# Patient Record
Sex: Male | Born: 1935 | Race: Black or African American | Hispanic: No | Marital: Married | Smoking: Former smoker
Health system: Southern US, Community
[De-identification: ages and names within clinical notes are randomized; demographics above are authoritative.]

## PROBLEM LIST (undated history)

## (undated) DIAGNOSIS — I10 Essential (primary) hypertension: Secondary | ICD-10-CM

## (undated) DIAGNOSIS — M1990 Unspecified osteoarthritis, unspecified site: Secondary | ICD-10-CM

## (undated) DIAGNOSIS — N179 Acute kidney failure, unspecified: Secondary | ICD-10-CM

## (undated) DIAGNOSIS — R4182 Altered mental status, unspecified: Secondary | ICD-10-CM

## (undated) DIAGNOSIS — K635 Polyp of colon: Secondary | ICD-10-CM

## (undated) DIAGNOSIS — N529 Male erectile dysfunction, unspecified: Secondary | ICD-10-CM

## (undated) DIAGNOSIS — Z9109 Other allergy status, other than to drugs and biological substances: Secondary | ICD-10-CM

## (undated) DIAGNOSIS — Z5189 Encounter for other specified aftercare: Secondary | ICD-10-CM

## (undated) DIAGNOSIS — H409 Unspecified glaucoma: Secondary | ICD-10-CM

## (undated) HISTORY — PX: GLAUCOMA VALVE INSERTION: SHX5297

## (undated) HISTORY — PX: EYE SURGERY: SHX253

## (undated) HISTORY — PX: TOENAIL EXCISION: SHX183

## (undated) HISTORY — PX: JOINT REPLACEMENT: SHX530

## (undated) HISTORY — PX: CATARACT EXTRACTION: SUR2

---

## 1999-02-26 ENCOUNTER — Encounter: Payer: Self-pay | Admitting: Family Medicine

## 1999-02-26 ENCOUNTER — Ambulatory Visit (HOSPITAL_COMMUNITY): Admission: RE | Admit: 1999-02-26 | Discharge: 1999-02-26 | Payer: Self-pay | Admitting: Family Medicine

## 1999-03-05 ENCOUNTER — Ambulatory Visit (HOSPITAL_COMMUNITY): Admission: RE | Admit: 1999-03-05 | Discharge: 1999-03-05 | Payer: Self-pay | Admitting: Family Medicine

## 1999-03-05 ENCOUNTER — Encounter: Payer: Self-pay | Admitting: Family Medicine

## 2001-08-11 ENCOUNTER — Ambulatory Visit (HOSPITAL_BASED_OUTPATIENT_CLINIC_OR_DEPARTMENT_OTHER): Admission: RE | Admit: 2001-08-11 | Discharge: 2001-08-11 | Payer: Self-pay | Admitting: Orthopedic Surgery

## 2003-11-04 DIAGNOSIS — IMO0001 Reserved for inherently not codable concepts without codable children: Secondary | ICD-10-CM

## 2003-11-04 DIAGNOSIS — Z5189 Encounter for other specified aftercare: Secondary | ICD-10-CM

## 2003-11-04 HISTORY — PX: HIP ARTHROPLASTY: SHX981

## 2003-11-04 HISTORY — DX: Reserved for inherently not codable concepts without codable children: IMO0001

## 2003-11-04 HISTORY — DX: Encounter for other specified aftercare: Z51.89

## 2004-01-04 ENCOUNTER — Ambulatory Visit (HOSPITAL_COMMUNITY): Admission: RE | Admit: 2004-01-04 | Discharge: 2004-01-04 | Payer: Self-pay | Admitting: Family Medicine

## 2004-05-30 ENCOUNTER — Ambulatory Visit (HOSPITAL_COMMUNITY): Admission: RE | Admit: 2004-05-30 | Discharge: 2004-05-30 | Payer: Self-pay | Admitting: Family Medicine

## 2004-07-22 ENCOUNTER — Inpatient Hospital Stay (HOSPITAL_COMMUNITY): Admission: RE | Admit: 2004-07-22 | Discharge: 2004-07-24 | Payer: Self-pay | Admitting: Orthopedic Surgery

## 2010-11-03 HISTORY — PX: MULTIPLE TOOTH EXTRACTIONS: SHX2053

## 2011-11-25 ENCOUNTER — Other Ambulatory Visit: Payer: Self-pay | Admitting: Orthopedic Surgery

## 2011-12-02 ENCOUNTER — Encounter (HOSPITAL_COMMUNITY): Payer: Self-pay | Admitting: Pharmacy Technician

## 2011-12-08 ENCOUNTER — Encounter (HOSPITAL_COMMUNITY)
Admission: RE | Admit: 2011-12-08 | Discharge: 2011-12-08 | Disposition: A | Discharge status: Home / Self Care | Payer: Medicare Other | Source: Ambulatory Visit | Attending: Orthopedic Surgery | Admitting: Orthopedic Surgery

## 2011-12-08 ENCOUNTER — Encounter (HOSPITAL_COMMUNITY): Payer: Self-pay

## 2011-12-08 HISTORY — DX: Male erectile dysfunction, unspecified: N52.9

## 2011-12-08 HISTORY — DX: Other allergy status, other than to drugs and biological substances: Z91.09

## 2011-12-08 HISTORY — DX: Unspecified osteoarthritis, unspecified site: M19.90

## 2011-12-08 HISTORY — DX: Polyp of colon: K63.5

## 2011-12-08 HISTORY — DX: Unspecified glaucoma: H40.9

## 2011-12-08 HISTORY — DX: Encounter for other specified aftercare: Z51.89

## 2011-12-08 LAB — URINALYSIS, ROUTINE W REFLEX MICROSCOPIC
Glucose, UA: NEGATIVE mg/dL
Leukocytes, UA: NEGATIVE
Protein, ur: NEGATIVE mg/dL
Specific Gravity, Urine: 1.025 (ref 1.005–1.030)
Urobilinogen, UA: 0.2 mg/dL (ref 0.0–1.0)

## 2011-12-08 LAB — SURGICAL PCR SCREEN
MRSA, PCR: NEGATIVE
Staphylococcus aureus: POSITIVE — AB

## 2011-12-08 LAB — CBC
HCT: 44.7 % (ref 39.0–52.0)
Hemoglobin: 14.6 g/dL (ref 13.0–17.0)
MCH: 29.5 pg (ref 26.0–34.0)
MCHC: 32.7 g/dL (ref 30.0–36.0)
MCV: 90.3 fL (ref 78.0–100.0)
RDW: 13.3 % (ref 11.5–15.5)

## 2011-12-08 LAB — TYPE AND SCREEN: ABO/RH(D): B POS

## 2011-12-08 LAB — BASIC METABOLIC PANEL
BUN: 9 mg/dL (ref 6–23)
CO2: 23 mEq/L (ref 19–32)
Chloride: 108 mEq/L (ref 96–112)
Creatinine, Ser: 0.82 mg/dL (ref 0.50–1.35)
GFR calc Af Amer: >90 mL/min (ref >90)
Glucose, Bld: 94 mg/dL (ref 70–99)
Potassium: 4.5 mEq/L (ref 3.5–5.1)

## 2011-12-08 LAB — DIFFERENTIAL
Basophils Absolute: 0 10*3/uL (ref 0.0–0.1)
Basophils Relative: 1 % (ref 0–1)
Eosinophils Relative: 8 % — ABNORMAL HIGH (ref 0–5)
Monocytes Absolute: 0.6 10*3/uL (ref 0.1–1.0)
Monocytes Relative: 9 % (ref 3–12)
Neutro Abs: 3.5 10*3/uL (ref 1.7–7.7)

## 2011-12-08 NOTE — Pre-Procedure Instructions (Signed)
20 RODRICKUS MIN  12/08/2011   Your procedure is scheduled on:  *Monday, Feb 11**  Report to [facility] at *0900** AM.  Call this number if you have problems the morning of surgery: [phone]   Remember:   Do not eat food:After Midnight.  May have clear liquids: up to 4 Hours before arrival.  Clear liquids include soda, tea, black coffee, apple or grape juice, broth.  Take these medicines the morning of surgery with A SIP OF WATER: *Eye drops,Baclofen,Parafon,Neurontin,Hydrocodone**   Do not wear jewelry, make-up or nail polish.  Do not wear lotions, powders, or perfumes. You may wear deodorant.  Do not shave 48 hours prior to surgery.  Do not bring valuables to the hospital.  Contacts, dentures or bridgework may not be worn into surgery.  Leave suitcase in the car. After surgery it may be brought to your room.  For patients admitted to the hospital, checkout time is 11:00 AM the day of discharge.   Patients discharged the day of surgery will not be allowed to drive home.  Name and phone number of your driver: *n/a**  Special Instructions: Incentive Spirometry - Practice and bring it with you on the day of surgery. and CHG Shower Use Special Wash: 1/2 bottle night before surgery and 1/2 bottle morning of surgery.   Please read over the following fact sheets that you were given: Pain Booklet, Coughing and Deep Breathing, Blood Transfusion Information, Total Joint Packet, MRSA Information and Surgical Site Infection Prevention

## 2011-12-12 NOTE — H&P (Signed)
  HISTORY OF PRESENT ILLNESS:  Mr. Nathaniel [name] comes in today reporting that the Supartz injections into his right knee have really not helped him.  He is pretty much at his wits end and he would like to go ahead and proceed with right total knee arthroplasty for his valgus deformity that is bone-on-bone and is documented by x-rays that were done over a year ago.  His revision right total hip continues to perform well and causes him no discomfort at all.  PAST MEDICAL HISTORY: HTN, BPH, arthritis PSH: THA Family Hx: Non-contributory Social Hx: Denies EtOH or tobacco  Review of systems reviewed thoroughly and all other systems are negative as related to the chief complaint.  OBJECTIVE:  Well-nourished, well-developed 77 year old gentleman seated on the exam table in no apparent distress.  No shortness of breath.  The right knee has an obvious valgus deformity of about ten degrees, 1+ effusion, tender along the lateral joint line.  Range of motion is 5 to about 115 degrees, and he remains neurovascularly intact.  I did not repeat his x-rays.  IMPRESSION:  End-stage arthritis of the right knee having failed conservative measures over the last few years including exercise, physical therapy, cortisone injections, anti-inflammatory medicines, and most recently visco supplementation.  PLAN:  Per his request, we will get him set up for right total knee arthroplasty.  Risks and benefits of surgery was discussed.  Questions were answered and I will see him back at the time of surgical intervention.

## 2011-12-14 MED ORDER — DEXTROSE-NACL 5-0.45 % IV SOLN: INTRAVENOUS | Status: DC

## 2011-12-14 MED ORDER — CHLORHEXIDINE GLUCONATE 4 % EX LIQD
60.0000 mL | Freq: Once | CUTANEOUS | Status: DC
  Filled 2011-12-14: qty 60

## 2011-12-14 MED ORDER — CEFAZOLIN SODIUM 1-5 GM-% IV SOLN
1.0000 g | INTRAVENOUS | Status: AC
  Administered 2011-12-15: 1 g via INTRAVENOUS
  Filled 2011-12-14: qty 50

## 2011-12-15 ENCOUNTER — Encounter (HOSPITAL_COMMUNITY): Payer: Self-pay | Admitting: Anesthesiology

## 2011-12-15 ENCOUNTER — Encounter (HOSPITAL_COMMUNITY): Payer: Self-pay | Admitting: *Deleted

## 2011-12-15 ENCOUNTER — Other Ambulatory Visit: Payer: Self-pay

## 2011-12-15 ENCOUNTER — Inpatient Hospital Stay (HOSPITAL_COMMUNITY)
Admission: RE | Admit: 2011-12-15 | Discharge: 2011-12-19 | DRG: 470 | Disposition: A | Discharge status: Home Health | Payer: Medicare Other | Source: Ambulatory Visit | Attending: Orthopedic Surgery | Admitting: Orthopedic Surgery

## 2011-12-15 ENCOUNTER — Encounter (HOSPITAL_COMMUNITY)
Admission: RE | Disposition: A | Discharge status: Home Health | Payer: Self-pay | Source: Ambulatory Visit | Attending: Orthopedic Surgery

## 2011-12-15 ENCOUNTER — Encounter (HOSPITAL_COMMUNITY): Payer: Self-pay

## 2011-12-15 ENCOUNTER — Ambulatory Visit (HOSPITAL_COMMUNITY): Payer: Medicare Other | Admitting: Anesthesiology

## 2011-12-15 DIAGNOSIS — Z01812 Encounter for preprocedural laboratory examination: Secondary | ICD-10-CM

## 2011-12-15 DIAGNOSIS — N400 Enlarged prostate without lower urinary tract symptoms: Secondary | ICD-10-CM | POA: Diagnosis present

## 2011-12-15 DIAGNOSIS — I10 Essential (primary) hypertension: Secondary | ICD-10-CM | POA: Diagnosis present

## 2011-12-15 DIAGNOSIS — Z96649 Presence of unspecified artificial hip joint: Secondary | ICD-10-CM

## 2011-12-15 DIAGNOSIS — Z79899 Other long term (current) drug therapy: Secondary | ICD-10-CM

## 2011-12-15 DIAGNOSIS — M1710 Unilateral primary osteoarthritis, unspecified knee: Principal | ICD-10-CM | POA: Diagnosis present

## 2011-12-15 DIAGNOSIS — Z860100 Personal history of colon polyps, unspecified: Secondary | ICD-10-CM

## 2011-12-15 DIAGNOSIS — M129 Arthropathy, unspecified: Secondary | ICD-10-CM | POA: Diagnosis present

## 2011-12-15 DIAGNOSIS — M1711 Unilateral primary osteoarthritis, right knee: Secondary | ICD-10-CM | POA: Diagnosis present

## 2011-12-15 DIAGNOSIS — H548 Legal blindness, as defined in USA: Secondary | ICD-10-CM | POA: Diagnosis present

## 2011-12-15 DIAGNOSIS — H409 Unspecified glaucoma: Secondary | ICD-10-CM | POA: Diagnosis present

## 2011-12-15 HISTORY — PX: TOTAL KNEE ARTHROPLASTY: SHX125

## 2011-12-15 SURGERY — ARTHROPLASTY, KNEE, TOTAL
Anesthesia: General | Site: Knee | Laterality: Right | Wound class: Clean

## 2011-12-15 MED ORDER — MENTHOL 3 MG MT LOZG: 1.0000 | LOZENGE | OROMUCOSAL | Status: DC | PRN

## 2011-12-15 MED ORDER — MAGNESIUM HYDROXIDE 400 MG/5ML PO SUSP
30.0000 mL | Freq: Every day | ORAL | Status: DC | PRN
  Administered 2011-12-18: 30 mL via ORAL
  Filled 2011-12-15: qty 30

## 2011-12-15 MED ORDER — METOCLOPRAMIDE HCL 5 MG/ML IJ SOLN
5.0000 mg | Freq: Three times a day (TID) | INTRAMUSCULAR | Status: DC | PRN
  Filled 2011-12-15: qty 2

## 2011-12-15 MED ORDER — FENTANYL CITRATE 0.05 MG/ML IJ SOLN
50.0000 ug | INTRAMUSCULAR | Status: DC | PRN
  Administered 2011-12-15: 100 ug via INTRAVENOUS

## 2011-12-15 MED ORDER — ZOLPIDEM TARTRATE 5 MG PO TABS: 5.0000 mg | ORAL_TABLET | Freq: Every evening | ORAL | Status: DC | PRN

## 2011-12-15 MED ORDER — ALUM & MAG HYDROXIDE-SIMETH 200-200-20 MG/5ML PO SUSP: 30.0000 mL | ORAL | Status: DC | PRN

## 2011-12-15 MED ORDER — FLEET ENEMA 7-19 GM/118ML RE ENEM: 1.0000 | ENEMA | Freq: Once | RECTAL | Status: AC | PRN

## 2011-12-15 MED ORDER — HYDROCODONE-ACETAMINOPHEN 5-325 MG PO TABS
1.0000 | ORAL_TABLET | ORAL | Status: DC | PRN
  Administered 2011-12-15 – 2011-12-16 (×3): 2 via ORAL
  Administered 2011-12-17: 1 via ORAL
  Administered 2011-12-18 (×2): 2 via ORAL
  Administered 2011-12-18: 1 via ORAL
  Administered 2011-12-18 – 2011-12-19 (×2): 2 via ORAL
  Filled 2011-12-15: qty 2
  Filled 2011-12-15: qty 1
  Filled 2011-12-15 (×3): qty 2
  Filled 2011-12-15: qty 1
  Filled 2011-12-15 (×4): qty 2

## 2011-12-15 MED ORDER — MIDAZOLAM HCL 2 MG/2ML IJ SOLN
INTRAMUSCULAR | Status: AC
  Filled 2011-12-15: qty 2

## 2011-12-15 MED ORDER — DORZOLAMIDE HCL-TIMOLOL MAL 2-0.5 % OP SOLN
1.0000 [drp] | Freq: Two times a day (BID) | OPHTHALMIC | Status: DC
  Administered 2011-12-15 – 2011-12-19 (×8): 1 [drp] via OPHTHALMIC
  Filled 2011-12-15: qty 10

## 2011-12-15 MED ORDER — LORATADINE 10 MG PO TABS
10.0000 mg | ORAL_TABLET | Freq: Every day | ORAL | Status: DC
  Administered 2011-12-16 – 2011-12-19 (×4): 10 mg via ORAL
  Filled 2011-12-15 (×4): qty 1

## 2011-12-15 MED ORDER — METOCLOPRAMIDE HCL 10 MG PO TABS: 5.0000 mg | ORAL_TABLET | Freq: Three times a day (TID) | ORAL | Status: DC | PRN

## 2011-12-15 MED ORDER — DIPHENHYDRAMINE HCL 12.5 MG/5ML PO ELIX
12.5000 mg | ORAL_SOLUTION | ORAL | Status: DC | PRN
  Filled 2011-12-15: qty 10

## 2011-12-15 MED ORDER — GABAPENTIN 400 MG PO CAPS
800.0000 mg | ORAL_CAPSULE | Freq: Four times a day (QID) | ORAL | Status: DC
  Administered 2011-12-15 – 2011-12-19 (×11): 800 mg via ORAL
  Filled 2011-12-15 (×18): qty 2

## 2011-12-15 MED ORDER — LACTATED RINGERS IV SOLN
INTRAVENOUS | Status: DC
  Administered 2011-12-15: 10:00:00 via INTRAVENOUS

## 2011-12-15 MED ORDER — GABAPENTIN 400 MG PO CAPS
800.0000 mg | ORAL_CAPSULE | Freq: Four times a day (QID) | ORAL | Status: DC
  Filled 2011-12-15 (×2): qty 2

## 2011-12-15 MED ORDER — PROPOFOL 10 MG/ML IV EMUL
INTRAVENOUS | Status: DC | PRN
  Administered 2011-12-15: 100 mg via INTRAVENOUS

## 2011-12-15 MED ORDER — NEOSTIGMINE METHYLSULFATE 1 MG/ML IJ SOLN
INTRAMUSCULAR | Status: DC | PRN
  Administered 2011-12-15: 4 mg via INTRAVENOUS

## 2011-12-15 MED ORDER — ACETAMINOPHEN 650 MG RE SUPP: 650.0000 mg | Freq: Four times a day (QID) | RECTAL | Status: DC | PRN

## 2011-12-15 MED ORDER — ONDANSETRON HCL 4 MG/2ML IJ SOLN
INTRAMUSCULAR | Status: DC | PRN
  Administered 2011-12-15: 4 mg via INTRAVENOUS

## 2011-12-15 MED ORDER — FENTANYL CITRATE 0.05 MG/ML IJ SOLN
INTRAMUSCULAR | Status: DC | PRN
  Administered 2011-12-15: 50 ug via INTRAVENOUS
  Administered 2011-12-15: 100 ug via INTRAVENOUS
  Administered 2011-12-15 (×2): 50 ug via INTRAVENOUS

## 2011-12-15 MED ORDER — ONDANSETRON HCL 4 MG/2ML IJ SOLN: 4.0000 mg | Freq: Once | INTRAMUSCULAR | Status: DC | PRN

## 2011-12-15 MED ORDER — HYDROMORPHONE HCL PF 1 MG/ML IJ SOLN
0.2500 mg | INTRAMUSCULAR | Status: DC | PRN
  Administered 2011-12-15 (×4): 0.5 mg via INTRAVENOUS

## 2011-12-15 MED ORDER — CHLORZOXAZONE 500 MG PO TABS
500.0000 mg | ORAL_TABLET | Freq: Three times a day (TID) | ORAL | Status: DC | PRN
  Filled 2011-12-15: qty 1

## 2011-12-15 MED ORDER — ENOXAPARIN SODIUM 30 MG/0.3ML ~~LOC~~ SOLN
30.0000 mg | Freq: Two times a day (BID) | SUBCUTANEOUS | Status: DC
  Administered 2011-12-15 – 2011-12-19 (×8): 30 mg via SUBCUTANEOUS
  Filled 2011-12-15 (×9): qty 0.3

## 2011-12-15 MED ORDER — MEPERIDINE HCL 25 MG/ML IJ SOLN: 6.2500 mg | INTRAMUSCULAR | Status: DC | PRN

## 2011-12-15 MED ORDER — BISACODYL 10 MG RE SUPP
10.0000 mg | Freq: Every day | RECTAL | Status: DC | PRN
  Filled 2011-12-15: qty 1

## 2011-12-15 MED ORDER — HYDROMORPHONE HCL PF 1 MG/ML IJ SOLN
INTRAMUSCULAR | Status: AC
  Administered 2011-12-15: 1 mg via INTRAVENOUS
  Filled 2011-12-15: qty 1

## 2011-12-15 MED ORDER — BRIMONIDINE TARTRATE 0.15 % OP SOLN
1.0000 [drp] | Freq: Every day | OPHTHALMIC | Status: DC
Start: 2011-12-16 — End: 2011-12-16
  Filled 2011-12-15: qty 5

## 2011-12-15 MED ORDER — ONDANSETRON HCL 4 MG/2ML IJ SOLN: 4.0000 mg | Freq: Four times a day (QID) | INTRAMUSCULAR | Status: DC | PRN

## 2011-12-15 MED ORDER — WARFARIN VIDEO: Freq: Once | Status: DC

## 2011-12-15 MED ORDER — MIDAZOLAM HCL 2 MG/2ML IJ SOLN
1.0000 mg | INTRAMUSCULAR | Status: DC | PRN
  Administered 2011-12-15: 1 mg via INTRAVENOUS

## 2011-12-15 MED ORDER — HYDROMORPHONE HCL PF 1 MG/ML IJ SOLN
0.5000 mg | INTRAMUSCULAR | Status: DC | PRN
  Administered 2011-12-15 – 2011-12-17 (×6): 1 mg via INTRAVENOUS
  Filled 2011-12-15 (×6): qty 1

## 2011-12-15 MED ORDER — KCL IN DEXTROSE-NACL 20-5-0.45 MEQ/L-%-% IV SOLN
INTRAVENOUS | Status: DC
  Administered 2011-12-15 – 2011-12-16 (×2): via INTRAVENOUS
  Administered 2011-12-16: 1000 mL via INTRAVENOUS
  Administered 2011-12-17: 02:00:00 via INTRAVENOUS
  Filled 2011-12-15 (×14): qty 1000

## 2011-12-15 MED ORDER — FENTANYL CITRATE 0.05 MG/ML IJ SOLN
INTRAMUSCULAR | Status: AC
  Filled 2011-12-15: qty 2

## 2011-12-15 MED ORDER — GLYCOPYRROLATE 0.2 MG/ML IJ SOLN
INTRAMUSCULAR | Status: DC | PRN
  Administered 2011-12-15: .6 mg via INTRAVENOUS

## 2011-12-15 MED ORDER — MORPHINE SULFATE 2 MG/ML IJ SOLN: 0.0500 mg/kg | INTRAMUSCULAR | Status: DC | PRN

## 2011-12-15 MED ORDER — LABETALOL HCL 5 MG/ML IV SOLN
INTRAVENOUS | Status: AC
  Filled 2011-12-15: qty 4

## 2011-12-15 MED ORDER — PHENOL 1.4 % MT LIQD
1.0000 | OROMUCOSAL | Status: DC | PRN
  Filled 2011-12-15: qty 177

## 2011-12-15 MED ORDER — ACETAMINOPHEN 325 MG PO TABS
650.0000 mg | ORAL_TABLET | Freq: Four times a day (QID) | ORAL | Status: DC | PRN
  Administered 2011-12-16: 650 mg via ORAL
  Filled 2011-12-15: qty 2

## 2011-12-15 MED ORDER — BACLOFEN 10 MG PO TABS
10.0000 mg | ORAL_TABLET | Freq: Four times a day (QID) | ORAL | Status: DC
  Administered 2011-12-15 – 2011-12-19 (×15): 10 mg via ORAL
  Filled 2011-12-15 (×18): qty 1

## 2011-12-15 MED ORDER — LACTATED RINGERS IV SOLN
INTRAVENOUS | Status: DC | PRN
  Administered 2011-12-15 (×2): via INTRAVENOUS

## 2011-12-15 MED ORDER — LABETALOL HCL 5 MG/ML IV SOLN
5.0000 mg | INTRAVENOUS | Status: DC | PRN
  Administered 2011-12-15 (×3): 5 mg via INTRAVENOUS

## 2011-12-15 MED ORDER — OXYCODONE-ACETAMINOPHEN 5-325 MG PO TABS
1.0000 | ORAL_TABLET | ORAL | Status: DC | PRN
  Administered 2011-12-16: 1 via ORAL
  Administered 2011-12-17 – 2011-12-19 (×5): 2 via ORAL
  Filled 2011-12-15 (×5): qty 2
  Filled 2011-12-15: qty 1

## 2011-12-15 MED ORDER — ROCURONIUM BROMIDE 100 MG/10ML IV SOLN
INTRAVENOUS | Status: DC | PRN
  Administered 2011-12-15: 50 mg via INTRAVENOUS

## 2011-12-15 MED ORDER — WARFARIN SODIUM 7.5 MG PO TABS
7.5000 mg | ORAL_TABLET | Freq: Once | ORAL | Status: AC
  Administered 2011-12-15: 7.5 mg via ORAL
  Filled 2011-12-15: qty 1

## 2011-12-15 MED ORDER — TRAVOPROST (BAK FREE) 0.004 % OP SOLN
1.0000 [drp] | Freq: Every day | OPHTHALMIC | Status: DC
  Administered 2011-12-15 – 2011-12-18 (×4): 1 [drp] via OPHTHALMIC
  Filled 2011-12-15: qty 2.5

## 2011-12-15 MED ORDER — CEFUROXIME SODIUM 1.5 G IJ SOLR
INTRAMUSCULAR | Status: DC | PRN
  Administered 2011-12-15: 1.5 g

## 2011-12-15 MED ORDER — PATIENT'S GUIDE TO USING COUMADIN BOOK
Freq: Once | Status: DC
  Filled 2011-12-15: qty 1

## 2011-12-15 MED ORDER — GABAPENTIN 800 MG PO TABS
800.0000 mg | ORAL_TABLET | Freq: Four times a day (QID) | ORAL | Status: DC
  Filled 2011-12-15 (×3): qty 1

## 2011-12-15 MED ORDER — ONDANSETRON HCL 4 MG PO TABS: 4.0000 mg | ORAL_TABLET | Freq: Four times a day (QID) | ORAL | Status: DC | PRN

## 2011-12-15 SURGICAL SUPPLY — 58 items
BANDAGE ELASTIC 6 VELCRO ST LF (GAUZE/BANDAGES/DRESSINGS) ×2 IMPLANT
BANDAGE ESMARK 6X9 LF (GAUZE/BANDAGES/DRESSINGS) ×1 IMPLANT
BLADE SAG 18X100X1.27 (BLADE) ×2 IMPLANT
BLADE SAW SGTL 13X75X1.27 (BLADE) ×2 IMPLANT
BLADE SURG ROTATE 9660 (MISCELLANEOUS) IMPLANT
BNDG CMPR MED 10X6 ELC LF (GAUZE/BANDAGES/DRESSINGS) ×1
BNDG ELASTIC 6X10 VLCR STRL LF (GAUZE/BANDAGES/DRESSINGS) ×2 IMPLANT
BNDG ESMARK 6X9 LF (GAUZE/BANDAGES/DRESSINGS) ×2
BOWL SMART MIX CTS (DISPOSABLE) ×2 IMPLANT
CEMENT HV SMART SET (Cement) ×4 IMPLANT
CLOTH BEACON ORANGE TIMEOUT ST (SAFETY) ×2 IMPLANT
COVER BACK TABLE 24X17X13 BIG (DRAPES) IMPLANT
COVER SURGICAL LIGHT HANDLE (MISCELLANEOUS) ×2 IMPLANT
CUFF TOURNIQUET SINGLE 34IN LL (TOURNIQUET CUFF) IMPLANT
CUFF TOURNIQUET SINGLE 44IN (TOURNIQUET CUFF) IMPLANT
DRAPE EXTREMITY T 121X128X90 (DRAPE) ×2 IMPLANT
DRAPE U-SHAPE 47X51 STRL (DRAPES) ×2 IMPLANT
DRSG PAD ABDOMINAL 8X10 ST (GAUZE/BANDAGES/DRESSINGS) ×2 IMPLANT
DURAPREP 26ML APPLICATOR (WOUND CARE) ×2 IMPLANT
ELECT REM PT RETURN 9FT ADLT (ELECTROSURGICAL) ×2
ELECTRODE REM PT RTRN 9FT ADLT (ELECTROSURGICAL) ×1 IMPLANT
EVACUATOR 1/8 PVC DRAIN (DRAIN) ×2 IMPLANT
GAUZE SPONGE 4X4 12PLY STRL LF (GAUZE/BANDAGES/DRESSINGS) ×2 IMPLANT
GAUZE XEROFORM 1X8 LF (GAUZE/BANDAGES/DRESSINGS) ×2 IMPLANT
GLOVE BIO SURGEON STRL SZ7 (GLOVE) ×2 IMPLANT
GLOVE BIO SURGEON STRL SZ7.5 (GLOVE) ×2 IMPLANT
GLOVE BIOGEL PI IND STRL 7.0 (GLOVE) ×1 IMPLANT
GLOVE BIOGEL PI IND STRL 8 (GLOVE) ×1 IMPLANT
GLOVE BIOGEL PI INDICATOR 7.0 (GLOVE) ×1
GLOVE BIOGEL PI INDICATOR 8 (GLOVE) ×1
GOWN PREVENTION PLUS XLARGE (GOWN DISPOSABLE) ×2 IMPLANT
GOWN STRL NON-REIN LRG LVL3 (GOWN DISPOSABLE) ×4 IMPLANT
HANDPIECE INTERPULSE COAX TIP (DISPOSABLE) ×1
HOOD PEEL AWAY FACE SHEILD DIS (HOOD) ×6 IMPLANT
KIT BASIN OR (CUSTOM PROCEDURE TRAY) ×2 IMPLANT
KIT ROOM TURNOVER OR (KITS) ×2 IMPLANT
MANIFOLD NEPTUNE II (INSTRUMENTS) ×2 IMPLANT
NS IRRIG 1000ML POUR BTL (IV SOLUTION) ×2 IMPLANT
PACK TOTAL JOINT (CUSTOM PROCEDURE TRAY) ×2 IMPLANT
PAD ARMBOARD 7.5X6 YLW CONV (MISCELLANEOUS) ×4 IMPLANT
PADDING CAST COTTON 6X4 STRL (CAST SUPPLIES) ×2 IMPLANT
PADDING WEBRIL 4 STERILE (GAUZE/BANDAGES/DRESSINGS) ×2 IMPLANT
PADDING WEBRIL 6 STERILE (GAUZE/BANDAGES/DRESSINGS) ×2 IMPLANT
SET HNDPC FAN SPRY TIP SCT (DISPOSABLE) ×1 IMPLANT
SPONGE GAUZE 4X4 12PLY (GAUZE/BANDAGES/DRESSINGS) ×4 IMPLANT
STAPLER VISISTAT 35W (STAPLE) ×2 IMPLANT
SUCTION FRAZIER TIP 10 FR DISP (SUCTIONS) ×2 IMPLANT
SURGIFLO TRUKIT (HEMOSTASIS) IMPLANT
SUT VIC AB 0 CTX 36 (SUTURE) ×1
SUT VIC AB 0 CTX36XBRD ANTBCTR (SUTURE) ×1 IMPLANT
SUT VIC AB 1 CTX 36 (SUTURE) ×1
SUT VIC AB 1 CTX36XBRD ANBCTR (SUTURE) ×1 IMPLANT
SUT VIC AB 2-0 CT1 27 (SUTURE) ×1
SUT VIC AB 2-0 CT1 TAPERPNT 27 (SUTURE) ×1 IMPLANT
TOWEL OR 17X24 6PK STRL BLUE (TOWEL DISPOSABLE) ×2 IMPLANT
TOWEL OR 17X26 10 PK STRL BLUE (TOWEL DISPOSABLE) ×2 IMPLANT
TRAY FOLEY CATH 14FR (SET/KITS/TRAYS/PACK) IMPLANT
WATER STERILE IRR 1000ML POUR (IV SOLUTION) ×6 IMPLANT

## 2011-12-15 NOTE — Op Note (Signed)
PATIENT ID:      Nathaniel [name]  MRN:     [id number] DOB/AGE:    75-07-1935 / 75 y.o.       OPERATIVE REPORT    DATE OF PROCEDURE:  12/15/2011       PREOPERATIVE DIAGNOSIS:   OSTEOARTHRITIS RIGHT KNEE      There is no height or weight on file to calculate BMI.                                                        POSTOPERATIVE DIAGNOSIS:   OSTEOARTHRITIS RIGHT KNEE                                                                      PROCEDURE:  Procedure(s): TOTAL KNEE ARTHROPLASTY Using Depuy Sigma RP implants #5 Femur, #5Tibia, 10mm sigma RP bearing, 41 Patella     SURGEON: Serigne Kubicek J    ASSISTANT:   Shirl Harris PA-C   (Present and scrubbed throughout the case, critical for assistance with exposure, retraction, instrumentation, and closure.)         ANESTHESIA: GA combined with regional for post-op pain   DRAINS: (2) Hemovact drain(s) in the Rknee with  Suction Open and Urinary Catheter (Foley)   TOURNIQUET TIME: 75  COMPLICATIONS:  None     SPECIMENS: None   INDICATIONS FOR PROCEDURE: The patient has  OSTEOARTHRITIS RIGHT KNEE, varus deformities, XR shows bone on bone arthritis. Patient has failed all conservative measures including anti-inflammatory medicines, narcotics, attempts at  exercise and weight loss, cortisone injections and viscosupplementation.  Risks and benefits of surgery have been discussed, questions answered.   DESCRIPTION OF PROCEDURE: The patient identified by armband, received  right femoral nerve block and IV antibiotics, in the holding area at [facility]. Patient taken to the operating room, appropriate anesthetic  monitors were attached General endotracheal anesthesia induced with  the patient in supine position, Foley catheter was inserted. Tourniquet  applied high to the operative thigh. Lateral post and foot positioner  applied to the table, the lower extremity was then prepped and draped  in usual sterile fashion from the ankle to the  tourniquet. Time-out procedure was performed. The limb was wrapped with an Esmarch bandage and the tourniquet inflated to 350 mmHg. We began the operation by making the anterior midline incision starting at  handbreadth above the patella going over the patella 1 cm medial to and  4 cm distal to the tibial tubercle. Small bleeders in the skin and the  subcutaneous tissue identified and cauterized. Transverse retinaculum was incised and reflected medially and a medial parapatellar arthrotomy was accomplished. the patella was everted and theprepatellar fat pad resected. The superficial medial collateral  ligament was then elevated from anterior to posterior along the proximal  flare of the tibia and anterior half of the menisci resected. The knee was hyperflexed exposing bone on bone arthritis. Peripheral and notch osteophytes as well as the cruciate ligaments were then resected. We continued to  work our way around posteriorly along the proximal tibia, and externally  rotated the tibia subluxing it out from underneath the femur. A McHale  retractor was placed through the notch and a lateral Hohmann retractor  placed, and we then drilled through the proximal tibia in line with the  axis of the tibia followed by an intramedullary guide rod and 2-degree  posterior slope cutting guide. The tibial cutting guide was pinned into place  allowing resection of 8 mm of bone medially and about 8 mm of bone  laterally because of her varus deformity. Satisfied with the tibial resection, we then  entered the distal femur 2 mm anterior to the PCL origin with the  intramedullary guide rod and applied the distal femoral cutting guide  set at 11mm, with 5 degrees of valgus. This was pinned along the  epicondylar axis. At this point, the distal femoral cut was accomplished without difficulty. We then sized for a #5 femoral component and pinned the guide in 3 degrees of external rotation.The chamfer cutting guide was  pinned into place. The anterior, posterior, and chamfer cuts were accomplished without difficulty followed by  the Sigma RP box cutting guide and the box cut. We also removed posterior osteophytes from the posterior femoral condyles. At this  time, the knee was brought into full extension. We checked our  extension and flexion gaps and found them symmetric at 10mm.  The patella thickness measured at 27 mm. We felt a 41 patella would  fit. We set the cutting guide at 15 and removed the posterior 11 mm  of the patella sized for 41 button and drilled the lollipop. The knee  was then once again hyperflexed exposing the proximal tibia. We sized for a #5 tibial base plate, applied the smokestack and the conical reamer followed by the the Delta fin keel punch. We then hammered into place the Sigma RP trial femoral component, inserted a 10-mm trial bearing, trial patellar button, and took the knee through range of motion from 0-130 degrees. No thumb pressure was required for patellar  tracking. At this point, all trial components were removed, a double batch of DePuy HV cement with 1500 mg of Zinacef was mixed and applied to all bony metallic mating surfaces except for the posterior condyles of the femur itself. In order, we  hammered into place the tibial tray and removed excess cement, the femoral component and removed excess cement, a 10-mm Sigma RP bearing  was inserted, and the knee brought to full extension with compression.  The patellar button was clamped into place, and excess cement  removed. While the cement cured the wound was irrigated out with normal saline solution pulse lavage, and medium Hemovac drains were placed from an anterolateral  approach. Ligament stability and patellar tracking were checked and found to be excellent. The parapatellar arthrotomy was closed with  running #1 Vicryl suture. The subcutaneous tissue with 0 and 2-0 undyed  Vicryl suture, and the skin with skin staples. A  dressing of Xeroform,  4 x 4, dressing sponges, Webril, and Ace wrap applied. The patient  awakened, extubated, and taken to recovery room without difficulty.   Nathaniel [name] 12/15/2011, 12:52 PM

## 2011-12-15 NOTE — Progress Notes (Signed)
ANTICOAGULATION CONSULT NOTE - Initial Consult  Pharmacy Consult for Coumadin Indication: VTE prophylaxis  Allergies  Allergen Reactions  . Advil Hives    Conflict with other meds    Patient Measurements: Height: 5\' 11"  (180.3 cm) Weight: 174 lb 9.7 oz (79.2 kg) IBW/kg (Calculated) : 75.3   Vital Signs: Temp: 98.6 F (37 C) (02/11 1520) Temp src: Oral (02/11 1520) BP: 162/98 mmHg (02/11 1520) Pulse Rate: 65  (02/11 1520)  Labs: No results found for this basename: HGB:2,HCT:3,PLT:3,APTT:3,LABPROT:3,INR:3,HEPARINUNFRC:3,CREATININE:3,CKTOTAL:3,CKMB:3,TROPONINI:3 in the last 72 hours Estimated Creatinine Clearance: 84.2 ml/min (by C-G formula based on Cr of 0.82).  Medical History: Past Medical History  Diagnosis Date  . Arthritis   . Environmental allergies     pollen , leaves, grass  . Blood transfusion 2005    s/p hip replacement  . Glaucoma of left eye     legally blind in left eye  . Colon polyps   . Erectile dysfunction     Medications:  Scheduled:    . baclofen  10 mg Oral QID  . brimonidine  1 drop Both Eyes QAC breakfast  .  ceFAZolin (ANCEF) IV  1 g Intravenous 60 min Pre-Op  . dorzolamide-timolol  1 drop Both Eyes BID  . enoxaparin  30 mg Subcutaneous Q12H  . gabapentin  800 mg Oral QID  . HYDROmorphone      . labetalol      . loratadine  10 mg Oral Daily  . Travoprost (BAK Free)  1 drop Both Eyes QHS  . DISCONTD: chlorhexidine  60 mL Topical Once    Assessment: 76 YOM s/p total knee replacement, to start coumadin for VTE prophylaxis. Patient was not taking anticoagulants prior to admission. Baseline INR 0.98 on 12/08/2011. He is also on lovenox 30 mg q 12hrs  Goal of Therapy:  INR 2-3   Plan:  1. Coumadin 7.5mg  po x 1 2. F/u INR daily.  3. Coumadin video and education booklet 4. Will d/c Lovenox when INR > 2  Riki Rusk 12/15/2011,4:23 PM

## 2011-12-15 NOTE — Interval H&P Note (Signed)
History and Physical Interval Note:  12/15/2011 10:24 AM  Nathaniel [name]  has presented today for surgery, with the diagnosis of OSTEOARTHRITIS RIGHT KNEE  The various methods of treatment have been discussed with the patient and family. After consideration of risks, benefits and other options for treatment, the patient has consented to  Procedure(s): TOTAL KNEE ARTHROPLASTY as a surgical intervention .  The patients' history has been reviewed, patient examined, no change in status, stable for surgery.  I have reviewed the patients' chart and labs.  Questions were answered to the patient's satisfaction.     [name]

## 2011-12-15 NOTE — Anesthesia Preprocedure Evaluation (Signed)
Anesthesia Evaluation  Patient identified by MRN, date of birth, ID band Patient awake    Reviewed: Allergy & Precautions, H&P , NPO status , Patient's Chart, lab work & pertinent test results  Airway       Dental   Pulmonary          Cardiovascular     Neuro/Psych    GI/Hepatic   Endo/Other    Renal/GU      Musculoskeletal   Abdominal   Peds  Hematology   Anesthesia Other Findings   Reproductive/Obstetrics                           Anesthesia Physical Anesthesia Plan  ASA: II  Anesthesia Plan: General   Post-op Pain Management: MAC Combined w/ Regional for Post-op pain   Induction: Intravenous  Airway Management Planned: Oral ETT  Additional Equipment:   Intra-op Plan:   Post-operative Plan: Extubation in OR  Informed Consent: I have reviewed the patients History and Physical, chart, labs and discussed the procedure including the risks, benefits and alternatives for the proposed anesthesia with the patient or authorized representative who has indicated his/her understanding and acceptance.     Plan Discussed with: CRNA and Surgeon  Anesthesia Plan Comments:         Anesthesia Quick Evaluation

## 2011-12-15 NOTE — Anesthesia Postprocedure Evaluation (Signed)
Anesthesia Post Note  Patient: Nathaniel [name]  Procedure(s) Performed:  TOTAL KNEE ARTHROPLASTY - DEPUY SIGMA   Anesthesia type: general  Patient location: PACU  Post pain: Pain level controlled  Post assessment: Patient's Cardiovascular Status Stable  Last Vitals:  Filed Vitals:   12/15/11 1446  BP: 163/97  Pulse: 60  Temp:   Resp: 12    Post vital signs: Reviewed and stable  Level of consciousness: sedated  Complications: No apparent anesthesia complications

## 2011-12-15 NOTE — Anesthesia Procedure Notes (Addendum)
Anesthesia Regional Block:  Femoral nerve block  Pre-Anesthetic Checklist: ,, timeout performed, Correct Patient, Correct Site, Correct Laterality, Correct Procedure, Correct Position, site marked, Risks and benefits discussed,  Surgical consent,  Pre-op evaluation,  At surgeon's request and post-op pain management  Laterality: Right  Prep: chloraprep       Needles:  Injection technique: Single-shot  Needle Type: Echogenic Stimulator Needle     Needle Length: 10cm 10 cm     Additional Needles:  Procedures: ultrasound guided and nerve stimulator Femoral nerve block  Nerve Stimulator or Paresthesia:  Response: 0.4 mA,   Additional Responses:   Narrative:  Start time: 12/15/2011 10:15 AM End time: 12/15/2011 10:30 AM Injection made incrementally with aspirations every 5 mL.  Performed by: Personally  Anesthesiologist: Arta Bruce MD  Femoral nerve block Procedure Name: Intubation Date/Time: 12/15/2011 11:28 AM Performed by: Elizbeth Squires Pre-anesthesia Checklist: Patient identified, Suction available, Emergency Drugs available and Patient being monitored Patient Re-evaluated:Patient Re-evaluated prior to inductionOxygen Delivery Method: Circle System Utilized Preoxygenation: Pre-oxygenation with 100% oxygen Intubation Type: IV induction Ventilation: Mask ventilation without difficulty Laryngoscope Size: Mac and 3 Grade View: Grade I Tube type: Oral Tube size: 8.0 mm Number of attempts: 1 Airway Equipment and Method: stylet Placement Confirmation: ETT inserted through vocal cords under direct vision,  positive ETCO2 and breath sounds checked- equal and bilateral Secured at: 24 cm Tube secured with: Tape Dental Injury: Teeth and Oropharynx as per pre-operative assessment

## 2011-12-15 NOTE — Transfer of Care (Signed)
Immediate Anesthesia Transfer of Care Note  Patient: Nathaniel [name]  Procedure(s) Performed:  TOTAL KNEE ARTHROPLASTY - DEPUY SIGMA   Patient Location: PACU  Anesthesia Type: General  Level of Consciousness: awake, alert  and oriented  Airway & Oxygen Therapy: Patient Spontanous Breathing and Patient connected to nasal cannula oxygen  Post-op Assessment: Report given to PACU RN, Post -op Vital signs reviewed and stable, Patient moving all extremities and Patient able to stick tongue midline  Post vital signs: Reviewed and stable  Complications: No apparent anesthesia complications

## 2011-12-16 ENCOUNTER — Encounter (HOSPITAL_COMMUNITY): Payer: Self-pay | Admitting: Orthopedic Surgery

## 2011-12-16 LAB — BASIC METABOLIC PANEL
BUN: 9 mg/dL (ref 6–23)
CO2: 23 mEq/L (ref 19–32)
GFR calc non Af Amer: 87 mL/min — ABNORMAL LOW (ref >90)
Glucose, Bld: 145 mg/dL — ABNORMAL HIGH (ref 70–99)
Potassium: 3.9 mEq/L (ref 3.5–5.1)
Sodium: 135 mEq/L (ref 135–145)

## 2011-12-16 LAB — CBC
HCT: 34.5 % — ABNORMAL LOW (ref 39.0–52.0)
Hemoglobin: 11.4 g/dL — ABNORMAL LOW (ref 13.0–17.0)
MCH: 29.3 pg (ref 26.0–34.0)
MCHC: 33 g/dL (ref 30.0–36.0)
MCV: 88.7 fL (ref 78.0–100.0)
Platelets: 192 10*3/uL (ref 150–400)
RBC: 3.89 MIL/uL — ABNORMAL LOW (ref 4.22–5.81)
RDW: 12.9 % (ref 11.5–15.5)
WBC: 8.6 10*3/uL (ref 4.0–10.5)

## 2011-12-16 LAB — PROTIME-INR: INR: 1.18 (ref 0.00–1.49)

## 2011-12-16 MED ORDER — WARFARIN SODIUM 6 MG PO TABS
6.0000 mg | ORAL_TABLET | Freq: Once | ORAL | Status: AC
  Administered 2011-12-16: 6 mg via ORAL
  Filled 2011-12-16: qty 1

## 2011-12-16 MED ORDER — BRIMONIDINE TARTRATE 0.2 % OP SOLN
1.0000 [drp] | Freq: Every day | OPHTHALMIC | Status: DC
  Administered 2011-12-16 – 2011-12-19 (×4): 1 [drp] via OPHTHALMIC
  Filled 2011-12-16: qty 5

## 2011-12-16 NOTE — Progress Notes (Signed)
Patient ID: Nathaniel [name], male   DOB: Sep 16, 1935, 77 y.o.   MRN: [id number] PATIENT ID: Nathaniel [name]  MRN: [id number]  DOB/AGE:  October 20, 1935 / 77 y.o.  1 Day Post-Op Procedure(s) (LRB): TOTAL KNEE ARTHROPLASTY (Right)    PROGRESS NOTE Subjective: Patient is alert, oriented, no Nausea, no Vomiting, yes passing gas, no Bowel Movement. Taking PO sips. Denies SOB, Chest or Calf Pain. Using Incentive Spirometer, PAS in place. Ambulate WBAT, CPM 0-30 Patient reports pain as 8 on 0-10 scale controlled .    Objective: Vital signs in last 24 hours: Filed Vitals:   12/15/11 1520 12/15/11 2115 12/16/11 0159 12/16/11 0608  BP: 162/98 153/82 151/72 117/71  Pulse: 65 95 88 95  Temp: 98.6 F (37 C) 99.7 F (37.6 C) 99.5 F (37.5 C) 99.3 F (37.4 C)  TempSrc: Oral     Resp: 12 18 20 18   Height: 5\' 11"  (1.803 m)     Weight: 79.2 kg (174 lb 9.7 oz)     SpO2: 98% 98% 96% 97%      Intake/Output from previous day: I/O last 3 completed shifts: In: 1430 [I.V.:1430] Out: 795 [Urine:300; Drains:395; Blood:100]   Intake/Output this shift: Total I/O In: 1308.3 [I.V.:1308.3] Out: 980 [Urine:800; Drains:180]   LABORATORY DATA: No results found for this basename: WBC:2,HGB:2,HCT:2,PLT:2,NA:2,K:2,CL:2,CO2:2,BUN:2,CREATININE:2,GLUCOSE:2,GLUCAP:3,PT:2,INR:2,CALCIUM,2 in the last 72 hours  Examination: Neurologically intact ABD soft Neurovascular intact Sensation intact distally Intact pulses distally Dorsiflexion/Plantar flexion intact Incision: no drainage No cellulitis present Compartment soft}  Assessment:   1 Day Post-Op Procedure(s) (LRB): TOTAL KNEE ARTHROPLASTY (Right) ADDITIONAL DIAGNOSIS:  none Plan: PT/OT WBAT, CPM 5/hrs day until ROM 0-90 degrees, then D/C CPM DVT Prophylaxis:  Lovenox\Coumadin bridge target INR 1.5-2.0 DISCHARGE PLAN: Home DISCHARGE NEEDS: HHPT, HHRN, CPM, Walker and 3-in-1 comode seat Check labs     Rianna Lukes J 12/16/2011, 6:41 AM

## 2011-12-16 NOTE — Progress Notes (Signed)
UR COMPLETED  

## 2011-12-16 NOTE — Evaluation (Signed)
Occupational Therapy Evaluation Patient Details Name: Nathaniel [name] MRN: [id number] DOB: 1934/12/06 Today's Date: 12/16/2011  Problem List: There is no problem list on file for this patient.   Past Medical History:  Past Medical History  Diagnosis Date  . Arthritis   . Environmental allergies     pollen , leaves, grass  . Blood transfusion 2005    s/p hip replacement  . Glaucoma of left eye     legally blind in left eye  . Colon polyps   . Erectile dysfunction    Past Surgical History:  Past Surgical History  Procedure Date  . Joint replacement   . Multiple tooth extractions 2012    had 23 teeth pulled  . Eye surgery   . Cataract extraction     bilateral  . Glaucoma valve insertion 5621,3086    Left eye  . Hip arthroplasty 2005    Right Hip replacement x 3  . Total knee arthroplasty 12/15/2011    Procedure: TOTAL KNEE ARTHROPLASTY;  Surgeon: [name], MD;  Location: [facility] OR;  Service: Orthopedics;  Laterality: Right;  DEPUY SIGMA     OT Assessment/Plan/Recommendation OT Assessment Clinical Impression Statement: Pt. presents s/p rt. TKA and with increased pain affecting PLOF of Independent with ADLs. Pt. will benefit from skilled OT to get pt. to mod I level at D/C home. OT Recommendation/Assessment: Patient will need skilled OT in the acute care venue OT Problem List: Decreased strength;Decreased activity tolerance;Impaired balance (sitting and/or standing);Decreased safety awareness;Decreased knowledge of use of DME or AE;Decreased knowledge of precautions Barriers to Discharge: None OT Therapy Diagnosis : Generalized weakness;Acute pain OT Plan OT Frequency: Min 2X/week OT Treatment/Interventions: Self-care/ADL training;Energy conservation;DME and/or AE instruction;Therapeutic activities;Patient/family education;Balance training OT Recommendation Follow Up Recommendations: Home health OT;Supervision - Intermittent Equipment Recommended: None recommended by  OT Individuals Consulted Consulted and Agree with Results and Recommendations: Patient OT Goals Acute Rehab OT Goals OT Goal Formulation: With patient Time For Goal Achievement: 7 days ADL Goals Pt Will Perform Grooming: with modified independence;Standing at sink ADL Goal: Grooming - Progress: Goal set today Pt Will Perform Lower Body Bathing: with modified independence;Sit to stand from bed ADL Goal: Lower Body Bathing - Progress: Goal set today Pt Will Perform Lower Body Dressing: with modified independence;with adaptive equipment;Sit to stand from bed ADL Goal: Lower Body Dressing - Progress: Goal set today Pt Will Transfer to Toilet: with modified independence;with DME;Ambulation;3-in-1 ADL Goal: Toilet Transfer - Progress: Goal set today Pt Will Perform Tub/Shower Transfer: Shower transfer;with modified independence;with DME;Ambulation;Maintaining weight bearing status ADL Goal: Tub/Shower Transfer - Progress: Goal set today  OT Evaluation Precautions/Restrictions  Precautions Precautions: Knee Required Braces or Orthoses: No Restrictions Weight Bearing Restrictions: Yes RLE Weight Bearing: Weight bearing as tolerated Prior Functioning Home Living Lives With: Spouse Type of Home: House Home Layout: One level Home Access: Stairs to enter Entrance Stairs-Rails: None Entrance Stairs-Number of Steps: 1 Bathroom Shower/Tub: Health visitor: Handicapped height Home Adaptive Equipment: Bedside commode/3-in-1;Walker - rolling;Reacher;Sock aid;Long-handled sponge Prior Function Level of Independence: Independent with basic ADLs;Independent with gait;Independent with transfers Able to Take Stairs?: Yes Driving: Yes Vocation: Retired Leisure: Hobbies-yes (Comment) Comments: Pt. enjoys puzzles ADL ADL Eating/Feeding: Performed;Independent Where Assessed - Eating/Feeding: Chair Grooming: Performed;Wash/dry hands;Minimal assistance;Set up Where Assessed -  Grooming: Standing at sink Upper Body Bathing: Simulated;Chest;Right arm;Left arm;Abdomen;Set up Where Assessed - Upper Body Bathing: Sitting, chair Lower Body Bathing: Simulated;Moderate assistance Where Assessed - Lower Body Bathing: Sit to stand from  bed Upper Body Dressing: Performed;Minimal assistance Upper Body Dressing Details (indicate cue type and reason): With donning gown Where Assessed - Upper Body Dressing: Sitting, bed Lower Body Dressing: Simulated;Maximal assistance Where Assessed - Lower Body Dressing: Sit to stand from bed Toilet Transfer: Simulated;Moderate assistance Toilet Transfer Method: Stand pivot Toilet Transfer Equipment: Other (comment) Nurse, children's) Toileting - Clothing Manipulation: Simulated;Minimal assistance Where Assessed - Glass blower/designer Manipulation: Standing Toileting - Hygiene: Simulated;Minimal assistance Where Assessed - Toileting Hygiene: Standing Tub/Shower Transfer: Not assessed ADL Comments: Pt. educated on techniques for completing LB ADLs and safety with transfers during ADLs due to Rt. LE increased pain and safety with RW Vision/Perception  Vision - History Baseline Vision: No visual deficits Patient Visual Report: No change from baseline Vision - Assessment Eye Alignment: Within Functional Limits Cognition Cognition Arousal/Alertness: Awake/alert Orientation Level: Oriented X4    Extremity Assessment RUE Assessment RUE Assessment: Within Functional Limits LUE Assessment LUE Assessment: Within Functional Limits Mobility  Bed Mobility Bed Mobility: Yes Supine to Sit: 3: Mod assist Supine to Sit Details (indicate cue type and reason): assist for trunk and R LE managment Transfers Transfers: Yes Sit to Stand: 3: Mod assist Sit to Stand Details (indicate cue type and reason): verbal cues for hand placement    End of Session OT - End of Session Equipment Utilized During Treatment: Gait belt Activity Tolerance: Patient  tolerated treatment well Patient left: in chair;with call bell in reach Nurse Communication: Mobility status for transfers General Behavior During Session: [facility] for tasks performed Cognition: [facility] for tasks performed   Nathaniel [name], OTR/L Pager [phone] 12/16/2011, 1:51 PM

## 2011-12-16 NOTE — Progress Notes (Signed)
Physical Therapy Evaluation Patient Details Name: Nathaniel [name] MRN: [id number] DOB: 10/18/1935 Today's Date: 12/16/2011  Problem List: There is no problem list on file for this patient.   Past Medical History:  Past Medical History  Diagnosis Date  . Arthritis   . Environmental allergies     pollen , leaves, grass  . Blood transfusion 2005    s/p hip replacement  . Glaucoma of left eye     legally blind in left eye  . Colon polyps   . Erectile dysfunction    Past Surgical History:  Past Surgical History  Procedure Date  . Joint replacement   . Multiple tooth extractions 2012    had 23 teeth pulled  . Eye surgery   . Cataract extraction     bilateral  . Glaucoma valve insertion 5621,3086    Left eye  . Hip arthroplasty 2005    Right Hip replacement x 3  . Total knee arthroplasty 12/15/2011    Procedure: TOTAL KNEE ARTHROPLASTY;  Surgeon: [name], MD;  Location: [facility] OR;  Service: Orthopedics;  Laterality: Right;  DEPUY SIGMA     PT Assessment/Plan/Recommendation PT Assessment Clinical Impression Statement: Pt s/p R tKA presenting with increased R LE pain and decreased R LE strength and active R knee ROM.Marland KitchenPatient to benefit from skilled PT while in hospital and upon d/c for maximal functional recovery. PT Recommendation/Assessment: Patient will need skilled PT in the acute care venue PT Problem List: Decreased strength;Decreased range of motion;Decreased balance;Decreased activity tolerance;Decreased mobility PT Therapy Diagnosis : Difficulty walking;Abnormality of gait;Generalized weakness;Acute pain PT Plan PT Frequency: 7X/week PT Treatment/Interventions: DME instruction;Gait training;Stair training;Functional mobility training;Therapeutic activities;Therapeutic exercise PT Recommendation Equipment Recommended: Rolling walker with 5" wheels PT Goals  Acute Rehab PT Goals PT Goal Formulation: With patient/family Time For Goal Achievement: 7 days Pt will go  Supine/Side to Sit: with modified independence;with HOB 0 degrees PT Goal: Supine/Side to Sit - Progress: Goal set today Pt will go Sit to Stand: with supervision PT Goal: Sit to Stand - Progress: Goal set today Pt will Ambulate: 51 - 150 feet;with supervision;with rolling walker PT Goal: Ambulate - Progress: Goal set today Pt will Go Up / Down Stairs: 1-2 stairs;with supervision;with rolling walker PT Goal: Up/Down Stairs - Progress: Goal set today Pt will Perform Home Exercise Program: Independently PT Goal: Perform Home Exercise Program - Progress: Goal set today  PT Evaluation Precautions/Restrictions  Precautions Precautions: Knee Required Braces or Orthoses: No Restrictions Weight Bearing Restrictions: Yes RLE Weight Bearing: Weight bearing as tolerated Prior Functioning  Home Living Lives With: Spouse Type of Home: House Home Layout: One level Home Access: Stairs to enter Entrance Stairs-Rails: None Entrance Stairs-Number of Steps: 1 Bathroom Shower/Tub: Health visitor: Handicapped height Home Adaptive Equipment: Bedside commode/3-in-1;Walker - rolling;Reacher;Sock aid;Long-handled sponge Prior Function Level of Independence: Independent with basic ADLs;Independent with gait;Independent with transfers Able to Take Stairs?: Yes Driving: Yes Vocation: Retired Leisure: Hobbies-yes (Comment) Comments: Pt. enjoys puzzles Cognition Cognition Arousal/Alertness: Awake/alert Orientation Level: Oriented X4 Pain: 7/10 R knee pain   Extremity Assessment RUE Assessment RUE Assessment: Within Functional Limits LUE Assessment LUE Assessment: Within Functional Limits RLE Assessment RLE Assessment: Within Functional Limits LLE Assessment LLE Assessment: Within Functional Limits Mobility (including Balance) Bed Mobility Bed Mobility: Yes Supine to Sit: 3: Mod assist Supine to Sit Details (indicate cue type and reason): assist for trunk and R LE  managment Transfers Sit to Stand: 3: Mod assist Sit to Stand Details (  indicate cue type and reason): verbal cues for hand placement Ambulation/Gait Ambulation/Gait: Yes Ambulation/Gait Assistance: 3: Mod assist Ambulation/Gait Assistance Details (indicate cue type and reason): max directional cues to complete R quad set during stance Ambulation Distance (Feet): 3 Feet (feet to chair) Assistive device: Rolling walker Gait Pattern: Step-to pattern;Decreased step length - right;Decreased stance time - right Gait velocity: decreased Stairs: No Wheelchair Mobility Wheelchair Mobility: No  Posture/Postural Control Posture/Postural Control: No significant limitations Balance Balance Assessed: No Exercise  Total Joint Exercises Ankle Circles/Pumps: PROM;Both;10 reps;Supine Quad Sets: AROM;Right;10 reps;Supine Heel Slides: AAROM;Right;10 reps;Supine End of Session PT - End of Session Equipment Utilized During Treatment: Gait belt Activity Tolerance: Patient tolerated treatment well Patient left: in chair;with call bell in reach;with family/visitor present Nurse Communication: Mobility status for transfers;Mobility status for ambulation General Behavior During Session: [facility] for tasks performed Cognition: [facility] for tasks performed  Marcene Brawn 12/16/2011, 1:54 PM  Lewis Shock, PT, DPT Pager #: [phone] Office #: [phone]

## 2011-12-16 NOTE — Progress Notes (Signed)
ANTICOAGULATION CONSULT NOTE -Follow Up  Pharmacy Consult for Coumadin Indication: VTE prophylaxis s/p Right TKA  Allergies  Allergen Reactions  . Advil Hives    Conflict with other meds    Patient Measurements: Height: 5\' 11"  (180.3 cm) Weight: 174 lb 9.7 oz (79.2 kg) IBW/kg (Calculated) : 75.3   Vital Signs: Temp: 99.3 F (37.4 C) (02/12 0608) BP: 117/71 mmHg (02/12 0608) Pulse Rate: 95  (02/12 0608)  Labs:  Basename 12/16/11 0542  HGB 11.4*  HCT 34.5*  PLT 192  APTT --  LABPROT 15.2  INR 1.18  HEPARINUNFRC --  CREATININE 0.78  CKTOTAL --  CKMB --  TROPONINI --   Estimated Creatinine Clearance: 86.3 ml/min (by C-G formula based on Cr of 0.78).  Medical History: Past Medical History  Diagnosis Date  . Arthritis   . Environmental allergies     pollen , leaves, grass  . Blood transfusion 2005    s/p hip replacement  . Glaucoma of left eye     legally blind in left eye  . Colon polyps   . Erectile dysfunction     Medications:  Scheduled:     . baclofen  10 mg Oral QID  . brimonidine  1 drop Both Eyes QAC breakfast  .  ceFAZolin (ANCEF) IV  1 g Intravenous 60 min Pre-Op  . dorzolamide-timolol  1 drop Both Eyes BID  . enoxaparin  30 mg Subcutaneous Q12H  . gabapentin  800 mg Oral QID  . labetalol      . loratadine  10 mg Oral Daily  . patient's guide to using coumadin book   Does not apply Once  . Travoprost (BAK Free)  1 drop Both Eyes QHS  . warfarin  7.5 mg Oral ONCE-1800  . warfarin   Does not apply Once  . DISCONTD: brimonidine  1 drop Both Eyes QAC breakfast  . DISCONTD: chlorhexidine  60 mL Topical Once  . DISCONTD: gabapentin  800 mg Oral QID  . DISCONTD: gabapentin  800 mg Oral QID    Assessment: 76 YOM s/p right total knee replacement POD#1, on  coumadin for VTE prophylaxis. Patient was not taking anticoagulants prior to admission. Today INR is 1.18 (baseline INR 0.98).  He is also on lovenox 30 mg q 12hrs until INR =/> 1.8.  Crcl ~  47ml/min.  No bleeding reported.   Goal of Therapy:  INR 1.5-2   Plan:  1. Coumadin 6 mg po x 1 2. F/u INR daily.  3.  Will d/c Lovenox when INR =/> 1.8 per MD's orders.  Arman Filter 12/16/2011,11:18 AM

## 2011-12-16 NOTE — Progress Notes (Signed)
CARE MANAGEMENT NOTE 12/16/2011  Patient:  Nathaniel [name], Nathaniel [name]   Account Number:  [id number]  Date Initiated:  12/16/2011  Documentation initiated by:  Vance Peper  Subjective/Objective Assessment:   77 yr old male s/p right total Knee arthroplasty.     Action/Plan:   Discharge planning. Spoke with patient. Choice offered. Preoperatively setup with [facility], no changes. Has rolling walker, 3in1 and CPM to be delivered to the home.   Anticipated DC Date:  12/18/2011   Anticipated DC Plan:  HOME W HOME HEALTH SERVICES      DC Planning Services  CM consult      PAC Choice  DURABLE MEDICAL EQUIPMENT  HOME HEALTH   Choice offered to / List presented to:  C-1 Patient   DME arranged  3-N-1      DME agency  [employer]     HH arranged  HH-2 PT      HH agency  [facility]   Status of service:  Completed, signed off Medicare Important Message given?   (If response is "NO", the following Medicare IM given date fields will be blank) Date Medicare IM given:   Date Additional Medicare IM given:    Discharge Disposition:  HOME W HOME HEALTH SERVICES

## 2011-12-16 NOTE — Progress Notes (Signed)
Physical Therapy Treatment Patient Details Name: Nathaniel [name] MRN: [id number] DOB: 1935/09/30 Today's Date: 12/16/2011  PT Assessment/Plan  PT - Assessment/Plan Comments on Treatment Session: Pt making good progress with knee ROM (approx 75 deg this tx with overpressure), but requires AAROM for most ther ex and continues to have difficulty with gait due to UE weakness. Pt may need to consider SNF pending progress.  PT Plan: Discharge plan needs to be updated PT Frequency: 7X/week Follow Up Recommendations: Skilled nursing facility;Home health PT;Supervision/Assistance - 24 hour PT Goals  Acute Rehab PT Goals PT Goal: Supine/Side to Sit - Progress: Progressing toward goal PT Goal: Sit to Stand - Progress: Progressing toward goal PT Goal: Ambulate - Progress: Progressing toward goal PT Goal: Perform Home Exercise Program - Progress: Progressing toward goal  PT Treatment Precautions/Restrictions  Precautions Precautions: Knee Required Braces or Orthoses: No Restrictions Weight Bearing Restrictions: Yes RLE Weight Bearing: Weight bearing as tolerated Mobility (including Balance) Bed Mobility Bed Mobility: Yes Supine to Sit: 4: Min assist;HOB elevated (Comment degrees) (HOB 30 deg) Supine to Sit Details (indicate cue type and reason): A for RLE only Sit to Supine: 4: Min assist;HOB flat Sit to Supine - Details (indicate cue type and reason): Assist for lifting RLE into bed, verbal cues for scooting Transfers Transfers: Yes Sit to Stand: 3: Mod assist;With upper extremity assist;From bed Sit to Stand Details (indicate cue type and reason): Cues for hand placement. Lifting assist as pt has difficulty transitioning to full stand once half way up.  Stand to Sit: 3: Mod assist;With upper extremity assist;To bed Stand to Sit Details: Pt needed lowering assist due to sitting before safely back to bed as arms gave out.  Ambulation/Gait Ambulation/Gait: Yes Ambulation/Gait Assistance: 2:  Max assist Ambulation/Gait Assistance Details (indicate cue type and reason): Pt only able to take 2 steps forward and backward from bed. Cues for gait pattern, posture. Assist for weight shifting, RW management, and safely returning to bed. Pt had difficulty due to UE weakness unable to support sufficiently to unweight RLE. Arms began trembling and giving out.  Ambulation Distance (Feet): 3 Feet Assistive device: Rolling walker Gait Pattern: Step-to pattern;Decreased step length - left;Decreased stance time - right;Decreased weight shift to right;Right flexed knee in stance;Trunk flexed    Exercise  Total Joint Exercises Ankle Circles/Pumps: Supine;20 reps;Both;Strengthening;AAROM Quad Sets: Supine;10 reps;Right;Strengthening;AROM Short Arc Quad: Supine;10 reps;Right;Strengthening;AAROM Heel Slides: Supine;10 reps;Right;Strengthening;AAROM Hip ABduction/ADduction: Supine;10 reps;Right;Strengthening;AAROM Straight Leg Raises: Supine;10 reps;AAROM;Right;Strengthening Knee Flexion: Seated;10 reps;Right;PROM End of Session PT - End of Session Equipment Utilized During Treatment: Gait belt Activity Tolerance: Patient limited by fatigue Patient left: in bed;with call bell in reach (Suggested CPM, but pt unwilling at this time. ) General Behavior During Session: [facility] for tasks performed Cognition: [facility] for tasks performed  [facility] [place], [place] [phone]  12/16/2011, 3:59 PM

## 2011-12-17 LAB — CBC
HCT: 34.3 % — ABNORMAL LOW (ref 39.0–52.0)
Hemoglobin: 11.1 g/dL — ABNORMAL LOW (ref 13.0–17.0)
MCV: 90 fL (ref 78.0–100.0)
RDW: 13.3 % (ref 11.5–15.5)
WBC: 11.6 10*3/uL — ABNORMAL HIGH (ref 4.0–10.5)

## 2011-12-17 LAB — PROTIME-INR: INR: 1.49 (ref 0.00–1.49)

## 2011-12-17 MED ORDER — WARFARIN SODIUM 5 MG PO TABS
5.0000 mg | ORAL_TABLET | Freq: Once | ORAL | Status: AC
  Administered 2011-12-17: 5 mg via ORAL
  Filled 2011-12-17: qty 1

## 2011-12-17 NOTE — Progress Notes (Addendum)
Physical Therapy Treatment Patient Details Name: Nathaniel [name] MRN: [id number] DOB: 1935/01/07 Today's Date: 12/17/2011  PT Assessment/Plan  PT - Assessment/Plan Comments on Treatment Session: Pt able to ambulate further this afternoon than this morning.  PT Plan: Discharge plan remains appropriate PT Frequency: Min 3X/week Follow Up Recommendations: Home health PT;Supervision/Assistance - 24 hour;Skilled nursing facility Equipment Recommended: Rolling walker with 5" wheels PT Goals  Acute Rehab PT Goals PT Goal: Supine/Side to Sit - Progress: Progressing toward goal PT Goal: Sit to Stand - Progress: Progressing toward goal PT Goal: Ambulate - Progress: Progressing toward goal PT Goal: Up/Down Stairs - Progress: Progressing toward goal  PT Treatment Precautions/Restrictions  Precautions Precautions: Knee Required Braces or Orthoses: No Restrictions Weight Bearing Restrictions: Yes RLE Weight Bearing: Weight bearing as tolerated Mobility (including Balance) Bed Mobility Bed Mobility: No Transfers Sit to Stand: 4: Min assist;From chair/3-in-1;With upper extremity assist;With armrests Sit to Stand Details (indicate cue type and reason): A to initiate stand and cues for safe hand placement Stand to Sit: 4: Min assist;With upper extremity assist;With armrests;To chair/3-in-1 Stand to Sit Details: A to control descent into chair and for safe hand placement Ambulation/Gait Ambulation/Gait Assistance: 4: Min assist Ambulation/Gait Assistance Details (indicate cue type and reason): A to inititate increase step length. Cues for posture and gait sequence Ambulation Distance (Feet): 80 Feet Assistive device: Rolling walker Gait Pattern: Step-to pattern;Decreased stride length;Trunk flexed Gait velocity: decreased    Exercise  Total Joint Exercises Ankle Circles/Pumps: AROM;Both;10 reps Quad Sets: AROM;Right;10 reps Short Arc QuadBarbaraann Boys;Right;10 reps Heel Slides: AAROM;Right;10  reps Hip ABduction/ADduction: AAROM;Right;10 reps Straight Leg Raises: AAROM;Right;10 reps End of Session PT - End of Session Equipment Utilized During Treatment: Gait belt Activity Tolerance: Patient tolerated treatment well Patient left: in chair;with call bell in reach Nurse Communication: Mobility status for transfers;Mobility status for ambulation General Behavior During Session: [facility] for tasks performed Cognition: [facility] for tasks performed  Cullen Vanallen, Adline Potter 12/17/2011, 12:40 PM  12/17/2011 Fredrich Birks PTA [phone] pager [phone] office

## 2011-12-17 NOTE — Progress Notes (Signed)
ANTICOAGULATION CONSULT NOTE -Follow Up  Pharmacy Consult for Coumadin Indication: VTE prophylaxis s/p Right TKA  Allergies  Allergen Reactions  . Advil Hives    Conflict with other meds    Patient Measurements: Height: 5\' 11"  (180.3 cm) Weight: 174 lb 9.7 oz (79.2 kg) IBW/kg (Calculated) : 75.3   Vital Signs: Temp: 99.5 F (37.5 C) (02/13 0553) BP: 159/83 mmHg (02/13 0553) Pulse Rate: 94  (02/13 0553)  Labs:  Basename 12/17/11 0550 12/16/11 0542  HGB 11.1* 11.4*  HCT 34.3* 34.5*  PLT 163 192  APTT -- --  LABPROT 18.3* 15.2  INR 1.49 1.18  HEPARINUNFRC -- --  CREATININE -- 0.78  CKTOTAL -- --  CKMB -- --  TROPONINI -- --   Estimated Creatinine Clearance: 86.3 ml/min (by C-G formula based on Cr of 0.78).  Medical History: Past Medical History  Diagnosis Date  . Arthritis   . Environmental allergies     pollen , leaves, grass  . Blood transfusion 2005    s/p hip replacement  . Glaucoma of left eye     legally blind in left eye  . Colon polyps   . Erectile dysfunction     Medications:  Scheduled:     . baclofen  10 mg Oral QID  . brimonidine  1 drop Both Eyes QAC breakfast  . dorzolamide-timolol  1 drop Both Eyes BID  . enoxaparin  30 mg Subcutaneous Q12H  . gabapentin  800 mg Oral QID  . loratadine  10 mg Oral Daily  . patient's guide to using coumadin book   Does not apply Once  . Travoprost (BAK Free)  1 drop Both Eyes QHS  . warfarin  6 mg Oral ONCE-1800  . warfarin   Does not apply Once    Assessment: 69 YOM s/p right total knee replacement POD#2, on  coumadin for VTE prophylaxis. Patient was not taking anticoagulants prior to admission. Today INR is 1.49 (1.18 <--0.98) after 7.5mg  and 6mg  doses.Rubbie Battiest with lovenox 30 mg q 12hrs until INR =/> 1.8.  Crcl ~ 63ml/min. CBC stable. No bleeding reported.   Goal of Therapy:  INR 1.5-2   Plan:  1. Coumadin 5 mg po x 1 2. F/u INR daily.  3.  Will d/c Lovenox when INR =/> 1.8 per MD's  orders.  Arman Filter, RPh 12/17/2011,9:42 AM

## 2011-12-17 NOTE — Progress Notes (Signed)
Occupational Therapy Treatment Patient Details Name: Nathaniel [name] MRN: [id number] DOB: 22-Feb-1935 Today's Date: 12/17/2011  OT Assessment/Plan OT Assessment/Plan Comments on Treatment Session: Pt. with improved mobility with ADL tasks today, however pt. still requiring assist to complete and discussed with pt. possibility of ST-SNF stay to decrease burden of care at D/C. OT Plan: Discharge plan needs to be updated OT Frequency: Min 2X/week Follow Up Recommendations: Home health OT;Supervision - Intermittent; Possible ST-Skilled nursing facility stay pending pt's progress.  Equipment Recommended: Rolling walker with 5" wheels OT Goals Acute Rehab OT Goals OT Goal Formulation: With patient Time For Goal Achievement: 7 days ADL Goals Pt Will Perform Grooming: with modified independence;Standing at sink ADL Goal: Grooming - Progress: Progressing toward goals Pt Will Perform Lower Body Bathing: with modified independence;Sit to stand from bed ADL Goal: Lower Body Bathing - Progress: Not met Pt Will Perform Lower Body Dressing: with modified independence;with adaptive equipment;Sit to stand from bed Pt Will Transfer to Toilet: with modified independence;with DME;Ambulation;3-in-1 ADL Goal: Toilet Transfer - Progress: Progressing toward goals  OT Treatment Precautions/Restrictions  Precautions Precautions: Knee Required Braces or Orthoses: No Restrictions RLE Weight Bearing: Weight bearing as tolerated   ADL ADL Grooming: Performed;Wash/dry hands;Set up;Supervision/safety Grooming Details (indicate cue type and reason): Min verbal cues for hand placement and safety with RW use Where Assessed - Grooming: Standing at sink Upper Body Dressing: Performed;Set up Upper Body Dressing Details (indicate cue type and reason): With donning gown Where Assessed - Upper Body Dressing: Sitting, chair Toilet Transfer: Simulated;Minimal assistance Toilet Transfer Details (indicate cue type and  reason): Mod verbal cues for hand placement Mobility  Bed Mobility Bed Mobility: No Transfers Transfers: Yes Sit to Stand: 4: Min assist;From chair/3-in-1;With upper extremity assist;With armrests Sit to Stand Details (indicate cue type and reason): A to initiate stand and cues for safe hand placement Stand to Sit: 4: Min assist;With upper extremity assist;With armrests;To chair/3-in-1 Stand to Sit Details: A to control descent into chair and for safe hand placement Exercises Educated pt. On bil UE shoulder raises and bicep curls 10 reps 3sets a day to increase UE strength for transfers with use of small weight 1/2lb-1lb.  End of Session OT - End of Session Equipment Utilized During Treatment: Gait belt Activity Tolerance: Patient tolerated treatment well Patient left: in chair;with call bell in reach Nurse Communication: Mobility status for transfers General Behavior During Session: [facility] for tasks performed Cognition: [facility] for tasks performed  Alee Katen, OTR/L Pager [phone]  12/17/2011, 2:32 PM

## 2011-12-17 NOTE — Progress Notes (Signed)
Physical Therapy Treatment Patient Details Name: Nathaniel [name] MRN: [id number] DOB: 09/21/1935 Today's Date: 12/17/2011  PT Assessment/Plan  PT - Assessment/Plan Comments on Treatment Session: Pt progressing well today with ambulation. At this time patient still requiring a good amount of assist with all mobility and would recommend SNF at DC pending progress this afternoon.  PT Plan: Discharge plan remains appropriate PT Frequency: 7X/week Follow Up Recommendations: Home health PT;Skilled nursing facility;Supervision/Assistance - 24 hour Equipment Recommended: Rolling walker with 5" wheels PT Goals  Acute Rehab PT Goals PT Goal: Supine/Side to Sit - Progress: Progressing toward goal PT Goal: Sit to Stand - Progress: Progressing toward goal PT Goal: Ambulate - Progress: Progressing toward goal  PT Treatment Precautions/Restrictions  Precautions Precautions: Knee Required Braces or Orthoses: No Restrictions Weight Bearing Restrictions: Yes RLE Weight Bearing: Weight bearing as tolerated Mobility (including Balance) Bed Mobility Supine to Sit: 4: Min assist;With rails;HOB elevated (Comment degrees) (20) Supine to Sit Details (indicate cue type and reason): A for right LE and to lift shoulders off of bed. Cues safe positioning of LEs Sitting - Scoot to Edge of Bed: 4: Min assist Sitting - Scoot to Edge of Bed Details (indicate cue type and reason): Cues for safe scooting and efficency. Transfers Sit to Stand: 4: Min assist;From bed;With upper extremity assist Sit to Stand Details (indicate cue type and reason): A to shift weight anteriorly over BOS. Cues for safe technique and hand placement Stand to Sit: 4: Min assist;To chair/3-in-1;With armrests;With upper extremity assist Stand to Sit Details: A to control descent into recliner. Cues for safe hand placement and to slide out R LE Ambulation/Gait Ambulation/Gait Assistance: 4: Min assist Ambulation/Gait Assistance Details  (indicate cue type and reason): A for safe management of RW and for balance. Cues for gait sequence and posture. Pt able to increase ambulation today but limited in distance due to weakness in UEs Ambulation Distance (Feet): 40 Feet Assistive device: Rolling walker Gait Pattern: Step-to pattern;Decreased stride length;Trunk flexed Gait velocity: decreased    Exercise  Total Joint Exercises Ankle Circles/Pumps: AROM;Both;10 reps Quad Sets: AROM;Right;10 reps Short Arc Quad: Right;AAROM;10 reps Heel Slides: AAROM;Right;10 reps Hip ABduction/ADduction: AAROM;Right;10 reps Straight Leg Raises: AAROM;Right;10 reps End of Session PT - End of Session Equipment Utilized During Treatment: Gait belt Activity Tolerance: Patient tolerated treatment well Patient left: in chair;with call bell in reach Nurse Communication: Mobility status for transfers;Mobility status for ambulation General Behavior During Session: [facility] for tasks performed Cognition: [facility] for tasks performed  Fredrich Birks 12/17/2011, 9:44 AM 12/17/2011 Fredrich Birks PTA [phone] pager [phone] office

## 2011-12-17 NOTE — Progress Notes (Signed)
Patient ID: Nathaniel [name], male   DOB: 1935-04-18, 77 y.o.   MRN: [id number] PATIENT ID: Nathaniel [name]  MRN: [id number]  DOB/AGE:  06/29/1935 / 77 y.o.  2 Days Post-Op Procedure(s) (LRB): TOTAL KNEE ARTHROPLASTY (Right)    PROGRESS NOTE Subjective: Patient is alert, oriented, no Nausea, no Vomiting, yes} passing gas, yes Bowel Movement. Taking PO well. Denies SOB, Chest or Calf Pain. Using Incentive Spirometer, PAS in place. Ambulate in room, CPM 0-40 Patient reports pain as 2 on 0-10 scale  .    Objective: Vital signs in last 24 hours: Filed Vitals:   12/16/11 0608 12/16/11 1300 12/16/11 2112 12/17/11 0553  BP: 117/71 136/79 98/61 159/83  Pulse: 95 94 91 94  Temp: 99.3 F (37.4 C) 99.1 F (37.3 C) 100.2 F (37.9 C) 99.5 F (37.5 C)  TempSrc:      Resp: 18 18 18 18   Height:      Weight:      SpO2: 97% 93% 94% 95%      Intake/Output from previous day: I/O last 3 completed shifts: In: 5108.3 [P.O.:600; I.V.:4508.3] Out: 3630 [Urine:3450; Drains:180]   Intake/Output this shift:     LABORATORY DATA:  Basename 12/17/11 0550 12/16/11 0542  WBC 11.6* 8.6  HGB 11.1* 11.4*  HCT 34.3* 34.5*  PLT 163 192  NA -- 135  K -- 3.9  CL -- 104  CO2 -- 23  BUN -- 9  CREATININE -- 0.78  GLUCOSE -- 145*  GLUCAP -- --  INR 1.49 1.18  CALCIUM -- 8.5    Examination: Neurologically intact ABD soft Neurovascular intact Sensation intact distally Intact pulses distally Dorsiflexion/Plantar flexion intact Incision: no drainage No cellulitis present Compartment soft}  Assessment:   2 Days Post-Op Procedure(s) (LRB): TOTAL KNEE ARTHROPLASTY (Right) ADDITIONAL DIAGNOSIS:    Plan: PT/OT WBAT, CPM 5/hrs day until ROM 0-90 degrees, then D/C CPM DVT Prophylaxis:  Lovenox\Coumadin bridge target INR 1.5-2.0 DISCHARGE PLAN: Home DISCHARGE NEEDS: HHPT, HHRN, CPM, Walker and 3-in-1 comode seat Change dressing today     Nathaniel [name] 12/17/2011, 7:17 AM

## 2011-12-17 NOTE — Progress Notes (Signed)
Clinical Social Work-CSW recieved referral for ST SNF-reviewed chart, discussed with RNCM and PN-pt will d/c home with home health. No CSW needs, please re-consult if further needs arise-Lorain Keast-MSW, [phone] 

## 2011-12-18 LAB — CBC
MCH: 29.1 pg (ref 26.0–34.0)
MCHC: 32.4 g/dL (ref 30.0–36.0)
Platelets: 170 10*3/uL (ref 150–400)
RDW: 13.3 % (ref 11.5–15.5)

## 2011-12-18 LAB — PROTIME-INR
INR: 1.45 (ref 0.00–1.49)
Prothrombin Time: 17.9 seconds — ABNORMAL HIGH (ref 11.6–15.2)

## 2011-12-18 MED ORDER — WARFARIN SODIUM 5 MG PO TABS
5.0000 mg | ORAL_TABLET | Freq: Once | ORAL | Status: DC
  Filled 2011-12-18: qty 1

## 2011-12-18 NOTE — Progress Notes (Signed)
PATIENT ID: TERRENCE WISHON  MRN: [id number]  DOB/AGE:  [birth year] / 77 y.o.  3 Days Post-Op Procedure(s) (LRB): TOTAL KNEE ARTHROPLASTY (Right)    PROGRESS NOTE Subjective: Patient is alert, oriented, no Nausea, no Vomiting, yes} passing gas, no Bowel Movement. Taking PO well. Denies SOB, Chest or Calf Pain. Using Incentive Spirometer, PAS in place. Ambulating slowly with PT, CPM no more than 5 hrs per day. Patient reports pain as moderate  .    Objective: Vital signs in last 24 hours: Filed Vitals:   12/17/11 0553 12/17/11 1300 12/17/11 2137 12/18/11 0434  BP: 159/83 105/66 160/74 136/69  Pulse: 94 102 114 88  Temp: 99.5 F (37.5 C) 98 F (36.7 C) 100.4 F (38 C) 98.9 F (37.2 C)  TempSrc:   Oral Oral  Resp: 18 18 20 20   Height:      Weight:      SpO2: 95% 93% 97% 100%      Intake/Output from previous day: I/O last 3 completed shifts: In: 3670 [P.O.:720; I.V.:2950] Out: 2950 [Urine:2950]   Intake/Output this shift:     LABORATORY DATA:  Basename 12/18/11 0510 12/17/11 0550 12/16/11 0542  WBC 12.0* 11.6* --  HGB 10.9* 11.1* --  HCT 33.6* 34.3* --  PLT 170 163 --  NA -- -- 135  K -- -- 3.9  CL -- -- 104  CO2 -- -- 23  BUN -- -- 9  CREATININE -- -- 0.78  GLUCOSE -- -- 145*  GLUCAP -- -- --  INR 1.45 1.49 --  CALCIUM -- -- 8.5    Examination: Neurologically intact ABD soft Neurovascular intact Sensation intact distally Intact pulses distally Incision: dressing C/D/I}  Assessment:   3 Days Post-Op Procedure(s) (LRB): TOTAL KNEE ARTHROPLASTY (Right) ADDITIONAL DIAGNOSIS:  none  Plan: PT/OT WBAT, CPM 5/hrs day until ROM 0-90 degrees, then D/C CPM DVT Prophylaxis:  Lovenox\Coumadin bridge target INR 1.5-2.0 DISCHARGE PLAN: Skilled Nursing Facility/Rehab if not ready to go home Friday. DISCHARGE NEEDS: HHPT, HHRN, CPM, Walker and 3-in-1 comode seat     Sailor Haughn M. 12/18/2011, 9:30 AM

## 2011-12-18 NOTE — Progress Notes (Signed)
ANTICOAGULATION CONSULT NOTE -Follow Up  Pharmacy Consult for Coumadin Indication: VTE prophylaxis s/p Right TKA  Allergies  Allergen Reactions  . Advil Hives    Conflict with other meds    Patient Measurements: Height: 5\' 11"  (180.3 cm) Weight: 174 lb 9.7 oz (79.2 kg) IBW/kg (Calculated) : 75.3   Vital Signs: Temp: 98.1 F (36.7 C) (02/14 1318) Temp src: Oral (02/14 0434) BP: 86/52 mmHg (02/14 1318) Pulse Rate: 99  (02/14 1318)  Labs:  Basename 12/18/11 0510 12/17/11 0550 12/16/11 0542  HGB 10.9* 11.1* --  HCT 33.6* 34.3* 34.5*  PLT 170 163 192  APTT -- -- --  LABPROT 17.9* 18.3* 15.2  INR 1.45 1.49 1.18  HEPARINUNFRC -- -- --  CREATININE -- -- 0.78  CKTOTAL -- -- --  CKMB -- -- --  TROPONINI -- -- --   Estimated Creatinine Clearance: 86.3 ml/min (by C-G formula based on Cr of 0.78).  Medical History: Past Medical History  Diagnosis Date  . Arthritis   . Environmental allergies     pollen , leaves, grass  . Blood transfusion 2005    s/p hip replacement  . Glaucoma of left eye     legally blind in left eye  . Colon polyps   . Erectile dysfunction     Medications:  Scheduled:     . baclofen  10 mg Oral QID  . brimonidine  1 drop Both Eyes QAC breakfast  . dorzolamide-timolol  1 drop Both Eyes BID  . enoxaparin  30 mg Subcutaneous Q12H  . gabapentin  800 mg Oral QID  . loratadine  10 mg Oral Daily  . patient's guide to using coumadin book   Does not apply Once  . Travoprost (BAK Free)  1 drop Both Eyes QHS  . warfarin  5 mg Oral ONCE-1800  . warfarin   Does not apply Once    Assessment: 31 YOM s/p right total knee replacement POD#2, on  coumadin for VTE prophylaxis. Patient was not taking anticoagulants prior to admission. Today INR is 1.45 (1.49 yesterday) Goal INR 1.5-2. Bridge with lovenox 30 mg q 12hrs until INR =/> 1.8.  Crcl ~ 33ml/min. CBC stable. No bleeding reported.   Goal of Therapy:  INR 1.5-2   Plan:  1. Coumadin 5 mg po x 1 2.  F/u INR daily.  3.  Will d/c Lovenox when INR =/> 1.8 per MD's orders.  Arman Filter, RPh 12/18/2011,3:56 PM

## 2011-12-18 NOTE — Progress Notes (Signed)
Physical Therapy Treatment Patient Details Name: Nathaniel [name] MRN: [id number] DOB: Sep 03, 1935 Today's Date: 12/18/2011  PT Assessment/Plan  PT - Assessment/Plan Comments on Treatment Session: Pt not feeling well this morning. States that he stayed in the CPM too long and was having a lot of soreness PT Plan: Discharge plan remains appropriate PT Frequency: Min 3X/week Follow Up Recommendations: Home health PT Equipment Recommended: Rolling walker with 5" wheels PT Goals  Acute Rehab PT Goals PT Goal: Supine/Side to Sit - Progress: Progressing toward goal PT Goal: Sit to Stand - Progress: Progressing toward goal PT Goal: Ambulate - Progress: Progressing toward goal  PT Treatment Precautions/Restrictions  Precautions Precautions: Knee Required Braces or Orthoses: No Restrictions Weight Bearing Restrictions: Yes RLE Weight Bearing: Weight bearing as tolerated Mobility (including Balance) Bed Mobility Supine to Sit: 4: Min assist;With rails Supine to Sit Details (indicate cue type and reason): A for R LE. Cues for positioning and safety.  Sitting - Scoot to Edge of Bed: 5: Supervision Transfers Sit to Stand: 4: Min assist;From chair/3-in-1;From bed;With upper extremity assist;With armrests Sit to Stand Details (indicate cue type and reason): A for balance. Cues for positioning before stand and for safe hand placement Stand to Sit: Other (comment) (MinGuard A) Stand to Sit Details: Cues for positioning and to sit slowly Ambulation/Gait Ambulation/Gait Assistance: Other (comment);4: Min assist (MinGuard A) Ambulation/Gait Assistance Details (indicate cue type and reason): A for safety. Cues for posture and to increase step length.  Ambulation Distance (Feet): 25 Feet Assistive device: Rolling walker Gait Pattern: Step-to pattern;Decreased stride length;Trunk flexed Gait velocity: decreased    Exercise  Total Joint Exercises Quad Sets: AROM;Right;10 reps Short Arc Quad:  Right;10 reps;AAROM Heel Slides: AAROM;Right;10 reps Hip ABduction/ADduction: AAROM;Right;10 reps Straight Leg Raises: AAROM;Right;10 reps End of Session PT - End of Session Equipment Utilized During Treatment: Gait belt Activity Tolerance: Patient tolerated treatment well Patient left: in chair;with call bell in reach Nurse Communication: Mobility status for transfers;Mobility status for ambulation General Behavior During Session: [facility] for tasks performed Cognition: [facility] for tasks performed  Fredrich Birks 12/18/2011, 9:48 AM 12/18/2011 Fredrich Birks PTA [phone] pager [phone] office

## 2011-12-18 NOTE — Progress Notes (Signed)
Physical Therapy Treatment Patient Details Name: Nathaniel [name] MRN: [id number] DOB: [birth year] Today's Date: 12/18/2011  PT Assessment/Plan  PT - Assessment/Plan Comments on Treatment Session: Pt able to increase ambulation and participate more with therapy this afternoon. Pt stated that he is agreeable to STSNF at [place].  PT Plan: Discharge plan remains appropriate PT Frequency: 7X/week Follow Up Recommendations: Skilled nursing facility Equipment Recommended: Defer to next venue PT Goals  Acute Rehab PT Goals PT Goal: Supine/Side to Sit - Progress: Progressing toward goal PT Goal: Sit to Stand - Progress: Progressing toward goal PT Goal: Ambulate - Progress: Progressing toward goal  PT Treatment Precautions/Restrictions  Precautions Precautions: Knee Required Braces or Orthoses: No Restrictions Weight Bearing Restrictions: Yes RLE Weight Bearing: Weight bearing as tolerated Mobility (including Balance) Bed Mobility Supine to Sit: 4: Min assist;With rails Supine to Sit Details (indicate cue type and reason): A for R LE Sitting - Scoot to Edge of Bed: 5: Supervision Sit to Supine: 4: Min assist;With rail Sit to Supine - Details (indicate cue type and reason): A to lift R LE back up into bed.  Transfers Sit to Stand: 4: Min assist;Other (comment);From bed;With upper extremity assist (MinGuard A) Sit to Stand Details (indicate cue type and reason): Cues for safe technique Stand to Sit: Other (comment);To bed;With upper extremity assist (MinGuard A) Ambulation/Gait Ambulation/Gait Assistance: 4: Min assist;Other (comment) (MinGuard A) Ambulation/Gait Assistance Details (indicate cue type and reason): Cues for gait sequence and posture.  Ambulation Distance (Feet): 120 Feet Assistive device: Rolling walker Gait Pattern: Decreased stride length;Step-to pattern;Trunk flexed    Exercise  Total Joint Exercises Quad Sets: AROM;Right;10 reps Short Arc Quad: 10 reps;Right;AAROM Heel  Slides: AAROM;Right;10 reps Hip ABduction/ADduction: AAROM;Right;10 reps Straight Leg Raises: AAROM;Right;10 reps End of Session PT - End of Session Equipment Utilized During Treatment: Gait belt Activity Tolerance: Patient tolerated treatment well Patient left: in chair Nurse Communication: Mobility status for transfers;Mobility status for ambulation General Behavior During Session: [facility] for tasks performed Cognition: [facility] for tasks performed  Artavis Cowie, Adline Potter 12/18/2011, 3:08 PM 12/18/2011 Fredrich Birks PTA [phone] pager [phone] office

## 2011-12-19 DIAGNOSIS — M1711 Unilateral primary osteoarthritis, right knee: Secondary | ICD-10-CM | POA: Diagnosis present

## 2011-12-19 MED ORDER — WARFARIN SODIUM 5 MG PO TABS: 5.0000 mg | ORAL_TABLET | Freq: Once | ORAL | Status: DC

## 2011-12-19 MED ORDER — WARFARIN SODIUM 7.5 MG PO TABS
7.5000 mg | ORAL_TABLET | Freq: Once | ORAL | Status: DC
  Filled 2011-12-19: qty 1

## 2011-12-19 MED ORDER — OXYCODONE-ACETAMINOPHEN 5-325 MG PO TABS: 1.0000 | ORAL_TABLET | ORAL | Status: AC | PRN

## 2011-12-19 NOTE — Progress Notes (Signed)
Clinical Social Worker received notification from RN that pt needed ambulance transportation to home at discharge. Clinical Social Worker arranged ambulance transportation and notified pt at bedside. No further social work needs at this time. Clinical Social Worker signing off.  Jacklynn Lewis, MSW, LCSWA (coverage)  Clinical Social Work [phone]

## 2011-12-19 NOTE — Discharge Instructions (Signed)
Home Health to be provided thru [facility] [phone]

## 2011-12-19 NOTE — Progress Notes (Signed)
PATIENT ID: Nathaniel [name]  MRN: [id number]  DOB/AGE:  [birth year] / 77 y.o.  4 Days Post-Op Procedure(s) (LRB): TOTAL KNEE ARTHROPLASTY (Right)    PROGRESS NOTE Subjective: Patient is alert, oriented, no Nausea, no Vomiting, yes} passing gas, no Bowel Movement. Taking PO well. Denies SOB, Chest or Calf Pain. Using Incentive Spirometer, PAS in place. Ambulating in the hallway with PT. Patient reports pain as mild  .    Objective: Vital signs in last 24 hours: Filed Vitals:   12/18/11 0434 12/18/11 1318 12/18/11 2211 12/19/11 0601  BP: 136/69 86/52 122/68 117/51  Pulse: 88 99 89 89  Temp: 98.9 F (37.2 C) 98.1 F (36.7 C) 99.4 F (37.4 C) 98.5 F (36.9 C)  TempSrc: Oral     Resp: 20 16 18 18   Height:      Weight:      SpO2: 100% 95% 99% 98%      Intake/Output from previous day: I/O last 3 completed shifts: In: 480 [P.O.:480] Out: 1600 [Urine:1600]   Intake/Output this shift:     LABORATORY DATA:  Basename 12/19/11 0630 12/18/11 0510 12/17/11 0550  WBC -- 12.0* 11.6*  HGB -- 10.9* 11.1*  HCT -- 33.6* 34.3*  PLT -- 170 163  NA -- -- --  K -- -- --  CL -- -- --  CO2 -- -- --  BUN -- -- --  CREATININE -- -- --  GLUCOSE -- -- --  GLUCAP -- -- --  INR 1.49 1.45 --  CALCIUM -- -- --    Examination: Neurologically intact ABD soft Neurovascular intact Sensation intact distally Intact pulses distally Incision: dressing C/D/I}  Assessment:   4 Days Post-Op Procedure(s) (LRB): TOTAL KNEE ARTHROPLASTY (Right) ADDITIONAL DIAGNOSIS:  none  Plan: PT/OT WBAT, CPM 5/hrs day until ROM 0-90 degrees, then D/C CPM DVT Prophylaxis:  Lovenox\Coumadin bridge target INR 1.5-2.0 DISCHARGE PLAN: Home today if PT goals met (SNF if unable to pass PT today) DISCHARGE NEEDS: HHPT, HHRN, CPM, Walker and 3-in-1 comode seat     [name] M. 12/19/2011, 7:40 AM

## 2011-12-19 NOTE — Plan of Care (Signed)
Problem: Consults Goal: Diagnosis- Total Joint Replacement R total knee replacement

## 2011-12-19 NOTE — Progress Notes (Signed)
PT Treatment Note   12/19/11 0859  PT Visit Information  Last PT Received On 12/19/11  Restrictions  RLE Weight Bearing WBAT  Bed Mobility  Supine to Sit 4: Min assist;HOB flat  Supine to Sit Details (indicate cue type and reason) assist for R LE management  Transfers  Sit to Stand 4: Min assist;From bed (contact guard)  Sit to Stand Details (indicate cue type and reason) increased time required  Ambulation/Gait  Ambulation/Gait Assistance 4: Min assist (contact guard)  Ambulation Distance (Feet) 150 Feet (x2)  Assistive device Rolling walker  Gait Pattern Step-to pattern;Decreased step length - right;Decreased stance time - right  Gait velocity decreased, however improved on way back from the gym  Stairs Yes  Stairs Assistance 4: Min assist (contact guard)  Stairs Assistance Details (indicate cue type and reason) verbal cues for sequencing with walker and going up platform step backwards with RW  Stair Management Technique No rails;Backwards;With walker  Number of Stairs 1   Height of Stairs 4   Wheelchair Mobility  Wheelchair Mobility No  Posture/Postural Control  Posture/Postural Control No significant limitations  Total Joint Exercises  Ankle Circles/Pumps AROM;Right;10 reps;Supine  Quad Sets AROM;Right;10 reps;Seated (with LEs elevated)  Heel Slides AROM;AAROM;Right;10 reps;Seated  Straight Leg Raises AAROM;Right;10 reps;Seated (with LEs elevated)  Long Arc Quad AROM;Right;10 reps;Seated  PT - End of Session  Equipment Utilized During Treatment Gait belt  Activity Tolerance Patient tolerated treatment well  Patient left in chair;with call bell in reach  Nurse Communication Mobility status for transfers;Mobility status for ambulation  General  Behavior During Session [facility] for tasks performed  Cognition [facility] for tasks performed  PT - Assessment/Plan  Comments on Treatment Session Patient with significant improvement in both transfer and ambulation mobilty/tolerance.  Patient with improved quad set. Patient demosntrated safe technique for stair negotiation to enter home with assist of wife. Patient safe to return home with spouse today.  PT Plan Discharge plan remains appropriate;Frequency remains appropriate  PT Frequency 7X/week  Follow Up Recommendations Home health PT;Supervision/Assistance - 24 hour  Equipment Recommended Rolling walker with 5" wheels  Acute Rehab PT Goals  PT Goal: Supine/Side to Sit - Progress Progressing toward goal  PT Goal: Sit to Stand - Progress Progressing toward goal  PT Goal: Ambulate - Progress Progressing toward goal  PT Goal: Up/Down Stairs - Progress Progressing toward goal  PT Goal: Perform Home Exercise Program - Progress Progressing toward goal    Pain: 6/10 R knee  Lewis Shock, PT, DPT Pager #: [phone] Office #: [phone]

## 2011-12-19 NOTE — Progress Notes (Addendum)
ANTICOAGULATION CONSULT NOTE -Follow Up  Pharmacy Consult for Coumadin Indication: VTE prophylaxis s/p Right TKA  Allergies  Allergen Reactions  . Advil Hives    Conflict with other meds    Patient Measurements: Height: 5\' 11"  (180.3 cm) Weight: 174 lb 9.7 oz (79.2 kg) IBW/kg (Calculated) : 75.3   Vital Signs: Temp: 98.5 F (36.9 C) (02/15 0601) BP: 117/51 mmHg (02/15 0601) Pulse Rate: 89  (02/15 0601)  Labs:  Basename 12/19/11 0630 12/18/11 0510 12/17/11 0550  HGB -- 10.9* 11.1*  HCT -- 33.6* 34.3*  PLT -- 170 163  APTT -- -- --  LABPROT 18.3* 17.9* 18.3*  INR 1.49 1.45 1.49  HEPARINUNFRC -- -- --  CREATININE -- -- --  CKTOTAL -- -- --  CKMB -- -- --  TROPONINI -- -- --   Estimated Creatinine Clearance: 86.3 ml/min (by C-G formula based on Cr of 0.78).  Medical History: Past Medical History  Diagnosis Date  . Arthritis   . Environmental allergies     pollen , leaves, grass  . Blood transfusion 2005    s/p hip replacement  . Glaucoma of left eye     legally blind in left eye  . Colon polyps   . Erectile dysfunction     Medications:  Scheduled:     . baclofen  10 mg Oral QID  . brimonidine  1 drop Both Eyes QAC breakfast  . dorzolamide-timolol  1 drop Both Eyes BID  . enoxaparin  30 mg Subcutaneous Q12H  . gabapentin  800 mg Oral QID  . loratadine  10 mg Oral Daily  . patient's guide to using coumadin book   Does not apply Once  . Travoprost (BAK Free)  1 drop Both Eyes QHS  . warfarin  5 mg Oral ONCE-1800  . warfarin   Does not apply Once    Assessment: 23 YOM s/p right total knee replacement POD#4, on  coumadin for VTE prophylaxis. Today INR is 1.49, little movement past 3 days. Goal INR 1.5-2. Bridge with lovenox 30 mg q 12hrs until INR =/> 1.8.  CBC stable. No bleeding reported.   **Of note, last night's dose was not charted.  Unsure if patient received it.**  Goal of Therapy:  INR 1.5-2   Plan:  1. Coumadin 7.5 mg po x 1 2. F/u INR  daily.  3. Will d/c Lovenox when INR =/> 1.8 per MD's orders.  Reece Leader, Pharm D 12/19/2011 9:55 AM

## 2011-12-19 NOTE — Progress Notes (Signed)
Clinical Social Work-CSW completed full psychosocial assessment to be placed in shadow chart. Pt has 24 hour care at home, CSW discussed ST rehab at length at bedside with pt and wife. Pt ans wife decline SNF and will d/c home with home health. No further needs-signing off.Jodean Lima, [phone]

## 2011-12-19 NOTE — Discharge Summary (Signed)
Patient ID: Nathaniel [name] MRN: [id number] DOB/AGE: 11-26-1934 77 y.o.  Admit date: 12/15/2011 Discharge date: 12/19/2011  Admission Diagnoses:  Active Problems:  Osteoarthritis of right knee   Discharge Diagnoses:  Same  Past Medical History  Diagnosis Date  . Arthritis   . Environmental allergies     pollen , leaves, grass  . Blood transfusion 2005    s/p hip replacement  . Glaucoma of left eye     legally blind in left eye  . Colon polyps   . Erectile dysfunction     Surgeries: Procedure(s): TOTAL KNEE ARTHROPLASTY on 12/15/2011   Consultants:    Discharged Condition: Improved  Hospital Course: Nathaniel [name] is an 77 y.o. male who was admitted 12/15/2011 for operative treatment of<principal problem not specified>. Patient has severe unremitting pain that affects sleep, daily activities, and work/hobbies. After pre-op clearance the patient was taken to the operating room on 12/15/2011 and underwent  Procedure(s): TOTAL KNEE ARTHROPLASTY.    Patient was given perioperative antibiotics: Anti-infectives     Start     Dose/Rate Route Frequency Ordered Stop   12/15/11 1213   cefUROXime (ZINACEF) injection  Status:  Discontinued          As needed 12/15/11 1214 12/15/11 1314   12/14/11 1500   ceFAZolin (ANCEF) IVPB 1 g/50 mL premix        1 g 100 mL/hr over 30 Minutes Intravenous 60 min pre-op 12/14/11 1454 12/15/11 1132           Patient was given sequential compression devices, early ambulation, and chemoprophylaxis to prevent DVT.  Patient benefited maximally from hospital stay and there were no complications.    Recent vital signs: Patient Vitals for the past 24 hrs:  BP Temp Pulse Resp SpO2  12/19/11 0601 117/51 mmHg 98.5 F (36.9 C) 89  18  98 %  12-31-2011 2211 122/68 mmHg 99.4 F (37.4 C) 89  18  99 %  Dec 31, 2011 1318 86/52 mmHg 98.1 F (36.7 C) 99  16  95 %     Recent laboratory studies:  Basename 12/19/11 0630 12-31-2011 0510 12/17/11 0550  WBC -- 12.0*  11.6*  HGB -- 10.9* 11.1*  HCT -- 33.6* 34.3*  PLT -- 170 163  NA -- -- --  K -- -- --  CL -- -- --  CO2 -- -- --  BUN -- -- --  CREATININE -- -- --  GLUCOSE -- -- --  INR 1.49 1.45 --  CALCIUM -- -- --     Discharge Medications:   Medication List  As of 12/19/2011 12:59 PM   STOP taking these medications         HYDROcodone-acetaminophen 10-325 MG per tablet         TAKE these medications         baclofen 10 MG tablet   Commonly known as: LIORESAL   Take 10 mg by mouth 4 (four) times daily.      brimonidine 0.15 % ophthalmic solution   Commonly known as: ALPHAGAN   Place 1 drop into both eyes daily before breakfast.      cetirizine 10 MG tablet   Commonly known as: ZYRTEC   Take 10 mg by mouth daily.      chlorzoxazone 500 MG tablet   Commonly known as: PARAFON   Take 500 mg by mouth 3 (three) times daily as needed.      dorzolamide-timolol 22.3-6.8 MG/ML ophthalmic solution   Commonly known  as: COSOPT   Place 1 drop into both eyes 2 (two) times daily.      gabapentin 800 MG tablet   Commonly known as: NEURONTIN   Take 800 mg by mouth 4 (four) times daily.      oxyCODONE-acetaminophen 5-325 MG per tablet   Commonly known as: PERCOCET   Take 1-2 tablets by mouth every 4 (four) hours as needed.      Travoprost (BAK Free) 0.004 % Soln ophthalmic solution   Commonly known as: TRAVATAN   Place 1 drop into both eyes at bedtime.      warfarin 5 MG tablet   Commonly known as: COUMADIN   Take 1 tablet (5 mg total) by mouth one time only at 6 PM.            Diagnostic Studies: No results found.  Disposition:   Discharge Orders    Future Orders Please Complete By Expires   Diet - low sodium heart healthy      Increase activity slowly      Walker       May shower / Bathe      Driving Restrictions      Comments:   No driving for 2 weeks.   Change dressing (specify)      Comments:   Dressing change as needed.   Call MD for:  temperature >100.4        Call MD for:  severe uncontrolled pain      Call MD for:  redness, tenderness, or signs of infection (pain, swelling, redness, odor or green/yellow discharge around incision site)      Discharge instructions      Comments:   Weightbearing as tolerated.  F/U with Dr. Turner Daniels in 10-12 days.         SignedHazle Nordmann. 12/19/2011, 12:59 PM

## 2011-12-29 NOTE — Progress Notes (Signed)
Physical Therapy Treatment Patient Details Name: Nathaniel [name] MRN: [id number] DOB: 13-Dec-1934 Today's Date: 12/18/2011  PT Assessment/Plan  PT - Assessment/Plan Comments on Treatment Session: Pt able to increase ambulation and participate more with therapy this afternoon. Pt stated that he is agreeable to [facility]SNF at [facility].  PT Plan: Discharge plan remains appropriate PT Frequency: 7X/week Follow Up Recommendations: Skilled nursing facility Equipment Recommended: Defer to next venue PT Goals  Acute Rehab PT Goals PT Goal: Supine/Side to Sit - Progress: Progressing toward goal PT Goal: Sit to Stand - Progress: Progressing toward goal PT Goal: Ambulate - Progress: Progressing toward goal  PT Treatment Precautions/Restrictions  Precautions Precautions: Knee Required Braces or Orthoses: No Restrictions Weight Bearing Restrictions: Yes RLE Weight Bearing: Weight bearing as tolerated Mobility (including Balance) Bed Mobility Supine to Sit: 4: Min assist;With rails Supine to Sit Details (indicate cue type and reason): A for R LE Sitting - Scoot to Edge of Bed: 5: Supervision Sit to Supine: 4: Min assist;With rail Sit to Supine - Details (indicate cue type and reason): A to lift R LE back up into bed.  Transfers Sit to Stand: 4: Min assist;Other (comment);From bed;With upper extremity assist (MinGuard A) Sit to Stand Details (indicate cue type and reason): Cues for safe technique Stand to Sit: Other (comment);To bed;With upper extremity assist (MinGuard A) Ambulation/Gait Ambulation/Gait Assistance: 4: Min assist;Other (comment) (MinGuard A) Ambulation/Gait Assistance Details (indicate cue type and reason): Cues for gait sequence and posture.  Ambulation Distance (Feet): 120 Feet Assistive device: Rolling walker Gait Pattern: Decreased stride length;Step-to pattern;Trunk flexed    Exercise  Total Joint Exercises Quad Sets: AROM;Right;10 reps Short Arc Quad: 10 reps;Right;AAROM Heel  Slides: AAROM;Right;10 reps Hip ABduction/ADduction: AAROM;Right;10 reps Straight Leg Raises: AAROM;Right;10 reps End of Session PT - End of Session Equipment Utilized During Treatment: Gait belt Activity Tolerance: Patient tolerated treatment well Patient left: in chair Nurse Communication: Mobility status for transfers;Mobility status for ambulation General Behavior During Session: [facility] for tasks performed Cognition: [facility] for tasks performed  Robinette, Adline Potter 12/18/2011, 3:08 PM 12/18/2011 Fredrich Birks PTA [phone] pager [phone] office  Lewis Shock, PT, DPT Pager #: [phone] Office #: [phone]

## 2012-01-19 ENCOUNTER — Ambulatory Visit: Payer: Medicare Other | Attending: Orthopedic Surgery | Admitting: Physical Therapy

## 2012-01-19 DIAGNOSIS — M25569 Pain in unspecified knee: Secondary | ICD-10-CM | POA: Insufficient documentation

## 2012-01-19 DIAGNOSIS — M25669 Stiffness of unspecified knee, not elsewhere classified: Secondary | ICD-10-CM | POA: Insufficient documentation

## 2012-01-19 DIAGNOSIS — IMO0001 Reserved for inherently not codable concepts without codable children: Secondary | ICD-10-CM | POA: Insufficient documentation

## 2012-01-19 DIAGNOSIS — R262 Difficulty in walking, not elsewhere classified: Secondary | ICD-10-CM | POA: Insufficient documentation

## 2012-01-20 ENCOUNTER — Ambulatory Visit: Payer: Medicare Other | Admitting: Physical Therapy

## 2012-01-21 ENCOUNTER — Ambulatory Visit: Payer: Medicare Other | Admitting: Physical Therapy

## 2012-01-23 ENCOUNTER — Ambulatory Visit: Payer: Medicare Other | Admitting: Physical Therapy

## 2012-01-26 ENCOUNTER — Ambulatory Visit: Payer: Medicare Other | Admitting: Physical Therapy

## 2012-02-02 ENCOUNTER — Ambulatory Visit: Payer: Medicare Other | Attending: Orthopedic Surgery | Admitting: Physical Therapy

## 2012-02-02 DIAGNOSIS — M25569 Pain in unspecified knee: Secondary | ICD-10-CM | POA: Insufficient documentation

## 2012-02-02 DIAGNOSIS — R262 Difficulty in walking, not elsewhere classified: Secondary | ICD-10-CM | POA: Insufficient documentation

## 2012-02-02 DIAGNOSIS — M25669 Stiffness of unspecified knee, not elsewhere classified: Secondary | ICD-10-CM | POA: Insufficient documentation

## 2012-02-02 DIAGNOSIS — IMO0001 Reserved for inherently not codable concepts without codable children: Secondary | ICD-10-CM | POA: Insufficient documentation

## 2012-02-05 ENCOUNTER — Ambulatory Visit: Payer: Medicare Other | Admitting: Physical Therapy

## 2012-02-09 ENCOUNTER — Ambulatory Visit: Payer: Medicare Other | Admitting: Physical Therapy

## 2012-02-12 ENCOUNTER — Ambulatory Visit: Payer: Medicare Other | Admitting: Physical Therapy

## 2012-02-16 ENCOUNTER — Ambulatory Visit: Payer: Medicare Other | Admitting: Physical Therapy

## 2012-02-19 ENCOUNTER — Ambulatory Visit: Payer: Medicare Other | Admitting: Physical Therapy

## 2012-02-26 ENCOUNTER — Ambulatory Visit: Payer: Medicare Other | Admitting: Physical Therapy

## 2012-03-01 ENCOUNTER — Ambulatory Visit: Payer: Medicare Other | Admitting: Physical Therapy

## 2013-02-03 ENCOUNTER — Emergency Department (HOSPITAL_BASED_OUTPATIENT_CLINIC_OR_DEPARTMENT_OTHER)
Admission: EM | Admit: 2013-02-03 | Discharge: 2013-02-03 | Disposition: A | Discharge status: Home / Self Care | Payer: Medicare Other | Attending: Emergency Medicine | Admitting: Emergency Medicine

## 2013-02-03 ENCOUNTER — Encounter (HOSPITAL_BASED_OUTPATIENT_CLINIC_OR_DEPARTMENT_OTHER): Payer: Self-pay | Admitting: Emergency Medicine

## 2013-02-03 DIAGNOSIS — Z87891 Personal history of nicotine dependence: Secondary | ICD-10-CM | POA: Insufficient documentation

## 2013-02-03 DIAGNOSIS — Z8669 Personal history of other diseases of the nervous system and sense organs: Secondary | ICD-10-CM | POA: Insufficient documentation

## 2013-02-03 DIAGNOSIS — Z87448 Personal history of other diseases of urinary system: Secondary | ICD-10-CM | POA: Insufficient documentation

## 2013-02-03 DIAGNOSIS — R338 Other retention of urine: Secondary | ICD-10-CM

## 2013-02-03 DIAGNOSIS — Z860100 Personal history of colon polyps, unspecified: Secondary | ICD-10-CM | POA: Insufficient documentation

## 2013-02-03 DIAGNOSIS — Z79899 Other long term (current) drug therapy: Secondary | ICD-10-CM | POA: Insufficient documentation

## 2013-02-03 DIAGNOSIS — R339 Retention of urine, unspecified: Secondary | ICD-10-CM | POA: Insufficient documentation

## 2013-02-03 DIAGNOSIS — R5381 Other malaise: Secondary | ICD-10-CM | POA: Insufficient documentation

## 2013-02-03 DIAGNOSIS — Z8739 Personal history of other diseases of the musculoskeletal system and connective tissue: Secondary | ICD-10-CM | POA: Insufficient documentation

## 2013-02-03 DIAGNOSIS — R209 Unspecified disturbances of skin sensation: Secondary | ICD-10-CM | POA: Insufficient documentation

## 2013-02-03 LAB — COMPREHENSIVE METABOLIC PANEL
AST: 18 U/L (ref 0–37)
CO2: 24 mEq/L (ref 19–32)
Calcium: 9.7 mg/dL (ref 8.4–10.5)
Creatinine, Ser: 1.3 mg/dL (ref 0.50–1.35)
GFR calc Af Amer: 60 mL/min — ABNORMAL LOW (ref >90)
GFR calc non Af Amer: 52 mL/min — ABNORMAL LOW (ref >90)
Total Protein: 8.3 g/dL (ref 6.0–8.3)

## 2013-02-03 LAB — CBC WITH DIFFERENTIAL/PLATELET
Basophils Absolute: 0 10*3/uL (ref 0.0–0.1)
Basophils Relative: 0 % (ref 0–1)
Eosinophils Absolute: 0.4 10*3/uL (ref 0.0–0.7)
Eosinophils Relative: 4 % (ref 0–5)
HCT: 44.3 % (ref 39.0–52.0)
Lymphocytes Relative: 22 % (ref 12–46)
MCH: 29.8 pg (ref 26.0–34.0)
MCHC: 32.7 g/dL (ref 30.0–36.0)
MCV: 91 fL (ref 78.0–100.0)
Monocytes Absolute: 1.4 10*3/uL — ABNORMAL HIGH (ref 0.1–1.0)
Platelets: 314 10*3/uL (ref 150–400)
RDW: 14.1 % (ref 11.5–15.5)

## 2013-02-03 LAB — URINALYSIS, ROUTINE W REFLEX MICROSCOPIC
Hgb urine dipstick: NEGATIVE
Nitrite: NEGATIVE
Specific Gravity, Urine: 1.026 (ref 1.005–1.030)
Urobilinogen, UA: 0.2 mg/dL (ref 0.0–1.0)
pH: 5 (ref 5.0–8.0)

## 2013-02-03 LAB — TROPONIN I: Troponin I: <0.3 ng/mL (ref <0.30)

## 2013-02-03 NOTE — ED Notes (Signed)
MD at bedside. 

## 2013-02-03 NOTE — ED Provider Notes (Signed)
History     CSN: [id number]  Arrival date & time 02/03/13  1017   First MD Initiated Contact with Patient 02/03/13 1047      Chief Complaint  Patient presents with  . Urinary Retention  . Weakness    (Consider location/radiation/quality/duration/timing/severity/associated sxs/prior treatment) Patient is a 78 y.o. male presenting with male genitourinary complaint. The history is provided by the patient, the spouse and medical records. No language interpreter was used.  Male GU Problem Primary symptoms comment: Pt has been unable to urinate since yesteray, only able to dribble a little urine.. This is a new problem. The current episode started yesterday. The problem has not changed since onset.Penile discharge characteristics: No discharge. Associated symptoms comments: He has tingling in his hands and leg cramps.  . There has been no fever. He has tried nothing for the symptoms.    Past Medical History  Diagnosis Date  . Arthritis   . Environmental allergies     pollen , leaves, grass  . Blood transfusion 2005    s/p hip replacement  . Glaucoma of left eye     legally blind in left eye  . Colon polyps   . Erectile dysfunction     Past Surgical History  Procedure Laterality Date  . Joint replacement    . Multiple tooth extractions  2012    had 23 teeth pulled  . Eye surgery    . Cataract extraction      bilateral  . Glaucoma valve insertion  5621,3086    Left eye  . Hip arthroplasty  2005    Right Hip replacement x 3  . Total knee arthroplasty  12/15/2011    Procedure: TOTAL KNEE ARTHROPLASTY;  Surgeon: [name], MD;  Location: [facility] OR;  Service: Orthopedics;  Laterality: Right;  DEPUY SIGMA   . Toenail excision      No family history on file.  History  Substance Use Topics  . Smoking status: Former Smoker -- 0.25 packs/day for 40 years    Types: Cigarettes    Quit date: 12/07/1998  . Smokeless tobacco: Former Neurosurgeon    Types: Chew  . Alcohol Use: Yes      Comment: occasional beer      Review of Systems  Constitutional: Negative for fever and chills.  HENT: Negative.   Eyes: Negative.   Respiratory: Negative.   Cardiovascular: Negative for chest pain, palpitations and leg swelling.  Gastrointestinal: Negative.   Genitourinary: Positive for decreased urine volume and difficulty urinating.  Musculoskeletal: Negative.   Skin: Negative.   Neurological:       Tingling in hands, leg cramping.  Psychiatric/Behavioral: Negative.     Allergies  Ibuprofen  Home Medications   Current Outpatient Rx  Name  Route  Sig  Dispense  Refill  . baclofen (LIORESAL) 10 MG tablet   Oral   Take 10 mg by mouth 4 (four) times daily.         . brimonidine (ALPHAGAN) 0.15 % ophthalmic solution   Both Eyes   Place 1 drop into both eyes daily before breakfast.         . cetirizine (ZYRTEC) 10 MG tablet   Oral   Take 10 mg by mouth daily.         . chlorzoxazone (PARAFON) 500 MG tablet   Oral   Take 500 mg by mouth 3 (three) times daily as needed.         . dorzolamide-timolol (COSOPT) 22.3-6.8 MG/ML  ophthalmic solution   Both Eyes   Place 1 drop into both eyes 2 (two) times daily.         Marland Kitchen gabapentin (NEURONTIN) 800 MG tablet   Oral   Take 800 mg by mouth 4 (four) times daily.         . Travoprost, BAK Free, (TRAVATAN) 0.004 % SOLN ophthalmic solution   Both Eyes   Place 1 drop into both eyes at bedtime.         Marland Kitchen EXPIRED: warfarin (COUMADIN) 5 MG tablet   Oral   Take 1 tablet (5 mg total) by mouth one time only at 6 PM.   30 tablet   0     BP 177/79  Temp(Src) 97.9 F (36.6 C) (Oral)  Resp 16  Ht 5\' 11"  (1.803 m)  Wt 171 lb (77.565 kg)  BMI 23.86 kg/m2  SpO2 100%  Physical Exam  Nursing note and vitals reviewed. Constitutional: He is oriented to person, place, and time. He appears well-developed and well-nourished. No distress.  Pleasant elderly man, complaining mainly of inability to urinate.  HENT:   Head: Normocephalic and atraumatic.  Right Ear: External ear normal.  Left Ear: External ear normal.  Mouth/Throat: Oropharynx is clear and moist.  Eyes: Conjunctivae and EOM are normal. Pupils are equal, round, and reactive to light.  Neck: Normal range of motion. Neck supple.  Cardiovascular: Normal rate, regular rhythm and normal heart sounds.   Pulmonary/Chest: Effort normal and breath sounds normal.  Abdominal: Soft. Bowel sounds are normal.  Genitourinary: Penis normal.  Musculoskeletal: Normal range of motion. He exhibits no edema and no tenderness.  Neurological: He is alert and oriented to person, place, and time.  No sensory or motor deficit.  Skin: Skin is warm and dry.  Psychiatric: He has a normal mood and affect. His behavior is normal.    ED Course  BLADDER CATHETERIZATION Date/Time: 02/03/2013 11:20 AM Performed by: Osvaldo Human Authorized by: Osvaldo Human Consent: Verbal consent obtained. Risks and benefits: risks, benefits and alternatives were discussed Consent given by: patient Patient understanding: patient states understanding of the procedure being performed Patient consent: the patient's understanding of the procedure matches consent given Site marked: the operative site was not marked Patient identity confirmed: verbally with patient Indications: urinary retention Local anesthesia used: no Patient sedated: no Preparation: Patient was prepped and draped in the usual sterile fashion. Catheter insertion: indwelling Catheter type: Foley Catheter size: 16 Fr Complicated insertion: no Altered anatomy: no Bladder irrigation: no Number of attempts: 1 Urine characteristics: clear Patient tolerance: Patient tolerated the procedure well with no immediate complications.   (including critical care time)    Date: 02/03/2013  Rate:55  Rhythm: sinus bradycardia  QRS Axis: normal  Intervals: normal  ST/T Wave abnormalities: normal  Conduction  Disutrbances:none  Narrative Interpretation: Normal EKg  Old EKG Reviewed: unchanged Results for orders placed during the hospital encounter of 02/03/13  CBC WITH DIFFERENTIAL      Result Value Range   WBC 9.7  4.0 - 10.5 K/uL   RBC 4.87  4.22 - 5.81 MIL/uL   Hemoglobin 14.5  13.0 - 17.0 g/dL   HCT 44.0  10.2 - 72.5 %   MCV 91.0  78.0 - 100.0 fL   MCH 29.8  26.0 - 34.0 pg   MCHC 32.7  30.0 - 36.0 g/dL   RDW 36.6  44.0 - 34.7 %   Platelets 314  150 - 400 K/uL  Neutrophils Relative 59  43 - 77 %   Neutro Abs 5.7  1.7 - 7.7 K/uL   Lymphocytes Relative 22  12 - 46 %   Lymphs Abs 2.2  0.7 - 4.0 K/uL   Monocytes Relative 14 (*) 3 - 12 %   Monocytes Absolute 1.4 (*) 0.1 - 1.0 K/uL   Eosinophils Relative 4  0 - 5 %   Eosinophils Absolute 0.4  0.0 - 0.7 K/uL   Basophils Relative 0  0 - 1 %   Basophils Absolute 0.0  0.0 - 0.1 K/uL  COMPREHENSIVE METABOLIC PANEL      Result Value Range   Sodium 140  135 - 145 mEq/L   Potassium 3.9  3.5 - 5.1 mEq/L   Chloride 104  96 - 112 mEq/L   CO2 24  19 - 32 mEq/L   Glucose, Bld 104 (*) 70 - 99 mg/dL   BUN 16  6 - 23 mg/dL   Creatinine, Ser 1.61  0.50 - 1.35 mg/dL   Calcium 9.7  8.4 - 09.6 mg/dL   Total Protein 8.3  6.0 - 8.3 g/dL   Albumin 3.7  3.5 - 5.2 g/dL   AST 18  0 - 37 U/L   ALT 7  0 - 53 U/L   Alkaline Phosphatase 107  39 - 117 U/L   Total Bilirubin 0.4  0.3 - 1.2 mg/dL   GFR calc non Af Amer 52 (*) >90 mL/min   GFR calc Af Amer 60 (*) >90 mL/min  URINALYSIS, ROUTINE W REFLEX MICROSCOPIC      Result Value Range   Color, Urine AMBER (*) YELLOW   APPearance CLEAR  CLEAR   Specific Gravity, Urine 1.026  1.005 - 1.030   pH 5.0  5.0 - 8.0   Glucose, UA NEGATIVE  NEGATIVE mg/dL   Hgb urine dipstick NEGATIVE  NEGATIVE   Bilirubin Urine SMALL (*) NEGATIVE   Ketones, ur 15 (*) NEGATIVE mg/dL   Protein, ur NEGATIVE  NEGATIVE mg/dL   Urobilinogen, UA 0.2  0.0 - 1.0 mg/dL   Nitrite NEGATIVE  NEGATIVE   Leukocytes, UA NEGATIVE  NEGATIVE   TROPONIN I      Result Value Range   Troponin I <0.30  <0.30 ng/mL   Lab workup for his vague symptoms was entirely negative.   Advised leg bag, urologic followup for removal of catheter and trial of voiding in 3-5 days. .    1. Acute urinary retention           Carleene Cooper III, MD 02/03/13 2306

## 2013-02-03 NOTE — ED Notes (Signed)
C/o tingling and weakness in both hands/arms since yesterday.  Had slight h/a yesterday and felt like he was going to pass out.  Felt "unbalanced" in his gait yesterday. Denies slurred speech.   Also c/o not urinating since yesterday morning.

## 2013-02-03 NOTE — ED Notes (Signed)
Pt reports having generalized fatigue and overall not feeling well.

## 2015-03-30 ENCOUNTER — Ambulatory Visit
Admission: RE | Admit: 2015-03-30 | Discharge: 2015-03-30 | Disposition: A | Discharge status: Home / Self Care | Payer: Medicare Other | Source: Ambulatory Visit | Attending: Family Medicine | Admitting: Family Medicine

## 2015-03-30 ENCOUNTER — Other Ambulatory Visit: Payer: Self-pay | Admitting: Family Medicine

## 2015-03-30 DIAGNOSIS — J209 Acute bronchitis, unspecified: Secondary | ICD-10-CM

## 2015-04-19 ENCOUNTER — Encounter (HOSPITAL_BASED_OUTPATIENT_CLINIC_OR_DEPARTMENT_OTHER): Payer: Self-pay

## 2015-04-19 ENCOUNTER — Emergency Department (HOSPITAL_BASED_OUTPATIENT_CLINIC_OR_DEPARTMENT_OTHER): Payer: Medicare Other

## 2015-04-19 ENCOUNTER — Observation Stay (HOSPITAL_BASED_OUTPATIENT_CLINIC_OR_DEPARTMENT_OTHER)
Admission: EM | Admit: 2015-04-19 | Discharge: 2015-04-21 | Disposition: A | Discharge status: Home / Self Care | Payer: Medicare Other | Attending: Internal Medicine | Admitting: Internal Medicine

## 2015-04-19 DIAGNOSIS — H409 Unspecified glaucoma: Secondary | ICD-10-CM | POA: Diagnosis not present

## 2015-04-19 DIAGNOSIS — I10 Essential (primary) hypertension: Secondary | ICD-10-CM | POA: Diagnosis not present

## 2015-04-19 DIAGNOSIS — Z886 Allergy status to analgesic agent status: Secondary | ICD-10-CM | POA: Insufficient documentation

## 2015-04-19 DIAGNOSIS — I639 Cerebral infarction, unspecified: Secondary | ICD-10-CM

## 2015-04-19 DIAGNOSIS — H5442 Blindness, left eye, normal vision right eye: Secondary | ICD-10-CM | POA: Insufficient documentation

## 2015-04-19 DIAGNOSIS — R29898 Other symptoms and signs involving the musculoskeletal system: Secondary | ICD-10-CM | POA: Diagnosis present

## 2015-04-19 DIAGNOSIS — Z79899 Other long term (current) drug therapy: Secondary | ICD-10-CM | POA: Diagnosis not present

## 2015-04-19 DIAGNOSIS — Z87891 Personal history of nicotine dependence: Secondary | ICD-10-CM | POA: Insufficient documentation

## 2015-04-19 DIAGNOSIS — G8929 Other chronic pain: Secondary | ICD-10-CM | POA: Insufficient documentation

## 2015-04-19 DIAGNOSIS — R531 Weakness: Secondary | ICD-10-CM | POA: Insufficient documentation

## 2015-04-19 DIAGNOSIS — Z96641 Presence of right artificial hip joint: Secondary | ICD-10-CM | POA: Diagnosis not present

## 2015-04-19 DIAGNOSIS — N529 Male erectile dysfunction, unspecified: Secondary | ICD-10-CM | POA: Diagnosis not present

## 2015-04-19 DIAGNOSIS — R42 Dizziness and giddiness: Secondary | ICD-10-CM | POA: Insufficient documentation

## 2015-04-19 DIAGNOSIS — R55 Syncope and collapse: Secondary | ICD-10-CM | POA: Diagnosis not present

## 2015-04-19 DIAGNOSIS — M1711 Unilateral primary osteoarthritis, right knee: Secondary | ICD-10-CM | POA: Diagnosis present

## 2015-04-19 DIAGNOSIS — M545 Low back pain: Secondary | ICD-10-CM | POA: Diagnosis not present

## 2015-04-19 DIAGNOSIS — M179 Osteoarthritis of knee, unspecified: Secondary | ICD-10-CM | POA: Diagnosis not present

## 2015-04-19 DIAGNOSIS — Z7901 Long term (current) use of anticoagulants: Secondary | ICD-10-CM | POA: Diagnosis not present

## 2015-04-19 DIAGNOSIS — R200 Anesthesia of skin: Secondary | ICD-10-CM

## 2015-04-19 LAB — CBC
HCT: 38.8 % — ABNORMAL LOW (ref 39.0–52.0)
HEMATOCRIT: 41.9 % (ref 39.0–52.0)
HEMOGLOBIN: 13.2 g/dL (ref 13.0–17.0)
Hemoglobin: 12.8 g/dL — ABNORMAL LOW (ref 13.0–17.0)
MCH: 31.9 pg (ref 26.0–34.0)
MCH: 31 pg (ref 26.0–34.0)
MCHC: 33 g/dL (ref 30.0–36.0)
MCHC: 31.5 g/dL (ref 30.0–36.0)
MCV: 96.8 fL (ref 78.0–100.0)
MCV: 98.4 fL (ref 78.0–100.0)
PLATELETS: 273 10*3/uL (ref 150–400)
PLATELETS: 266 10*3/uL (ref 150–400)
RBC: 4.01 MIL/uL — ABNORMAL LOW (ref 4.22–5.81)
RBC: 4.26 MIL/uL (ref 4.22–5.81)
RDW: 14.7 % (ref 11.5–15.5)
RDW: 15 % (ref 11.5–15.5)
WBC: 7.1 10*3/uL (ref 4.0–10.5)
WBC: 7.3 10*3/uL (ref 4.0–10.5)

## 2015-04-19 LAB — URINALYSIS, ROUTINE W REFLEX MICROSCOPIC
BILIRUBIN URINE: NEGATIVE
GLUCOSE, UA: NEGATIVE mg/dL
HGB URINE DIPSTICK: NEGATIVE
Ketones, ur: NEGATIVE mg/dL
Leukocytes, UA: NEGATIVE
NITRITE: NEGATIVE
PROTEIN: NEGATIVE mg/dL
SPECIFIC GRAVITY, URINE: 1.015 (ref 1.005–1.030)
Urobilinogen, UA: 0.2 mg/dL (ref 0.0–1.0)
pH: 7 (ref 5.0–8.0)

## 2015-04-19 LAB — COMPREHENSIVE METABOLIC PANEL
ALT: 10 U/L — AB (ref 17–63)
AST: 16 U/L (ref 15–41)
Albumin: 3.5 g/dL (ref 3.5–5.0)
Alkaline Phosphatase: 79 U/L (ref 38–126)
Anion gap: 7 (ref 5–15)
BILIRUBIN TOTAL: 0.5 mg/dL (ref 0.3–1.2)
BUN: 9 mg/dL (ref 6–20)
CHLORIDE: 110 mmol/L (ref 101–111)
CO2: 23 mmol/L (ref 22–32)
Calcium: 9 mg/dL (ref 8.9–10.3)
Creatinine, Ser: 0.72 mg/dL (ref 0.61–1.24)
Glucose, Bld: 111 mg/dL — ABNORMAL HIGH (ref 65–99)
Potassium: 4.1 mmol/L (ref 3.5–5.1)
SODIUM: 140 mmol/L (ref 135–145)
Total Protein: 6.8 g/dL (ref 6.5–8.1)

## 2015-04-19 LAB — APTT: aPTT: 36 seconds (ref 24–37)

## 2015-04-19 LAB — DIFFERENTIAL
Basophils Absolute: 0 10*3/uL (ref 0.0–0.1)
Basophils Relative: 0 % (ref 0–1)
EOS PCT: 11 % — AB (ref 0–5)
Eosinophils Absolute: 0.8 10*3/uL — ABNORMAL HIGH (ref 0.0–0.7)
Lymphocytes Relative: 20 % (ref 12–46)
Lymphs Abs: 1.4 10*3/uL (ref 0.7–4.0)
MONO ABS: 0.9 10*3/uL (ref 0.1–1.0)
Monocytes Relative: 13 % — ABNORMAL HIGH (ref 3–12)
NEUTROS ABS: 4 10*3/uL (ref 1.7–7.7)
Neutrophils Relative %: 56 % (ref 43–77)

## 2015-04-19 LAB — CREATININE, SERUM
Creatinine, Ser: 0.8 mg/dL (ref 0.61–1.24)
GFR calc Af Amer: >60 mL/min (ref >60)

## 2015-04-19 LAB — RAPID URINE DRUG SCREEN, HOSP PERFORMED
Amphetamines: NOT DETECTED
BARBITURATES: NOT DETECTED
BENZODIAZEPINES: NOT DETECTED
COCAINE: NOT DETECTED
Opiates: POSITIVE — AB
Tetrahydrocannabinol: NOT DETECTED

## 2015-04-19 LAB — PROTIME-INR
INR: 1.07 (ref 0.00–1.49)
PROTHROMBIN TIME: 14.1 s (ref 11.6–15.2)

## 2015-04-19 LAB — TROPONIN I: Troponin I: <0.03 ng/mL (ref <0.031)

## 2015-04-19 LAB — ETHANOL

## 2015-04-19 MED ORDER — GABAPENTIN 400 MG PO CAPS
800.0000 mg | ORAL_CAPSULE | Freq: Three times a day (TID) | ORAL | Status: DC
  Administered 2015-04-19 – 2015-04-21 (×7): 800 mg via ORAL
  Filled 2015-04-19 (×8): qty 2

## 2015-04-19 MED ORDER — CLOPIDOGREL BISULFATE 75 MG PO TABS
75.0000 mg | ORAL_TABLET | Freq: Every day | ORAL | Status: DC
Start: 2015-04-19 — End: 2015-04-21
  Administered 2015-04-19 – 2015-04-21 (×3): 75 mg via ORAL
  Filled 2015-04-19 (×3): qty 1

## 2015-04-19 MED ORDER — GABAPENTIN 800 MG PO TABS
800.0000 mg | ORAL_TABLET | Freq: Four times a day (QID) | ORAL | Status: DC
  Filled 2015-04-19 (×2): qty 1

## 2015-04-19 MED ORDER — LORATADINE 10 MG PO TABS
10.0000 mg | ORAL_TABLET | Freq: Every day | ORAL | Status: DC
  Administered 2015-04-19 – 2015-04-21 (×3): 10 mg via ORAL
  Filled 2015-04-19 (×3): qty 1

## 2015-04-19 MED ORDER — STROKE: EARLY STAGES OF RECOVERY BOOK
Freq: Once | Status: AC
Start: 2015-04-19 — End: 2015-04-19
  Administered 2015-04-19: 18:00:00

## 2015-04-19 MED ORDER — FOLIC ACID 1 MG PO TABS
1.0000 mg | ORAL_TABLET | Freq: Every day | ORAL | Status: DC
  Administered 2015-04-20: 1 mg via ORAL
  Filled 2015-04-19: qty 1

## 2015-04-19 MED ORDER — HYDROCODONE-ACETAMINOPHEN 10-325 MG PO TABS
1.0000 | ORAL_TABLET | ORAL | Status: DC | PRN
  Administered 2015-04-20 – 2015-04-21 (×2): 1 via ORAL
  Filled 2015-04-19 (×3): qty 1

## 2015-04-19 MED ORDER — LATANOPROST 0.005 % OP SOLN
1.0000 [drp] | Freq: Every day | OPHTHALMIC | Status: DC
  Administered 2015-04-19 – 2015-04-20 (×2): 1 [drp] via OPHTHALMIC
  Filled 2015-04-19: qty 2.5

## 2015-04-19 MED ORDER — DORZOLAMIDE HCL-TIMOLOL MAL 2-0.5 % OP SOLN
1.0000 [drp] | Freq: Two times a day (BID) | OPHTHALMIC | Status: DC
  Administered 2015-04-19 – 2015-04-21 (×4): 1 [drp] via OPHTHALMIC
  Filled 2015-04-19: qty 10

## 2015-04-19 MED ORDER — BRIMONIDINE TARTRATE 0.2 % OP SOLN
1.0000 [drp] | Freq: Two times a day (BID) | OPHTHALMIC | Status: DC
  Administered 2015-04-19 – 2015-04-21 (×4): 1 [drp] via OPHTHALMIC
  Filled 2015-04-19: qty 5

## 2015-04-19 MED ORDER — ENOXAPARIN SODIUM 40 MG/0.4ML ~~LOC~~ SOLN
40.0000 mg | SUBCUTANEOUS | Status: DC
  Administered 2015-04-19 – 2015-04-20 (×2): 40 mg via SUBCUTANEOUS
  Filled 2015-04-19 (×2): qty 0.4

## 2015-04-19 MED ORDER — BACLOFEN 10 MG PO TABS
10.0000 mg | ORAL_TABLET | Freq: Four times a day (QID) | ORAL | Status: DC
  Administered 2015-04-19 – 2015-04-21 (×8): 10 mg via ORAL
  Filled 2015-04-19 (×8): qty 1

## 2015-04-19 MED ORDER — BRIMONIDINE TARTRATE 0.15 % OP SOLN
1.0000 [drp] | Freq: Two times a day (BID) | OPHTHALMIC | Status: DC
  Filled 2015-04-19: qty 5

## 2015-04-19 NOTE — Consult Note (Signed)
Referring Physician: Dr Darrick Meigs    Chief Complaint: Left leg weakness  HPI: Nathaniel [name] is a 80 y.o. male with history of hypertension, chronic low back pain, and arthritis admitted to [facility] today through the emergency department after he awoke this morning with left leg weakness. A CT scan revealed a small probable old white matter infarct in the left frontal region. Neurology was asked to assist with his evaluation. Last night before going to bed the patient stood up from saying his prayers and suddenly felt dizzy and diaphoretic, with generalized weakness. He denied chest pain or shortness of breath associated with the episode. His wife helped him back to bed. He did not notice any lower extremity weakness until the following morning. The weakness continues along with some mild diffuse numbness and tingling.  Date last known well: Date: 04/18/2015 Time last known well: Unable to determine tPA Given: No: Late presentation and minimal deficits  Past Medical History  Diagnosis Date  . Arthritis   . Environmental allergies     pollen , leaves, grass  . Blood transfusion 2005    s/p hip replacement  . Glaucoma of left eye     legally blind in left eye  . Colon polyps   . Erectile dysfunction     Past Surgical History  Procedure Laterality Date  . Joint replacement    . Multiple tooth extractions  2012    had 23 teeth pulled  . Eye surgery    . Cataract extraction      bilateral  . Glaucoma valve insertion  5573,2202    Left eye  . Hip arthroplasty  2005    Right Hip replacement x 3  . Total knee arthroplasty  12/15/2011    Procedure: TOTAL KNEE ARTHROPLASTY;  Surgeon: Kerin Salen, MD;  Location: [facility];  Service: Orthopedics;  Laterality: Right;  DEPUY SIGMA   . Toenail excision      Family History: No family history of strokes.   Social History:  reports that he quit smoking about 16 years ago. His smoking use included Cigarettes. He has a 10 pack-year smoking  history. He has quit using smokeless tobacco. His smokeless tobacco use included Chew. He reports that he drinks alcohol. He reports that he does not use illicit drugs.  Allergies:  Allergies  Allergen Reactions  . Ibuprofen Hives    Conflict with other meds    Medications:  Scheduled: .  stroke: mapping our early stages of recovery book   Does not apply Once  . baclofen  10 mg Oral QID  . brimonidine  1 drop Both Eyes BID  . dorzolamide-timolol  1 drop Both Eyes BID  . enoxaparin (LOVENOX) injection  40 mg Subcutaneous Q24H  . [START ON 5/42/7062] folic acid  1 mg Oral QHS  . gabapentin  800 mg Oral TID PC & HS  . latanoprost  1 drop Both Eyes QHS  . loratadine  10 mg Oral Daily    ROS: History obtained from the patient  General ROS: negative for - chills, fatigue, fever, night sweats, weight gain or weight loss Psychological ROS: negative for - behavioral disorder, hallucinations, memory difficulties, mood swings or suicidal ideation Ophthalmic ROS: negative for - blurry vision, double vision, eye pain or loss of vision ENT ROS: negative for - epistaxis, nasal discharge, oral lesions, sore throat, tinnitus or vertigo Allergy and Immunology ROS: negative for - hives or itchy/watery eyes Hematological and Lymphatic ROS: negative for -  bleeding problems, bruising or swollen lymph nodes Endocrine ROS: negative for - galactorrhea, hair pattern changes, polydipsia/polyuria or temperature intolerance Respiratory ROS: negative for - cough, hemoptysis, shortness of breath or wheezing. Positive for pneumonia 2 weeks ago - recovered but lost 10 pounds. Cardiovascular ROS: negative for - chest pain, dyspnea on exertion, edema or irregular heartbeat Gastrointestinal ROS: negative for - abdominal pain, diarrhea, hematemesis, nausea/vomiting or stool incontinence. Positive for occasional constipation Genito-Urinary ROS: negative for - dysuria, hematuria, incontinence or urinary  frequency/urgency Musculoskeletal ROS: Positive for diffuse arthritis and chronic low back pain. Neurological ROS: as noted in HPI Dermatological ROS: negative for rash and skin lesion changes   Physical Examination: Blood pressure 157/67, pulse 66, temperature 97.7 F (36.5 C), temperature source Oral, resp. rate 18, height 5\' 6"  (1.676 m), weight 81.647 kg (180 lb), SpO2 97 %.  General - pleasant 80 year old male in no acute distress Eyes: non-icteric, prominent arcus senilus ENT: no op obstruction Heart - Regular rate and rhythm  Lungs - Clear to auscultation Abdomen - Soft - non tender Extremities - Distal pulses intact - no edema Skin - Warm and dry  Mental Status: Alert, oriented, thought content appropriate.  Speech fluent without evidence of aphasia.  Able to follow 3 step commands without difficulty. Cranial Nerves: II: Discs flat bilaterally; Visual fields grossly normal, pupils equal, round, reactive to light and accommodation III,IV, VI: Chronic ptosis left eye. Blind in the left eye, extra-ocular motions intact bilaterally V,VII: smile symmetric, facial light touch sensation normal bilaterally VIII: hearing normal bilaterally IX,X: gag reflex present XI: bilateral shoulder shrug XII: midline tongue extension Motor: Right : Upper extremity   5/5    Left:     Upper extremity   5/5  Lower extremity   5/5     Lower extremity   4/5 Tone and bulk:normal tone throughout; no atrophy noted Sensory:  Mildly decreased sensation to light touch in her aspect of superior right ankle. Deep Tendon Reflexes: 2+ and symmetric throughout Plantars: Right: downgoing   Left: downgoing Cerebellar: normal finger-to-nose, normal rapid alternating movements. Gait: Deferred CV: pulses palpable throughout    Laboratory Studies:  Basic Metabolic Panel:  Recent Labs Lab 04/19/15 1000  NA 140  K 4.1  CL 110  CO2 23  GLUCOSE 111*  BUN 9  CREATININE 0.72  CALCIUM 9.0    Liver  Function Tests:  Recent Labs Lab 04/19/15 1000  AST 16  ALT 10*  ALKPHOS 79  BILITOT 0.5  PROT 6.8  ALBUMIN 3.5   No results for input(s): LIPASE, AMYLASE in the last 168 hours. No results for input(s): AMMONIA in the last 168 hours.  CBC:  Recent Labs Lab 04/19/15 1000  WBC 7.1  NEUTROABS 4.0  HGB 12.8*  HCT 38.8*  MCV 96.8  PLT 273    Cardiac Enzymes:  Recent Labs Lab 04/19/15 1000  TROPONINI <0.03    BNP: Invalid input(s): POCBNP  CBG: No results for input(s): GLUCAP in the last 168 hours.  Microbiology: Results for orders placed or performed during the hospital encounter of 12/08/11  Surgical pcr screen     Status: Abnormal   Collection Time: 12/08/11 11:30 AM  Result Value Ref Range Status   MRSA, PCR NEGATIVE NEGATIVE Final   Staphylococcus aureus POSITIVE (A) NEGATIVE Final    Comment:        The Xpert SA Assay (FDA approved for NASAL specimens only), is one component of a comprehensive surveillance program.  It is  not intended to diagnose infection nor to guide or monitor treatment.    Coagulation Studies:  Recent Labs  04/19/15 1000  LABPROT 14.1  INR 1.07    Urinalysis:  Recent Labs Lab 04/19/15 1010  COLORURINE YELLOW  LABSPEC 1.015  PHURINE 7.0  GLUCOSEU NEGATIVE  HGBUR NEGATIVE  BILIRUBINUR NEGATIVE  KETONESUR NEGATIVE  PROTEINUR NEGATIVE  UROBILINOGEN 0.2  NITRITE NEGATIVE  LEUKOCYTESUR NEGATIVE    Lipid Panel: No results found for: CHOL, TRIG, HDL, CHOLHDL, VLDL, LDLCALC  HgbA1C: No results found for: HGBA1C  Urine Drug Screen:     Component Value Date/Time   LABOPIA POSITIVE* 04/19/2015 1010   COCAINSCRNUR NONE DETECTED 04/19/2015 1010   LABBENZ NONE DETECTED 04/19/2015 1010   AMPHETMU NONE DETECTED 04/19/2015 1010   THCU NONE DETECTED 04/19/2015 1010   LABBARB NONE DETECTED 04/19/2015 1010    Alcohol Level:  Recent Labs Lab 04/19/15 1000  ETH <5    Other results: EKG: Sinus rhythm rate 70 bpm  with occasional premature supraventricular complexes.  Imaging:   Dg Chest 2 View 04/19/2015    No acute cardiopulmonary disease.  Peribronchial thickening which may relate to chronic bronchitis or smoking.      Ct Head Wo Contrast 04/19/2015    Small probable old white matter infarct LEFT frontal region.  Otherwise negative exam.     I have seen and evaluated the patient. I have reviewed the above note and made appropriate changes.    Assessment: 80 y.o. male with left lower extremity weakness upon awakening this morning. The patient feels it affects him the most when he attempts to stand. I do suspect a small stroke, but if MRI brain is negative, may need to consider l-spine imaging.    Stroke Risk Factors - hypertension and old stroke  Plan: 1. HgbA1c, fasting lipid panel 2. MRI, MRA  of the brain without contrast  3. PT consult, OT consult, Speech consult 4. Echocardiogram 5. Carotid dopplers 6. Prophylactic therapy- ibuprofen and possible aspirin allergies. Would treat with Plavix 75 mg daily. 8. Telemetry monitoring 9. Every 4 hours neuro checks with vitals.   Nathaniel Rack, MD [facility] [phone]  If 7pm- 7am, please page neurology on call as listed in [place].

## 2015-04-19 NOTE — ED Notes (Signed)
Pt states he is unable to void at present time 

## 2015-04-19 NOTE — ED Notes (Signed)
[facility] is transporting patient to [facility] --room 4N17

## 2015-04-19 NOTE — ED Notes (Signed)
Report called to [facility].

## 2015-04-19 NOTE — Progress Notes (Signed)
Pt arrived to 4N17. Alert and oriented x4. Pt placed on tele. Bed alarm on and call bell within reach. Will continue to monitor.

## 2015-04-19 NOTE — ED Notes (Signed)
Urinal provided.

## 2015-04-19 NOTE — ED Notes (Signed)
[facility] contacted the Neurologist and the hospitalist for Dr. Johny Blamer

## 2015-04-19 NOTE — ED Notes (Signed)
MD at bedside. Pt and spouse informed of plan of care and admission.  Unable to void.  PO fluids provided.

## 2015-04-19 NOTE — H&P (Signed)
PCP:   Shirline Frees, MD   Chief Complaint:  Left leg weakness  HPI: 80 year old male who   has a past medical history of Arthritis; Environmental allergies; Blood transfusion (2005); Glaucoma of left eye; Colon polyps; and Erectile dysfunction. today presented to the ED after developing left leg weakness since this morning. Patient says that last night while he was kneeling down for his prayers he felt dizzy and was lightheaded after that became sweaty and had generalized weakness. Patient denies any chest pain or shortness of breath did not pass out. No history of seizures. Patient says that he went to bed last night and woke up this morning to find that his left leg was weak and was unable to be a weight on it. Denies any history of slurred speech, he is blind in left eye, denies any blurred vision from right eye. Denies any headache, no nausea, vomiting or diarrhea. No fever dysuria urgency or frequency of urination.  In the ED CT head was negative. Patient transferred to [facility] for further evaluation.  Allergies:   Allergies  Allergen Reactions  . Ibuprofen Hives    Conflict with other meds      Past Medical History  Diagnosis Date  . Arthritis   . Environmental allergies     pollen , leaves, grass  . Blood transfusion 2005    s/p hip replacement  . Glaucoma of left eye     legally blind in left eye  . Colon polyps   . Erectile dysfunction     Past Surgical History  Procedure Laterality Date  . Joint replacement    . Multiple tooth extractions  2012    had 23 teeth pulled  . Eye surgery    . Cataract extraction      bilateral  . Glaucoma valve insertion  8119,[id number]    Left eye  . Hip arthroplasty  2005    Right Hip replacement x 3  . Total knee arthroplasty  12/15/2011    Procedure: TOTAL KNEE ARTHROPLASTY;  Surgeon: Kerin Salen, MD;  Location: [facility];  Service: Orthopedics;  Laterality: Right;  DEPUY SIGMA   . Toenail excision      Prior to  Admission medications   Medication Sig Start Date End Date Taking? Authorizing Provider  baclofen (LIORESAL) 10 MG tablet Take 10 mg by mouth 4 (four) times daily.   Yes Historical Provider, MD  brimonidine (ALPHAGAN) 0.15 % ophthalmic solution Place 1 drop into both eyes 2 (two) times daily.    Yes Historical Provider, MD  cetirizine (ZYRTEC) 10 MG tablet Take 10 mg by mouth daily.   Yes Historical Provider, MD  dorzolamide-timolol (COSOPT) 22.3-6.8 MG/ML ophthalmic solution Place 1 drop into both eyes 2 (two) times daily.   Yes Historical Provider, MD  folic acid (FOLVITE) 1 MG tablet Take 1 mg by mouth at bedtime.    Yes Historical Provider, MD  gabapentin (NEURONTIN) 800 MG tablet Take 800 mg by mouth 4 (four) times daily.   Yes Historical Provider, MD  HYDROcodone-acetaminophen (NORCO) 10-325 MG per tablet Take 1 tablet by mouth every 4 (four) hours as needed. 03/09/15  Yes Historical Provider, MD  lisinopril (PRINIVIL,ZESTRIL) 20 MG tablet Take 20 mg by mouth at bedtime.    Yes Historical Provider, MD  methotrexate (RHEUMATREX) 2.5 MG tablet Take 15 mg by mouth once a week.    Yes Historical Provider, MD  Travoprost, BAK Free, (TRAVATAN) 0.004 % SOLN ophthalmic solution Place  1 drop into both eyes at bedtime.   Yes Historical Provider, MD  Vitamin Mixture (VITAMIN E COMPLETE) CAPS Take 1 capsule by mouth daily as needed (for supplementation).   Yes Historical Provider, MD  warfarin (COUMADIN) 5 MG tablet Take 1 tablet (5 mg total) by mouth one time only at 6 PM. 12/19/11 12/18/12  Leafy Kindle, PA-C    Social History:  reports that he quit smoking about 16 years ago. His smoking use included Cigarettes. He has a 10 pack-year smoking history. He has quit using smokeless tobacco. His smokeless tobacco use included Chew. He reports that he drinks alcohol. He reports that he does not use illicit drugs.  Family history- no family history of heart disease or stroke, patient's parents  died of old  age.    Filed Weights   04/19/15 0955  Weight: 81.647 kg (180 lb)    All the positives are listed in BOLD  Review of Systems:  HEENT: Headache, blurred vision, runny nose, sore throat, history of glaucoma, blind in left eye  Neck: Hypothyroidism, hyperthyroidism,,lymphadenopathy Chest : Shortness of breath, history of COPD, Asthma Heart : Chest pain, history of coronary arterey disease GI:  Nausea, vomiting, diarrhea, constipation, GERD GU: Dysuria, urgency, frequency of urination, hematuria Neuro: Stroke, seizures, syncope Psych: Depression, anxiety, hallucinations   Physical Exam: Blood pressure 157/67, pulse 66, temperature 97.7 F (36.5 C), temperature source Oral, resp. rate 18, height 5\' 6"  (1.676 m), weight 81.647 kg (180 lb), SpO2 97 %. Constitutional:   Patient is a well-developed and well-nourished male* in no acute distress and cooperative with exam. Head: Normocephalic and atraumatic Mouth: Mucus membranes moist Eyes: PERRL, EOMI, conjunctivae normal Neck: Supple, No Thyromegaly Cardiovascular: RRR, S1 normal, S2 normal Pulmonary/Chest: CTAB, no wheezes, rales, or rhonchi Abdominal: Soft. Non-tender, non-distended, bowel sounds are normal, no masses, organomegaly, or guarding present.  Mental Status:  Alert. Speech normal, no dysarthria. Able to follow 3 step commands. Oriented x 3 Cranial Nerves:  II:  Visual fields grossly intact in right eye  V/VII:  No  facial droop. facial light touch sensation normal bilaterally.  VIII:  Grossly intact.  IX/X:  Grossly normal gag.  XI:  Grossly normal  XII:  Midline tongue extension normal.  Motor:  RUE  5/5 hand grip  LUE  Hand grip 5/5. 5/5 RLE and 4-/5 LLE  Sensory:  Light touch intact throughout, bilaterally  DTRs:  3+ in the upper extremities, 3+ in lower extremities Plantars:  downgoing on the right and left  Extremities : No Cyanosis, Clubbing or Edema, positive varicose veins bilaterally   Labs on  Admission:  Basic Metabolic Panel:  Recent Labs Lab 04/19/15 1000  NA 140  K 4.1  CL 110  CO2 23  GLUCOSE 111*  BUN 9  CREATININE 0.72  CALCIUM 9.0   Liver Function Tests:  Recent Labs Lab 04/19/15 1000  AST 16  ALT 10*  ALKPHOS 79  BILITOT 0.5  PROT 6.8  ALBUMIN 3.5   No results for input(s): LIPASE, AMYLASE in the last 168 hours. No results for input(s): AMMONIA in the last 168 hours. CBC:  Recent Labs Lab 04/19/15 1000  WBC 7.1  NEUTROABS 4.0  HGB 12.8*  HCT 38.8*  MCV 96.8  PLT 273   Cardiac Enzymes:  Recent Labs Lab 04/19/15 1000  TROPONINI <0.03     Radiological Exams on Admission: Dg Chest 2 View  04/19/2015   CLINICAL DATA:  Generalized weakness. Diaphoresis. Left-sided numbness. Dizziness.  EXAM: CHEST  2 VIEW  COMPARISON:  03/30/2015  FINDINGS: Numerous leads and wires project over the chest. Right glenohumeral joint osteoarthritis with high-riding right humeral head, suggesting chronic rotator cuff insufficiency. Midline trachea. Normal heart size. Tortuous thoracic aorta. No pleural effusion or pneumothorax. mild upper lung volume loss with right and possible left hilar retraction superiorly. Unchanged. Clearing of right lung base opacity. Mild left base scarring. Mild lower lobe predominant interstitial thickening.  IMPRESSION: No acute cardiopulmonary disease.  Peribronchial thickening which may relate to chronic bronchitis or smoking.   Electronically Signed   By: Abigail Miyamoto M.D.   On: 04/19/2015 10:49   Ct Head Wo Contrast  04/19/2015   CLINICAL DATA:  LEFT side weakness, near syncope, unable to bear weight on LEFT side today, leading to LEFT, former smoker  EXAM: CT HEAD WITHOUT CONTRAST  TECHNIQUE: Contiguous axial images were obtained from the base of the skull through the vertex without intravenous contrast.  COMPARISON:  None  FINDINGS: Generalized atrophy.  Normal ventricular morphology.  No midline shift or mass effect.  Small old  appearing white matter infarct in LEFT frontal region.  No intracranial hemorrhage, mass lesion or evidence acute infarction.  No extra-axial fluid collections.  Bones and sinuses unremarkable.  LEFT superior eyelid weight.  IMPRESSION: Small probable old white matter infarct LEFT frontal region.  Otherwise negative exam.   Electronically Signed   By: Lavonia Dana M.D.   On: 04/19/2015 10:34    EKG: Independently reviewed. Sinus rhythm   Assessment/Plan Active Problems:   Osteoarthritis of right knee   Stroke   Leg weakness  Left leg weakness Patient has mild left leg weakness, CT head is negative. Will admit the patient for stroke workup and obtain carotid Dopplers, MRI brain, MRI  Brain, 2-D echocardiogram, lipid profile, hemoglobin A1c. Neurology has been consulted. Patient has allergy to aspirin, will let neurology decide the appropriate antiplatelet therapy  Hypertension Hold lisinopril at this time due to possibility of stroke.  DVT prophylaxis Lovenox   Code status: full code   Family discussion: Admission, patients condition and plan of care including tests being ordered have been discussed with the patient and his wife  at bedside* who indicate understanding and agree with the plan and Code Status.   Time Spent on Admission: 65 min  [facility] Pager: [phone] 04/19/2015, 3:29 PM  If 7PM-7AM, please contact night-coverage  [email or web address]  Password [email or web address]

## 2015-04-19 NOTE — ED Provider Notes (Addendum)
CSN: [id number]     Arrival date & time 04/19/15  6073 History   First MD Initiated Contact with Patient 04/19/15 0957     Chief Complaint  Patient presents with  . Weakness     (Consider location/radiation/quality/duration/timing/severity/associated sxs/prior Treatment) HPI Comments: Patient states last night as he was kneeling does say his prayers he started to feel very dizzy which was a lightheaded sensation, sweaty and generally weak. He denied any chest pain or shortness of breath at that time family helped him into his bed and provided cold franks until the sweating stopped. Patient went to bed however this morning when he woke up he noted weakness to his left lower leg as well as feeling numb. He was unable to ambulate because his leg keeps giving out. He also complains of feeling slightly lightheaded. He denies any vertiginous symptoms. Also upon arrival here family noted a slight right-sided facial droop. Patient is currently not endorsing headache or pain anywhere. No nausea or vomiting. No recent medication changes. However 3 weeks ago he was admitted and treated for pneumonia.  Patient is a 80 y.o. male presenting with weakness. The history is provided by the patient and a relative.  Weakness This is a new problem. The current episode started 12 to 24 hours ago. The problem occurs constantly. The problem has not changed since onset.Pertinent negatives include no chest pain, no abdominal pain, no headaches and no shortness of breath.    Past Medical History  Diagnosis Date  . Arthritis   . Environmental allergies     pollen , leaves, grass  . Blood transfusion 2005    s/p hip replacement  . Glaucoma of left eye     legally blind in left eye  . Colon polyps   . Erectile dysfunction    Past Surgical History  Procedure Laterality Date  . Joint replacement    . Multiple tooth extractions  2012    had 23 teeth pulled  . Eye surgery    . Cataract extraction      bilateral   . Glaucoma valve insertion  [id number]    Left eye  . Hip arthroplasty  2005    Right Hip replacement x 3  . Total knee arthroplasty  12/15/2011    Procedure: TOTAL KNEE ARTHROPLASTY;  Surgeon: Kerin Salen, MD;  Location: [facility];  Service: Orthopedics;  Laterality: Right;  DEPUY SIGMA   . Toenail excision     No family history on file. History  Substance Use Topics  . Smoking status: Former Smoker -- 0.25 packs/day for 40 years    Types: Cigarettes    Quit date: 12/07/1998  . Smokeless tobacco: Former Systems developer    Types: Chew  . Alcohol Use: Yes     Comment: rare    Review of Systems  Respiratory: Negative for shortness of breath.   Cardiovascular: Negative for chest pain.  Gastrointestinal: Negative for abdominal pain.  Neurological: Positive for weakness. Negative for headaches.  All other systems reviewed and are negative.     Allergies  Ibuprofen  Home Medications   Prior to Admission medications   Medication Sig Start Date End Date Taking? Authorizing Provider  baclofen (LIORESAL) 10 MG tablet Take 10 mg by mouth 4 (four) times daily.   Yes Historical Provider, MD  benzonatate (TESSALON) 100 MG capsule Take by mouth 3 (three) times daily as needed for cough.   Yes Historical Provider, MD  brimonidine (ALPHAGAN) 0.15 % ophthalmic solution Place 1  drop into both eyes daily before breakfast.   Yes Historical Provider, MD  cetirizine (ZYRTEC) 10 MG tablet Take 10 mg by mouth daily.   Yes Historical Provider, MD  dorzolamide-timolol (COSOPT) 22.3-6.8 MG/ML ophthalmic solution Place 1 drop into both eyes 2 (two) times daily.   Yes Historical Provider, MD  folic acid (FOLVITE) 1 MG tablet Take 1 mg by mouth daily.   Yes Historical Provider, MD  gabapentin (NEURONTIN) 800 MG tablet Take 800 mg by mouth 4 (four) times daily.   Yes Historical Provider, MD  lisinopril (PRINIVIL,ZESTRIL) 20 MG tablet Take 20 mg by mouth daily.   Yes Historical Provider, MD  methotrexate (RHEUMATREX)  2.5 MG tablet Take 7.5 mg by mouth 3 (three) times a week.   Yes Historical Provider, MD  Travoprost, BAK Free, (TRAVATAN) 0.004 % SOLN ophthalmic solution Place 1 drop into both eyes at bedtime.   Yes Historical Provider, MD  chlorzoxazone (PARAFON) 500 MG tablet Take 500 mg by mouth 3 (three) times daily as needed.    Historical Provider, MD  warfarin (COUMADIN) 5 MG tablet Take 1 tablet (5 mg total) by mouth one time only at 6 PM. 12/19/11 12/18/12  Leafy Kindle, PA-C   BP 169/85 mmHg  Pulse 66  Temp(Src) 98 F (36.7 C) (Oral)  Resp 16  Ht 5\' 6"  (1.676 m)  Wt 180 lb (81.647 kg)  BMI 29.07 kg/m2  SpO2 99% Physical Exam  Constitutional: He is oriented to person, place, and time. He appears well-developed and well-nourished. No distress.  HENT:  Head: Normocephalic and atraumatic.  Mouth/Throat: Oropharynx is clear and moist.  Eyes: Conjunctivae and EOM are normal. Pupils are equal, round, and reactive to light.  Neck: Normal range of motion. Neck supple.  Cardiovascular: Normal rate, regular rhythm and intact distal pulses.   Occasional extrasystoles are present.  No murmur heard. Pulmonary/Chest: Effort normal and breath sounds normal. No respiratory distress. He has no wheezes. He has no rales.  Abdominal: Soft. He exhibits no distension. There is no tenderness. There is no rebound and no guarding.  Musculoskeletal: Normal range of motion. He exhibits no edema or tenderness.  Varicose veins present in bilateral lower extremities. Nontender to the touch. Feet are warm bilaterally with 2+ DP pulse  Neurological: He is alert and oriented to person, place, and time.  4 out of 5 strength in the left lower extremity. Pronator drift the left lower extremity. Right lower extremity with 5 out of 5 strength and no pronator drift. Slight decreased sensation in the left lower extremity. Mild right-sided facial droop with intact sensation. 5 out of 5 strength in the bilateral upper extremities  with normal sensation.  Skin: Skin is warm and dry. No rash noted. No erythema.  Psychiatric: He has a normal mood and affect. His behavior is normal.  Nursing note and vitals reviewed.   ED Course  Procedures (including critical care time) Labs Review Labs Reviewed  CBC - Abnormal; Notable for the following:    RBC 4.01 (*)    Hemoglobin 12.8 (*)    HCT 38.8 (*)    All other components within normal limits  DIFFERENTIAL - Abnormal; Notable for the following:    Monocytes Relative 13 (*)    Eosinophils Relative 11 (*)    Eosinophils Absolute 0.8 (*)    All other components within normal limits  COMPREHENSIVE METABOLIC PANEL - Abnormal; Notable for the following:    Glucose, Bld 111 (*)    ALT 10 (*)  All other components within normal limits  ETHANOL  PROTIME-INR  APTT  TROPONIN I  URINE RAPID DRUG SCREEN, HOSP PERFORMED  URINALYSIS, ROUTINE W REFLEX MICROSCOPIC (NOT AT [facility])  I-STAT CHEM 8, ED    Imaging Review Dg Chest 2 View  04/19/2015   CLINICAL DATA:  Generalized weakness. Diaphoresis. Left-sided numbness. Dizziness.  EXAM: CHEST  2 VIEW  COMPARISON:  03/30/2015  FINDINGS: Numerous leads and wires project over the chest. Right glenohumeral joint osteoarthritis with high-riding right humeral head, suggesting chronic rotator cuff insufficiency. Midline trachea. Normal heart size. Tortuous thoracic aorta. No pleural effusion or pneumothorax. mild upper lung volume loss with right and possible left hilar retraction superiorly. Unchanged. Clearing of right lung base opacity. Mild left base scarring. Mild lower lobe predominant interstitial thickening.  IMPRESSION: No acute cardiopulmonary disease.  Peribronchial thickening which may relate to chronic bronchitis or smoking.   Electronically Signed   By: Abigail Miyamoto M.D.   On: 04/19/2015 10:49   Ct Head Wo Contrast  04/19/2015   CLINICAL DATA:  LEFT side weakness, near syncope, unable to bear weight on LEFT side today, leading  to LEFT, former smoker  EXAM: CT HEAD WITHOUT CONTRAST  TECHNIQUE: Contiguous axial images were obtained from the base of the skull through the vertex without intravenous contrast.  COMPARISON:  None  FINDINGS: Generalized atrophy.  Normal ventricular morphology.  No midline shift or mass effect.  Small old appearing white matter infarct in LEFT frontal region.  No intracranial hemorrhage, mass lesion or evidence acute infarction.  No extra-axial fluid collections.  Bones and sinuses unremarkable.  LEFT superior eyelid weight.  IMPRESSION: Small probable old white matter infarct LEFT frontal region.  Otherwise negative exam.   Electronically Signed   By: Lavonia Dana M.D.   On: 04/19/2015 10:34     EKG Interpretation   Date/Time:  Thursday April 19 2015 09:54:29 EDT Ventricular Rate:  70 PR Interval:  178 QRS Duration: 96 QT Interval:  428 QTC Calculation: 462 R Axis:   47 Text Interpretation:  Sinus rhythm with Premature supraventricular  complexes and with occasional Premature ventricular complexes Otherwise  normal ECG No significant change since last tracing Confirmed by Maryan Rued   MD, Loree Fee ([id number]) on 04/19/2015 10:15:04 AM      MDM   Final diagnoses:  Dizziness  Stroke    Patient presents with a history of near syncope last night that caused him to become sweaty and weak. Family helped him to bed and this morning at 8:30 when he got up he was unable to put weight on his left leg. He complains of his legs feeling numb and being weak. He denies any pain. Also upon arrival here family noted some drooping right side of his face. Denies any headache, chest pain or shortness of breath. He still complains of feeling slightly dizzy.  Patient has no prior history of stroke or symptoms similar to this. He denies any speech difficulty, swallowing difficulty, change in his vision.  On exam patient has significant weakness in the left leg compared to the right. With positive pronator drift and 4  out of 5 strength. No visual field cuts in the right eye. He has ongoing problems with his left eye after recurrent surgeries. Mild right-sided facial droop. Slight decreased sensation in the left lower extremity. Unaffected upper extremities.    Patient denies any recent medication changes. He does not take Coumadin now and denies any recent head injury.  EKG shows occasional PVCs  but otherwise normal. No prior heart history.  Concern for acute stroke versus intracranial hemorrhage however does not fit into the window for TPA as patient's last seen normal time was 8:30 last night.  Lower suspicion for cardiac disease as the cause as pt has no prior hx and no CP or SOB.  Also lower suspicion of vascular cause as pt's foot is warm and 2+ distal pulses.  Stroke order set initiated  11:33 AM All labs without acute findings. Head CT without signs of acute stroke. Given patient's persistent symptoms will admit for stroke workup. Spoke with neurology who will consult on patient  Blanchie Dessert, MD 04/19/15 [place], MD 04/19/15 [phone]

## 2015-04-19 NOTE — ED Notes (Signed)
[facility] at bedside preparing to transport.  Pt stable upon transport

## 2015-04-19 NOTE — Significant Event (Signed)
  Call from Dr. Maryan Rued  80 y/o Htn, BPH ~ 2030 on 6/15 HA, sweaty and was weak on awaklening this am 4/5 strg LLE, Pronator drift Slight facial droop no speech issues Head Ct PVC's on EKG but rest  BP 142/78 mmHg  Pulse 58  Temp(Src) 98 F (36.7 C) (Oral)  Resp 16  Ht 5\' 6"  (1.676 m)  Wt 81.647 kg (180 lb)  BMI 29.07 kg/m2  SpO2 99%  Dr. Leonel Ramsay will consult when called   Verneita Griffes, MD [facility] (P) [phone]

## 2015-04-19 NOTE — ED Notes (Signed)
Report called to Manus Gunning, RN Unit Nurse

## 2015-04-19 NOTE — ED Notes (Signed)
States he kneeled to say prayers, had a near syncopal episode, generalized weakness and diaphoretic.  States he went to bed and this am has had difficulty ambulating and left  leg feels numb.  Family reports he leans while attempting to ambulate. Also reports dizziness and like "I might pass out".

## 2015-04-19 NOTE — ED Notes (Signed)
Patient transported to CT 

## 2015-04-20 ENCOUNTER — Inpatient Hospital Stay (HOSPITAL_BASED_OUTPATIENT_CLINIC_OR_DEPARTMENT_OTHER): Payer: Medicare Other

## 2015-04-20 ENCOUNTER — Inpatient Hospital Stay (HOSPITAL_COMMUNITY): Payer: Medicare Other

## 2015-04-20 ENCOUNTER — Observation Stay (HOSPITAL_COMMUNITY): Payer: Medicare Other

## 2015-04-20 DIAGNOSIS — I639 Cerebral infarction, unspecified: Secondary | ICD-10-CM | POA: Diagnosis not present

## 2015-04-20 DIAGNOSIS — M179 Osteoarthritis of knee, unspecified: Secondary | ICD-10-CM

## 2015-04-20 DIAGNOSIS — R29898 Other symptoms and signs involving the musculoskeletal system: Secondary | ICD-10-CM

## 2015-04-20 DIAGNOSIS — R42 Dizziness and giddiness: Secondary | ICD-10-CM

## 2015-04-20 LAB — LIPID PANEL
Cholesterol: 160 mg/dL (ref 0–200)
HDL: 41 mg/dL (ref >40)
LDL Cholesterol: 103 mg/dL — ABNORMAL HIGH (ref 0–99)
Total CHOL/HDL Ratio: 3.9 RATIO
Triglycerides: 78 mg/dL (ref <150)
VLDL: 16 mg/dL (ref 0–40)

## 2015-04-20 MED ORDER — ENSURE ENLIVE PO LIQD: 237.0000 mL | Freq: Two times a day (BID) | ORAL | Status: DC | PRN

## 2015-04-20 MED ORDER — PERFLUTREN LIPID MICROSPHERE
1.0000 mL | INTRAVENOUS | Status: AC | PRN
  Administered 2015-04-20: 2 mL via INTRAVENOUS
  Filled 2015-04-20: qty 10

## 2015-04-20 NOTE — Care Management Note (Signed)
Case Management Note  Patient Details  Name: LAURANCE HEIDE MRN: [id number] Date of Birth: 05/06/1935  Subjective/Objective:                    Action/Plan:  Met with patient to discuss discharge needs. Patient is agreeable to outpatient PT/OT and has chosen [facility], where he has been in the past.  Orders and clinicals were faxed and a voicemail was left to notify that referral was being sent.   Expected Discharge Date:                  Expected Discharge Plan:  Home/Self Care  In-House Referral:     Discharge planning Services  CM Consult  Post Acute Care Choice:    Choice offered to:  Patient  DME Arranged:    DME Agency:     HH Arranged:    [place] Agency:     Status of Service:  Completed, signed off  Medicare Important Message Given:    Date Medicare IM Given:    Medicare IM give by:    Date Additional Medicare IM Given:    Additional Medicare Important Message give by:     If discussed at [place] of Stay Meetings, dates discussed:    Additional Comments:  Rolm Baptise, RN 04/20/2015, 3:00 PM

## 2015-04-20 NOTE — Progress Notes (Signed)
Echocardiogram 2D Echocardiogram has been performed.  Nathaniel [name], Nathaniel [name] 04/20/2015, 12:11 PM

## 2015-04-20 NOTE — Progress Notes (Signed)
Initial Nutrition Assessment  DOCUMENTATION CODES:  Not applicable  INTERVENTION: Monitor PO intake for adequacy Ensure Enlive (each supplement provides 350kcal and 20 grams of protein) PRN  NUTRITION DIAGNOSIS:  Predicted suboptimal nutrient intake related to acute illness as evidenced by per patient/family report.  GOAL:  Patient will meet greater than or equal to 90% of their needs  MONITOR:  PO intake, Supplement acceptance, Labs, Weight trends, Skin, I & O's  REASON FOR ASSESSMENT:  Malnutrition Screening Tool    ASSESSMENT: 80 year old male who  has a past medical history of Arthritis; Environmental allergies; Blood transfusion (2005); Glaucoma of left eye; Colon polyps; and Erectile dysfunction. today presented to the ED after developing left leg weakness since this morning.  Bedside echo being performed at time of visit. Per MST filed in pt chart, pt reported eating poorly due to a decreased appetite and recent unintentional weight loss.   Labs reviewed.   Height:  Ht Readings from Last 1 Encounters:  04/19/15 5\' 6"  (1.676 m)    Weight:  Wt Readings from Last 1 Encounters:  04/19/15 180 lb (81.647 kg)    Ideal Body Weight:  64.5 kg  Wt Readings from Last 10 Encounters:  04/19/15 180 lb (81.647 kg)  02/03/13 171 lb (77.565 kg)  12/15/11 174 lb 9.7 oz (79.2 kg)  12/08/11 174 lb 9.7 oz (79.2 kg)    BMI:  Body mass index is 29.07 kg/(m^2).  Estimated Nutritional Needs:  Kcal:  1700-1900  Protein:  80-90 grams  Fluid:  1.9 L/day  Skin:  Reviewed, no issues  Diet Order:  Diet Heart Room service appropriate?: Yes; Fluid consistency:: Thin  EDUCATION NEEDS:  No education needs identified at this time   Intake/Output Summary (Last 24 hours) at 04/20/15 1153 Last data filed at 04/20/15 0942  Gross per 24 hour  Intake      0 ml  Output   1250 ml  Net  -1250 ml    Last BM:  6/15  Pryor Ochoa RD, LDN Inpatient Clinical  Dietitian Pager: [phone] After Hours Pager: [phone]

## 2015-04-20 NOTE — Progress Notes (Signed)
VASCULAR LAB PRELIMINARY  PRELIMINARY  PRELIMINARY  PRELIMINARY  Carotid duplex completed.    Preliminary report:  Bilateral:  1-39% ICA stenosis.  Vertebral artery flow is antegrade.     Shatasha Lambing, RVS 04/20/2015, 1:50 PM

## 2015-04-20 NOTE — Progress Notes (Signed)
Subjective: No family members present. The patient voices no new complaints. He has not yet been out of bed. Discussed the results of his MRI and the fact that it does not show a new stroke. Consider MRI of L-spine. The patient does have a history of low back pain and sees a neurosurgeon in [facility]. Will see how the patient does with physical therapy today.  Objective: Current vital signs: BP 161/85 mmHg  Pulse 68  Temp(Src) 98.4 F (36.9 C) (Oral)  Resp 20  Ht 5\' 6"  (1.676 m)  Wt 81.647 kg (180 lb)  BMI 29.07 kg/m2  SpO2 97% Vital signs in last 24 hours: Temp:  [97.7 F (36.5 C)-98.4 F (36.9 C)] 98.4 F (36.9 C) (06/17 0533) Pulse Rate:  [57-74] 68 (06/17 0533) Resp:  [16-20] 20 (06/17 0533) BP: (121-170)/(64-91) 161/85 mmHg (06/17 0533) SpO2:  [96 %-100 %] 97 % (06/17 0533) Weight:  [81.647 kg (180 lb)] 81.647 kg (180 lb) (06/16 0955)  Intake/Output from previous day: 06/16 0701 - 06/17 0700 In: -  Out: 1000 [Urine:1000] Intake/Output this shift:   Nutritional status: Diet Heart Room service appropriate?: Yes; Fluid consistency:: Thin  Physical Exam  Neurologic Exam:  MENTAL STATUS: awake, alert, Language fluent Follows simple commands. Naming intact   CRANIAL NERVES: pupils equal and reactive to light, visual fields full to confrontation, extraocular muscles intact, no nystagmus, facial sensation and strength symmetric, uvula midline, shoulder shrug symmetric, tongue midline, Corneal reflex,  MOTOR: normal bulk and tone, full strength in the BUE, BLE  SENSORY: Decreased sensation to light touch inner aspect of left superior ankle area. COORDINATION: finger-nose-finger normal  GAIT/STATION: Deferred   Lab Results: Basic Metabolic Panel:  Recent Labs Lab 04/19/15 1000 04/19/15 1643  NA 140  --   K 4.1  --   CL 110  --   CO2 23  --   GLUCOSE 111*  --   BUN 9  --   CREATININE 0.72 0.80  CALCIUM 9.0  --     Liver Function Tests:  Recent Labs Lab  04/19/15 1000  AST 16  ALT 10*  ALKPHOS 79  BILITOT 0.5  PROT 6.8  ALBUMIN 3.5   No results for input(s): LIPASE, AMYLASE in the last 168 hours. No results for input(s): AMMONIA in the last 168 hours.  CBC:  Recent Labs Lab 04/19/15 1000 04/19/15 1643  WBC 7.1 7.3  NEUTROABS 4.0  --   HGB 12.8* 13.2  HCT 38.8* 41.9  MCV 96.8 98.4  PLT 273 266    Cardiac Enzymes:  Recent Labs Lab 04/19/15 1000  TROPONINI <0.03    Lipid Panel: No results for input(s): CHOL, TRIG, HDL, CHOLHDL, VLDL, LDLCALC in the last 168 hours.  CBG: No results for input(s): GLUCAP in the last 168 hours.  Microbiology: Results for orders placed or performed during the hospital encounter of 12/08/11  Surgical pcr screen     Status: Abnormal   Collection Time: 12/08/11 11:30 AM  Result Value Ref Range Status   MRSA, PCR NEGATIVE NEGATIVE Final   Staphylococcus aureus POSITIVE (A) NEGATIVE Final    Comment:        The Xpert SA Assay (FDA approved for NASAL specimens only), is one component of a comprehensive surveillance program.  It is not intended to diagnose infection nor to guide or monitor treatment.    Coagulation Studies:  Recent Labs  04/19/15 1000  LABPROT 14.1  INR 1.07    Imaging:  Dg Chest  2 View 04/19/2015    No acute cardiopulmonary disease.  Peribronchial thickening which may relate to chronic bronchitis or smoking.      Ct Head Wo Contrast 04/19/2015    Small probable old white matter infarct LEFT frontal region.  Otherwise negative exam.       Mr Brain Wo Contrast 04/20/2015    MRI HEAD IMPRESSION:   1. No acute intracranial infarct or other abnormality identified.  2. 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   MRA HEAD IMPRESSION:   Normal MRA of the intracranial circulation.        Medications:  Scheduled: . baclofen  10 mg Oral QID  . brimonidine  1 drop Both Eyes BID  . clopidogrel  75 mg Oral Daily  .  dorzolamide-timolol  1 drop Both Eyes BID  . enoxaparin (LOVENOX) injection  40 mg Subcutaneous Q24H  . folic acid  1 mg Oral QHS  . gabapentin  800 mg Oral TID PC & HS  . latanoprost  1 drop Both Eyes QHS  . loratadine  10 mg Oral Daily    Assessment/Plan: Consider lumbar spine MRI. See how patient does with physical therapy today. Dr. Leonel Ramsay to see patient.   Mikey Bussing PA-C [facility] Pager [phone] 04/20/2015, 8:02 AM  80 yo M with mild leg weakness. I would favor MRI l-spine, if negative then would continue working with PT, but no further evaluation.   Roland Rack, MD [facility] [phone]  If 7pm- 7am, please page neurology on call as listed in [place].

## 2015-04-20 NOTE — Evaluation (Signed)
Occupational Therapy Evaluation Patient Details Name: Nathaniel [name] MRN: [id number] DOB: 03/25/1935 Today's Date: 04/20/2015    History of Present Illness 80 y.o. male with onset of L leg weakness and mild diffuse numbness and weakness. PMH: HTN, chronic low back pain, arthritis, L eye glaucoma/ blindnes   Clinical Impression   Pt eager to participate in therapy and complete grooming/ bathing tasks. Pt requires A for balance when turning and during functional tasks. Pt reports blindness in L eye to be baseline; Pt is able to scan environment to find objects on L side, but requires cuing for RW placement and navigating around objects on L side. Pt requesting to sit at sink while bathing due to balance deficits; and braces on bil UE when standing at sink. Pt would benefit from skilled OT for safety and independence with ADL completion in preparation for d/c home.     Follow Up Recommendations  Outpatient OT    Equipment Recommendations       Recommendations for Other Services       Precautions / Restrictions Precautions Precautions: Fall Restrictions Weight Bearing Restrictions: No      Mobility Bed Mobility Overal bed mobility: Modified Independent                Transfers Overall transfer level: Needs assistance Equipment used: Rolling walker (2 wheeled) Transfers: Sit to/from Stand Sit to Stand: Min guard         General transfer comment: pt able to come to stand with MinG when he was able to transition his hands to RW.  Attempted coming to stand without RW and pt needed MinA to complete coming to standing.      Balance Overall balance assessment: Needs assistance Sitting-balance support: No upper extremity supported;Feet supported Sitting balance-Leahy Scale: Fair     Standing balance support: During functional activity;Bilateral upper extremity supported Standing balance-Leahy Scale: Poor                              ADL Overall ADL's :  Needs assistance/impaired Eating/Feeding: Independent;Sitting   Grooming: Wash/dry hands;Wash/dry face;Oral care;Applying deodorant;Supervision/safety;Sitting   Upper Body Bathing: Supervision/ safety;Sitting   Lower Body Bathing: Supervison/ safety;Sit to/from stand   Upper Body Dressing : Supervision/safety;Sitting   Lower Body Dressing: Supervision/safety;Sit to/from stand   Toilet Transfer: Supervision/safety;Ambulation;RW;BSC   Toileting- Water quality scientist and Hygiene: Independent;Sit to/from stand       Functional mobility during ADLs: Rolling walker;Min guard General ADL Comments: Pt able to scan environment to find objects on L side while grooming     Vision Vision Assessment?: No apparent visual deficits (L eye blindness at baseline)   Perception     Praxis      Pertinent Vitals/Pain Pain Assessment: No/denies pain     Hand Dominance Right   Extremity/Trunk Assessment Upper Extremity Assessment Upper Extremity Assessment: Overall WFL for tasks assessed   Lower Extremity Assessment Lower Extremity Assessment: Defer to PT evaluation   Cervical / Trunk Assessment Cervical / Trunk Assessment: Kyphotic   Communication Communication Communication: No difficulties   Cognition Arousal/Alertness: Awake/alert Behavior During Therapy: WFL for tasks assessed/performed Overall Cognitive Status: Within Functional Limits for tasks assessed                     General Comments       Exercises       Shoulder Instructions      Home Living Family/patient expects  to be discharged to:: Private residence Living Arrangements: Spouse/significant other Available Help at Discharge: Family;Available PRN/intermittently Type of Home: House Home Access: Stairs to enter [employer] of Steps: 1 Entrance Stairs-Rails: None Home Layout: One level     Bathroom Shower/Tub: Tub/shower unit Shower/tub characteristics: Curtain       Home  Equipment: Environmental consultant - 2 wheels;Bedside commode;Tub bench          Prior Functioning/Environment Level of Independence: Independent             OT Diagnosis: Generalized weakness;Blindness and low vision   OT Problem List: Decreased strength;Impaired balance (sitting and/or standing);Impaired vision/perception;Decreased knowledge of use of DME or AE   OT Treatment/Interventions: Self-care/ADL training;Therapeutic exercise;Therapeutic activities;Patient/family education;Balance training    OT Goals(Current goals can be found in the care plan section) Acute Rehab OT Goals Patient Stated Goal: get out of bed and get moving OT Goal Formulation: With patient Time For Goal Achievement: 05/04/15 Potential to Achieve Goals: Good ADL Goals Pt Will Perform Grooming: standing;with modified independence Pt Will Perform Lower Body Dressing: sit to/from stand;with modified independence Pt Will Transfer to Toilet: with supervision;ambulating;regular height toilet Pt Will Perform Tub/Shower Transfer: Tub transfer;with min guard assist;ambulating;rolling walker  OT Frequency: Min 2X/week   Barriers to D/C:            Co-evaluation              End of Session Equipment Utilized During Treatment: Gait belt;Rolling walker Nurse Communication: Mobility status  Activity Tolerance: Patient tolerated treatment well Patient left: in chair;with call bell/phone within reach   Time: 1245-1311 OT Time Calculation (min): 26 min Charges:  OT General Charges $OT Visit: 1 Procedure OT Evaluation $Initial OT Evaluation Tier I: 1 Procedure OT Treatments $Self Care/Home Management : 8-22 mins G-Codes:    Forest Gleason 04/20/2015, 1:51 PM

## 2015-04-20 NOTE — Evaluation (Signed)
SLP Cancellation Note  Patient Details Name: Nathaniel [name] MRN: [id number] DOB: [birth year]   Cancelled treatment:       Reason Eval/Treat Not Completed: Patient at procedure or test/unavailable pt having ECHO at this time.   Luanna Salk, [place] [facility] SLP [phone]

## 2015-04-20 NOTE — Evaluation (Signed)
Physical Therapy Evaluation Patient Details Name: Nathaniel [name] MRN: [id number] DOB: Mar 01, 1935 Today's Date: 04/20/2015   History of Present Illness  80 y.o. male with onset of L leg weakness and mild diffuse numbness and weakness. PMH: HTN, chronic low back pain, arthritis, L eye glaucoma/ blindnes  Clinical Impression  Pt very motivated, but generally unsteady and needs A for balance with turning.  Noted MRI negative for acute infarct, but pt does have mobility deficits.  Feel pt will need RW and a script for OPPT to work on balance and safe mobility at D/C.  Will continue to follow while on acute.      Follow Up Recommendations Outpatient PT;Supervision for mobility/OOB    Equipment Recommendations  Rolling walker with 5" wheels    Recommendations for Other Services       Precautions / Restrictions Precautions Precautions: Fall Restrictions Weight Bearing Restrictions: No      Mobility  Bed Mobility Overal bed mobility: Modified Independent                Transfers Overall transfer level: Needs assistance Equipment used: Rolling walker (2 wheeled) Transfers: Sit to/from Stand Sit to Stand: Min guard         General transfer comment: pt able to come to stand with MinG when he was able to transition his hands to RW.  Attempted coming to stand without RW and pt needed MinA to complete coming to standing.    Ambulation/Gait Ambulation/Gait assistance: Min assist Ambulation Distance (Feet): 80 Feet Assistive device: Rolling walker (2 wheeled) Gait Pattern/deviations: Step-through pattern;Decreased stride length;Ataxic;Drifts right/left (Leans to L side.)     General Gait Details: pt mildly unsteady and leans to L side.  pt is MinG to ambulate, but requires MinA for balance with turning.  pt mildly ataxic and fatigues easily.    Stairs            Wheelchair Mobility    Modified Rankin (Stroke Patients Only) Modified Rankin (Stroke Patients  Only) Pre-Morbid Rankin Score: No significant disability Modified Rankin: Moderately severe disability     Balance Overall balance assessment: Needs assistance Sitting-balance support: No upper extremity supported;Feet supported Sitting balance-Leahy Scale: Fair     Standing balance support: Bilateral upper extremity supported;During functional activity Standing balance-Leahy Scale: Poor                               Pertinent Vitals/Pain Pain Assessment: No/denies pain    Home Living Family/patient expects to be discharged to:: Private residence Living Arrangements: Spouse/significant other Available Help at Discharge: Family;Available PRN/intermittently Type of Home: House Home Access: Stairs to enter Entrance Stairs-Rails: None Entrance Stairs-Number of Steps: 1 Home Layout: One level Home Equipment: Walker - 2 wheels;Bedside commode;Tub bench (pt states RW is old.)      Prior Function Level of Independence: Independent               Hand Dominance        Extremity/Trunk Assessment   Upper Extremity Assessment: Defer to OT evaluation           Lower Extremity Assessment: LLE deficits/detail   LLE Deficits / Details: Strength and ROM WFL, but generally decreased coordination and fatigues quickly.  Hx neuropathies involving toes.  Cervical / Trunk Assessment: Kyphotic  Communication   Communication: No difficulties  Cognition Arousal/Alertness: Awake/alert Behavior During Therapy: WFL for tasks assessed/performed Overall Cognitive Status: Within Functional Limits for  tasks assessed                      General Comments      Exercises        Assessment/Plan    PT Assessment Patient needs continued PT services  PT Diagnosis Difficulty walking   PT Problem List Decreased activity tolerance;Decreased balance;Decreased mobility;Decreased coordination;Decreased knowledge of use of DME;Impaired sensation  PT Treatment  Interventions DME instruction;Gait training;Stair training;Functional mobility training;Therapeutic activities;Therapeutic exercise;Balance training;Neuromuscular re-education;Patient/family education   PT Goals (Current goals can be found in the Care Plan section) Acute Rehab PT Goals Patient Stated Goal: Keep moving. PT Goal Formulation: With patient Time For Goal Achievement: 04/27/15 Potential to Achieve Goals: Good    Frequency Min 3X/week   Barriers to discharge        Co-evaluation               End of Session Equipment Utilized During Treatment: Gait belt Activity Tolerance: Patient tolerated treatment well Patient left: in chair;with call bell/phone within reach Nurse Communication: Mobility status         Time: 1275-1700 PT Time Calculation (min) (ACUTE ONLY): 28 min   Charges:   PT Evaluation $Initial PT Evaluation Tier I: 1 Procedure PT Treatments $Gait Training: 8-22 mins   PT G CodesCatarina Hartshorn, [place] 04/20/2015, 12:42 PM

## 2015-04-20 NOTE — Progress Notes (Signed)
PROGRESS NOTE  Nathaniel [name]:[id number] DOB: 01/15/1935 DOA: 04/19/2015 PCP: Shirline Frees, MD  Assessment/Plan: Left leg weakness Patient has mild left leg weakness, CT head is negative.  MRI brain negative -will get MRI lumbar spine Neurology consult- plavix -PT eval  Hypertension Hold lisinopril at this time due to possibility of stroke.   Code Status: full Family Communication: patient Disposition Plan:    Consultants:    Procedures:      HPI/Subjective: Still with some numbness   Objective: Filed Vitals:   04/20/15 0943  BP: 144/82  Pulse: 71  Temp: 98.4 F (36.9 C)  Resp: 16    Intake/Output Summary (Last 24 hours) at 04/20/15 1142 Last data filed at 04/20/15 0942  Gross per 24 hour  Intake      0 ml  Output   1250 ml  Net  -1250 ml   Filed Weights   04/19/15 0955  Weight: 81.647 kg (180 lb)    Exam:   General:  A+Ox3, NAD  Cardiovascular: rrr  Respiratory: clear  Abdomen: +Bs, soft  Musculoskeletal: no edema   Data Reviewed: Basic Metabolic Panel:  Recent Labs Lab 04/19/15 1000 04/19/15 1643  NA 140  --   K 4.1  --   CL 110  --   CO2 23  --   GLUCOSE 111*  --   BUN 9  --   CREATININE 0.72 0.80  CALCIUM 9.0  --    Liver Function Tests:  Recent Labs Lab 04/19/15 1000  AST 16  ALT 10*  ALKPHOS 79  BILITOT 0.5  PROT 6.8  ALBUMIN 3.5   No results for input(s): LIPASE, AMYLASE in the last 168 hours. No results for input(s): AMMONIA in the last 168 hours. CBC:  Recent Labs Lab 04/19/15 1000 04/19/15 1643  WBC 7.1 7.3  NEUTROABS 4.0  --   HGB 12.8* 13.2  HCT 38.8* 41.9  MCV 96.8 98.4  PLT 273 266   Cardiac Enzymes:  Recent Labs Lab 04/19/15 1000  TROPONINI <0.03   BNP (last 3 results) No results for input(s): BNP in the last 8760 hours.  ProBNP (last 3 results) No results for input(s): PROBNP in the last 8760 hours.  CBG: No results for input(s): GLUCAP in the last 168 hours.  No  results found for this or any previous visit (from the past 240 hour(s)).   Studies: Dg Chest 2 View  04/19/2015   CLINICAL DATA:  Generalized weakness. Diaphoresis. Left-sided numbness. Dizziness.  EXAM: CHEST  2 VIEW  COMPARISON:  03/30/2015  FINDINGS: Numerous leads and wires project over the chest. Right glenohumeral joint osteoarthritis with high-riding right humeral head, suggesting chronic rotator cuff insufficiency. Midline trachea. Normal heart size. Tortuous thoracic aorta. No pleural effusion or pneumothorax. mild upper lung volume loss with right and possible left hilar retraction superiorly. Unchanged. Clearing of right lung base opacity. Mild left base scarring. Mild lower lobe predominant interstitial thickening.  IMPRESSION: No acute cardiopulmonary disease.  Peribronchial thickening which may relate to chronic bronchitis or smoking.   Electronically Signed   By: Abigail Miyamoto M.D.   On: 04/19/2015 10:49   Ct Head Wo Contrast  04/19/2015   CLINICAL DATA:  LEFT side weakness, near syncope, unable to bear weight on LEFT side today, leading to LEFT, former smoker  EXAM: CT HEAD WITHOUT CONTRAST  TECHNIQUE: Contiguous axial images were obtained from the base of the skull through the vertex without intravenous contrast.  COMPARISON:  None  FINDINGS:  Generalized atrophy.  Normal ventricular morphology.  No midline shift or mass effect.  Small old appearing white matter infarct in LEFT frontal region.  No intracranial hemorrhage, mass lesion or evidence acute infarction.  No extra-axial fluid collections.  Bones and sinuses unremarkable.  LEFT superior eyelid weight.  IMPRESSION: Small probable old white matter infarct LEFT frontal region.  Otherwise negative exam.   Electronically Signed   By: Lavonia Dana M.D.   On: 04/19/2015 10:34   Mr Brain Wo Contrast  04/20/2015   CLINICAL DATA:  Initial evaluation for acute left leg weakness.  EXAM: MRI HEAD WITHOUT CONTRAST  MRA HEAD WITHOUT CONTRAST   TECHNIQUE: Multiplanar, multiecho pulse sequences of the brain and surrounding structures were obtained without intravenous contrast. Angiographic images of the head were obtained using MRA technique without contrast.  COMPARISON:  Prior CT from 04/19/2015  FINDINGS: MRI HEAD FINDINGS  Diffuse prominence of the CSF containing spaces is compatible with generalized cerebral atrophy. Very mild chronic small vessel ischemic changes present within the periventricular white matter. Remote lacunar infarct present within the subcortical white matter of the anterior left frontal lobe.  No abnormal foci of restricted diffusion to suggest acute intracranial infarct identified. Gray-white matter differentiation maintained. Normal intravascular flow voids preserved. No acute or chronic intracranial hemorrhage.  No mass lesion or midline shift. No mass effect. No hydrocephalus. No extra-axial fluid collection.  Craniocervical junction within normal limits. Multilevel degenerative changes noted within the visualized upper cervical spine. Pituitary gland normal.  No acute abnormality about the orbits.  Mild mucosal thickening present within the left maxillary sinus and ethmoidal air cells. No air-fluid level to suggest active sinus infection. No mastoid effusion. Inner ear structures normal.  Bone marrow signal intensity within normal limits. Scalp soft tissues unremarkable.  MRA HEAD FINDINGS  ANTERIOR CIRCULATION:  Visualized distal cervical segments of the internal carotid arteries are widely patent with antegrade flow. The petrous, cavernous, supraclinoid segments are widely patent. The A1 segments, anterior communicating artery, and anterior cerebral arteries are well opacified. M1 segments widely patent without stenosis or occlusion. MCA bifurcations normal. MCA branches within normal limits and well opacified bilaterally.  POSTERIOR CIRCULATION:  Vertebral arteries are widely patent to the vertebrobasilar junction. Posterior  inferior cerebral arteries are patent. Basilar artery is widely patent. Superior cerebellar and posterior cerebral arteries widely patent bilaterally.  No aneurysm or vascular malformation.  IMPRESSION: MRI HEAD IMPRESSION:  1. No acute intracranial infarct or other abnormality identified. 2. 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 MRA HEAD IMPRESSION:  Normal MRA of the intracranial circulation.   Electronically Signed   By: Jeannine Boga M.D.   On: 04/20/2015 06:55   Mr Jodene Nam Head/brain Wo Cm  04/20/2015   CLINICAL DATA:  Initial evaluation for acute left leg weakness.  EXAM: MRI HEAD WITHOUT CONTRAST  MRA HEAD WITHOUT CONTRAST  TECHNIQUE: Multiplanar, multiecho pulse sequences of the brain and surrounding structures were obtained without intravenous contrast. Angiographic images of the head were obtained using MRA technique without contrast.  COMPARISON:  Prior CT from 04/19/2015  FINDINGS: MRI HEAD FINDINGS  Diffuse prominence of the CSF containing spaces is compatible with generalized cerebral atrophy. Very mild chronic small vessel ischemic changes present within the periventricular white matter. Remote lacunar infarct present within the subcortical white matter of the anterior left frontal lobe.  No abnormal foci of restricted diffusion to suggest acute intracranial infarct identified. Gray-white matter differentiation maintained. Normal intravascular flow voids preserved. No  acute or chronic intracranial hemorrhage.  No mass lesion or midline shift. No mass effect. No hydrocephalus. No extra-axial fluid collection.  Craniocervical junction within normal limits. Multilevel degenerative changes noted within the visualized upper cervical spine. Pituitary gland normal.  No acute abnormality about the orbits.  Mild mucosal thickening present within the left maxillary sinus and ethmoidal air cells. No air-fluid level to suggest active sinus infection. No mastoid effusion. Inner  ear structures normal.  Bone marrow signal intensity within normal limits. Scalp soft tissues unremarkable.  MRA HEAD FINDINGS  ANTERIOR CIRCULATION:  Visualized distal cervical segments of the internal carotid arteries are widely patent with antegrade flow. The petrous, cavernous, supraclinoid segments are widely patent. The A1 segments, anterior communicating artery, and anterior cerebral arteries are well opacified. M1 segments widely patent without stenosis or occlusion. MCA bifurcations normal. MCA branches within normal limits and well opacified bilaterally.  POSTERIOR CIRCULATION:  Vertebral arteries are widely patent to the vertebrobasilar junction. Posterior inferior cerebral arteries are patent. Basilar artery is widely patent. Superior cerebellar and posterior cerebral arteries widely patent bilaterally.  No aneurysm or vascular malformation.  IMPRESSION: MRI HEAD IMPRESSION:  1. No acute intracranial infarct or other abnormality identified. 2. 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 MRA HEAD IMPRESSION:  Normal MRA of the intracranial circulation.   Electronically Signed   By: Jeannine Boga M.D.   On: 04/20/2015 06:55    Scheduled Meds: . baclofen  10 mg Oral QID  . brimonidine  1 drop Both Eyes BID  . clopidogrel  75 mg Oral Daily  . dorzolamide-timolol  1 drop Both Eyes BID  . enoxaparin (LOVENOX) injection  40 mg Subcutaneous Q24H  . folic acid  1 mg Oral QHS  . gabapentin  800 mg Oral TID PC & HS  . latanoprost  1 drop Both Eyes QHS  . loratadine  10 mg Oral Daily   Continuous Infusions:  Antibiotics Given (last 72 hours)    None      Active Problems:   Osteoarthritis of right knee   Stroke   Leg weakness    Time spent: 25 min    Robt Okuda  [facility] Pager [phone]. If 7PM-7AM, please contact night-coverage at [email or web address], password [facility] 04/20/2015, 11:42 AM  LOS: 1 day

## 2015-04-21 DIAGNOSIS — R29898 Other symptoms and signs involving the musculoskeletal system: Secondary | ICD-10-CM | POA: Diagnosis not present

## 2015-04-21 LAB — HEMOGLOBIN A1C
Hgb A1c MFr Bld: 6.3 % — ABNORMAL HIGH (ref 4.8–5.6)
Mean Plasma Glucose: 134 mg/dL

## 2015-04-21 MED ORDER — CLOPIDOGREL BISULFATE 75 MG PO TABS: 75.0000 mg | ORAL_TABLET | Freq: Every day | ORAL | Status: DC

## 2015-04-21 NOTE — Progress Notes (Signed)
Occupational Therapy Treatment Patient Details Name: ALAND CHESTNUTT MRN: [id number] DOB: 1935-01-19 Today's Date: 04/21/2015    History of present illness 80 y.o. male with onset of L leg weakness and mild diffuse numbness and weakness. PMH: HTN, chronic low back pain, arthritis, L eye glaucoma/ blindness   OT comments  Pt progressing. Education provided in session. Pt moving well in session and planning to d/c today.  Follow Up Recommendations  Outpatient OT;Supervision - Intermittent    Equipment Recommendations  None recommended by OT    Recommendations for Other Services      Precautions / Restrictions Precautions Precautions: Fall Restrictions Weight Bearing Restrictions: No       Mobility Bed Mobility               General bed mobility comments: not assessed  Transfers Overall transfer level: Needs assistance   Transfers: Sit to/from Stand Sit to Stand: Supervision         General transfer comment: cues for technique    Balance      No LOB in session. Used walker for ambulation.                              ADL Overall ADL's : Needs assistance/impaired     Grooming: Wash/dry hands;Wash/dry face;Oral care;Supervision/safety;Standing (cue to locate hand soap on left)                 Lower Body Dressing Details (indicate cue type and reason): able to reach down to both feet Toilet Transfer: Supervision/safety;Ambulation;RW;Comfort height toilet;Grab bars;Regular Museum/gallery exhibitions officer and Hygiene: Supervision/safety;Sit to/from stand (cues to move gown out of way)   Tub/ Shower Transfer: Tub transfer;Rolling walker;Supervision/safety;Set up (used shower bench in bathroom to simulate tub bench)   Functional mobility during ADLs: Supervision/safety;Rolling walker General ADL Comments: Educated on safety such as safe footwear, use of bag on walker, rugs/items on floor, sitting for most of LB ADLs, and recommended  someone be with him for tub transfer.  Pt able to retrieve grooming items in room.       Vision  reports blurry vision when taking some meds                   Perception     Praxis      Cognition  Awake/Alert Behavior During Therapy: WFL for tasks assessed/performed Overall Cognitive Status: Within Functional Limits for tasks assessed                       Extremity/Trunk Assessment               Exercises     Shoulder Instructions       General Comments      Pertinent Vitals/ Pain       Pain Assessment: No/denies pain  Home Living                                          Prior Functioning/Environment              Frequency Min 2X/week     Progress Toward Goals  OT Goals(current goals can now be found in the care plan section)  Progress towards OT goals: Progressing toward goals  Acute Rehab OT Goals Patient Stated Goal: go home OT  Goal Formulation: With patient Time For Goal Achievement: 05/04/15 Potential to Achieve Goals: Good ADL Goals Pt Will Perform Grooming: standing;with modified independence Pt Will Perform Lower Body Dressing: sit to/from stand;with modified independence Pt Will Transfer to Toilet: with supervision;ambulating;regular height toilet Pt Will Perform Tub/Shower Transfer: Tub transfer;with min guard assist;ambulating;rolling walker  Plan Discharge plan needs to be updated    Co-evaluation                 End of Session Equipment Utilized During Treatment: Gait belt;Rolling walker   Activity Tolerance Patient tolerated treatment well   Patient Left in chair;with call bell/phone within reach;with chair alarm set   Nurse Communication          Time: [phone] (around 2 minutes using toilet) OT Time Calculation (min): 17 min  Charges: OT General Charges $OT Visit: 1 Procedure OT Treatments $Self Care/Home Management : 8-22 mins  Benito Mccreedy  OTR/L [phone] 04/21/2015, 11:45 AM

## 2015-04-21 NOTE — Discharge Summary (Signed)
Physician Discharge Summary  Nathaniel [name] YQM:[id number] DOB: [birth year] DOA: 04/19/2015  PCP: Shirline Frees, MD  Admit date: 04/19/2015 Discharge date: 04/21/2015  Time spent: 35 minutes  Recommendations for Outpatient Follow-up:  1. Outpatient PT- if not improved would follow up with [facility] lab  Discharge Diagnoses:  Active Problems:   Osteoarthritis of right knee   Leg weakness   Discharge Condition: improved  Diet recommendation: cardiac  Filed Weights   04/19/15 0955  Weight: 81.647 kg (180 lb)    History of present illness:  80 year old male who  has a past medical history of Arthritis; Environmental allergies; Blood transfusion (2005); Glaucoma of left eye; Colon polyps; and Erectile dysfunction. today presented to the ED after developing left leg weakness since this morning. Patient says that last night while he was kneeling down for his prayers he felt dizzy and was lightheaded after that became sweaty and had generalized weakness. Patient denies any chest pain or shortness of breath did not pass out. No history of seizures. Patient says that he went to bed last night and woke up this morning to find that his left leg was weak and was unable to be a weight on it. Denies any history of slurred speech, he is blind in left eye, denies any blurred vision from right eye. Denies any headache, no nausea, vomiting or diarrhea. No fever dysuria urgency or frequency of urination.  In the ED CT head was negative. Patient transferred to [facility] for further evaluation.  Hospital Course:  80 yo M with mild leg weakness. MRI of brain negative for CVA MRI L-spine not impressive: continue working with PT, but no further evaluation per neuro  Procedures:  echo  Consultations:  neuro  Discharge Exam: Filed Vitals:   04/21/15 0920  BP: 133/85  Pulse: 78  Temp: 98.6 F (37 C)  Resp: 18    General: A+Ox3, NAd Cardiovascular: rrr Respiratory:  clear  Discharge Instructions   Discharge Instructions    Diet - low sodium heart healthy    Complete by:  As directed      Discharge instructions    Complete by:  As directed   Outpatient PT If symptoms are not better with PT then schedule appointment with Neurosurgeon     Increase activity slowly    Complete by:  As directed           Current Discharge Medication List    START taking these medications   Details  clopidogrel (PLAVIX) 75 MG tablet Take 1 tablet (75 mg total) by mouth daily. Qty: 30 tablet, Refills: 0      CONTINUE these medications which have NOT CHANGED   Details  baclofen (LIORESAL) 10 MG tablet Take 10 mg by mouth 4 (four) times daily.    brimonidine (ALPHAGAN) 0.15 % ophthalmic solution Place 1 drop into both eyes 2 (two) times daily.     cetirizine (ZYRTEC) 10 MG tablet Take 10 mg by mouth daily.    dorzolamide-timolol (COSOPT) 22.3-6.8 MG/ML ophthalmic solution Place 1 drop into both eyes 2 (two) times daily.    folic acid (FOLVITE) 1 MG tablet Take 1 mg by mouth at bedtime.     gabapentin (NEURONTIN) 800 MG tablet Take 800 mg by mouth 4 (four) times daily.    HYDROcodone-acetaminophen (NORCO) 10-325 MG per tablet Take 1 tablet by mouth every 4 (four) hours as needed. Refills: 0    lisinopril (PRINIVIL,ZESTRIL) 20 MG tablet Take 20 mg by mouth  at bedtime.     methotrexate (RHEUMATREX) 2.5 MG tablet Take 15 mg by mouth once a week.     Travoprost, BAK Free, (TRAVATAN) 0.004 % SOLN ophthalmic solution Place 1 drop into both eyes at bedtime.    Vitamin Mixture (VITAMIN E COMPLETE) CAPS Take 1 capsule by mouth daily as needed (for supplementation).      STOP taking these medications     warfarin (COUMADIN) 5 MG tablet        Allergies  Allergen Reactions  . Ibuprofen Hives    Conflict with other meds      The results of significant diagnostics from this hospitalization (including imaging, microbiology, ancillary and laboratory) are  listed below for reference.    Significant Diagnostic Studies: Dg Chest 2 View  04/19/2015   CLINICAL DATA:  Generalized weakness. Diaphoresis. Left-sided numbness. Dizziness.  EXAM: CHEST  2 VIEW  COMPARISON:  03/30/2015  FINDINGS: Numerous leads and wires project over the chest. Right glenohumeral joint osteoarthritis with high-riding right humeral head, suggesting chronic rotator cuff insufficiency. Midline trachea. Normal heart size. Tortuous thoracic aorta. No pleural effusion or pneumothorax. mild upper lung volume loss with right and possible left hilar retraction superiorly. Unchanged. Clearing of right lung base opacity. Mild left base scarring. Mild lower lobe predominant interstitial thickening.  IMPRESSION: No acute cardiopulmonary disease.  Peribronchial thickening which may relate to chronic bronchitis or smoking.   Electronically Signed   By: Abigail Miyamoto M.D.   On: 04/19/2015 10:49   Dg Chest 2 View  03/30/2015   CLINICAL DATA:  Acute bronchitis. Cough for 2 weeks. Right-sided chest pain for 1 week. Fever.  EXAM: CHEST  2 VIEW  COMPARISON:  01/04/2004  FINDINGS: Mild bilateral chronic hilar prominence. Equivocal leftward upward hilar retraction with biapical pleural parenchymal scarring.  Atherosclerotic aortic arch. Thoracic spondylosis. Heart size normal.  Airway thickening is present, suggesting bronchitis or reactive airways disease. Indistinct peripheral airspace opacity at the right lung base. Given the right chest pain and fever, the possibility of early pneumonia is raised.  IMPRESSION: 1. Indistinct airspace opacity peripherally at the right lung base. Given the right chest pain and fever, the possibility of bronchopneumonia is raised. 2. Airway thickening is present, suggesting bronchitis or reactive airways disease. 3. Chronic bilateral hilar prominence with upward retraction of the left hilum likely due to the chronically prominent pleural parenchymal scarring. Although possibly  from old granulomatous disease, pneumoconiosis and silicosis can be associated with upward hilar retraction. The appearance is not striking enough to labile progressive massive fibrosis.   Electronically Signed   By: Van Clines M.D.   On: 03/30/2015 16:59   Ct Head Wo Contrast  04/19/2015   CLINICAL DATA:  LEFT side weakness, near syncope, unable to bear weight on LEFT side today, leading to LEFT, former smoker  EXAM: CT HEAD WITHOUT CONTRAST  TECHNIQUE: Contiguous axial images were obtained from the base of the skull through the vertex without intravenous contrast.  COMPARISON:  None  FINDINGS: Generalized atrophy.  Normal ventricular morphology.  No midline shift or mass effect.  Small old appearing white matter infarct in LEFT frontal region.  No intracranial hemorrhage, mass lesion or evidence acute infarction.  No extra-axial fluid collections.  Bones and sinuses unremarkable.  LEFT superior eyelid weight.  IMPRESSION: Small probable old white matter infarct LEFT frontal region.  Otherwise negative exam.   Electronically Signed   By: Lavonia Dana M.D.   On: 04/19/2015 10:34   Mr Brain  Wo Contrast  04/20/2015   CLINICAL DATA:  Initial evaluation for acute left leg weakness.  EXAM: MRI HEAD WITHOUT CONTRAST  MRA HEAD WITHOUT CONTRAST  TECHNIQUE: Multiplanar, multiecho pulse sequences of the brain and surrounding structures were obtained without intravenous contrast. Angiographic images of the head were obtained using MRA technique without contrast.  COMPARISON:  Prior CT from 04/19/2015  FINDINGS: MRI HEAD FINDINGS  Diffuse prominence of the CSF containing spaces is compatible with generalized cerebral atrophy. Very mild chronic small vessel ischemic changes present within the periventricular white matter. Remote lacunar infarct present within the subcortical white matter of the anterior left frontal lobe.  No abnormal foci of restricted diffusion to suggest acute intracranial infarct identified.  Gray-white matter differentiation maintained. Normal intravascular flow voids preserved. No acute or chronic intracranial hemorrhage.  No mass lesion or midline shift. No mass effect. No hydrocephalus. No extra-axial fluid collection.  Craniocervical junction within normal limits. Multilevel degenerative changes noted within the visualized upper cervical spine. Pituitary gland normal.  No acute abnormality about the orbits.  Mild mucosal thickening present within the left maxillary sinus and ethmoidal air cells. No air-fluid level to suggest active sinus infection. No mastoid effusion. Inner ear structures normal.  Bone marrow signal intensity within normal limits. Scalp soft tissues unremarkable.  MRA HEAD FINDINGS  ANTERIOR CIRCULATION:  Visualized distal cervical segments of the internal carotid arteries are widely patent with antegrade flow. The petrous, cavernous, supraclinoid segments are widely patent. The A1 segments, anterior communicating artery, and anterior cerebral arteries are well opacified. M1 segments widely patent without stenosis or occlusion. MCA bifurcations normal. MCA branches within normal limits and well opacified bilaterally.  POSTERIOR CIRCULATION:  Vertebral arteries are widely patent to the vertebrobasilar junction. Posterior inferior cerebral arteries are patent. Basilar artery is widely patent. Superior cerebellar and posterior cerebral arteries widely patent bilaterally.  No aneurysm or vascular malformation.  IMPRESSION: MRI HEAD IMPRESSION:  1. No acute intracranial infarct or other abnormality identified. 2. 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 MRA HEAD IMPRESSION:  Normal MRA of the intracranial circulation.   Electronically Signed   By: Jeannine Boga M.D.   On: 04/20/2015 06:55   Mr Lumbar Spine Wo Contrast  04/20/2015   CLINICAL DATA:  Numbness.  Left leg weakness.  EXAM: MRI LUMBAR SPINE WITHOUT CONTRAST  TECHNIQUE: Multiplanar,  multisequence MR imaging of the lumbar spine was performed. No intravenous contrast was administered.  COMPARISON:  None.  FINDINGS: Markedly distended urinary bladder.  Normal lumbar alignment. Chronic mild fractures of T12 and L1. No acute fracture or mass. Fatty infiltration of the filum terminale at L3-4. Conus medullaris is lower limit of normal, ending at L2-3. No conus lesion  T12-L1:  Negative for stenosis.  Mild degenerative change  L1-2:  Mild degenerative change without significant spinal stenosis  L2-3: Disc bulging and facet hypertrophy. Mild narrowing of the canal  L3-4: Moderate disc degeneration with diffuse disc bulging and spurring. Bilateral facet hypertrophy. Moderate spinal stenosis.  L4-5: Disc degeneration and spondylosis with diffuse endplate osteophyte formation. Moderate spinal stenosis. Moderate foraminal encroachment bilaterally. Bilateral facet hypertrophy.  L5-S1: Disc degeneration and spondylosis. Bilateral facet hypertrophy with moderate foraminal encroachment bilaterally  IMPRESSION: Urinary bladder is markedly distended  Low lying conus medullaris with fatty change in the filum terminale.  Lumbar degenerative change. Moderate spinal stenosis L3-4 and L4-5. Bilateral foraminal encroachment L4-5 and L5-S1.   Electronically Signed   By: Franchot Gallo M.D.  On: 04/20/2015 19:49   Mr Jodene Nam Head/brain Wo Cm  04/20/2015   CLINICAL DATA:  Initial evaluation for acute left leg weakness.  EXAM: MRI HEAD WITHOUT CONTRAST  MRA HEAD WITHOUT CONTRAST  TECHNIQUE: Multiplanar, multiecho pulse sequences of the brain and surrounding structures were obtained without intravenous contrast. Angiographic images of the head were obtained using MRA technique without contrast.  COMPARISON:  Prior CT from 04/19/2015  FINDINGS: MRI HEAD FINDINGS  Diffuse prominence of the CSF containing spaces is compatible with generalized cerebral atrophy. Very mild chronic small vessel ischemic changes present within the  periventricular white matter. Remote lacunar infarct present within the subcortical white matter of the anterior left frontal lobe.  No abnormal foci of restricted diffusion to suggest acute intracranial infarct identified. Gray-white matter differentiation maintained. Normal intravascular flow voids preserved. No acute or chronic intracranial hemorrhage.  No mass lesion or midline shift. No mass effect. No hydrocephalus. No extra-axial fluid collection.  Craniocervical junction within normal limits. Multilevel degenerative changes noted within the visualized upper cervical spine. Pituitary gland normal.  No acute abnormality about the orbits.  Mild mucosal thickening present within the left maxillary sinus and ethmoidal air cells. No air-fluid level to suggest active sinus infection. No mastoid effusion. Inner ear structures normal.  Bone marrow signal intensity within normal limits. Scalp soft tissues unremarkable.  MRA HEAD FINDINGS  ANTERIOR CIRCULATION:  Visualized distal cervical segments of the internal carotid arteries are widely patent with antegrade flow. The petrous, cavernous, supraclinoid segments are widely patent. The A1 segments, anterior communicating artery, and anterior cerebral arteries are well opacified. M1 segments widely patent without stenosis or occlusion. MCA bifurcations normal. MCA branches within normal limits and well opacified bilaterally.  POSTERIOR CIRCULATION:  Vertebral arteries are widely patent to the vertebrobasilar junction. Posterior inferior cerebral arteries are patent. Basilar artery is widely patent. Superior cerebellar and posterior cerebral arteries widely patent bilaterally.  No aneurysm or vascular malformation.  IMPRESSION: MRI HEAD IMPRESSION:  1. No acute intracranial infarct or other abnormality identified. 2. 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 MRA HEAD IMPRESSION:  Normal MRA of the intracranial circulation.   Electronically Signed    By: Jeannine Boga M.D.   On: 04/20/2015 06:55    Microbiology: No results found for this or any previous visit (from the past 240 hour(s)).   Labs: Basic Metabolic Panel:  Recent Labs Lab 04/19/15 1000 04/19/15 1643  NA 140  --   K 4.1  --   CL 110  --   CO2 23  --   GLUCOSE 111*  --   BUN 9  --   CREATININE 0.72 0.80  CALCIUM 9.0  --    Liver Function Tests:  Recent Labs Lab 04/19/15 1000  AST 16  ALT 10*  ALKPHOS 79  BILITOT 0.5  PROT 6.8  ALBUMIN 3.5   No results for input(s): LIPASE, AMYLASE in the last 168 hours. No results for input(s): AMMONIA in the last 168 hours. CBC:  Recent Labs Lab 04/19/15 1000 04/19/15 1643  WBC 7.1 7.3  NEUTROABS 4.0  --   HGB 12.8* 13.2  HCT 38.8* 41.9  MCV 96.8 98.4  PLT 273 266   Cardiac Enzymes:  Recent Labs Lab 04/19/15 1000  TROPONINI <0.03   BNP: BNP (last 3 results) No results for input(s): BNP in the last 8760 hours.  ProBNP (last 3 results) No results for input(s): PROBNP in the last 8760 hours.  CBG: No results  for input(s): GLUCAP in the last 168 hours.     SignedEulogio Bear  [facility] 04/21/2015, 11:40 AM

## 2015-04-21 NOTE — Progress Notes (Signed)
CM talked to patient and wife in room. Patient chose [facility] for DME Rolling walker and has previously elected [facility] for outpatient PT.  Current CM called Easter @ [phone] to arrange for rolling walker.  Wife reports she will be supporting and supervising patient.  Patient and wife reports arrangements already made for PT.

## 2015-04-21 NOTE — Progress Notes (Signed)
Patient being discharged home with wife no pain and able to ambulate with minimal assistance IV removed he is alert. Case manager is obtaining a walker for him to take home prescription for Plavix was give to wife will call [facility]ers to transport to [address] exit.

## 2015-04-21 NOTE — Progress Notes (Signed)
Physical Therapy Treatment Patient Details Name: DAQUAWN SEELMAN MRN: [id number] DOB: [birth year] Today's Date: 04/21/2015    History of Present Illness 80 y.o. male with onset of L leg weakness and mild diffuse numbness and weakness. PMH: HTN, chronic low back pain, arthritis, L eye glaucoma/ blindness    PT Comments    Pt progressing well with mobility.  He reports feels back at baseline with strength & balance.  Eager to d/c home.    Follow Up Recommendations  Outpatient PT;Supervision for mobility/OOB     Equipment Recommendations  Rolling walker with 5" wheels    Recommendations for Other Services       Precautions / Restrictions Precautions Precautions: Fall Restrictions Weight Bearing Restrictions: No    Mobility  Bed Mobility               General bed mobility comments: not assessed  Transfers Overall transfer level: Needs assistance Equipment used: Rolling walker (2 wheeled) Transfers: Sit to/from Stand Sit to Stand: Supervision         General transfer comment: cues for technique  Ambulation/Gait Ambulation/Gait assistance: Min guard Ambulation Distance (Feet): 150 Feet Assistive device: Rolling walker (2 wheeled) Gait Pattern/deviations: Step-through pattern;Decreased stride length Gait velocity: decreased   General Gait Details: Guarding for safety but no unsteadiness noted.     Stairs            Wheelchair Mobility    Modified Rankin (Stroke Patients Only)       Balance                                    Cognition Arousal/Alertness: Awake/alert Behavior During Therapy: WFL for tasks assessed/performed Overall Cognitive Status: Within Functional Limits for tasks assessed                      Exercises      General Comments        Pertinent Vitals/Pain Pain Assessment: No/denies pain    Home Living                      Prior Function            PT Goals (current goals can now be  found in the care plan section) Acute Rehab PT Goals Patient Stated Goal: go home PT Goal Formulation: With patient Time For Goal Achievement: 04/27/15 Potential to Achieve Goals: Good Progress towards PT goals: Progressing toward goals    Frequency  Min 3X/week    PT Plan Current plan remains appropriate    Co-evaluation             End of Session Equipment Utilized During Treatment: Gait belt Activity Tolerance: Patient tolerated treatment well Patient left: in chair;with call bell/phone within reach;with chair alarm set     Time: 1053-1105 PT Time Calculation (min) (ACUTE ONLY): 12 min  Charges:  $Gait Training: 8-22 mins                    G Codes:      Sena Hitch 04/21/2015, 11:46 AM   Sarajane Marek, PTA [phone] 04/21/2015

## 2015-04-21 NOTE — Care Management Note (Signed)
Case Management Note  Patient Details  Name: Nathaniel [name] MRN: [id number] Date of Birth: 1935/03/27  Subjective/Objective:         L leg weakness and mild diffuse numbness and weakness.           Action/Plan:  D/C with rolling walker and will f/u with outpatient PT at [facility] Expected Discharge Date:       04/21/15           Expected Discharge Plan:  [place]  In-House Referral:     Discharge planning Services  CM Consult  Post Acute Care Choice:    Choice offered to:  Patient  DME Arranged:   [facility]ie [facility] DME Agency:   [facility]  HH Arranged:    [place] Agency:  Other - See comment ([facility] home for outpatient PT)  Status of Service:  Completed, signed off  Medicare Important Message Given:    Date Medicare IM Given:    Medicare IM give by:    Date Additional Medicare IM Given:    Additional Medicare Important Message give by:     If discussed at [place] of Stay Meetings, dates discussed:    Additional Comments:  Dimas Aguas, RN 04/21/2015, 2:54 PM

## 2015-04-24 NOTE — Progress Notes (Signed)
OT NOTE- LATE GCODE ENTRY      04-28-2015 1300  OT G-codes **NOT FOR INPATIENT CLASS**  Functional Assessment Tool Used clinical judgement  Functional Limitation Self care  Self Care Current Status (X3235) CJ  Self Care Goal Status (T7322) CI  Self Care Discharge Status (G2542) Elsie Stain   OTR/L Pager: [phone] Office: [phone] .

## 2015-04-25 NOTE — Progress Notes (Signed)
Physical Therapy Note  Late entry for missed G-code.      04/20/15 1202  PT Visit Information  Last PT Received On 2015/05/14  PT G-Codes **NOT FOR INPATIENT CLASS**  Functional Assessment Tool Used Clinical Judgement  Functional Limitation Mobility: Walking and moving around  Mobility: Walking and Moving Around Current Status [phone]) CI  Mobility: Walking and Moving Around Goal Status [phone]) [place], [place]

## 2015-05-01 ENCOUNTER — Ambulatory Visit: Payer: Medicare Other | Attending: Internal Medicine | Admitting: Physical Therapy

## 2015-05-01 ENCOUNTER — Encounter: Payer: Self-pay | Admitting: Physical Therapy

## 2015-05-01 DIAGNOSIS — R296 Repeated falls: Secondary | ICD-10-CM | POA: Diagnosis not present

## 2015-05-01 DIAGNOSIS — R262 Difficulty in walking, not elsewhere classified: Secondary | ICD-10-CM | POA: Diagnosis present

## 2015-05-01 NOTE — Therapy (Signed)
[address] [place], [place], [address] Phone: [phone]   Fax:  [phone]  Physical Therapy Evaluation  Patient Details  Name: Nathaniel [name] MRN: [id number] Date of Birth: Jun 15, 1935 Referring Provider:  Geradine Girt, DO  Encounter Date: 05/01/2015      PT End of Session - 05/01/15 1641    Visit Number 1   Date for PT Re-Evaluation 07/01/15   PT Start Time 9233   PT Stop Time 1445   PT Time Calculation (min) 60 min   Activity Tolerance Patient tolerated treatment well   Behavior During Therapy [facility] for tasks assessed/performed      Past Medical History  Diagnosis Date  . Arthritis   . Environmental allergies     pollen , leaves, grass  . Blood transfusion 2005    s/p hip replacement  . Glaucoma of left eye     legally blind in left eye  . Colon polyps   . Erectile dysfunction     Past Surgical History  Procedure Laterality Date  . Joint replacement    . Multiple tooth extractions  2012    had 23 teeth pulled  . Eye surgery    . Cataract extraction      bilateral  . Glaucoma valve insertion  0076,2263    Left eye  . Hip arthroplasty  2005    Right Hip replacement x 3  . Total knee arthroplasty  12/15/2011    Procedure: TOTAL KNEE ARTHROPLASTY;  Surgeon: Kerin Salen, MD;  Location: [place];  Service: Orthopedics;  Laterality: Right;  DEPUY SIGMA   . Toenail excision      There were no vitals filed for this visit.  Visit Diagnosis:  Difficulty walking - Plan: PT plan of care cert/re-cert  Frequent falls - Plan: PT plan of care cert/re-cert      Subjective Assessment - 05/01/15 1356    Subjective Patient with diagnosis of left leg weakness.  Has a history of stroke, but he reports that a recent MRI shows that the issue is a pinched nerve in the back.  Reports that he was kneeling down to pray and felt  he could not get up and describes numbness on the left leg.  This occurred  04/18/15   Pertinent History right TKR 2013   Limitations Walking;House hold activities   How long can you stand comfortably? 30   Diagnostic tests MRI of the lumbar spine shows spinal stenosis L3-S1   Patient Stated Goals more strength in the left side, "get back in high gear"   Currently in Pain? No/denies  takes pain meds for arthritis            [facility] PT Assessment - 05/01/15 0001    Assessment   Medical Diagnosis left leg weakness, difficulty walking   Onset Date/Surgical Date 04/18/15   Precautions   Precautions Fall   Balance Screen   Has the patient fallen in the past 6 months Yes   How many times? 4   Has the patient had a decrease in activity level because of a fear of falling?  Yes   Is the patient reluctant to leave their home because of a fear of falling?  No   Home Environment   Living Environment Private residence   Living Arrangements Spouse/significant other   Additional Comments does house and yardwork, some gardening   Prior Function   Level of Muddy  Vocation Retired   Leisure garden, has a treadmill and stationary bike   Advice worker Comments Left LE strength = to the right at 4-/5   Ambulation/Gait   Gait Comments Came to clinic today without assistive device, tried him using a SPC, seemed to be slower and  a little more unsteady using due to sequencing issues.  Had a wide BOS, slight limp on the left, was unsteady with turns and tended to go onto heels and backward with LOB   Standardized Balance Assessment   Standardized Balance Assessment Timed Up and Go Test;Berg Balance Test   Berg Balance Test   Sit to Stand Able to stand  independently using hands   Standing Unsupported Able to stand safely 2 minutes   Sitting with Back Unsupported but Feet Supported on Floor or Stool Able to sit safely and securely 2 minutes   Stand to Sit Sits safely with minimal use of hands   Transfers Able to transfer safely, definite need  of hands   Standing Unsupported with Eyes Closed Able to stand 10 seconds safely   Standing Ubsupported with Feet Together Able to place feet together independently and stand 1 minute safely   From Standing, Reach Forward with Outstretched Arm Can reach forward >12 cm safely (5")   From Standing Position, Pick up Object from Floor Able to pick up shoe safely and easily   From Standing Position, Turn to Look Behind Over each Shoulder Looks behind from both sides and weight shifts well   Turn 360 Degrees Able to turn 360 degrees safely one side only in 4 seconds or less   Standing Unsupported, Alternately Place Feet on Step/Stool Able to complete 4 steps without aid or supervision   Standing Unsupported, One Foot in Front Able to take small step independently and hold 30 seconds   Standing on One Leg Tries to lift leg/unable to hold 3 seconds but remains standing independently   Total Score 45   Timed Up and Go Test   Normal TUG (seconds) 20                           PT Education - 05/01/15 1639    Education provided Yes   Education Details HEP to start balance activities, marches, kicks, side stepping   Person(s) Educated Patient   Methods Explanation;Demonstration;Handout;Verbal cues   Comprehension Verbalized understanding;Returned demonstration  had difficulty with toe walk and heel walk, also took away the brainding and tandem stance and walk.          PT Short Term Goals - 05/01/15 1653    PT SHORT TERM GOAL #1   Title independent with initial HEP   Time 2   Period Weeks   Status New           PT Long Term Goals - 05/01/15 1653    PT LONG TERM GOAL #1   Title no falls over a 6 week period   Time 8   Period Weeks   Status New   PT LONG TERM GOAL #2   Title increase Berg balance test score to 50/56 to decrease fall risk   Time 8   Period Weeks   Status New   PT LONG TERM GOAL #3   Title decrease TUG time to 14 seconds to decrease fall risk    Time 8   Period Weeks   Status New  Plan - May 27, 2015 1643    Clinical Impression Statement Patient with balance issues from "pinched nerve" per patient.  He hsa a history of possible stroke.  He reports he tends to fall to the back and has had 4 falls in the past 6 months.  He was unable to get up from floor recently.    Pt will benefit from skilled therapeutic intervention in order to improve on the following deficits Abnormal gait;Decreased activity tolerance;Decreased balance;Decreased endurance;Decreased mobility;Difficulty walking   Rehab Potential Good   PT Frequency 2x / week   PT Duration 8 weeks   PT Treatment/Interventions Gait training;Functional mobility training;Therapeutic activities;Therapeutic exercise;Balance training;Neuromuscular re-education;Patient/family education   PT Next Visit Plan Assess intial HEP compliance and safety, add gym exercises and high level balance activities   Consulted and Agree with Plan of Care Patient          G-Codes - 2015-05-27 1649    Functional Assessment Tool Used Merrilee Jansky   Functional Limitation Mobility: Walking and moving around   Mobility: Walking and Moving Around Current Status [phone]) At least 1 percent but less than 20 percent impaired, limited or restricted   Mobility: Walking and Moving Around Goal Status [phone]) At least 1 percent but less than 20 percent impaired, limited or restricted       Problem List Patient Active Problem List   Diagnosis Date Noted  . Leg weakness 04/19/2015  . Osteoarthritis of right knee 12/19/2011    Sumner Boast., PT 05-27-2015, 5:03 PM  [address] [place], [place], [address] Phone: [phone]   Fax:  [phone]

## 2015-05-09 ENCOUNTER — Ambulatory Visit: Payer: Medicare Other | Attending: Internal Medicine | Admitting: Rehabilitation

## 2015-05-09 DIAGNOSIS — R262 Difficulty in walking, not elsewhere classified: Secondary | ICD-10-CM | POA: Insufficient documentation

## 2015-05-09 NOTE — Therapy (Signed)
[place] [address] [place], [place], [address] Phone: [phone]   Fax:  [phone]  Physical Therapy Treatment  Patient Details  Name: Nathaniel [name] MRN: [id number] Date of Birth: [birth year] Referring Provider:  Geradine Girt, DO  Encounter Date: 05/09/2015      PT End of Session - 05/09/15 1529    Visit Number 2   PT Start Time 1400   PT Stop Time 1450   PT Time Calculation (min) 50 min      Past Medical History  Diagnosis Date  . Arthritis   . Environmental allergies     pollen , leaves, grass  . Blood transfusion 2005    s/p hip replacement  . Glaucoma of left eye     legally blind in left eye  . Colon polyps   . Erectile dysfunction     Past Surgical History  Procedure Laterality Date  . Joint replacement    . Multiple tooth extractions  2012    had 23 teeth pulled  . Eye surgery    . Cataract extraction      bilateral  . Glaucoma valve insertion  [id number]    Left eye  . Hip arthroplasty  2005    Right Hip replacement x 3  . Total knee arthroplasty  12/15/2011    Procedure: TOTAL KNEE ARTHROPLASTY;  Surgeon: Kerin Salen, MD;  Location: [place];  Service: Orthopedics;  Laterality: Right;  DEPUY SIGMA   . Toenail excision      There were no vitals filed for this visit.  Visit Diagnosis:  Difficulty walking      Subjective Assessment - 05/09/15 1524    Subjective Pt. reports doing HEP.   States his back is hurting today and he goes for LLE EMG/NCV 8/3.     He has a cane today and does fairly well with it.   Currently in Pain? Yes   Pain Score 6    Pain Location Back   Pain Orientation Lower   Pain Descriptors / Indicators Aching;Burning   Pain Type Chronic pain   Pain Onset More than a month ago   Aggravating Factors  standing and walker   Pain Relieving Factors heat, rest, pain meds   Effect of Pain on Daily Activities all limited                          [facility] Adult PT Treatment/Exercise - 05/09/15 0001    Exercises   Exercises Knee/Hip   Knee/Hip Exercises: Machines for Strengthening   Cybex Knee Extension 10# L x 15   Cybex Knee Flexion 25# x 1   Other Machine resisted gait 10# F/B/S/S   Knee/Hip Exercises: Standing   Other Standing Knee Exercises corner balance:  toe raise, heel raise, march x 15;  feet together/eyes closed, tandem stance x 1' BLE   Other Standing Knee Exercises red TB 3way hip x 15 BLE;  foam stand/ball toss x 3-4'                PT Education - 05/09/15 1528    Education provided Yes   Education Details corner balance   Person(s) Educated Patient   Methods Explanation;Demonstration   Comprehension Need further instruction;Verbal cues required;Tactile cues required          PT Short Term Goals - 05/01/15 1653    PT SHORT TERM GOAL #1  Title independent with initial HEP   Time 2   Period Weeks   Status New           PT Long Term Goals - 05/01/15 1653    PT LONG TERM GOAL #1   Title no falls over a 6 week period   Time 8   Period Weeks   Status New   PT LONG TERM GOAL #2   Title increase Berg balance test score to 50/56 to decrease fall risk   Time 8   Period Weeks   Status New   PT LONG TERM GOAL #3   Title decrease TUG time to 14 seconds to decrease fall risk   Time 8   Period Weeks   Status New               Plan - 05/09/15 1529    Clinical Impression Statement Pt. still feels LLE weakness is back related and is indeed c/o more pain today.   Gait remains widebased and unsteady, but some better with [facility]   PT Next Visit Plan Continue to advance balance ability with foam ex, braiding, tandem gait   Consulted and Agree with Plan of Care Patient        Problem List Patient Active Problem List   Diagnosis Date Noted  . Leg weakness 04/19/2015  . Osteoarthritis of right knee 12/19/2011    Ravon Mortellaro, PT 05/09/2015, 3:31 PM  [place] [address] [place], [place], [address] Phone: [phone]   Fax:  [phone]

## 2015-05-16 ENCOUNTER — Ambulatory Visit: Payer: Medicare Other | Admitting: Rehabilitation

## 2016-01-07 DIAGNOSIS — R42 Dizziness and giddiness: Secondary | ICD-10-CM | POA: Insufficient documentation

## 2016-03-07 ENCOUNTER — Other Ambulatory Visit: Payer: Self-pay | Admitting: Family Medicine

## 2016-03-07 ENCOUNTER — Ambulatory Visit
Admission: RE | Admit: 2016-03-07 | Discharge: 2016-03-07 | Disposition: A | Discharge status: Home / Self Care | Payer: Medicare Other | Source: Ambulatory Visit | Attending: Family Medicine | Admitting: Family Medicine

## 2016-03-07 DIAGNOSIS — R609 Edema, unspecified: Secondary | ICD-10-CM

## 2018-04-17 ENCOUNTER — Encounter (HOSPITAL_COMMUNITY): Payer: Self-pay | Admitting: Emergency Medicine

## 2018-04-17 ENCOUNTER — Emergency Department (HOSPITAL_COMMUNITY): Payer: Medicare Other

## 2018-04-17 ENCOUNTER — Other Ambulatory Visit: Payer: Self-pay

## 2018-04-17 ENCOUNTER — Observation Stay (HOSPITAL_COMMUNITY)
Admission: EM | Admit: 2018-04-17 | Discharge: 2018-04-18 | Disposition: A | Discharge status: Home / Self Care | Payer: Medicare Other | Attending: Internal Medicine | Admitting: Internal Medicine

## 2018-04-17 DIAGNOSIS — Z96651 Presence of right artificial knee joint: Secondary | ICD-10-CM | POA: Insufficient documentation

## 2018-04-17 DIAGNOSIS — M1990 Unspecified osteoarthritis, unspecified site: Secondary | ICD-10-CM | POA: Diagnosis not present

## 2018-04-17 DIAGNOSIS — Z87891 Personal history of nicotine dependence: Secondary | ICD-10-CM | POA: Insufficient documentation

## 2018-04-17 DIAGNOSIS — R410 Disorientation, unspecified: Secondary | ICD-10-CM

## 2018-04-17 DIAGNOSIS — R531 Weakness: Secondary | ICD-10-CM | POA: Diagnosis present

## 2018-04-17 DIAGNOSIS — Z7902 Long term (current) use of antithrombotics/antiplatelets: Secondary | ICD-10-CM | POA: Insufficient documentation

## 2018-04-17 DIAGNOSIS — H5462 Unqualified visual loss, left eye, normal vision right eye: Secondary | ICD-10-CM | POA: Diagnosis not present

## 2018-04-17 DIAGNOSIS — Z886 Allergy status to analgesic agent status: Secondary | ICD-10-CM | POA: Diagnosis not present

## 2018-04-17 DIAGNOSIS — Z96641 Presence of right artificial hip joint: Secondary | ICD-10-CM | POA: Insufficient documentation

## 2018-04-17 DIAGNOSIS — R296 Repeated falls: Secondary | ICD-10-CM | POA: Insufficient documentation

## 2018-04-17 DIAGNOSIS — N179 Acute kidney failure, unspecified: Secondary | ICD-10-CM | POA: Diagnosis not present

## 2018-04-17 DIAGNOSIS — H409 Unspecified glaucoma: Secondary | ICD-10-CM | POA: Diagnosis not present

## 2018-04-17 DIAGNOSIS — L03115 Cellulitis of right lower limb: Secondary | ICD-10-CM | POA: Diagnosis not present

## 2018-04-17 DIAGNOSIS — Z791 Long term (current) use of non-steroidal anti-inflammatories (NSAID): Secondary | ICD-10-CM | POA: Diagnosis not present

## 2018-04-17 DIAGNOSIS — G9341 Metabolic encephalopathy: Secondary | ICD-10-CM | POA: Diagnosis not present

## 2018-04-17 DIAGNOSIS — Z7952 Long term (current) use of systemic steroids: Secondary | ICD-10-CM | POA: Diagnosis not present

## 2018-04-17 DIAGNOSIS — R7303 Prediabetes: Secondary | ICD-10-CM | POA: Insufficient documentation

## 2018-04-17 DIAGNOSIS — Z86010 Personal history of colonic polyps: Secondary | ICD-10-CM | POA: Diagnosis not present

## 2018-04-17 DIAGNOSIS — I10 Essential (primary) hypertension: Secondary | ICD-10-CM | POA: Diagnosis not present

## 2018-04-17 DIAGNOSIS — R4182 Altered mental status, unspecified: Secondary | ICD-10-CM

## 2018-04-17 DIAGNOSIS — E860 Dehydration: Secondary | ICD-10-CM | POA: Diagnosis not present

## 2018-04-17 DIAGNOSIS — Z79899 Other long term (current) drug therapy: Secondary | ICD-10-CM | POA: Insufficient documentation

## 2018-04-17 DIAGNOSIS — Z7951 Long term (current) use of inhaled steroids: Secondary | ICD-10-CM | POA: Diagnosis not present

## 2018-04-17 DIAGNOSIS — E875 Hyperkalemia: Secondary | ICD-10-CM | POA: Diagnosis not present

## 2018-04-17 HISTORY — DX: Acute kidney failure, unspecified: N17.9

## 2018-04-17 HISTORY — DX: Altered mental status, unspecified: R41.82

## 2018-04-17 HISTORY — DX: Essential (primary) hypertension: I10

## 2018-04-17 LAB — CBC
HCT: 41.7 % (ref 39.0–52.0)
Hemoglobin: 13.2 g/dL (ref 13.0–17.0)
MCH: 32 pg (ref 26.0–34.0)
MCHC: 31.7 g/dL (ref 30.0–36.0)
MCV: 101.2 fL — ABNORMAL HIGH (ref 78.0–100.0)
Platelets: 222 10*3/uL (ref 150–400)
RBC: 4.12 MIL/uL — AB (ref 4.22–5.81)
RDW: 14.7 % (ref 11.5–15.5)
WBC: 9.7 10*3/uL (ref 4.0–10.5)

## 2018-04-17 LAB — RAPID URINE DRUG SCREEN, HOSP PERFORMED
Amphetamines: NOT DETECTED
BENZODIAZEPINES: NOT DETECTED
Cocaine: NOT DETECTED
Opiates: POSITIVE — AB
TETRAHYDROCANNABINOL: NOT DETECTED

## 2018-04-17 LAB — COMPREHENSIVE METABOLIC PANEL
ALBUMIN: 3.9 g/dL (ref 3.5–5.0)
ALT: 15 U/L — ABNORMAL LOW (ref 17–63)
ANION GAP: 6 (ref 5–15)
AST: 36 U/L (ref 15–41)
Alkaline Phosphatase: 86 U/L (ref 38–126)
BILIRUBIN TOTAL: 0.5 mg/dL (ref 0.3–1.2)
BUN: 23 mg/dL — ABNORMAL HIGH (ref 6–20)
CHLORIDE: 112 mmol/L — AB (ref 101–111)
CO2: 23 mmol/L (ref 22–32)
Calcium: 9.1 mg/dL (ref 8.9–10.3)
Creatinine, Ser: 1.74 mg/dL — ABNORMAL HIGH (ref 0.61–1.24)
GFR calc Af Amer: 41 mL/min — ABNORMAL LOW (ref >60)
GFR calc non Af Amer: 35 mL/min — ABNORMAL LOW (ref >60)
Glucose, Bld: 114 mg/dL — ABNORMAL HIGH (ref 65–99)
POTASSIUM: 5.1 mmol/L (ref 3.5–5.1)
Sodium: 141 mmol/L (ref 135–145)
Total Protein: 7.2 g/dL (ref 6.5–8.1)

## 2018-04-17 LAB — DIFFERENTIAL
Basophils Absolute: 0 10*3/uL (ref 0.0–0.1)
Basophils Relative: 0 %
EOS ABS: 0.4 10*3/uL (ref 0.0–0.7)
EOS PCT: 4 %
Lymphocytes Relative: 10 %
Lymphs Abs: 1 10*3/uL (ref 0.7–4.0)
Monocytes Absolute: 0.9 10*3/uL (ref 0.1–1.0)
Monocytes Relative: 9 %
Neutro Abs: 7.4 10*3/uL (ref 1.7–7.7)
Neutrophils Relative %: 77 %

## 2018-04-17 LAB — URINALYSIS, ROUTINE W REFLEX MICROSCOPIC
BILIRUBIN URINE: NEGATIVE
Bacteria, UA: NONE SEEN
Glucose, UA: NEGATIVE mg/dL
Hgb urine dipstick: NEGATIVE
KETONES UR: NEGATIVE mg/dL
NITRITE: NEGATIVE
Protein, ur: NEGATIVE mg/dL
SPECIFIC GRAVITY, URINE: 1.017 (ref 1.005–1.030)
pH: 6 (ref 5.0–8.0)

## 2018-04-17 LAB — PROTIME-INR
INR: 1.06
PROTHROMBIN TIME: 13.7 s (ref 11.4–15.2)

## 2018-04-17 LAB — ETHANOL: Alcohol, Ethyl (B): <10 mg/dL (ref <10)

## 2018-04-17 LAB — APTT: aPTT: 30 seconds (ref 24–36)

## 2018-04-17 MED ORDER — ACETAMINOPHEN 325 MG PO TABS
650.0000 mg | ORAL_TABLET | Freq: Four times a day (QID) | ORAL | Status: DC | PRN
  Administered 2018-04-17: 650 mg via ORAL
  Filled 2018-04-17: qty 2

## 2018-04-17 MED ORDER — ENOXAPARIN SODIUM 40 MG/0.4ML ~~LOC~~ SOLN
40.0000 mg | SUBCUTANEOUS | Status: DC
  Administered 2018-04-17: 40 mg via SUBCUTANEOUS
  Filled 2018-04-17: qty 0.4

## 2018-04-17 MED ORDER — SODIUM CHLORIDE 0.45 % IV SOLN
INTRAVENOUS | Status: AC
  Administered 2018-04-17 – 2018-04-18 (×2): via INTRAVENOUS

## 2018-04-17 NOTE — ED Notes (Signed)
Istat trop = 0.01.

## 2018-04-17 NOTE — ED Provider Notes (Signed)
Emergency Department Provider Note   I have reviewed the triage vital signs and the nursing notes.   HISTORY  Chief Complaint Altered Mental Status   HPI Nathaniel [name] is a 83 y.o. male with PMH of hip replacement, glaucoma of the left eye, HTN, and arthritis resents to the emergency department for evaluation of worsening confusion and generalized weakness with frequent falls.  Wife states that patient was treated for right leg swelling and redness after visiting urgent care last Saturday.  He was started on an antibiotic which he has now completed.  Right leg symptoms have improved but over the last week he has become more weak and fatigued.  No specific unilateral symptoms observed.  Over the past 48 hours the patient has become more confused.  He cannot tell his wife where he is or who the president is, which is very unusual.  She states she did have a fall yesterday that was unwitnessed but no obvious head trauma.  No vomiting or diarrhea.  No prior history of stroke.  Level 5 caveat: Altered Mental Status.   Past Medical History:  Diagnosis Date  . Arthritis   . Blood transfusion 2005   s/p hip replacement  . Colon polyps   . Environmental allergies    pollen , leaves, grass  . Erectile dysfunction   . Glaucoma of left eye    legally blind in left eye  . Hypertension     Patient Active Problem List   Diagnosis Date Noted  . Dizziness   . Leg weakness 04/19/2015  . Osteoarthritis of right knee 12/19/2011    Past Surgical History:  Procedure Laterality Date  . CATARACT EXTRACTION     bilateral  . EYE SURGERY    . GLAUCOMA VALVE INSERTION  2010,2011   Left eye  . HIP ARTHROPLASTY  2005   Right Hip replacement x 3  . JOINT REPLACEMENT    . MULTIPLE TOOTH EXTRACTIONS  2012   had 23 teeth pulled  . TOENAIL EXCISION    . TOTAL KNEE ARTHROPLASTY  12/15/2011   Procedure: TOTAL KNEE ARTHROPLASTY;  Surgeon: Kerin Salen, MD;  Location: [facility];  Service: Orthopedics;   Laterality: Right;  DEPUY SIGMA     Current Outpatient Rx  . Order #: [id number] Class: Historical Med  . Order #: [id number] Class: Historical Med  . Order #: [id number] Class: Historical Med  . Order #: [id number] Class: Historical Med  . Order #: [id number] Class: Print  . Order #: [id number] Class: Historical Med  . Order #: [id number] Class: Historical Med  . Order #: [id number] Class: Historical Med  . Order #: [id number] Class: Historical Med  . Order #: [id number] Class: Historical Med  . Order #: [id number] Class: Historical Med  . Order #: [id number] Class: Historical Med  . Order #: [id number] Class: Historical Med  . Order #: [id number] Class: Historical Med  . Order #: [id number] Class: Historical Med  . Order #: [id number] Class: Historical Med  . Order #: [id number] Class: Historical Med  . Order #: [id number] Class: Historical Med  . Order #: [id number] Class: Historical Med    Allergies Ibuprofen  History reviewed. No pertinent family history.  Social History Social History   Tobacco Use  . Smoking status: Former Smoker    Packs/day: 0.25    Years: 40.00    Pack years: 10.00    Types: Cigarettes    Last attempt to quit: 12/07/1998    Years since quitting: 19.3  . Smokeless tobacco: Former Systems developer    Types: Chew  Substance Use Topics  .  Alcohol use: Yes    Comment: rare  . Drug use: No    Review of Systems  Level 5 caveat: AMS  ____________________________________________   PHYSICAL EXAM:  VITAL SIGNS: ED Triage Vitals  Enc Vitals Group     BP 04/17/18 1151 (!) 148/81     Pulse Rate 04/17/18 1151 93     Resp 04/17/18 1151 18     Temp 04/17/18 1151 98.4 F (36.9 C)     Temp Source 04/17/18 1151 Oral     SpO2 04/17/18 1142 95 %     Pain Score 04/17/18 1157 4   Constitutional: Alert but oriented to self only. No acute distress.  Eyes: Conjunctivae are normal. Opacified left corea (baseline) with left gaze deviation (baseline per wife). Pupil not reactive on the left but normal on  the right.  Head: Atraumatic. Nose: No congestion/rhinnorhea. Mouth/Throat: Mucous membranes are moist.  Neck: No stridor.  Cardiovascular: Normal rate, regular rhythm. Good peripheral circulation. Grossly normal heart sounds.   Respiratory: Normal respiratory effort.  No retractions. Lungs CTAB. Gastrointestinal: Soft and nontender. No distention.  Musculoskeletal: No lower extremity tenderness nor edema. No gross deformities of extremities. Neurologic: Patient is confused on exam. Has difficulty participating the instructions of the exam. No clear unilateral weakness or numbness but exam more consistent with global encephalopathy.  Skin:  Skin is warm, dry and intact. No rash noted.  ____________________________________________   LABS (all labs ordered are listed, but only abnormal results are displayed)  Labs Reviewed  CBC - Abnormal; Notable for the following components:      Result Value   RBC 4.12 (*)    MCV 101.2 (*)    All other components within normal limits  COMPREHENSIVE METABOLIC PANEL - Abnormal; Notable for the following components:   Chloride 112 (*)    Glucose, Bld 114 (*)    BUN 23 (*)    Creatinine, Ser 1.74 (*)    ALT 15 (*)    GFR calc non Af Amer 35 (*)    GFR calc Af Amer 41 (*)    All other components within normal limits  RAPID URINE DRUG SCREEN, HOSP PERFORMED - Abnormal; Notable for the following components:   Opiates POSITIVE (*)    Barbiturates   (*)    Value: Result not available. Reagent lot number recalled by manufacturer.   All other components within normal limits  URINALYSIS, ROUTINE W REFLEX MICROSCOPIC - Abnormal; Notable for the following components:   Leukocytes, UA TRACE (*)    All other components within normal limits  ETHANOL  PROTIME-INR  APTT  DIFFERENTIAL  I-STAT TROPONIN, ED   ____________________________________________  EKG   EKG Interpretation  Date/Time:  Saturday April 17 2018 11:51:20 EDT Ventricular Rate:  94 PR  Interval:    QRS Duration: 101 QT Interval:  364 QTC Calculation: 456 R Axis:   63 Text Interpretation:  Sinus rhythm No STEMI  Confirmed by Nanda Quinton [phone]) on 04/17/2018 12:20:52 PM       ____________________________________________  RADIOLOGY  Dg Chest 2 View  Result Date: 04/17/2018 CLINICAL DATA:  Chest pain EXAM: CHEST - 2 VIEW COMPARISON:  04/19/2015 FINDINGS: Low lung volumes. Heart and mediastinal contours are within normal limits. No focal opacities or effusions. No acute bony abnormality. IMPRESSION: Low volumes.  No active cardiopulmonary disease. Electronically Signed   By: Rolm Baptise M.D.   On: 04/17/2018 12:37   Ct Head Wo Contrast  Result Date: 04/17/2018 CLINICAL DATA:  Altered level of consciousness EXAM: CT HEAD WITHOUT CONTRAST TECHNIQUE: Contiguous axial images were obtained from the base of the skull through the vertex without intravenous contrast. COMPARISON:  Is 04/19/2015 FINDINGS: Brain: No acute intracranial abnormality. Specifically, no hemorrhage, hydrocephalus, mass lesion, acute infarction, or significant intracranial injury. Age related volume loss. Vascular: No hyperdense vessel or unexpected calcification. Skull: No acute calvarial abnormality. Sinuses/Orbits: Visualized paranasal sinuses and mastoids clear. Orbital soft tissues unremarkable. Postoperative changes in the left orbit. Other: None IMPRESSION: No acute intracranial abnormality. Electronically Signed   By: Rolm Baptise M.D.   On: 04/17/2018 12:28   Mr Brain Wo Contrast  Result Date: 04/17/2018 CLINICAL DATA:  83 y/o M; seen at urgent care 1 week ago for right leg swelling. Patient lethargic. Altered level of consciousness (LOC), unexplained. EXAM: MRI HEAD WITHOUT CONTRAST TECHNIQUE: Multiplanar, multiecho pulse sequences of the brain and surrounding structures were obtained without intravenous contrast. COMPARISON:  04/17/2018 CT head.  04/20/2015 MRI head. FINDINGS: Brain: Small chronic  infarction within the left inferior cerebellar hemisphere, new from 2016. Interval subcentimeter chronic infarction in the left parietal cortex. Stable small chronic left frontal white matter infarction. Stable mild chronic microvascular ischemic changes of white matter and brain parenchymal volume loss. No reduced diffusion to suggest acute or early subacute infarction. No abnormal susceptibility hypointensity indicate intracranial hemorrhage. No hydrocephalus, extra-axial collection, focal mass effect, or herniation. Vascular: Normal flow voids. Skull and upper cervical spine: Normal marrow signal. Sinuses/Orbits: Negative. Other: Bilateral intra-ocular lens replacement. IMPRESSION: 1. No acute intracranial abnormality identified. 2. Interval small chronic infarctions within left inferior cerebellar hemisphere and left parietal cortex from 2016. 3. Stable mild chronic microvascular ischemic changes and parenchymal volume loss of the brain for age. Electronically Signed   By: Kristine Garbe M.D.   On: 04/17/2018 14:58    ____________________________________________   PROCEDURES  Procedure(s) performed:   Procedures  None ____________________________________________   INITIAL IMPRESSION / ASSESSMENT AND PLAN / ED COURSE  Pertinent labs & imaging results that were available during my care of the patient were reviewed by me and considered in my medical decision making (see chart for details).  Patient presents to the emergency department for evaluation of confusion and more global encephalopathy picture.  Wife reports generalized fatigue over the last week with confusion developing over the last 48 hours.  Patient does not have any focal neurological deficits on my exam.  His left eye is abnormal but appears to be at baseline according to the wife.  The patient is outside of any stroke intervention window.  Only new medication is an antibiotic which the patient has completed for presumed  right lower extremity cellulitis.  No cellulitis or edema on the right lower extremity.  And for CT imaging of the head with recent fall along with labs, UA, reassess after IV fluids.  CT imaging reviewed. Labs show a slight AKI. Normal UA. MRI ordered but no focal deficits.   Discussed patient's case with Hospitalist, Dr. Zigmund Daniel to request admission. Patient and family (if present) updated with plan. Care transferred to Hospitalist service.  I reviewed all nursing notes, vitals, pertinent old records, EKGs, labs, imaging (as available).  03:12 PM MRI negative for acute process.   ____________________________________________  FINAL CLINICAL IMPRESSION(S) / ED DIAGNOSES  Final diagnoses:  Disorientation  Generalized weakness    Note:  This document was prepared using Dragon voice recognition software and may include unintentional dictation errors.  Nanda Quinton, MD Emergency Medicine  Margette Fast, MD 04/17/18 [phone]

## 2018-04-17 NOTE — ED Notes (Signed)
Bed: WA07 Expected date:  Expected time:  Means of arrival:  Comments: 83 yo AMS

## 2018-04-17 NOTE — Progress Notes (Signed)
New Admission Note:    Arrival Method: STretcer from ER Mental Orientation: Sleepy Assessment:In progress Skin: In progress IV:  18G [place] Pain: N/A Safety Measures: Side rails up x 2 Admission: In progress 5E Orientation: Completed Family: At bedside   Orders have been reviewed and implemented. Pt placed on Tele. Will continue to monitor the patient.

## 2018-04-17 NOTE — ED Triage Notes (Signed)
Pt BIB [facility] from home for AMS. Pt was seen at urgent care x1 wk ago for rt leg swelling. Pt normally walks with a cane. Pt was given a sulfa medication. Since then pt has had a decline in health. Pain has moved from rt leg to left hip to back. No known urinary symptoms, limited movement and limited intake over last few days. Pt lethargic and unable to answer most questions. 12 lead unremarkable. Equal movement and strength in each side per EMS.

## 2018-04-17 NOTE — H&P (Signed)
History and Physical    Nathaniel [name]:[id number] DOB: 02-17-1935 DOA: 04/17/2018  PCP: Shirline Frees, MD Patient coming from: patient lives at home with his wife  Chief Complaint: Altered mental status  HPI: Nathaniel [name] is a 83 y.o. 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  Patient patient's wife reports that he had a fall yesterday in the garage while she was out cleaning the yard.  Patient tries to walk and is unstable on his feet.  She denied any fever or chills urinary urgency frequency or hematuria denied any complaints of headache from the patient.  Or changes with his vision.  He is moving all extremities.  No nausea vomiting diarrhea constipation reported.  Patient has been taking Bactrim for the last 6 days for right lower extremity cellulitis which was prescribed by urgent care.  Patient also takes indomethacin for arthritis.    ED Course: Signs were 149 101 pulse was 79 respiration 14 temperature 98.4 100% on room air, his labs included sodium 141 potassium 5.1 BUN 23 creatinine 1.74 liver function test where normal WBC count 9.7 platelet count 222 hemoglobin 13.2.  Trace amount of leukocytes urine drug screen was positive for opiates CT scan of the head showed no acute abnormality the brain showed no acute intracranial abnormality, interval chronic small infarctions within the left inferior cerebellar hemisphere and left parietal cortex, stable mild chronic microvascular ischemic changes and parenchymal volume loss of brain for age.  3 no active cardiopulmonary disease.  Review of Systems see history of presenting illness Ambulatory Status: At baseline he is ambulates inside the house he does not walk a lot outside due to arthritis and left eye  blindness.  Past Medical History:  Diagnosis Date  . Arthritis   . Blood transfusion 2005   s/p hip replacement  . Colon polyps   . Environmental allergies    pollen , leaves, grass  . Erectile dysfunction   . Glaucoma of left eye    legally blind in left eye  . Hypertension     Past Surgical History:  Procedure Laterality Date  . CATARACT EXTRACTION     bilateral  . EYE SURGERY    . GLAUCOMA VALVE INSERTION  2010,2011   Left eye  . HIP ARTHROPLASTY  2005   Right Hip replacement x 3  . JOINT REPLACEMENT    . MULTIPLE TOOTH EXTRACTIONS  2012   had 23 teeth pulled  . TOENAIL EXCISION    . TOTAL KNEE ARTHROPLASTY  12/15/2011   Procedure: TOTAL KNEE ARTHROPLASTY;  Surgeon: Kerin Salen, MD;  Location: [facility];  Service: Orthopedics;  Laterality: Right;  DEPUY SIGMA     Social History   Socioeconomic History  . Marital status: Married    Spouse name: Not on file  . Number of children: Not on file  . Years of education: Not on file  . Highest education level: Not on file  Occupational History  . Not on file  Social Needs  . Financial resource strain: Not on file  . Food insecurity:    Worry: Not on file    Inability: Not on file  . Transportation needs:    Medical: Not on file    Non-medical: Not on file  Tobacco Use  . Smoking status: Former Smoker  Packs/day: 0.25    Years: 40.00    Pack years: 10.00    Types: Cigarettes    Last attempt to quit: 12/07/1998    Years since quitting: 19.3  . Smokeless tobacco: Former Systems developer    Types: Chew  Substance and Sexual Activity  . Alcohol use: Yes    Comment: rare  . Drug use: No  . Sexual activity: Not on file  Lifestyle  . Physical activity:    Days per week: Not on file    Minutes per session: Not on file  . Stress: Not on file  Relationships  . Social connections:    Talks on phone: Not on file    Gets together: Not on file    Attends religious service: Not on file    Active member of club or organization:  Not on file    Attends meetings of clubs or organizations: Not on file    Relationship status: Not on file  . Intimate partner violence:    Fear of current or ex partner: Not on file    Emotionally abused: Not on file    Physically abused: Not on file    Forced sexual activity: Not on file  Other Topics Concern  . Not on file  Social History Narrative  . Not on file    Allergies  Allergen Reactions  . Ibuprofen Hives    Conflict with other meds    History reviewed. No pertinent family history.   Prior to Admission medications   Medication Sig Start Date End Date Taking? Authorizing Provider  amitriptyline (ELAVIL) 10 MG tablet Take 10 mg by mouth daily. 03/31/18  Yes [provider]  baclofen (LIORESAL) 10 MG tablet Take 10 mg by mouth 4 (four) times daily.   Yes [provider]  brimonidine (ALPHAGAN) 0.15 % ophthalmic solution Place 1 drop into both eyes 2 (two) times daily.    Yes [provider]  cetirizine (ZYRTEC) 10 MG tablet Take 10 mg by mouth daily.   Yes [provider]  clopidogrel (PLAVIX) 75 MG tablet Take 1 tablet (75 mg total) by mouth daily. 04/21/15  Yes Eulogio Bear U, DO  diclofenac sodium (VOLTAREN) 1 % GEL Apply 1 application topically 4 (four) times daily as needed (pain).  04/15/18  Yes [provider]  dorzolamide-timolol (COSOPT) 22.3-6.8 MG/ML ophthalmic solution Place 1 drop into both eyes 2 (two) times daily.   Yes [provider]  fluticasone (CUTIVATE) 0.05 % cream Apply 1 application topically 2 (two) times daily as needed (rash).  04/15/18  Yes [provider]  folic acid (FOLVITE) 1 MG tablet Take 1 mg by mouth at bedtime.    Yes [provider]  gabapentin (NEURONTIN) 800 MG tablet Take 800 mg by mouth 2 (two) times daily.    Yes [provider]  HYDROcodone-acetaminophen (NORCO) 10-325 MG per tablet Take 1 tablet by mouth every 4 (four) hours as needed. 03/09/15  Yes  [provider]  indomethacin (INDOCIN SR) 75 MG CR capsule Take 75 mg by mouth 2 (two) times daily with a meal.   Yes [provider]  ketoconazole (NIZORAL) 2 % cream Apply 1 application topically 2 (two) times daily as needed for rash. 03/04/18  Yes [provider]  lisinopril (PRINIVIL,ZESTRIL) 20 MG tablet Take 20 mg by mouth at bedtime.    Yes [provider]  methotrexate (RHEUMATREX) 2.5 MG tablet Take 15 mg by mouth once a week.  Yes [provider]  predniSONE (DELTASONE) 5 MG tablet Take 5 mg by mouth daily. 03/20/18  Yes [provider]  sulfamethoxazole-trimethoprim (BACTRIM DS,SEPTRA DS) 800-160 MG tablet Take 1 tablet by mouth 2 (two) times daily. 04/09/18  Yes [provider]  Travoprost, BAK Free, (TRAVATAN) 0.004 % SOLN ophthalmic solution Place 1 drop into both eyes at bedtime.   Yes [provider]  Vitamin Mixture (VITAMIN E COMPLETE) CAPS Take 1 capsule by mouth daily as needed (for supplementation).   Yes [provider]    Physical Exam: Patient resting in bed in no acute distress he is awake he is oriented to place, he he told me that Trump is the president of this country, was able to recognize his wife and his/her name.  His wife reported that he is better than this morning. Vitals:   04/17/18 1151 04/17/18 1156 04/17/18 1521 04/17/18 1556  BP: (!) 148/81  (!) 152/87 (!) 149/101  Pulse: 93  88 79  Resp: 18  20 14   Temp: 98.4 F (36.9 C)     TempSrc: Oral     SpO2: 93% 96% 91% 100%     General:  Appears calm and comfortable Eyes: PERRL, EOMI, normal lids, iris left eye glaucoma ENT:  grossly normal hearing, lips & tongue, mmm Neck: Supple  cular: RRR, no m/r/g. No LE edema.  Respiratory: CTA bilaterally, no w/r/r. Normal respiratory effort. Abdomen:  soft, ntnd, NABS Skin:  no rash or induration seen on limited exam Musculoskeletal: Warm to the right lower extremity compared to the  left lower extremity noted no tenderness. Psychiatric:  grossly normal mood and affect, speech fluent and appropriate, AOx3 Neurologic: CN 2-12 grossly intact, moves all extremities in coordinated fashion, sensation intact  Labs on Admission: I have personally reviewed following labs and imaging studies  CBC: Recent Labs  Lab 04/17/18 1253  WBC 9.7  NEUTROABS 7.4  HGB 13.2  HCT 41.7  MCV 101.2*  PLT 315   Basic Metabolic Panel: Recent Labs  Lab 04/17/18 1253  NA 141  K 5.1  CL 112*  CO2 23  GLUCOSE 114*  BUN 23*  CREATININE 1.74*  CALCIUM 9.1   GFR: CrCl cannot be calculated (Unknown ideal weight.). Liver Function Tests: Recent Labs  Lab 04/17/18 1253  AST 36  ALT 15*  ALKPHOS 86  BILITOT 0.5  PROT 7.2  ALBUMIN 3.9   No results for input(s): LIPASE, AMYLASE in the last 168 hours. No results for input(s): AMMONIA in the last 168 hours. Coagulation Profile: Recent Labs  Lab 04/17/18 1253  INR 1.06   Cardiac Enzymes: No results for input(s): CKTOTAL, CKMB, CKMBINDEX, TROPONINI in the last 168 hours. BNP (last 3 results) No results for input(s): PROBNP in the last 8760 hours. HbA1C: No results for input(s): HGBA1C in the last 72 hours. CBG: No results for input(s): GLUCAP in the last 168 hours. Lipid Profile: No results for input(s): CHOL, HDL, LDLCALC, TRIG, CHOLHDL, LDLDIRECT in the last 72 hours. Thyroid Function Tests: No results for input(s): TSH, T4TOTAL, FREET4, T3FREE, THYROIDAB in the last 72 hours. Anemia Panel: No results for input(s): VITAMINB12, FOLATE, FERRITIN, TIBC, IRON, RETICCTPCT in the last 72 hours. Urine analysis:    Component Value Date/Time   COLORURINE YELLOW 04/17/2018 1202   APPEARANCEUR CLEAR 04/17/2018 1202   LABSPEC 1.017 04/17/2018 1202   PHURINE 6.0 04/17/2018 1202   GLUCOSEU NEGATIVE 04/17/2018 1202   HGBUR NEGATIVE 04/17/2018 1202   BILIRUBINUR NEGATIVE 04/17/2018  [place] 04/17/2018 [place] 04/17/2018 1202   UROBILINOGEN 0.2 04/19/2015 1010   NITRITE NEGATIVE 04/17/2018 1202   LEUKOCYTESUR TRACE (A) 04/17/2018 1202    Creatinine Clearance: CrCl cannot be calculated (Unknown ideal weight.).  Sepsis Labs: @LABRCNTIP (procalcitonin:4,lacticidven:4) )No results found for this or any previous visit (from the past 240 hour(s)).   Radiological Exams on Admission: Dg Chest 2 View  Result Date: 04/17/2018 CLINICAL DATA:  Chest pain EXAM: CHEST - 2 VIEW COMPARISON:  04/19/2015 FINDINGS: Low lung volumes. Heart and mediastinal contours are within normal limits. No focal opacities or effusions. No acute bony abnormality. IMPRESSION: Low volumes.  No active cardiopulmonary disease. Electronically Signed   By: Rolm Baptise M.D.   On: 04/17/2018 12:37   Ct Head Wo Contrast  Result Date: 04/17/2018 CLINICAL DATA:  Altered level of consciousness EXAM: CT HEAD WITHOUT CONTRAST TECHNIQUE: Contiguous axial images were obtained from the base of the skull through the vertex without intravenous contrast. COMPARISON:  Is 04/19/2015 FINDINGS: Brain: No acute intracranial abnormality. Specifically, no hemorrhage, hydrocephalus, mass lesion, acute infarction, or significant intracranial injury. Age related volume loss. Vascular: No hyperdense vessel or unexpected calcification. Skull: No acute calvarial abnormality. Sinuses/Orbits: Visualized paranasal sinuses and mastoids clear. Orbital soft tissues unremarkable. Postoperative changes in the left orbit. Other: None IMPRESSION: No acute intracranial abnormality. Electronically Signed   By: Rolm Baptise M.D.   On: 04/17/2018 12:28   Mr Brain Wo Contrast  Result Date: 04/17/2018 CLINICAL DATA:  83 y/o M; seen at urgent care 1 week ago for right leg swelling. Patient lethargic. Altered level of consciousness (LOC), unexplained. EXAM: MRI HEAD WITHOUT CONTRAST TECHNIQUE: Multiplanar, multiecho pulse sequences of the brain and surrounding  structures were obtained without intravenous contrast. COMPARISON:  04/17/2018 CT head.  04/20/2015 MRI head. 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 Interval subcentimeter chronic infarction in the left parietal cortex. Stable small chronic left frontal white matter infarction. Stable mild chronic microvascular ischemic changes of white matter and brain parenchymal volume loss. No reduced diffusion to suggest acute or early subacute infarction. No abnormal susceptibility hypointensity indicate intracranial hemorrhage. No hydrocephalus, extra-axial collection, focal mass effect, or herniation. Vascular: Normal flow voids. Skull and upper cervical spine: Normal marrow signal. Sinuses/Orbits: Negative. Other: Bilateral intra-ocular lens replacement. IMPRESSION: 1. No acute intracranial abnormality identified. 2. Interval small chronic infarctions within left inferior cerebellar hemisphere and left parietal cortex from 2016. 3. Stable mild chronic microvascular ischemic changes and parenchymal volume loss of the brain for age. Electronically Signed   By: Kristine Garbe M.D.   On: 04/17/2018 14:58    EKG: Independently reviewed.   Assessment/Plan Active Problems:   * No active hospital problems. *   1] altered mental status/encephalopathy-wife brought the patient in due to increasing confusion increasing the number of falls.  I cannot pinpoint at this time as to what caused his mental status to alter.  There is no evidence of infection so far that have found.  The only new medicine that was started was Bactrim for his right lower extremity cellulitis.  Will monitor him overnight hopefully he will improve and get back to baseline.  Patient has already made improvement when I saw him compared to this morning.  He was able to tell me where he is and who the president of this country is and he did have a frown on his face when he said who  the president of this country is which was new since morning.  There is no evidence of acute stroke by MRI or CT scan.  I will add a B12 TSH folate and RPR for tomorrow.  I will also place PT OT evaluation .  2]  arthritis pain patient takes baclofen, amitriptyline 10 mg daily, gabapentin, Norco, Indocin, methotrexate, prednisone.  Wife is very sure that he did not take more than his usual prescribed dose.  However if he was confused he probably would have taken more than what he normally takes.  I will hold all of the above medications and monitor him.  3] recent light right lower extremity cellulitis treated with Bactrim for 6 days his right lower extremity is to mildly swollen and warm to touch compared to his left leg.  Monitor closely I am not starting him on another antibiotic or continuing his Bactrim at this time.  His WBC count is normal he is afebrile in the ER.  4] AKI slightly increased creatinine compared to his normal.  His creatinine was 0.8 04/19/2015 and today is 1.74.  We will give him slow hydration and follow-up labs tomorrow.       DVT prophylaxis: Lovenox Code Status: Full code Family Communication: Wife was in the room throughout the time I was talking to the patient. Disposition Plan: TBD consults called: None Admission status: Observation   Georgette Shell MD [facility]  If 7PM-7AM, please contact night-coverage [email or web address] Password [facility]  04/17/2018, 3:58 PM

## 2018-04-17 NOTE — ED Notes (Signed)
ED TO INPATIENT HANDOFF REPORT  Name/Age/Gender Nathaniel [name] 83 y.o. male  Code Status    Code Status Orders  (From admission, onward)        Start     Ordered   04/17/18 1657  Full code  Continuous     04/17/18 1656    Code Status History    Date Active Date Inactive Code Status Order ID Comments User Context   04/19/2015 1536 04/21/2015 1818 Full Code [id number]  Oswald Hillock, MD Inpatient   12/15/2011 1534 12/19/2011 1848 Full Code [id number]  Cyndra Numbers, RN Inpatient    Advance Directive Documentation     Most Recent Value  Type of Advance Directive  Healthcare Power of Attorney  Pre-existing out of facility DNR order (yellow form or pink MOST form)  -  "MOST" Form in Place?  -      Home/SNF/Other Home  Chief Complaint alt mental status  Level of Care/Admitting Diagnosis ED Disposition    ED Disposition Condition [place]: [place] [[id number]]  Level of Care: Med-Surg [16]  Diagnosis: Altered mental status [780.97.ICD-9-CM]  Admitting Physician: Georgette Shell [[id number]]  Attending Physician: Georgette Shell [[id number]]  PT Class (Do Not Modify): Observation [104]  PT Acc Code (Do Not Modify): Observation [10022]       Medical History Past Medical History:  Diagnosis Date  . AKI (acute kidney injury) ([place]) 04/17/2018  . Altered mental status 04/17/2018  . Arthritis   . Blood transfusion 2005   s/p hip replacement  . Colon polyps   . Environmental allergies    pollen , leaves, grass  . Erectile dysfunction   . Glaucoma of left eye    legally blind in left eye  . Hypertension     Allergies Allergies  Allergen Reactions  . Ibuprofen Hives    Conflict with other meds    IV Location/Drains/Wounds Patient Lines/Drains/Airways Status   Active Line/Drains/Airways    Name:   Placement date:   Placement time:   Site:   Days:   Peripheral IV 04/19/15 Right Antecubital   04/19/15    1001     Antecubital   1094   Peripheral IV 04/17/18 Left Wrist   04/17/18    1142    Wrist   less than 1          Labs/Imaging Results for orders placed or performed during the hospital encounter of 04/17/18 (from the past 48 hour(s))  Urine rapid drug screen (hosp performed)     Status: Abnormal   Collection Time: 04/17/18 12:02 PM  Result Value Ref Range   Opiates POSITIVE (A) NONE DETECTED   Cocaine NONE DETECTED NONE DETECTED   Benzodiazepines NONE DETECTED NONE DETECTED   Amphetamines NONE DETECTED NONE DETECTED   Tetrahydrocannabinol NONE DETECTED NONE DETECTED   Barbiturates (A) NONE DETECTED    Result not available. Reagent lot number recalled by manufacturer.    Comment: (NOTE) DRUG SCREEN FOR MEDICAL PURPOSES ONLY.  IF CONFIRMATION IS NEEDED FOR ANY PURPOSE, NOTIFY LAB WITHIN 5 DAYS. LOWEST DETECTABLE LIMITS FOR URINE DRUG SCREEN Drug Class                     Cutoff (ng/mL) Amphetamine and metabolites    1000 Barbiturate and metabolites    200 Benzodiazepine                 166 Tricyclics and metabolites  300 Opiates and metabolites        300 Cocaine and metabolites        300 THC                            50 Performed at [facility] [address], [place], [place] [address]   Urinalysis, Routine w reflex microscopic     Status: Abnormal   Collection Time: 04/17/18 12:02 PM  Result Value Ref Range   Color, Urine YELLOW YELLOW   APPearance CLEAR CLEAR   Specific Gravity, Urine 1.017 1.005 - 1.030   pH 6.0 5.0 - 8.0   Glucose, UA NEGATIVE NEGATIVE mg/dL   Hgb urine dipstick NEGATIVE NEGATIVE   Bilirubin Urine NEGATIVE NEGATIVE   Ketones, ur NEGATIVE NEGATIVE mg/dL   Protein, ur NEGATIVE NEGATIVE mg/dL   Nitrite NEGATIVE NEGATIVE   Leukocytes, UA TRACE (A) NEGATIVE   RBC / HPF 0-5 0 - 5 RBC/hpf   WBC, UA 0-5 0 - 5 WBC/hpf   Bacteria, UA NONE SEEN NONE SEEN   Mucus PRESENT    Hyaline Casts, UA PRESENT     Comment: Performed at [facility], [place] [address] [place], [place] [address]  Ethanol     Status: None   Collection Time: 04/17/18 12:53 PM  Result Value Ref Range   Alcohol, Ethyl (B) <10 <10 mg/dL    Comment: (NOTE) Lowest detectable limit for serum alcohol is 10 mg/dL. For medical purposes only. Performed at [facility], [place] [address], [place], [place] [address]   Protime-INR     Status: None   Collection Time: 04/17/18 12:53 PM  Result Value Ref Range   Prothrombin Time 13.7 11.4 - 15.2 seconds   INR 1.06     Comment: Performed at [facility], [place] [address] [place] [address], [place], [place] [address]  APTT     Status: None   Collection Time: 04/17/18 12:53 PM  Result Value Ref Range   aPTT 30 24 - 36 seconds    Comment: Performed at [facility], [place] [address] [place], [place] [address]  CBC     Status: Abnormal   Collection Time: 04/17/18 12:53 PM  Result Value Ref Range   WBC 9.7 4.0 - 10.5 K/uL   RBC 4.12 (L) 4.22 - 5.81 MIL/uL   Hemoglobin 13.2 13.0 - 17.0 g/dL   HCT 41.7 39.0 - 52.0 %   MCV 101.2 (H) 78.0 - 100.0 fL   MCH 32.0 26.0 - 34.0 pg   MCHC 31.7 30.0 - 36.0 g/dL   RDW 14.7 11.5 - 15.5 %   Platelets 222 150 - 400 K/uL    Comment: Performed at [facility], [place] [address] [place], [place] [address]  Differential     Status: None   Collection Time: 04/17/18 12:53 PM  Result Value Ref Range   Neutrophils Relative % 77 %   Neutro Abs 7.4 1.7 - 7.7 K/uL   Lymphocytes Relative 10 %   Lymphs Abs 1.0 0.7 - 4.0 K/uL   Monocytes Relative 9 %   Monocytes Absolute 0.9 0.1 - 1.0 K/uL   Eosinophils Relative 4 %   Eosinophils Absolute 0.4 0.0 - 0.7 K/uL   Basophils Relative 0 %   Basophils Absolute 0.0 0.0 - 0.1 K/uL    Comment: Performed at [facility], [place] [address], [place], [place] [address]  Comprehensive metabolic panel     Status: Abnormal   Collection Time: 04/17/18 12:53 PM   Result Value Ref Range   Sodium 141 135 - 145 mmol/L   Potassium 5.1 3.5 - 5.1 mmol/L   Chloride 112 (H) 101 - 111 mmol/L   CO2 23 22 - 32 mmol/L   Glucose, Bld 114 (H) 65 - 99 mg/dL   BUN 23 (H) 6 - 20 mg/dL   Creatinine, Ser 1.74 (H) 0.61 - 1.24 mg/dL   Calcium 9.1 8.9 - 10.3 mg/dL   Total Protein 7.2 6.5 - 8.1 g/dL   Albumin 3.9 3.5 - 5.0 g/dL   AST 36 15 - 41 U/L   ALT 15 (L) 17 - 63 U/L   Alkaline Phosphatase 86 38 - 126 U/L   Total Bilirubin 0.5 0.3 - 1.2 mg/dL   GFR calc non Af Amer 35 (L) >60 mL/min   GFR calc Af Amer 41 (L) >60 mL/min    Comment: (NOTE) The eGFR has been calculated using the CKD EPI equation. This calculation has not been validated in all clinical situations. eGFR's persistently <60 mL/min signify possible Chronic Kidney Disease.    Anion gap 6 5 - 15    Comment: Performed at [facility], [place] [address], [place], [place] [address]   Dg Chest 2 View  Result Date: 04/17/2018 CLINICAL DATA:  Chest pain EXAM: CHEST - 2 VIEW COMPARISON:  04/19/2015 FINDINGS: Low lung volumes. Heart and mediastinal contours are within normal limits. No focal opacities or effusions. No acute bony abnormality. IMPRESSION: Low volumes.  No active cardiopulmonary disease. Electronically Signed   By: Rolm Baptise M.D.   On: 04/17/2018 12:37   Ct Head Wo Contrast  Result Date: 04/17/2018 CLINICAL DATA:  Altered level of consciousness EXAM: CT HEAD WITHOUT CONTRAST TECHNIQUE: Contiguous axial images were obtained from the base of the skull through the vertex without intravenous contrast. COMPARISON:  Is 04/19/2015 FINDINGS: Brain: No acute intracranial abnormality. Specifically, no hemorrhage, hydrocephalus, mass lesion, acute infarction, or significant intracranial injury. Age related volume loss. Vascular: No hyperdense vessel or unexpected calcification. Skull: No acute calvarial abnormality. Sinuses/Orbits: Visualized paranasal sinuses and mastoids clear. Orbital  soft tissues unremarkable. Postoperative changes in the left orbit. Other: None IMPRESSION: No acute intracranial abnormality. Electronically Signed   By: Rolm Baptise M.D.   On: 04/17/2018 12:28   Mr Brain Wo Contrast  Result Date: 04/17/2018 CLINICAL DATA:  83 y/o M; seen at urgent care 1 week ago for right leg swelling. Patient lethargic. Altered level of consciousness (LOC), unexplained. EXAM: MRI HEAD WITHOUT CONTRAST TECHNIQUE: Multiplanar, multiecho pulse sequences of the brain and surrounding structures were obtained without intravenous contrast. COMPARISON:  04/17/2018 CT head.  04/20/2015 MRI head. 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 Interval subcentimeter chronic infarction in the left parietal cortex. Stable small chronic left frontal white matter infarction. Stable mild chronic microvascular ischemic changes of white matter and brain parenchymal volume loss. No reduced diffusion to suggest acute or early subacute infarction. No abnormal susceptibility hypointensity indicate intracranial hemorrhage. No hydrocephalus, extra-axial collection, focal mass effect, or herniation. Vascular: Normal flow voids. Skull and upper cervical spine: Normal marrow signal. Sinuses/Orbits: Negative. Other: Bilateral intra-ocular lens replacement. IMPRESSION: 1. No acute intracranial abnormality identified. 2. Interval small chronic infarctions within left inferior cerebellar hemisphere and left parietal cortex from 2016. 3. Stable mild chronic microvascular ischemic changes and parenchymal volume loss of the brain  for age. Electronically Signed   By: Kristine Garbe M.D.   On: 04/17/2018 14:58    Pending Labs Unresulted Labs (From admission, onward)   Start     Ordered   04/18/18 7035  Basic metabolic panel  Tomorrow morning,   R     04/17/18 1619   04/18/18 0500  TSH  Tomorrow morning,   R     04/17/18 1619   04/18/18 0500  RPR   Tomorrow morning,   R     04/17/18 1619   04/18/18 0500  Folate RBC  Tomorrow morning,   R     04/17/18 1619   04/18/18 0500  Vitamin B1  Tomorrow morning,   R     04/17/18 1619      Vitals/Pain Today's Vitals   04/17/18 1157 04/17/18 1521 04/17/18 1556 04/17/18 1637  BP:  (!) 152/87 (!) 149/101 126/83  Pulse:  88 79 64  Resp:  _0 Temp:      TempSrc:      SpO2:  91% 100% 99%  PainSc: 4        Isolation Precautions No active isolations  Medications Medications  0.45 % sodium chloride infusion ( Intravenous New Bag/Given 04/17/18 1634)  enoxaparin (LOVENOX) injection 40 mg (has no administration in time range)    Mobility walks with device

## 2018-04-17 NOTE — ED Notes (Signed)
Patient transported to MRI 

## 2018-04-18 DIAGNOSIS — W19XXXA Unspecified fall, initial encounter: Secondary | ICD-10-CM

## 2018-04-18 DIAGNOSIS — N179 Acute kidney failure, unspecified: Secondary | ICD-10-CM | POA: Diagnosis not present

## 2018-04-18 DIAGNOSIS — G9341 Metabolic encephalopathy: Secondary | ICD-10-CM | POA: Diagnosis not present

## 2018-04-18 DIAGNOSIS — Y92009 Unspecified place in unspecified non-institutional (private) residence as the place of occurrence of the external cause: Secondary | ICD-10-CM

## 2018-04-18 LAB — TSH: TSH: 2.761 u[IU]/mL (ref 0.350–4.500)

## 2018-04-18 LAB — BASIC METABOLIC PANEL
ANION GAP: 6 (ref 5–15)
BUN: 21 mg/dL — ABNORMAL HIGH (ref 6–20)
CALCIUM: 8.5 mg/dL — AB (ref 8.9–10.3)
CHLORIDE: 112 mmol/L — AB (ref 101–111)
CO2: 20 mmol/L — AB (ref 22–32)
Creatinine, Ser: 1.42 mg/dL — ABNORMAL HIGH (ref 0.61–1.24)
GFR calc Af Amer: 52 mL/min — ABNORMAL LOW (ref >60)
GFR calc non Af Amer: 45 mL/min — ABNORMAL LOW (ref >60)
GLUCOSE: 111 mg/dL — AB (ref 65–99)
POTASSIUM: 4.9 mmol/L (ref 3.5–5.1)
Sodium: 138 mmol/L (ref 135–145)

## 2018-04-18 MED ORDER — HYDROCODONE-ACETAMINOPHEN 10-325 MG PO TABS
1.0000 | ORAL_TABLET | ORAL | Status: DC | PRN
Start: 2018-04-18 — End: 2018-04-18

## 2018-04-18 MED ORDER — BACLOFEN 10 MG PO TABS
10.0000 mg | ORAL_TABLET | Freq: Four times a day (QID) | ORAL | Status: DC
  Administered 2018-04-18: 10 mg via ORAL
  Filled 2018-04-18: qty 1

## 2018-04-18 MED ORDER — DORZOLAMIDE HCL-TIMOLOL MAL 2-0.5 % OP SOLN
1.0000 [drp] | Freq: Two times a day (BID) | OPHTHALMIC | Status: DC
  Filled 2018-04-18: qty 10

## 2018-04-18 MED ORDER — CLOPIDOGREL BISULFATE 75 MG PO TABS
75.0000 mg | ORAL_TABLET | Freq: Every day | ORAL | Status: DC
  Administered 2018-04-18: 75 mg via ORAL
  Filled 2018-04-18: qty 1

## 2018-04-18 MED ORDER — AMITRIPTYLINE HCL 10 MG PO TABS: 10.0000 mg | ORAL_TABLET | Freq: Every day | ORAL | Status: DC

## 2018-04-18 MED ORDER — BACLOFEN 10 MG PO TABS: 10.0000 mg | ORAL_TABLET | Freq: Two times a day (BID) | ORAL | 0 refills | Status: DC

## 2018-04-18 MED ORDER — BRIMONIDINE TARTRATE 0.15 % OP SOLN
1.0000 [drp] | Freq: Two times a day (BID) | OPHTHALMIC | Status: DC
  Filled 2018-04-18: qty 5

## 2018-04-18 MED ORDER — FOLIC ACID 1 MG PO TABS: 1.0000 mg | ORAL_TABLET | Freq: Every day | ORAL | Status: DC

## 2018-04-18 MED ORDER — SODIUM CHLORIDE 0.9 % IV SOLN
INTRAVENOUS | Status: DC
  Administered 2018-04-18: 11:00:00 via INTRAVENOUS

## 2018-04-18 MED ORDER — PREDNISONE 5 MG PO TABS
5.0000 mg | ORAL_TABLET | Freq: Every day | ORAL | Status: DC
  Administered 2018-04-18: 5 mg via ORAL
  Filled 2018-04-18: qty 1

## 2018-04-18 NOTE — Progress Notes (Signed)
PROGRESS NOTE  Nathaniel [name] BTD:[id number] DOB: Apr 02, 1935 DOA: 04/17/2018 PCP: Shirline Frees, MD  HPI/Recap of past 24 hours: Nathaniel [name] is a 83 year old male with medical history significant for hypertension, blindness of left eye due to glaucoma, arthritis was brought by his wife to the ER complaining of generalized weakness, increasing confusion and frequent falls.  Patient's wife reported he fell in the garage a day prior to arrival to the ED, reporting his leg gave way.  Patient does have a right hip replacement.  Patient was recently treated with Bactrim for right lower extremity cellulitis.  Patient also takes multiple pain/sedated of medications for his arthritis.  In the ED, patient was noted to have AKI, with mild hyperkalemia.  CT head/MRI brain showed no acute abnormality.  UDS was positive for opiates.  Patient admitted for further management.   Today, patient reported feeling much better, awake, alert, oriented to person, place, time.  Denies any chest pain, shortness of breath, abdominal pain, no focal neurologic deficit noted.  Assessment/Plan: Active Problems:   Altered mental status   AKI (acute kidney injury) ([place])   Acute metabolic encephalopathy Improving Likely multifactorial, polypharmacy (takes multiple sedating/narcotics med), dehydration CT head/MRI brain negative for any acute changes Afebrile, no leukocytosis Restart medications gradually, to avoid withdrawal Vitamin B12, folate, RPR all pending Monitor closely  AKI Improving Likely due to recent Bactrim use, home lisinopril Baseline creatinine WNL, 1.74 on admission Continue IV fluids Held home indomethacin, lisinopril, gabapentin  Frequent falls Likely due to dehydration/polypharmacy in the setting of arthritis/left eye blindness No focal neurologic deficit noted CT head negative PT/OT  ??Rheumatoid arthritis/neuropathy Patient takes methotrexate, prednisone, baclofen, indomethacin, Norco 10,  amitriptyline, gabapentin ??polypharmacy Continue to hold indomethacin, gabapentin due to AKI Plan to adjust upon discharge  Prediabetes A1c noted to be 6.3, likely due to chronic steroid use PCP to follow-up Life style modification     Code Status: Full  Family Communication: None at bedside  Disposition Plan: Home once evaluation complete   Consultants:  None  Procedures:  None  Antimicrobials:  None  DVT prophylaxis: Lovenox   Objective: Vitals:   04/17/18 1754 04/17/18 1841 04/17/18 2012 04/18/18 0445  BP: 131/81 (!) 145/81 140/82 116/73  Pulse: 94 85 86 90  Resp: 13 18 19 19   Temp:  98.5 F (36.9 C) 98 F (36.7 C) 98.2 F (36.8 C)  TempSrc:  Oral Oral Oral  SpO2: 96% 100% 100% 97%    Intake/Output Summary (Last 24 hours) at 04/18/2018 1113 Last data filed at 04/18/2018 0800 Gross per 24 hour  Intake 1554.58 ml  Output 400 ml  Net 1154.58 ml   There were no vitals filed for this visit.  Exam:   General: NAD, alert, awake, oriented  Cardiovascular: S1, S2 present  Respiratory: CTA B  Abdomen: Soft, nontender, nondistended, bowel sounds present  Musculoskeletal: No pedal edema bilaterally  Skin: Normal  Psychiatry: Normal mood   Data Reviewed: CBC: Recent Labs  Lab 04/17/18 1253  WBC 9.7  NEUTROABS 7.4  HGB 13.2  HCT 41.7  MCV 101.2*  PLT 106   Basic Metabolic Panel: Recent Labs  Lab 04/17/18 1253 04/18/18 0553  NA 141 138  K 5.1 4.9  CL 112* 112*  CO2 23 20*  GLUCOSE 114* 111*  BUN 23* 21*  CREATININE 1.74* 1.42*  CALCIUM 9.1 8.5*   GFR: CrCl cannot be calculated (Unknown ideal weight.). Liver Function Tests: Recent Labs  Lab 04/17/18 1253  AST 36  ALT 15*  ALKPHOS 86  BILITOT 0.5  PROT 7.2  ALBUMIN 3.9   No results for input(s): LIPASE, AMYLASE in the last 168 hours. No results for input(s): AMMONIA in the last 168 hours. Coagulation Profile: Recent Labs  Lab 04/17/18 1253  INR 1.06   Cardiac  Enzymes: No results for input(s): CKTOTAL, CKMB, CKMBINDEX, TROPONINI in the last 168 hours. BNP (last 3 results) No results for input(s): PROBNP in the last 8760 hours. HbA1C: No results for input(s): HGBA1C in the last 72 hours. CBG: No results for input(s): GLUCAP in the last 168 hours. Lipid Profile: No results for input(s): CHOL, HDL, LDLCALC, TRIG, CHOLHDL, LDLDIRECT in the last 72 hours. Thyroid Function Tests: Recent Labs    04/18/18 0553  TSH 2.761   Anemia Panel: No results for input(s): VITAMINB12, FOLATE, FERRITIN, TIBC, IRON, RETICCTPCT in the last 72 hours. Urine analysis:    Component Value Date/Time   COLORURINE YELLOW 04/17/2018 1202   APPEARANCEUR CLEAR 04/17/2018 1202   LABSPEC 1.017 04/17/2018 1202   PHURINE 6.0 04/17/2018 1202   GLUCOSEU NEGATIVE 04/17/2018 1202   HGBUR NEGATIVE 04/17/2018 1202   BILIRUBINUR NEGATIVE 04/17/2018 1202   KETONESUR NEGATIVE 04/17/2018 1202   PROTEINUR NEGATIVE 04/17/2018 1202   UROBILINOGEN 0.2 04/19/2015 1010   NITRITE NEGATIVE 04/17/2018 1202   LEUKOCYTESUR TRACE (A) 04/17/2018 1202   Sepsis Labs: @LABRCNTIP (procalcitonin:4,lacticidven:4)  )No results found for this or any previous visit (from the past 240 hour(s)).    Studies: Dg Chest 2 View  Result Date: 04/17/2018 CLINICAL DATA:  Chest pain EXAM: CHEST - 2 VIEW COMPARISON:  04/19/2015 FINDINGS: Low lung volumes. Heart and mediastinal contours are within normal limits. No focal opacities or effusions. No acute bony abnormality. IMPRESSION: Low volumes.  No active cardiopulmonary disease. Electronically Signed   By: Rolm Baptise M.D.   On: 04/17/2018 12:37   Ct Head Wo Contrast  Result Date: 04/17/2018 CLINICAL DATA:  Altered level of consciousness EXAM: CT HEAD WITHOUT CONTRAST TECHNIQUE: Contiguous axial images were obtained from the base of the skull through the vertex without intravenous contrast. COMPARISON:  Is 04/19/2015 FINDINGS: Brain: No acute intracranial  abnormality. Specifically, no hemorrhage, hydrocephalus, mass lesion, acute infarction, or significant intracranial injury. Age related volume loss. Vascular: No hyperdense vessel or unexpected calcification. Skull: No acute calvarial abnormality. Sinuses/Orbits: Visualized paranasal sinuses and mastoids clear. Orbital soft tissues unremarkable. Postoperative changes in the left orbit. Other: None IMPRESSION: No acute intracranial abnormality. Electronically Signed   By: Rolm Baptise M.D.   On: 04/17/2018 12:28   Mr Brain Wo Contrast  Result Date: 04/17/2018 CLINICAL DATA:  83 y/o M; seen at urgent care 1 week ago for right leg swelling. Patient lethargic. Altered level of consciousness (LOC), unexplained. EXAM: MRI HEAD WITHOUT CONTRAST TECHNIQUE: Multiplanar, multiecho pulse sequences of the brain and surrounding structures were obtained without intravenous contrast. COMPARISON:  04/17/2018 CT head.  04/20/2015 MRI head. 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 Interval subcentimeter chronic infarction in the left parietal cortex. Stable small chronic left frontal white matter infarction. Stable mild chronic microvascular ischemic changes of white matter and brain parenchymal volume loss. No reduced diffusion to suggest acute or early subacute infarction. No abnormal susceptibility hypointensity indicate intracranial hemorrhage. No hydrocephalus, extra-axial collection, focal mass effect, or herniation. Vascular: Normal flow voids. Skull and upper cervical spine: Normal marrow signal. Sinuses/Orbits: Negative. Other: Bilateral intra-ocular lens replacement. IMPRESSION: 1. No acute intracranial abnormality identified. 2.  Interval small chronic infarctions within left inferior cerebellar hemisphere and left parietal cortex from 2016. 3. Stable mild chronic microvascular ischemic changes and parenchymal volume loss of the brain for age.  Electronically Signed   By: Kristine Garbe M.D.   On: 04/17/2018 14:58    Scheduled Meds: . amitriptyline  10 mg Oral Daily  . baclofen  10 mg Oral QID  . brimonidine  1 drop Both Eyes BID  . clopidogrel  75 mg Oral Daily  . dorzolamide-timolol  1 drop Both Eyes BID  . enoxaparin (LOVENOX) injection  40 mg Subcutaneous Q24H  . folic acid  1 mg Oral QHS  . predniSONE  5 mg Oral Daily    Continuous Infusions: . sodium chloride       LOS: 0 days     Alma Friendly, MD [facility]  If 7PM-7AM, please contact night-coverage [email or web address] Password [email or web address] 04/18/2018, 11:13 AM

## 2018-04-18 NOTE — Evaluation (Signed)
Physical Therapy Evaluation Patient Details Name: Nathaniel [name] MRN: [id number] DOB: [birth year] Today's Date: 04/18/2018   History of Present Illness   83 y.o. male with medical history significant of hypertension, blindness to the left eye due to glaucoma, right hip replacement, arthritis, recent cellulitis of the right leg treated with Bactrim admitted with generalized weakness increasing confusion and multiple falls. (with a fall the day before admission. Found to have Acute metabolic encephalopathy     Clinical Impression  On eval, pt was Min guard assist for mobility. He walked ~125 feet with a RW. No LOB with RW use. Pt denied dizziness/lightheadness. He reports chronic balance issues and a history of falls. Discussed f/u PT. He would likely benefit from continued PT for balance training. He politely declined any f/u PT during this session. Wife was present and she stated she felt comfortable managing his care at home. Pt and wife are eager to d/c home. Will follow during hospital stay.     Follow Up Recommendations Supervision for mobility/OOB    Equipment Recommendations  None recommended by PT    Recommendations for Other Services       Precautions / Restrictions Precautions Precautions: Fall Restrictions Weight Bearing Restrictions: No      Mobility  Bed Mobility Overal bed mobility: Independent                Transfers Overall transfer level: Needs assistance Equipment used: Rolling walker (2 wheeled) Transfers: Sit to/from Stand Sit to Stand: Supervision         General transfer comment: for safety. cues for hand placement.   Ambulation/Gait Ambulation/Gait assistance: Min guard Gait Distance (Feet): 125 Feet Assistive device: Rolling walker (2 wheeled) Gait Pattern/deviations: Step-through pattern     General Gait Details: close guard for safety. No LOB with RW use. Pt denied dizziness/lightheadedness  Stairs            Wheelchair  Mobility    Modified Rankin (Stroke Patients Only)       Balance Overall balance assessment: Needs assistance Sitting-balance support: No upper extremity supported;Feet supported Sitting balance-Leahy Scale: Normal     Standing balance support: Bilateral upper extremity supported Standing balance-Leahy Scale: Poor Standing balance comment: reliant on RW                             Pertinent Vitals/Pain Pain Assessment: No/denies pain    Home Living Family/patient expects to be discharged to:: Private residence Living Arrangements: Spouse/significant other Available Help at Discharge: Family;Available 24 hours/day Type of Home: House Home Access: Stairs to enter Entrance Stairs-Rails: None Entrance Stairs-Number of Steps: threshold step Home Layout: One level Home Equipment: Walker - 2 wheels;Bedside commode;Shower seat      Prior Function Level of Independence: Independent               Hand Dominance   Dominant Hand: Right    Extremity/Trunk Assessment   Upper Extremity Assessment Upper Extremity Assessment: Defer to OT evaluation    Lower Extremity Assessment Lower Extremity Assessment: Generalized weakness    Cervical / Trunk Assessment Cervical / Trunk Assessment: Normal  Communication   Communication: No difficulties  Cognition Arousal/Alertness: Awake/alert Behavior During Therapy: WFL for tasks assessed/performed Overall Cognitive Status: Within Functional Limits for tasks assessed  General Comments      Exercises     Assessment/Plan    PT Assessment Patient needs continued PT services  PT Problem List Decreased strength;Decreased balance;Decreased mobility;Decreased activity tolerance       PT Treatment Interventions DME instruction;Gait training;Functional mobility training;Therapeutic activities;Balance training;Patient/family education;Therapeutic exercise     PT Goals (Current goals can be found in the Care Plan section)  Acute Rehab PT Goals Patient Stated Goal: to go home PT Goal Formulation: With patient/family Time For Goal Achievement: 05/02/18 Potential to Achieve Goals: Good    Frequency Min 3X/week   Barriers to discharge        Co-evaluation       OT goals addressed during session: ADL's and self-care       AM-PAC PT "6 Clicks" Daily Activity  Outcome Measure Difficulty turning over in bed (including adjusting bedclothes, sheets and blankets)?: None Difficulty moving from lying on back to sitting on the side of the bed? : None Difficulty sitting down on and standing up from a chair with arms (e.g., wheelchair, bedside commode, etc,.)?: A Little Help needed moving to and from a bed to chair (including a wheelchair)?: A Little Help needed walking in hospital room?: A Little Help needed climbing 3-5 steps with a railing? : A Little 6 Click Score: 20    End of Session Equipment Utilized During Treatment: Gait belt Activity Tolerance: Patient tolerated treatment well Patient left: in chair;with call bell/phone within reach;with family/visitor present   PT Visit Diagnosis: Muscle weakness (generalized) (M62.81);Difficulty in walking, not elsewhere classified (R26.2)    Time: 0174-9449 PT Time Calculation (min) (ACUTE ONLY): 15 min   Charges:   PT Evaluation $PT Eval Moderate Complexity: 1 Mod     PT G Codes:          Weston Anna, MPT Pager: [phone]

## 2018-04-18 NOTE — Discharge Summary (Signed)
Discharge Summary  Nathaniel [name] YSA:[id number] DOB: [birth year]  PCP: Shirline Frees, MD  Admit date: 04/17/2018 Discharge date: 04/18/2018  Time spent: 30 mins  Recommendations for Outpatient Follow-up:  1. PCP in 1 week   Discharge Diagnoses:  Active Hospital Problems   Diagnosis Date Noted  . Altered mental status 04/17/2018  . AKI (acute kidney injury) ([place]) 04/17/2018    Resolved Hospital Problems  No resolved problems to display.    Discharge Condition: Stable  Diet recommendation: Heart healthy   Vitals:   04/18/18 0445 04/18/18 1441  BP: 116/73 (!) 157/82  Pulse: 90 93  Resp: 19 18  Temp: 98.2 F (36.8 C) 97.8 F (36.6 C)  SpO2: 97% 98%    History of present illness:  Nathaniel [name] is a 83 year old male with medical history significant for hypertension, blindness of left eye due to glaucoma, arthritis was brought by his wife to the ER complaining of generalized weakness, increasing confusion and frequent falls.  Patient's wife reported he fell in the garage a day prior to arrival to the ED, reporting his leg gave way.  Patient does have a right hip replacement.  Patient was recently treated with Bactrim for right lower extremity cellulitis.  Patient also takes multiple pain/sedated of medications for his arthritis.  In the ED, patient was noted to have AKI, with mild hyperkalemia.  CT head/MRI brain showed no acute abnormality.  UDS was positive for opiates.  Patient admitted for further management.   Today, patient reported feeling much better, awake, alert, oriented to person, place, time.  Denies any chest pain, shortness of breath, abdominal pain, no focal neurologic deficit noted. Wife and patient very eager to be discharged (Father's day). Discussed in detail with wife to assist in giving him his pills and to follow up with PCP for repeat labs. Advised to stay hydrated.   Hospital Course:  Active Problems:   Altered mental status   AKI (acute kidney injury)  ([place])  Acute metabolic encephalopathy Resolved Likely multifactorial, polypharmacy (takes multiple sedating/narcotics med), dehydration/AKI CT head/MRI brain negative for any acute changes Afebrile, no leukocytosis Restart medications gradually, to avoid withdrawal PCP to follow up with Vitamin B12, folate, RPR results  AKI Improving Likely due to recent Bactrim use, in addition to taking lisinopril Baseline creatinine WNL, 1.74-->1.42 Advised adequate hydration D/C indomethacin  Frequent falls Likely due to dehydration/polypharmacy in the setting of arthritis/left eye blindness No focal neurologic deficit noted CT head negative PT/OT  ??Rheumatoid arthritis/neuropathy Patient takes methotrexate, prednisone, baclofen, reports Norco 5, gabapentin ??polypharmacy Continue to hold indomethacin due to AKI PCP to further adjust meds  Prediabetes A1c noted to be 6.3, likely due to chronic steroid use PCP to follow-up Life style modification     Procedures:  None   Consultations:  None  Discharge Exam: BP (!) 157/82 (BP Location: Right Arm)   Pulse 93   Temp 97.8 F (36.6 C) (Oral)   Resp 18   SpO2 98%   General: NAD Cardiovascular: S1, S2 present Respiratory: CTAB  Discharge Instructions You were cared for by a hospitalist during your hospital stay. If you have any questions about your discharge medications or the care you received while you were in the hospital after you are discharged, you can call the unit and asked to speak with the hospitalist on call if the hospitalist that took care of you is not available. Once you are discharged, your primary care physician will handle any further medical issues. Please note  that NO REFILLS for any discharge medications will be authorized once you are discharged, as it is imperative that you return to your primary care physician (or establish a relationship with a primary care physician if you do not have one) for your  aftercare needs so that they can reassess your need for medications and monitor your lab values.   Allergies as of 04/18/2018      Reactions   Ibuprofen Hives   Conflict with other meds      Medication List    STOP taking these medications   amitriptyline 10 MG tablet Commonly known as:  ELAVIL   indomethacin 75 MG CR capsule Commonly known as:  INDOCIN SR   sulfamethoxazole-trimethoprim 800-160 MG tablet Commonly known as:  BACTRIM DS,SEPTRA DS     TAKE these medications   baclofen 10 MG tablet Commonly known as:  LIORESAL Take 1 tablet (10 mg total) by mouth 2 (two) times daily. What changed:  when to take this   brimonidine 0.15 % ophthalmic solution Commonly known as:  ALPHAGAN Place 1 drop into both eyes 2 (two) times daily.   cetirizine 10 MG tablet Commonly known as:  ZYRTEC Take 10 mg by mouth daily.   clopidogrel 75 MG tablet Commonly known as:  PLAVIX Take 1 tablet (75 mg total) by mouth daily.   diclofenac sodium 1 % Gel Commonly known as:  VOLTAREN Apply 1 application topically 4 (four) times daily as needed (pain).   dorzolamide-timolol 22.3-6.8 MG/ML ophthalmic solution Commonly known as:  COSOPT Place 1 drop into both eyes 2 (two) times daily.   fluticasone 0.05 % cream Commonly known as:  CUTIVATE Apply 1 application topically 2 (two) times daily as needed (rash).   folic acid 1 MG tablet Commonly known as:  FOLVITE Take 1 mg by mouth at bedtime.   gabapentin 800 MG tablet Commonly known as:  NEURONTIN Take 800 mg by mouth 2 (two) times daily.   HYDROcodone-acetaminophen 10-325 MG tablet Commonly known as:  NORCO Take 1 tablet by mouth every 4 (four) hours as needed.   ketoconazole 2 % cream Commonly known as:  NIZORAL Apply 1 application topically 2 (two) times daily as needed for rash.   lisinopril 20 MG tablet Commonly known as:  PRINIVIL,ZESTRIL Take 20 mg by mouth at bedtime.   methotrexate 2.5 MG tablet Commonly known as:   RHEUMATREX Take 15 mg by mouth once a week.   predniSONE 5 MG tablet Commonly known as:  DELTASONE Take 5 mg by mouth daily.   Travoprost (BAK Free) 0.004 % Soln ophthalmic solution Commonly known as:  TRAVATAN Place 1 drop into both eyes at bedtime.   VITAMIN E COMPLETE Caps Take 1 capsule by mouth daily as needed (for supplementation).      Allergies  Allergen Reactions  . Ibuprofen Hives    Conflict with other meds   Follow-up Information    Shirline Frees, MD. Schedule an appointment as soon as possible for a visit in 1 week(s).   Specialty:  Family Medicine Contact information: [place] [address] [phone]            The results of significant diagnostics from this hospitalization (including imaging, microbiology, ancillary and laboratory) are listed below for reference.    Significant Diagnostic Studies: Dg Chest 2 View  Result Date: 04/17/2018 CLINICAL DATA:  Chest pain EXAM: CHEST - 2 VIEW COMPARISON:  04/19/2015 FINDINGS: Low lung volumes. Heart and mediastinal contours  are within normal limits. No focal opacities or effusions. No acute bony abnormality. IMPRESSION: Low volumes.  No active cardiopulmonary disease. Electronically Signed   By: Rolm Baptise M.D.   On: 04/17/2018 12:37   Ct Head Wo Contrast  Result Date: 04/17/2018 CLINICAL DATA:  Altered level of consciousness EXAM: CT HEAD WITHOUT CONTRAST TECHNIQUE: Contiguous axial images were obtained from the base of the skull through the vertex without intravenous contrast. COMPARISON:  Is 04/19/2015 FINDINGS: Brain: No acute intracranial abnormality. Specifically, no hemorrhage, hydrocephalus, mass lesion, acute infarction, or significant intracranial injury. Age related volume loss. Vascular: No hyperdense vessel or unexpected calcification. Skull: No acute calvarial abnormality. Sinuses/Orbits: Visualized paranasal sinuses and mastoids clear. Orbital soft tissues unremarkable.  Postoperative changes in the left orbit. Other: None IMPRESSION: No acute intracranial abnormality. Electronically Signed   By: Rolm Baptise M.D.   On: 04/17/2018 12:28   Mr Brain Wo Contrast  Result Date: 04/17/2018 CLINICAL DATA:  83 y/o M; seen at urgent care 1 week ago for right leg swelling. Patient lethargic. Altered level of consciousness (LOC), unexplained. EXAM: MRI HEAD WITHOUT CONTRAST TECHNIQUE: Multiplanar, multiecho pulse sequences of the brain and surrounding structures were obtained without intravenous contrast. COMPARISON:  04/17/2018 CT head.  04/20/2015 MRI head. 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 Interval subcentimeter chronic infarction in the left parietal cortex. Stable small chronic left frontal white matter infarction. Stable mild chronic microvascular ischemic changes of white matter and brain parenchymal volume loss. No reduced diffusion to suggest acute or early subacute infarction. No abnormal susceptibility hypointensity indicate intracranial hemorrhage. No hydrocephalus, extra-axial collection, focal mass effect, or herniation. Vascular: Normal flow voids. Skull and upper cervical spine: Normal marrow signal. Sinuses/Orbits: Negative. Other: Bilateral intra-ocular lens replacement. IMPRESSION: 1. No acute intracranial abnormality identified. 2. Interval small chronic infarctions within left inferior cerebellar hemisphere and left parietal cortex from 2016. 3. Stable mild chronic microvascular ischemic changes and parenchymal volume loss of the brain for age. Electronically Signed   By: Kristine Garbe M.D.   On: 04/17/2018 14:58    Microbiology: No results found for this or any previous visit (from the past 240 hour(s)).   Labs: Basic Metabolic Panel: Recent Labs  Lab 04/17/18 1253 04/18/18 0553  NA 141 138  K 5.1 4.9  CL 112* 112*  CO2 23 20*  GLUCOSE 114* 111*  BUN 23* 21*  CREATININE  1.74* 1.42*  CALCIUM 9.1 8.5*   Liver Function Tests: Recent Labs  Lab 04/17/18 1253  AST 36  ALT 15*  ALKPHOS 86  BILITOT 0.5  PROT 7.2  ALBUMIN 3.9   No results for input(s): LIPASE, AMYLASE in the last 168 hours. No results for input(s): AMMONIA in the last 168 hours. CBC: Recent Labs  Lab 04/17/18 1253  WBC 9.7  NEUTROABS 7.4  HGB 13.2  HCT 41.7  MCV 101.2*  PLT 222   Cardiac Enzymes: No results for input(s): CKTOTAL, CKMB, CKMBINDEX, TROPONINI in the last 168 hours. BNP: BNP (last 3 results) No results for input(s): BNP in the last 8760 hours.  ProBNP (last 3 results) No results for input(s): PROBNP in the last 8760 hours.  CBG: No results for input(s): GLUCAP in the last 168 hours.     Signed:  Alma Friendly, MD [facility] 04/18/2018, 3:00 PM

## 2018-04-18 NOTE — Evaluation (Signed)
Occupational Therapy Evaluation and Discharge Patient Details Name: Nathaniel [name] MRN: [id number] DOB: 1934-12-29 Today's Date: 04/18/2018    History of Present Illness Nathaniel [name] is a 84 y.o. male with medical history significant of hypertension, blindness to the left eye due to glaucoma, right hip replacement, arthritis, recent cellulitis of the right leg treated with Bactrim admitted with generalized weakness increasing confusion and multiple falls. (with a fall the day before admission. Found to have Acute metabolic encephalopathy   Clinical Impression   This 83 yo male admitted with above presents to acute OT at a S level due to falls and wife reports she is with him most of the time. No further OT needs, we will D/C from OT.    Follow Up Recommendations  No OT follow up;Supervision/Assistance - 24 hour    Equipment Recommendations  None recommended by OT       Precautions / Restrictions Precautions Precautions: Fall Restrictions Weight Bearing Restrictions: No      Mobility Bed Mobility Overal bed mobility: Independent                Transfers Overall transfer level: Needs assistance Equipment used: Rolling walker (2 wheeled) Transfers: Sit to/from Stand Sit to Stand: Supervision              Balance Overall balance assessment: Needs assistance Sitting-balance support: No upper extremity supported;Feet supported       Standing balance support: Bilateral upper extremity supported Standing balance-Leahy Scale: Poor Standing balance comment: reliant on RW                           ADL either performed or assessed with clinical judgement   ADL                                         General ADL Comments: Overall at a S level due to history of falls     Vision Baseline Vision/History: (blind left eye) Patient Visual Report: No change from baseline              Pertinent Vitals/Pain Pain Assessment: No/denies pain      Hand Dominance Right   Extremity/Trunk Assessment Upper Extremity Assessment Upper Extremity Assessment: Overall WFL for tasks assessed           Communication Communication Communication: No difficulties   Cognition Arousal/Alertness: Awake/alert Behavior During Therapy: WFL for tasks assessed/performed Overall Cognitive Status: Within Functional Limits for tasks assessed                                                Home Living Family/patient expects to be discharged to:: Private residence Living Arrangements: Spouse/significant other Available Help at Discharge: Family;Available 24 hours/day Type of Home: House Home Access: Stairs to enter [employer] of Steps: threshold step Entrance Stairs-Rails: None Home Layout: One level     Bathroom Shower/Tub: Teacher, early years/pre: Handicapped height     Home Equipment: Environmental consultant - 2 wheels;Bedside commode;Shower seat          Prior Functioning/Environment Level of Independence: Independent                 OT Problem List: Impaired balance (  sitting and/or standing)         OT Goals(Current goals can be found in the care plan section) Acute Rehab OT Goals Patient Stated Goal: to go home  OT Frequency:                AM-PAC PT "6 Clicks" Daily Activity     Outcome Measure Help from another person eating meals?: None Help from another person taking care of personal grooming?: A Little Help from another person toileting, which includes using toliet, bedpan, or urinal?: A Little Help from another person bathing (including washing, rinsing, drying)?: A Little Help from another person to put on and taking off regular upper body clothing?: A Little Help from another person to put on and taking off regular lower body clothing?: A Little 6 Click Score: 19   End of Session Equipment Utilized During Treatment: Gait belt;Rolling walker Nurse Communication: (pt good  to go from therapy standpoint)  Activity Tolerance: Patient tolerated treatment well Patient left: in chair;with call bell/phone within reach;with chair alarm set  OT Visit Diagnosis: History of falling (Z91.81)                Time: 1610-9604 OT Time Calculation (min): 15 min Charges:  OT General Charges $OT Visit: 1 Visit OT Evaluation $OT Eval Moderate Complexity: [address]  [place], [place] [phone] 04/18/2018

## 2018-04-19 LAB — POCT I-STAT TROPONIN I: Troponin i, poc: 0.01 ng/mL (ref 0.00–0.08)

## 2018-04-19 LAB — FOLATE RBC
FOLATE, HEMOLYSATE: 439.9 ng/mL
FOLATE, RBC: 1167 ng/mL (ref >498)
HEMATOCRIT: 37.7 % (ref 37.5–51.0)

## 2018-04-19 LAB — RPR: RPR: NONREACTIVE

## 2018-04-20 DIAGNOSIS — R531 Weakness: Secondary | ICD-10-CM

## 2018-04-20 LAB — VITAMIN B1: VITAMIN B1 (THIAMINE): 102.8 nmol/L (ref 66.5–200.0)

## 2019-04-21 ENCOUNTER — Other Ambulatory Visit: Payer: Self-pay

## 2019-04-21 ENCOUNTER — Encounter (HOSPITAL_COMMUNITY): Payer: Self-pay | Admitting: Emergency Medicine

## 2019-04-21 ENCOUNTER — Inpatient Hospital Stay (HOSPITAL_COMMUNITY)
Admission: EM | Admit: 2019-04-21 | Discharge: 2019-04-24 | DRG: 378 | Disposition: A | Discharge status: Home / Self Care | Payer: Medicare Other | Attending: Internal Medicine | Admitting: Internal Medicine

## 2019-04-21 DIAGNOSIS — K449 Diaphragmatic hernia without obstruction or gangrene: Secondary | ICD-10-CM | POA: Diagnosis present

## 2019-04-21 DIAGNOSIS — Z86010 Personal history of colonic polyps: Secondary | ICD-10-CM

## 2019-04-21 DIAGNOSIS — Z7902 Long term (current) use of antithrombotics/antiplatelets: Secondary | ICD-10-CM

## 2019-04-21 DIAGNOSIS — K222 Esophageal obstruction: Secondary | ICD-10-CM | POA: Diagnosis present

## 2019-04-21 DIAGNOSIS — H5462 Unqualified visual loss, left eye, normal vision right eye: Secondary | ICD-10-CM | POA: Diagnosis present

## 2019-04-21 DIAGNOSIS — K219 Gastro-esophageal reflux disease without esophagitis: Secondary | ICD-10-CM | POA: Diagnosis present

## 2019-04-21 DIAGNOSIS — D696 Thrombocytopenia, unspecified: Secondary | ICD-10-CM | POA: Diagnosis present

## 2019-04-21 DIAGNOSIS — K921 Melena: Secondary | ICD-10-CM

## 2019-04-21 DIAGNOSIS — I5043 Acute on chronic combined systolic (congestive) and diastolic (congestive) heart failure: Secondary | ICD-10-CM | POA: Diagnosis present

## 2019-04-21 DIAGNOSIS — Z8673 Personal history of transient ischemic attack (TIA), and cerebral infarction without residual deficits: Secondary | ICD-10-CM

## 2019-04-21 DIAGNOSIS — Z1159 Encounter for screening for other viral diseases: Secondary | ICD-10-CM

## 2019-04-21 DIAGNOSIS — G8929 Other chronic pain: Secondary | ICD-10-CM | POA: Diagnosis present

## 2019-04-21 DIAGNOSIS — D539 Nutritional anemia, unspecified: Secondary | ICD-10-CM | POA: Diagnosis present

## 2019-04-21 DIAGNOSIS — K31811 Angiodysplasia of stomach and duodenum with bleeding: Principal | ICD-10-CM | POA: Diagnosis present

## 2019-04-21 DIAGNOSIS — I5042 Chronic combined systolic (congestive) and diastolic (congestive) heart failure: Secondary | ICD-10-CM | POA: Diagnosis not present

## 2019-04-21 DIAGNOSIS — K922 Gastrointestinal hemorrhage, unspecified: Secondary | ICD-10-CM | POA: Diagnosis not present

## 2019-04-21 DIAGNOSIS — J302 Other seasonal allergic rhinitis: Secondary | ICD-10-CM | POA: Diagnosis present

## 2019-04-21 DIAGNOSIS — H409 Unspecified glaucoma: Secondary | ICD-10-CM | POA: Diagnosis present

## 2019-04-21 DIAGNOSIS — I110 Hypertensive heart disease with heart failure: Secondary | ICD-10-CM | POA: Diagnosis present

## 2019-04-21 DIAGNOSIS — M069 Rheumatoid arthritis, unspecified: Secondary | ICD-10-CM | POA: Diagnosis present

## 2019-04-21 DIAGNOSIS — K920 Hematemesis: Secondary | ICD-10-CM

## 2019-04-21 DIAGNOSIS — Z96659 Presence of unspecified artificial knee joint: Secondary | ICD-10-CM | POA: Diagnosis present

## 2019-04-21 DIAGNOSIS — E162 Hypoglycemia, unspecified: Secondary | ICD-10-CM | POA: Diagnosis present

## 2019-04-21 DIAGNOSIS — R944 Abnormal results of kidney function studies: Secondary | ICD-10-CM | POA: Diagnosis present

## 2019-04-21 DIAGNOSIS — D62 Acute posthemorrhagic anemia: Secondary | ICD-10-CM | POA: Diagnosis present

## 2019-04-21 DIAGNOSIS — Z96641 Presence of right artificial hip joint: Secondary | ICD-10-CM | POA: Diagnosis present

## 2019-04-21 LAB — COMPREHENSIVE METABOLIC PANEL
ALT: 15 U/L (ref 0–44)
AST: 18 U/L (ref 15–41)
Albumin: 4 g/dL (ref 3.5–5.0)
Alkaline Phosphatase: 83 U/L (ref 38–126)
Anion gap: 7 (ref 5–15)
BUN: 32 mg/dL — ABNORMAL HIGH (ref 8–23)
CO2: 23 mmol/L (ref 22–32)
Calcium: 9.3 mg/dL (ref 8.9–10.3)
Chloride: 114 mmol/L — ABNORMAL HIGH (ref 98–111)
Creatinine, Ser: 0.86 mg/dL (ref 0.61–1.24)
GFR calc Af Amer: >60 mL/min (ref >60)
GFR calc non Af Amer: >60 mL/min (ref >60)
Glucose, Bld: 147 mg/dL — ABNORMAL HIGH (ref 70–99)
Potassium: 3.8 mmol/L (ref 3.5–5.1)
Sodium: 144 mmol/L (ref 135–145)
Total Bilirubin: 0.6 mg/dL (ref 0.3–1.2)
Total Protein: 7.2 g/dL (ref 6.5–8.1)

## 2019-04-21 LAB — CBC
HCT: 36.6 % — ABNORMAL LOW (ref 39.0–52.0)
Hemoglobin: 11 g/dL — ABNORMAL LOW (ref 13.0–17.0)
MCH: 31.1 pg (ref 26.0–34.0)
MCHC: 30.1 g/dL (ref 30.0–36.0)
MCV: 103.4 fL — ABNORMAL HIGH (ref 80.0–100.0)
Platelets: 188 10*3/uL (ref 150–400)
RBC: 3.54 MIL/uL — ABNORMAL LOW (ref 4.22–5.81)
RDW: 16.8 % — ABNORMAL HIGH (ref 11.5–15.5)
WBC: 7.4 10*3/uL (ref 4.0–10.5)
nRBC: 0 % (ref 0.0–0.2)

## 2019-04-21 LAB — TYPE AND SCREEN
ABO/RH(D): B POS
Antibody Screen: NEGATIVE

## 2019-04-21 LAB — POC OCCULT BLOOD, ED: Fecal Occult Bld: POSITIVE — AB

## 2019-04-21 MED ORDER — LATANOPROST 0.005 % OP SOLN
1.0000 [drp] | Freq: Every day | OPHTHALMIC | Status: DC
  Administered 2019-04-21 – 2019-04-23 (×3): 1 [drp] via OPHTHALMIC
  Filled 2019-04-21: qty 2.5

## 2019-04-21 MED ORDER — SODIUM CHLORIDE 0.9 % IV SOLN
8.0000 mg/h | INTRAVENOUS | Status: DC
  Administered 2019-04-21 – 2019-04-23 (×3): 8 mg/h via INTRAVENOUS
  Filled 2019-04-21 (×6): qty 80

## 2019-04-21 MED ORDER — PANTOPRAZOLE SODIUM 40 MG IV SOLR: 40.0000 mg | Freq: Two times a day (BID) | INTRAVENOUS | Status: DC

## 2019-04-21 MED ORDER — SODIUM CHLORIDE 0.9% FLUSH
3.0000 mL | Freq: Two times a day (BID) | INTRAVENOUS | Status: DC
  Administered 2019-04-21 – 2019-04-24 (×4): 3 mL via INTRAVENOUS

## 2019-04-21 MED ORDER — SODIUM CHLORIDE 0.45 % IV SOLN
INTRAVENOUS | Status: DC
  Administered 2019-04-21: 23:00:00 via INTRAVENOUS

## 2019-04-21 MED ORDER — SODIUM CHLORIDE 0.9 % IV BOLUS
750.0000 mL | Freq: Once | INTRAVENOUS | Status: AC
  Administered 2019-04-21: 750 mL via INTRAVENOUS

## 2019-04-21 MED ORDER — DORZOLAMIDE HCL-TIMOLOL MAL 2-0.5 % OP SOLN
1.0000 [drp] | Freq: Two times a day (BID) | OPHTHALMIC | Status: DC
  Administered 2019-04-21 – 2019-04-24 (×5): 1 [drp] via OPHTHALMIC
  Filled 2019-04-21: qty 10

## 2019-04-21 MED ORDER — ONDANSETRON HCL 4 MG PO TABS: 4.0000 mg | ORAL_TABLET | Freq: Four times a day (QID) | ORAL | Status: DC | PRN

## 2019-04-21 MED ORDER — ONDANSETRON HCL 4 MG/2ML IJ SOLN
4.0000 mg | Freq: Four times a day (QID) | INTRAMUSCULAR | Status: DC | PRN
  Administered 2019-04-21 – 2019-04-22 (×2): 4 mg via INTRAVENOUS
  Filled 2019-04-21 (×2): qty 2

## 2019-04-21 MED ORDER — SODIUM CHLORIDE 0.9 % IV SOLN
80.0000 mg | Freq: Once | INTRAVENOUS | Status: AC
  Administered 2019-04-21: 80 mg via INTRAVENOUS
  Filled 2019-04-21: qty 80

## 2019-04-21 NOTE — ED Notes (Signed)
ED TO INPATIENT HANDOFF REPORT  Name/Age/Gender Nathaniel [name] 84 y.o. male  Code Status Code Status History    Date Active Date Inactive Code Status Order ID Comments User Context   04/17/2018 1656 04/18/2018 2003 Full Code [id number]  Georgette Shell, MD ED   04/19/2015 1536 04/21/2015 1818 Full Code [id number]  Oswald Hillock, MD Inpatient   12/15/2011 1534 12/19/2011 1848 Full Code [id number]  Cyndra Numbers, RN Inpatient   Advance Care Planning Activity      Home/SNF/Other Home  Chief Complaint GI bleed  Level of Care/Admitting Diagnosis ED Disposition    ED Disposition Condition [facility] Area: [facility] [[id number]]  Level of Care: Telemetry [5]  Admit to tele based on following criteria: Monitor for Ischemic changes  Covid Evaluation: Screening Protocol (No Symptoms)  Diagnosis: Acute upper GI bleeding [627035]  Admitting Physician: Vianne Bulls [[id number]]  Attending Physician: Vianne Bulls [[id number]]  PT Class (Do Not Modify): Observation [104]  PT Acc Code (Do Not Modify): Observation [10022]       Medical History Past Medical History:  Diagnosis Date  . AKI (acute kidney injury) ([place]) 04/17/2018  . Altered mental status 04/17/2018  . Arthritis   . Blood transfusion 2005   s/p hip replacement  . Colon polyps   . Environmental allergies    pollen , leaves, grass  . Erectile dysfunction   . Glaucoma of left eye    legally blind in left eye  . Hypertension     Allergies Allergies  Allergen Reactions  . Ibuprofen Hives    Conflict with other meds  . Tylenol [Acetaminophen] Rash    IV Location/Drains/Wounds Patient Lines/Drains/Airways Status   Active Line/Drains/Airways    Name:   Placement date:   Placement time:   Site:   Days:   Peripheral IV 04/21/19 Right Antecubital   04/21/19    1945    Antecubital   less than 1          Labs/Imaging Results for orders placed or performed during the hospital  encounter of 04/21/19 (from the past 48 hour(s))  Comprehensive metabolic panel     Status: Abnormal   Collection Time: 04/21/19  7:38 PM  Result Value Ref Range   Sodium 144 135 - 145 mmol/L   Potassium 3.8 3.5 - 5.1 mmol/L   Chloride 114 (H) 98 - 111 mmol/L   CO2 23 22 - 32 mmol/L   Glucose, Bld 147 (H) 70 - 99 mg/dL   BUN 32 (H) 8 - 23 mg/dL   Creatinine, Ser 0.86 0.61 - 1.24 mg/dL   Calcium 9.3 8.9 - 10.3 mg/dL   Total Protein 7.2 6.5 - 8.1 g/dL   Albumin 4.0 3.5 - 5.0 g/dL   AST 18 15 - 41 U/L   ALT 15 0 - 44 U/L   Alkaline Phosphatase 83 38 - 126 U/L   Total Bilirubin 0.6 0.3 - 1.2 mg/dL   GFR calc non Af Amer >60 >60 mL/min   GFR calc Af Amer >60 >60 mL/min   Anion gap 7 5 - 15    Comment: Performed at [facility], [place] [address], [place], [place] [address]  CBC     Status: Abnormal   Collection Time: 04/21/19  7:38 PM  Result Value Ref Range   WBC 7.4 4.0 - 10.5 K/uL   RBC 3.54 (L) 4.22 - 5.81 MIL/uL   Hemoglobin 11.0 (L)  13.0 - 17.0 g/dL   HCT 36.6 (L) 39.0 - 52.0 %   MCV 103.4 (H) 80.0 - 100.0 fL   MCH 31.1 26.0 - 34.0 pg   MCHC 30.1 30.0 - 36.0 g/dL   RDW 16.8 (H) 11.5 - 15.5 %   Platelets 188 150 - 400 K/uL   nRBC 0.0 0.0 - 0.2 %    Comment: Performed at [facility], [place] [address] [place], [place] [address]  Type and screen [place]     Status: None   Collection Time: 04/21/19  7:38 PM  Result Value Ref Range   ABO/RH(D) B POS    Antibody Screen NEG    Sample Expiration      04/24/2019,2359 Performed at [facility], [place] [address] [place], [place] [address]   POC occult blood, ED     Status: Abnormal   Collection Time: 04/21/19  9:00 PM  Result Value Ref Range   Fecal Occult Bld POSITIVE (A) NEGATIVE   No results found.  Pending Labs Unresulted Labs (From admission, onward)    Start     Ordered   04/21/19 2119  Novel Coronavirus,NAA,(SEND-OUT TO REF LAB - TAT 24-48  hrs); Hosp Order  (Asymptomatic Patients Labs)  Once,   STAT    Question:  Rule Out  Answer:  Yes   04/21/19 2118   04/21/19 1938  ABO/Rh  Once,   R     04/21/19 1938   Signed and Held  Basic metabolic panel  Tomorrow morning,   R     Signed and Held   Signed and Held  Hemoglobin  Now then every 8 hours,   R     Signed and Held   Signed and Held  Hematocrit  Now then every 8 hours,   R     Signed and Held          Vitals/Pain Today's Vitals   04/21/19 1922 04/21/19 2000 04/21/19 2128 04/21/19 2136  BP: 127/79 138/76 132/84 132/84  Pulse: (!) 108 (!) 107 (!) 104 (!) 104  Resp: 15 16 18 20   Temp:      TempSrc:      SpO2: 98% 98% 98% 98%  Weight:      Height:      PainSc:        Isolation Precautions No active isolations  Medications Medications  pantoprazole (PROTONIX) 80 mg in sodium chloride 0.9 % 100 mL IVPB (80 mg Intravenous New Bag/Given (Non-Interop) 04/21/19 2214)  pantoprazole (PROTONIX) 80 mg in sodium chloride 0.9 % 250 mL (0.32 mg/mL) infusion (has no administration in time range)  pantoprazole (PROTONIX) injection 40 mg (has no administration in time range)  sodium chloride 0.9 % bolus 750 mL (750 mLs Intravenous New Bag/Given (Non-Interop) 04/21/19 2214)    Mobility walks

## 2019-04-21 NOTE — ED Triage Notes (Signed)
Per pt/family member-states 1 episode of dark red blood in stool and vomit today-states no abdominal pain, no history of symptom-states he is just nauseated-family member will wait in car-please call with updates when able

## 2019-04-21 NOTE — ED Provider Notes (Signed)
[facility] DEPT Provider Note   CSN: [id number] Arrival date & time: 04/21/19  1832    History   Chief Complaint Chief Complaint  Patient presents with  . Rectal Bleeding    HPI Nathaniel [name] is a 84 y.o. male.     HPI Patient presents with episode of vomiting red blood and reported bloody stool.  No abdominal pain.  Has had some nausea.  He is on Plavix.  States he just feels a little bad all over.  No abdominal pain.  No trauma.  States he has seen gastroenterology before but does not remember the name. Past Medical History:  Diagnosis Date  . AKI (acute kidney injury) ([place]) 04/17/2018  . Altered mental status 04/17/2018  . Arthritis   . Blood transfusion 2005   s/p hip replacement  . Colon polyps   . Environmental allergies    pollen , leaves, grass  . Erectile dysfunction   . Glaucoma of left eye    legally blind in left eye  . Hypertension     Patient Active Problem List   Diagnosis Date Noted  . Acute upper GI bleed 04/21/2019  . Acute upper GI bleeding 04/21/2019  . Rheumatoid arthritis ([place]) 04/21/2019  . Chronic pain 04/21/2019  . Chronic combined systolic and diastolic CHF (congestive heart failure) ([place]) 04/21/2019  . Macrocytic anemia 04/21/2019  . Generalized weakness   . Altered mental status 04/17/2018  . AKI (acute kidney injury) ([place]) 04/17/2018  . Dizziness   . Leg weakness 04/19/2015  . Osteoarthritis of right knee 12/19/2011    Past Surgical History:  Procedure Laterality Date  . CATARACT EXTRACTION     bilateral  . EYE SURGERY    . GLAUCOMA VALVE INSERTION  2010,2011   Left eye  . HIP ARTHROPLASTY  2005   Right Hip replacement x 3  . JOINT REPLACEMENT    . MULTIPLE TOOTH EXTRACTIONS  2012   had 23 teeth pulled  . TOENAIL EXCISION    . TOTAL KNEE ARTHROPLASTY  12/15/2011   Procedure: TOTAL KNEE ARTHROPLASTY;  Surgeon: Kerin Salen, MD;  Location: [facility];  Service: Orthopedics;  Laterality: Right;  [place] Medications    Prior to Admission medications   Medication Sig Start Date End Date Taking? Authorizing Provider  baclofen (LIORESAL) 10 MG tablet Take 1 tablet (10 mg total) by mouth 2 (two) times daily. 04/18/18  Yes Alma Friendly, MD  brimonidine (ALPHAGAN) 0.15 % ophthalmic solution Place 1 drop into both eyes 2 (two) times daily.    Yes [provider]  cetirizine (ZYRTEC) 10 MG tablet Take 10 mg by mouth daily.   Yes [provider]  clopidogrel (PLAVIX) 75 MG tablet Take 1 tablet (75 mg total) by mouth daily. 04/21/15  Yes Eulogio Bear U, DO  diclofenac sodium (VOLTAREN) 1 % GEL Apply 1 application topically 4 (four) times daily as needed (pain).  04/15/18  Yes [provider]  dorzolamide-timolol (COSOPT) 22.3-6.8 MG/ML ophthalmic solution Place 1 drop into both eyes 2 (two) times daily.   Yes [provider]  FIBER ADULT GUMMIES PO Take 1 tablet by mouth daily.   Yes [provider]  gabapentin (NEURONTIN) 800 MG tablet Take 800 mg by mouth 2 (two) times daily.    Yes [provider]  HYDROcodone-acetaminophen (NORCO) 10-325 MG per tablet Take 1 tablet by mouth at bedtime as needed for  moderate pain.  03/09/15  Yes [provider]  lisinopril (PRINIVIL,ZESTRIL) 20 MG tablet Take 20 mg by mouth at bedtime.    Yes [provider]  methotrexate 50 MG/2ML injection Inject 50 mg/m2 into the vein once a week.   Yes [provider]  Travoprost, BAK Free, (TRAVATAN) 0.004 % SOLN ophthalmic solution Place 1 drop into both eyes at bedtime.   Yes [provider]  TURMERIC PO Take 1 capsule by mouth daily.   Yes [provider]  Vitamin Mixture (VITAMIN E COMPLETE) CAPS Take 1 capsule by mouth daily as needed (for supplementation).   Yes [provider]  folic acid (FOLVITE) 1 MG tablet Take 1 mg by mouth at bedtime.     [provider]    Family  History History reviewed. No pertinent family history.  Social History Social History   Tobacco Use  . Smoking status: Former Smoker    Packs/day: 0.25    Years: 40.00    Pack years: 10.00    Types: Cigarettes    Quit date: 12/07/1998    Years since quitting: 20.3  . Smokeless tobacco: Former Systems developer    Types: Chew  Substance Use Topics  . Alcohol use: Yes    Comment: rare  . Drug use: No     Allergies   Ibuprofen and Tylenol [acetaminophen]   Review of Systems Review of Systems  Constitutional: Negative for appetite change.  HENT: Negative for congestion.   Gastrointestinal: Positive for blood in stool, nausea and vomiting. Negative for abdominal pain.  Genitourinary: Negative for flank pain.  Musculoskeletal: Negative for back pain.  Skin: Negative for rash and wound.  Neurological: Negative for weakness.     Physical Exam Updated Vital Signs BP 132/84 (BP Location: Left Arm)   Pulse (!) 104   Temp 97.8 F (36.6 C) (Oral)   Resp 20   Ht 5\' 11"  (1.803 m)   Wt 82.6 kg   SpO2 98%   BMI 25.38 kg/m   Physical Exam Vitals signs and nursing note reviewed.  HENT:     Head: Normocephalic.  Cardiovascular:     Rate and Rhythm: Tachycardia present.  Pulmonary:     Effort: Pulmonary effort is normal.  Abdominal:     Tenderness: There is no abdominal tenderness.  Genitourinary:    Rectum: Guaiac result positive.     Comments: [place] to black stool.  No frank blood. Skin:    Capillary Refill: Capillary refill takes less than 2 seconds.  Neurological:     Mental Status: He is alert and oriented to person, place, and time.      ED Treatments / Results  Labs (all labs ordered are listed, but only abnormal results are displayed) Labs Reviewed  COMPREHENSIVE METABOLIC PANEL - Abnormal; Notable for the following components:      Result Value   Chloride 114 (*)    Glucose, Bld 147 (*)    BUN 32 (*)    All other components within normal limits  CBC - Abnormal;  Notable for the following components:   RBC 3.54 (*)    Hemoglobin 11.0 (*)    HCT 36.6 (*)    MCV 103.4 (*)    RDW 16.8 (*)    All other components within normal limits  POC OCCULT BLOOD, ED - Abnormal; Notable for the following components:   Fecal Occult Bld POSITIVE (*)    All other components within normal limits  NOVEL CORONAVIRUS, [facility]  ORDER, SEND-OUT TO REF LAB)  TYPE AND SCREEN    EKG None  Radiology No results found.  Procedures Procedures (including critical care time)  Medications Ordered in ED Medications  pantoprazole (PROTONIX) 80 mg in sodium chloride 0.9 % 100 mL IVPB (has no administration in time range)  pantoprazole (PROTONIX) 80 mg in sodium chloride 0.9 % 250 mL (0.32 mg/mL) infusion (has no administration in time range)  pantoprazole (PROTONIX) injection 40 mg (has no administration in time range)     Initial Impression / Assessment and Plan / ED Course  I have reviewed the triage vital signs and the nursing notes.  Pertinent labs & imaging results that were available during my care of the patient were reviewed by me and considered in my medical decision making (see chart for details).        Patient with GI bleed.  Mild anemia.  Tachycardia but not hypotensive.  On Plavix.  Will start Protonix.  Admit to internal medicine.  CRITICAL CARE Performed by: Davonna Belling Total critical care time: 30 minutes Critical care time was exclusive of separately billable procedures and treating other patients. Critical care was necessary to treat or prevent imminent or life-threatening deterioration. Critical care was time spent personally by me on the following activities: development of treatment plan with patient and/or surrogate as well as nursing, discussions with consultants, evaluation of patient's response to treatment, examination of patient, obtaining history from patient or surrogate, ordering and performing treatments and interventions,  ordering and review of laboratory studies, ordering and review of radiographic studies, pulse oximetry and re-evaluation of patient's condition.  Final Clinical Impressions(s) / ED Diagnoses   Final diagnoses:  Acute upper GI bleed    ED Discharge Orders    None       Davonna Belling, MD 04/21/19 2143

## 2019-04-21 NOTE — ED Notes (Signed)
Pt unable to stand to complete orthostatic vital signs. Opyd, MD at bedside with pt.

## 2019-04-21 NOTE — H&P (Addendum)
History and Physical    Nathaniel [name] DDU:[id number] DOB: Aug 12, 1935 DOA: 04/21/2019  PCP: Shirline Frees, MD   Patient coming from: Home   Chief Complaint: Hematemesis, melena   HPI: Nathaniel [name] is a 84 y.o. male with medical history significant for rheumatoid arthritis, chronic pain, hypertension, and glaucoma with left eye blindness, now presenting to emergency department for evaluation of hematemesis and melena.  Patient reports that he went to bed last night in his usual state of health, noted some mild nausea early today, went on to have a bowel movement that he describes as very dark, and then later had one episode of vomiting dark red blood.  He feels weak and a little lightheaded when sitting up or standing, but has not passed out and denies any chest pain or palpitations.  He has never had these symptoms previously.  He denies any abdominal pain or fevers.  Reports using Aleve most days.  Denies alcohol use or history of liver disease.  Reports that he has had colonoscopy performed previously by Dr. Shana Chute but has never had EGD. Takes Plavix daily. Does not use a PPI or H-2 blocker.   ED Course: Upon arrival to the ED, patient is found to be afebrile, saturating well on room air, tachycardic to 115, and with stable blood pressure.  Chemistry panel is notable for an elevated BUN to creatinine ratio and CBC features a macrocytic anemia with hemoglobin 11.0.  There was no gross blood on DRE per the ED physician, but FOBT is positive.  Hospitalists are asked to admit.  Review of Systems:  All other systems reviewed and apart from HPI, are negative.  Past Medical History:  Diagnosis Date  . AKI (acute kidney injury) ([place]) 04/17/2018  . Altered mental status 04/17/2018  . Arthritis   . Blood transfusion 2005   s/p hip replacement  . Colon polyps   . Environmental allergies    pollen , leaves, grass  . Erectile dysfunction   . Glaucoma of left eye    legally blind in left eye  .  Hypertension     Past Surgical History:  Procedure Laterality Date  . CATARACT EXTRACTION     bilateral  . EYE SURGERY    . GLAUCOMA VALVE INSERTION  2010,2011   Left eye  . HIP ARTHROPLASTY  2005   Right Hip replacement x 3  . JOINT REPLACEMENT    . MULTIPLE TOOTH EXTRACTIONS  2012   had 23 teeth pulled  . TOENAIL EXCISION    . TOTAL KNEE ARTHROPLASTY  12/15/2011   Procedure: TOTAL KNEE ARTHROPLASTY;  Surgeon: Kerin Salen, MD;  Location: [facility];  Service: Orthopedics;  Laterality: Right;  DEPUY SIGMA      reports that he quit smoking about 20 years ago. His smoking use included cigarettes. He has a 10.00 pack-year smoking history. He has quit using smokeless tobacco.  His smokeless tobacco use included chew. He reports current alcohol use. He reports that he does not use drugs.  Allergies  Allergen Reactions  . Ibuprofen Hives    Conflict with other meds  . Tylenol [Acetaminophen] Rash    History reviewed. No pertinent family history.   Prior to Admission medications   Medication Sig Start Date End Date Taking? Authorizing Provider  baclofen (LIORESAL) 10 MG tablet Take 1 tablet (10 mg total) by mouth 2 (two) times daily. 04/18/18   Alma Friendly, MD  brimonidine (ALPHAGAN) 0.15 % ophthalmic solution Place 1 drop  into both eyes 2 (two) times daily.     [provider]  cetirizine (ZYRTEC) 10 MG tablet Take 10 mg by mouth daily.    [provider]  clopidogrel (PLAVIX) 75 MG tablet Take 1 tablet (75 mg total) by mouth daily. 04/21/15   Geradine Girt, DO  diclofenac sodium (VOLTAREN) 1 % GEL Apply 1 application topically 4 (four) times daily as needed (pain).  04/15/18   [provider]  dorzolamide-timolol (COSOPT) 22.3-6.8 MG/ML ophthalmic solution Place 1 drop into both eyes 2 (two) times daily.    [provider]  folic acid (FOLVITE) 1 MG tablet Take 1 mg by mouth at bedtime.     [provider]  gabapentin (NEURONTIN)  800 MG tablet Take 800 mg by mouth 2 (two) times daily.     [provider]  HYDROcodone-acetaminophen (NORCO) 10-325 MG per tablet Take 1 tablet by mouth every 4 (four) hours as needed. 03/09/15   [provider]  lisinopril (PRINIVIL,ZESTRIL) 20 MG tablet Take 20 mg by mouth at bedtime.     [provider]  methotrexate (RHEUMATREX) 2.5 MG tablet Take 15 mg by mouth once a week.     [provider]  predniSONE (DELTASONE) 5 MG tablet Take 5 mg by mouth daily. 03/20/18   [provider]  Travoprost, BAK Free, (TRAVATAN) 0.004 % SOLN ophthalmic solution Place 1 drop into both eyes at bedtime.    [provider]  Vitamin Mixture (VITAMIN E COMPLETE) CAPS Take 1 capsule by mouth daily as needed (for supplementation).    [provider]    Physical Exam: Vitals:   04/21/19 1838 04/21/19 1922 04/21/19 2000  BP: (!) 128/94 127/79 138/76  Pulse: (!) 115 (!) 108 (!) 107  Resp: 18 15 16   Temp: 97.8 F (36.6 C)    TempSrc: Oral    SpO2: 100% 98% 98%  Weight: 82.6 kg    Height: 5\' 11"  (1.803 m)      Constitutional: NAD, calm  Eyes: PERTLA, lids and conjunctivae normal ENMT: Mucous membranes are moist. Posterior pharynx clear of any exudate or lesions.   Neck: normal, supple, no masses, no thyromegaly Respiratory: no wheezing, no crackles. Normal respiratory effort. No accessory muscle use.  Cardiovascular: Rate ~110 and regular. No extremity edema.  Abdomen: No distension, no tenderness, soft. Bowel sounds active.  Musculoskeletal: no clubbing / cyanosis. No joint deformity upper and lower extremities.  Skin: no significant rashes, lesions, ulcers. Warm, dry, well-perfused. Neurologic: No facial asymmetry. Sensation intact. Moving all extrtemities.  Psychiatric: Alert and oriented x 3. Pleasant, cooperate.    Labs on Admission: I have personally reviewed following labs and imaging studies  CBC: Recent Labs  Lab 04/21/19 1938   WBC 7.4  HGB 11.0*  HCT 36.6*  MCV 103.4*  PLT 983   Basic Metabolic Panel: Recent Labs  Lab 04/21/19 1938  NA 144  K 3.8  CL 114*  CO2 23  GLUCOSE 147*  BUN 32*  CREATININE 0.86  CALCIUM 9.3   GFR: Estimated Creatinine Clearance: 70.5 mL/min (by C-G formula based on SCr of 0.86 mg/dL). Liver Function Tests: Recent Labs  Lab 04/21/19 1938  AST 18  ALT 15  ALKPHOS 83  BILITOT 0.6  PROT 7.2  ALBUMIN 4.0   No results for input(s): LIPASE, AMYLASE in the last 168 hours. No results for input(s): AMMONIA in the last 168 hours. Coagulation Profile: No results for input(s): INR, PROTIME in the last  168 hours. Cardiac Enzymes: No results for input(s): CKTOTAL, CKMB, CKMBINDEX, TROPONINI in the last 168 hours. BNP (last 3 results) No results for input(s): PROBNP in the last 8760 hours. HbA1C: No results for input(s): HGBA1C in the last 72 hours. CBG: No results for input(s): GLUCAP in the last 168 hours. Lipid Profile: No results for input(s): CHOL, HDL, LDLCALC, TRIG, CHOLHDL, LDLDIRECT in the last 72 hours. Thyroid Function Tests: No results for input(s): TSH, T4TOTAL, FREET4, T3FREE, THYROIDAB in the last 72 hours. Anemia Panel: No results for input(s): VITAMINB12, FOLATE, FERRITIN, TIBC, IRON, RETICCTPCT in the last 72 hours. Urine analysis:    Component Value Date/Time   COLORURINE YELLOW 04/17/2018 1202   APPEARANCEUR CLEAR 04/17/2018 1202   LABSPEC 1.017 04/17/2018 1202   PHURINE 6.0 04/17/2018 1202   GLUCOSEU NEGATIVE 04/17/2018 1202   HGBUR NEGATIVE 04/17/2018 1202   BILIRUBINUR NEGATIVE 04/17/2018 1202   KETONESUR NEGATIVE 04/17/2018 1202   PROTEINUR NEGATIVE 04/17/2018 1202   UROBILINOGEN 0.2 04/19/2015 1010   NITRITE NEGATIVE 04/17/2018 1202   LEUKOCYTESUR TRACE (A) 04/17/2018 1202   Sepsis Labs: @LABRCNTIP (procalcitonin:4,lacticidven:4) )No results found for this or any previous visit (from the past 240 hour(s)).   Radiological Exams on  Admission: No results found.  EKG: Not performed.   Assessment/Plan   1. Acute upper GI bleeding; macrocytic anemia  - Presents with one day of melena and hematemesis x1 - He is mildly tachycardic with stable BP and Hgb of 11.0  - Abdomen is soft and non-tender  - He was observed to have small amount of clear vomitus in ED, no bleeding since arrival  - Start IV PPI, keep NPO, hold Plavix, continue IVF hydration, trend H&H, consult with GI    2. Chronic combined systolic & diastolic CHF  - Appears compensated on admission and is being hydrated in setting of acute UGIB with tachycardia  - Follow daily wt and I/O's   3. Rheumatoid arthritis; chronic pain  - Managed with methotrexate, baclofen, gabapentin, Norco, Alleve, and topical Voltaren  - No pain complaints on admission   - Stop NSAID's, hold gabapentin and baclofen while lightheaded/presyncopal   4. Hypertension  - BP at goal - Hold scheduled BP meds in setting of acute GIB, treat as-needed    PPE: Mask, face shield. Patient wearing mask.  DVT prophylaxis: SCD's  Code Status: Full  Family Communication: Discussed with patient   Consults called: [facility]   Admission status: Observation     Vianne Bulls, MD [facility] Pager [phone]  If 7PM-7AM, please contact night-coverage [email or web address] Password [facility]  04/21/2019, 9:25 PM

## 2019-04-22 ENCOUNTER — Encounter (HOSPITAL_COMMUNITY)
Admission: EM | Disposition: A | Discharge status: Home / Self Care | Payer: Self-pay | Source: Home / Self Care | Attending: Internal Medicine

## 2019-04-22 ENCOUNTER — Observation Stay (HOSPITAL_COMMUNITY): Payer: Medicare Other | Admitting: Certified Registered"

## 2019-04-22 ENCOUNTER — Encounter (HOSPITAL_COMMUNITY): Payer: Self-pay

## 2019-04-22 DIAGNOSIS — G8929 Other chronic pain: Secondary | ICD-10-CM | POA: Diagnosis present

## 2019-04-22 DIAGNOSIS — E162 Hypoglycemia, unspecified: Secondary | ICD-10-CM | POA: Diagnosis present

## 2019-04-22 DIAGNOSIS — D539 Nutritional anemia, unspecified: Secondary | ICD-10-CM | POA: Diagnosis present

## 2019-04-22 DIAGNOSIS — Z86010 Personal history of colonic polyps: Secondary | ICD-10-CM | POA: Diagnosis not present

## 2019-04-22 DIAGNOSIS — H5462 Unqualified visual loss, left eye, normal vision right eye: Secondary | ICD-10-CM | POA: Diagnosis present

## 2019-04-22 DIAGNOSIS — K920 Hematemesis: Secondary | ICD-10-CM | POA: Diagnosis not present

## 2019-04-22 DIAGNOSIS — I110 Hypertensive heart disease with heart failure: Secondary | ICD-10-CM | POA: Diagnosis present

## 2019-04-22 DIAGNOSIS — K449 Diaphragmatic hernia without obstruction or gangrene: Secondary | ICD-10-CM | POA: Diagnosis present

## 2019-04-22 DIAGNOSIS — K219 Gastro-esophageal reflux disease without esophagitis: Secondary | ICD-10-CM | POA: Diagnosis present

## 2019-04-22 DIAGNOSIS — K921 Melena: Secondary | ICD-10-CM | POA: Diagnosis not present

## 2019-04-22 DIAGNOSIS — Z1159 Encounter for screening for other viral diseases: Secondary | ICD-10-CM | POA: Diagnosis not present

## 2019-04-22 DIAGNOSIS — D696 Thrombocytopenia, unspecified: Secondary | ICD-10-CM | POA: Diagnosis present

## 2019-04-22 DIAGNOSIS — J302 Other seasonal allergic rhinitis: Secondary | ICD-10-CM | POA: Diagnosis present

## 2019-04-22 DIAGNOSIS — K922 Gastrointestinal hemorrhage, unspecified: Secondary | ICD-10-CM | POA: Diagnosis present

## 2019-04-22 DIAGNOSIS — Z96641 Presence of right artificial hip joint: Secondary | ICD-10-CM | POA: Diagnosis present

## 2019-04-22 DIAGNOSIS — D62 Acute posthemorrhagic anemia: Secondary | ICD-10-CM | POA: Diagnosis present

## 2019-04-22 DIAGNOSIS — R944 Abnormal results of kidney function studies: Secondary | ICD-10-CM | POA: Diagnosis present

## 2019-04-22 DIAGNOSIS — I5042 Chronic combined systolic (congestive) and diastolic (congestive) heart failure: Secondary | ICD-10-CM | POA: Diagnosis present

## 2019-04-22 DIAGNOSIS — H409 Unspecified glaucoma: Secondary | ICD-10-CM | POA: Diagnosis present

## 2019-04-22 DIAGNOSIS — Z7902 Long term (current) use of antithrombotics/antiplatelets: Secondary | ICD-10-CM | POA: Diagnosis not present

## 2019-04-22 DIAGNOSIS — Z96659 Presence of unspecified artificial knee joint: Secondary | ICD-10-CM | POA: Diagnosis present

## 2019-04-22 DIAGNOSIS — M069 Rheumatoid arthritis, unspecified: Secondary | ICD-10-CM | POA: Diagnosis present

## 2019-04-22 DIAGNOSIS — K222 Esophageal obstruction: Secondary | ICD-10-CM | POA: Diagnosis present

## 2019-04-22 DIAGNOSIS — Z8673 Personal history of transient ischemic attack (TIA), and cerebral infarction without residual deficits: Secondary | ICD-10-CM | POA: Diagnosis not present

## 2019-04-22 DIAGNOSIS — K31811 Angiodysplasia of stomach and duodenum with bleeding: Secondary | ICD-10-CM | POA: Diagnosis present

## 2019-04-22 HISTORY — PX: ESOPHAGOGASTRODUODENOSCOPY (EGD) WITH PROPOFOL: SHX5813

## 2019-04-22 HISTORY — PX: HOT HEMOSTASIS: SHX5433

## 2019-04-22 LAB — BASIC METABOLIC PANEL
Anion gap: 7 (ref 5–15)
BUN: 31 mg/dL — ABNORMAL HIGH (ref 8–23)
CO2: 20 mmol/L — ABNORMAL LOW (ref 22–32)
Calcium: 8.8 mg/dL — ABNORMAL LOW (ref 8.9–10.3)
Chloride: 118 mmol/L — ABNORMAL HIGH (ref 98–111)
Creatinine, Ser: 0.86 mg/dL (ref 0.61–1.24)
GFR calc Af Amer: >60 mL/min (ref >60)
GFR calc non Af Amer: >60 mL/min (ref >60)
Glucose, Bld: 114 mg/dL — ABNORMAL HIGH (ref 70–99)
Potassium: 3.9 mmol/L (ref 3.5–5.1)
Sodium: 145 mmol/L (ref 135–145)

## 2019-04-22 LAB — HEMATOCRIT
HCT: 30.4 % — ABNORMAL LOW (ref 39.0–52.0)
HCT: 33 % — ABNORMAL LOW (ref 39.0–52.0)
HCT: 33.6 % — ABNORMAL LOW (ref 39.0–52.0)

## 2019-04-22 LAB — HEMOGLOBIN
Hemoglobin: 10.6 g/dL — ABNORMAL LOW (ref 13.0–17.0)
Hemoglobin: 9.3 g/dL — ABNORMAL LOW (ref 13.0–17.0)
Hemoglobin: 9.5 g/dL — ABNORMAL LOW (ref 13.0–17.0)

## 2019-04-22 LAB — SARS CORONAVIRUS 2 BY RT PCR (HOSPITAL ORDER, PERFORMED IN ~~LOC~~ HOSPITAL LAB): SARS Coronavirus 2: NEGATIVE

## 2019-04-22 LAB — ABO/RH: ABO/RH(D): B POS

## 2019-04-22 LAB — GLUCOSE, CAPILLARY: Glucose-Capillary: 90 mg/dL (ref 70–99)

## 2019-04-22 SURGERY — ESOPHAGOGASTRODUODENOSCOPY (EGD) WITH PROPOFOL
Anesthesia: Monitor Anesthesia Care

## 2019-04-22 MED ORDER — PROPOFOL 10 MG/ML IV BOLUS
INTRAVENOUS | Status: DC | PRN
  Administered 2019-04-22: 10 mg via INTRAVENOUS

## 2019-04-22 MED ORDER — GABAPENTIN 800 MG PO TABS
800.0000 mg | ORAL_TABLET | Freq: Two times a day (BID) | ORAL | Status: DC
  Filled 2019-04-22: qty 1

## 2019-04-22 MED ORDER — FOLIC ACID 1 MG PO TABS
1.0000 mg | ORAL_TABLET | Freq: Every day | ORAL | Status: DC
  Administered 2019-04-22 – 2019-04-23 (×2): 1 mg via ORAL
  Filled 2019-04-22 (×2): qty 1

## 2019-04-22 MED ORDER — BRIMONIDINE TARTRATE 0.15 % OP SOLN
1.0000 [drp] | Freq: Two times a day (BID) | OPHTHALMIC | Status: DC
  Administered 2019-04-23 – 2019-04-24 (×3): 1 [drp] via OPHTHALMIC
  Filled 2019-04-22 (×2): qty 5

## 2019-04-22 MED ORDER — HYDROCODONE-ACETAMINOPHEN 10-325 MG PO TABS: 1.0000 | ORAL_TABLET | Freq: Every evening | ORAL | Status: DC | PRN

## 2019-04-22 MED ORDER — LORATADINE 10 MG PO TABS
10.0000 mg | ORAL_TABLET | Freq: Every day | ORAL | Status: DC
  Administered 2019-04-23 – 2019-04-24 (×2): 10 mg via ORAL
  Filled 2019-04-22 (×2): qty 1

## 2019-04-22 MED ORDER — PROPOFOL 10 MG/ML IV BOLUS
INTRAVENOUS | Status: AC
  Filled 2019-04-22: qty 40

## 2019-04-22 MED ORDER — PROMETHAZINE HCL 25 MG/ML IJ SOLN
12.5000 mg | Freq: Four times a day (QID) | INTRAMUSCULAR | Status: DC | PRN
  Administered 2019-04-22: 12.5 mg via INTRAVENOUS
  Filled 2019-04-22: qty 1

## 2019-04-22 MED ORDER — PROPOFOL 500 MG/50ML IV EMUL
INTRAVENOUS | Status: DC | PRN
  Administered 2019-04-22: 13:00:00 via INTRAVENOUS
  Administered 2019-04-22: 125 ug/kg/min via INTRAVENOUS

## 2019-04-22 MED ORDER — LACTATED RINGERS IV SOLN
INTRAVENOUS | Status: DC
  Administered 2019-04-22 – 2019-04-23 (×3): via INTRAVENOUS

## 2019-04-22 MED ORDER — VITAMIN E 45 MG (100 UNIT) PO CAPS
100.0000 [IU] | ORAL_CAPSULE | Freq: Every day | ORAL | Status: DC | PRN
  Filled 2019-04-22: qty 1

## 2019-04-22 MED ORDER — GABAPENTIN 400 MG PO CAPS
800.0000 mg | ORAL_CAPSULE | Freq: Two times a day (BID) | ORAL | Status: DC
  Administered 2019-04-22 – 2019-04-24 (×4): 800 mg via ORAL
  Filled 2019-04-22 (×4): qty 2

## 2019-04-22 MED ORDER — BACLOFEN 10 MG PO TABS
10.0000 mg | ORAL_TABLET | Freq: Two times a day (BID) | ORAL | Status: DC
  Administered 2019-04-22 – 2019-04-24 (×4): 10 mg via ORAL
  Filled 2019-04-22 (×4): qty 1

## 2019-04-22 SURGICAL SUPPLY — 14 items

## 2019-04-22 NOTE — Transfer of Care (Signed)
Immediate Anesthesia Transfer of Care Note  Patient: Nathaniel [name]  Procedure(s) Performed: ESOPHAGOGASTRODUODENOSCOPY (EGD) WITH PROPOFOL (N/A )  Patient Location: PACU  Anesthesia Type:MAC  Level of Consciousness: awake, alert  and oriented  Airway & Oxygen Therapy: Patient Spontanous Breathing and Patient connected to nasal cannula oxygen  Post-op Assessment: Report given to RN and Post -op Vital signs reviewed and stable  Post vital signs: Reviewed and stable  Last Vitals:  Vitals Value Taken Time  BP 127/74 04/22/19 1254  Temp    Pulse 88 04/22/19 1254  Resp 22 04/22/19 1255  SpO2    Vitals shown include unvalidated device data.  Last Pain:  Vitals:   04/22/19 1254  TempSrc:   PainSc: 0-No pain         Complications: No apparent anesthesia complications

## 2019-04-22 NOTE — Progress Notes (Signed)
   04/22/19 1000  Clinical Encounter Type  Visited With Patient  Visit Type Initial;Spiritual support  Referral From Nurse  Consult/Referral To [place]  This chaplain responded to consult for Pt. prayer. The Pt. accepted the chaplain's pastoral presence with a few minutes of listening  before the Pt. expressed his desire for rest. The chaplain ended the visit with prayer. This chaplain is available for F/U spiritual care as needed.

## 2019-04-22 NOTE — Op Note (Addendum)
[facility] Patient Name: Nathaniel [name] Procedure Date: 04/22/2019 MRN: [id number] Attending MD: Ladene Artist , MD Date of Birth: 10/25/1935 CSN: [id number] Age: 84 Admit Type: Inpatient Procedure:                Upper GI endoscopy Indications:              Hematemesis, Melena, Suspected upper                            gastrointestinal bleeding Providers:                Pricilla Riffle. Fuller Plan, MD, Elmer Ramp. Tilden Dome, RN,                            William Dalton, Technician Referring MD:             [facility] Medicines:                Monitored Anesthesia Care Complications:            No immediate complications. Estimated Blood Loss:     Estimated blood loss was minimal. Procedure:                Pre-Anesthesia Assessment:                           - Prior to the procedure, a History and Physical                            was performed, and patient medications and                            allergies were reviewed. The patient's tolerance of                            previous anesthesia was also reviewed. The risks                            and benefits of the procedure and the sedation                            options and risks were discussed with the patient.                            All questions were answered, and informed consent                            was obtained. Prior Anticoagulants: The patient has                            taken Plavix (clopidogrel), last dose was 2 days                            prior to procedure. ASA Grade Assessment: III - A  patient with severe systemic disease. After                            reviewing the risks and benefits, the patient was                            deemed in satisfactory condition to undergo the                            procedure.                           After obtaining informed consent, the endoscope was                            passed under direct vision.  Throughout the                            procedure, the patient's blood pressure, pulse, and                            oxygen saturations were monitored continuously. The                            GIF-H190 ([id number] Olympus gastroscope was                            introduced through the mouth, and advanced to the                            second part of duodenum. The upper GI endoscopy was                            accomplished without difficulty. The patient                            tolerated the procedure well. Scope In: Scope Out: Findings:      One benign-appearing, intrinsic moderate stenosis was found at the       gastroesophageal junction. This stenosis measured 1.4 cm (inner       diameter). The stenosis was traversed.      The exam of the esophagus was otherwise normal.      A single 3 mm angiodysplastic lesion with no bleeding was found in the       gastric fundus. Coagulation to prevent bleeding by argon plasma was       successful.      Hematin and solid (altered blood/coffee-ground-like material) and solid       food was found in the gastric fundus, in the gastric body and in the       gastric antrum which significantly limited the exam with several areas       not seen.      A small hiatal hernia was present.      The exam of the stomach was otherwise normal with limited views as       outlined above.      Three  2 to 5 mm angiodysplastic lesions with bleeding on contact from       the 5 mm angiodysplasia were found in the duodenal bulb. Coagulation for       bleeding prevention using argon plasma was successful.      The second portion of the duodenum was normal. Impression:               - Benign-appearing esophageal stenosis.                           - A single non-bleeding angiodysplastic lesion in                            the stomach. Treated with argon plasma coagulation                            (APC).                           - Hematin (altered  blood/coffee-ground-like                            material) and solid food in the gastric fundus, in                            the gastric body and in the gastric antrum.                           - Small hiatal hernia.                           - Three angiodysplastic lesions in the duodenum.                            Treated with argon plasma coagulation (APC).                           - Normal second portion of the duodenum.                           - No specimens collected. Moderate Sedation:      Not Applicable - Patient had care per Anesthesia. Recommendation:           - Return patient to hospital ward for ongoing care.                           - Clear liquid diet today.                           - Continue present medications.                           - Continue pantoprazole infusion for at least                            another 24 hours and then change to pantoprazole 40  mg po qd for 1 month post APC and then long term                            for GERD, esophageal stricture.                           - No aspirin, ibuprofen, naproxen, or other                            non-steroidal anti-inflammatory drugs.                           - Resume Plavix (clopidogrel) at prior dose no                            sooner than 5 days from now. Refer to managing                            physician for further adjustment of therapy.                           - GI follow up with Acquanetta Sit, MD in 1 month. Procedure Code(s):        --- Professional ---                           [phone], Esophagogastroduodenoscopy, flexible,                            transoral; with control of bleeding, any method Diagnosis Code(s):        --- Professional ---                           K22.2, Esophageal obstruction                           K31.819, Angiodysplasia of stomach and duodenum                            without bleeding                           K92.2,  Gastrointestinal hemorrhage, unspecified                           K92.0, Hematemesis                           K92.1, Melena (includes Hematochezia) CPT copyright 2019 American [employer] Association. All rights reserved. The codes documented in this report are preliminary and upon coder review may  be revised to meet current compliance requirements. Ladene Artist, MD 04/22/2019 1:14:06 PM This report has been signed electronically. Number of Addenda: 0

## 2019-04-22 NOTE — Plan of Care (Signed)
Pt unable to stand in ED for ortho VS. We will try again in the am after rest and fluid resuscitation.

## 2019-04-22 NOTE — Progress Notes (Signed)
PROGRESS NOTE    Nathaniel [name]  CZY:[id number] DOB: 01-21-1935 DOA: 04/21/2019 PCP: Shirline Frees, MD   Brief Narrative:  HPI per Dr. Mitzi Hansen on 04/21/2019 Nathaniel [name] is a 84 y.o. male with medical history significant for rheumatoid arthritis, chronic pain, hypertension, and glaucoma with left eye blindness, now presenting to emergency department for evaluation of hematemesis and melena.  Patient reports that he went to bed last night in his usual state of health, noted some mild nausea early today, went on to have a bowel movement that he describes as very dark, and then later had one episode of vomiting dark red blood.  He feels weak and a little lightheaded when sitting up or standing, but has not passed out and denies any chest pain or palpitations.  He has never had these symptoms previously.  He denies any abdominal pain or fevers.  Reports using Aleve most days.  Denies alcohol use or history of liver disease.  Reports that he has had colonoscopy performed previously by Dr. Shana Chute but has never had EGD. Takes Plavix daily. Does not use a PPI or H-2 blocker.   ED Course: Upon arrival to the ED, patient is found to be afebrile, saturating well on room air, tachycardic to 115, and with stable blood pressure.  Chemistry panel is notable for an elevated BUN to creatinine ratio and CBC features a macrocytic anemia with hemoglobin 11.0.  There was no gross blood on DRE per the ED physician, but FOBT is positive.  Hospitalists are asked to admit.  **Interim History Gastroenterology was consulted and patient went down for an EGD done and showed a benign-appearing esophageal stenosis as well as a single nonbleeding angiodysplastic lesion in the stomach which was treated with argon plasma as well as 3 angiodysplastic lesions in the duodenum which was treated with argon plasma coagulation.  GI recommended continuing Protonix drip for now and then transition to p.o. Protonix daily afterwards and  holding Plavix for at least 5 days  Assessment & Plan:   Principal Problem:   Acute upper GI bleed Active Problems:   Acute upper GI bleeding   Rheumatoid arthritis (HCC)   Chronic pain   Chronic combined systolic and diastolic CHF (congestive heart failure) (HCC)   Macrocytic anemia  Acute upper GI bleeding in the setting of Clopidogrel and NSAID Usage  Macrocytic anemia  -Presented with one day of melena and hematemesis x1 -He is mildly tachycardic with stable BP and Hgb of 11.0 -Hgb/Hct went from 11.0/36.6 -> 10.6/33.6 -> 9.3/20.4 - Abdomen is soft and non-tender  -He was observed to have small amount of clear vomitus in ED, no bleeding since arrival  -Started on Protonix drip and will continue for now until GI transitions to p.o. Protonix 40 mg p.o. daily -Gastroenterology took the patient up for an EGD which showed a single angiodysplastic nonbleeding lesion in the stomach which was treated with argon plasma as well as 3 angiodysplastic lesion in the duodenum which was also treated with argon plasma coagulation -GI recommending clear liquid diet for now, holding Plavix for at least 5 days as well as follow-up with gastroenterology within 1 month -We also recommending avoiding NSAIDs, aspirin, ibuprofen naproxen, as well as others nonsteroidal drugs  Chronic combined systolic & diastolic CHF  -Appears compensated on admission and is being hydrated in setting of acute UGIB with tachycardia  -Follow daily wt and strict I/O's  -Patient is +1.056 L since admission -Fluids were changed to lactated Ringer's  at 63 mL's per hour and will stop later on  Rheumatoid Arthritis; chronic pain  -Managed at home with methotrexate, baclofen, gabapentin, Norco, Alleve, and topical Voltaren at home  -Currently holding all his medications bruised resume gabapentin and baclofen as well as Norco for now but will hold if becomes pre-syncopal -No pain complaints on admission   -Stop NSAID's,    Hypertension  -BP at goal -Currently holding Lisinopril 20 mg p.o. qHS -Hold scheduled BP meds in setting of acute GIB, treat as-needed   Glaucoma  -Continue with dorzolamide-timolol 22.3-6.8 mg/mL 1 drop both eyes twice daily per ophthalmic solution along with latanoprost 3.005% ophthalmic solution 1 drop in both eyes nightly -Resumed home brimonidine 0.15% ophthalmic solution 1 drop in both eyes twice daily  Seasonal Allergies  -Continue with sertraline substitution with loratadine 10 mg p.o. daily  DVT prophylaxis: SCDs Code Status: FULL CODE  Family Communication:  Disposition Plan:   Consultants:   [facility]    Procedures:  EGD Findings:      One benign-appearing, intrinsic moderate stenosis was found at the       gastroesophageal junction. This stenosis measured 1.4 cm (inner       diameter). The stenosis was traversed.      The exam of the esophagus was otherwise normal.      A single 3 mm angiodysplastic lesion with no bleeding was found in the       gastric fundus. Coagulation to prevent bleeding by argon plasma was       successful.      Hematin and solid (altered blood/coffee-ground-like material) and solid       food was found in the gastric fundus, in the gastric body and in the       gastric antrum which significantly limited the exam with several areas       not seen.      A small hiatal hernia was present.      The exam of the stomach was otherwise normal with limited views as       outlined above.      Three 2 to 5 mm angiodysplastic lesions with bleeding on contact from       the 5 mm angiodysplasia were found in the duodenal bulb. Coagulation for       bleeding prevention using argon plasma was successful.      The second portion of the duodenum was normal. Impression:               - Benign-appearing esophageal stenosis.                           - A single non-bleeding angiodysplastic lesion in                            the  stomach. Treated with argon plasma coagulation                            (APC).                           - Hematin (altered blood/coffee-ground-like                            material) and solid food in the gastric fundus,  in                            the gastric body and in the gastric antrum.                           - Small hiatal hernia.                           - Three angiodysplastic lesions in the duodenum.                            Treated with argon plasma coagulation (APC).                           - Normal second portion of the duodenum.                           - No specimens collected.  Antimicrobials:  Anti-infectives (From admission, onward)   None     Subjective: Seen and examined at bedside prior to ED and complaining of some nausea.  No vomiting.  No chest pain, lightheadedness or dizziness and states that he had a bloody bowel movement yesterday.  No other concerns or complaints at this time.  Objective: Vitals:   04/22/19 0006 04/22/19 0500 04/22/19 0654 04/22/19 1119  BP: (!) 134/95  (!) 142/78 (!) 163/76  Pulse: (!) 102  90 91  Resp: 18  16 (!) 21  Temp: 98.4 F (36.9 C)  98.6 F (37 C)   TempSrc: Oral  Oral Oral  SpO2: 95%  99% 99%  Weight:  83.5 kg    Height:        Intake/Output Summary (Last 24 hours) at 04/22/2019 1135 Last data filed at 04/22/2019 0600 Gross per 24 hour  Intake 656.58 ml  Output -  Net 656.58 ml   Filed Weights   04/21/19 1838 04/21/19 2243 04/22/19 0500  Weight: 82.6 kg 83.7 kg 83.5 kg   Examination: Physical Exam:  Constitutional: Thin elderly African-American male currently in NAD and appears calm and comfortable Eyes: Lids and conjunctivae normal, sclerae anicteric  ENMT: External Ears, Nose appear normal. Grossly normal hearing. Mucous membranes are moist.  Neck: Appears normal, supple, no cervical masses, normal ROM, no appreciable thyromegaly; no JVD Respiratory: Diminished to auscultation bilaterally, no  wheezing, rales, rhonchi or crackles. Normal respiratory effort and patient is not tachypenic. No accessory muscle use.  Cardiovascular: RRR, no murmurs / rubs / gallops. S1 and S2 auscultated. No appreciable edema.  Abdomen: Soft, mildly tender, non-distended. No masses palpated. No appreciable hepatosplenomegaly. Bowel sounds positive x4.  GU: Deferred. Musculoskeletal: No clubbing / cyanosis of digits/nails. No joint deformity upper and lower extremities.  Skin: No rashes, lesions, ulcers on a limited skin evaluation. No induration; Warm and dry.  Neurologic: CN 2-12 grossly intact with no focal deficits. Romberg sign and cerebellar reflexes not assessed.  Psychiatric: Normal judgment and insight. Alert and oriented x 3. Anxious mood and appropriate affect.   Data Reviewed: I have personally reviewed following labs and imaging studies  CBC: Recent Labs  Lab 04/21/19 1938 04/22/19 0011 04/22/19 0834  WBC 7.4  --   --   HGB 11.0* 10.6* 9.3*  HCT 36.6* 33.6* 30.4*  MCV  103.4*  --   --   PLT 188  --   --    Basic Metabolic Panel: Recent Labs  Lab 04/21/19 1938 04/22/19 0011  NA 144 145  K 3.8 3.9  CL 114* 118*  CO2 23 20*  GLUCOSE 147* 114*  BUN 32* 31*  CREATININE 0.86 0.86  CALCIUM 9.3 8.8*   GFR: Estimated Creatinine Clearance: 74.8 mL/min (by C-G formula based on SCr of 0.86 mg/dL). Liver Function Tests: Recent Labs  Lab 04/21/19 1938  AST 18  ALT 15  ALKPHOS 83  BILITOT 0.6  PROT 7.2  ALBUMIN 4.0   No results for input(s): LIPASE, AMYLASE in the last 168 hours. No results for input(s): AMMONIA in the last 168 hours. Coagulation Profile: No results for input(s): INR, PROTIME in the last 168 hours. Cardiac Enzymes: No results for input(s): CKTOTAL, CKMB, CKMBINDEX, TROPONINI in the last 168 hours. BNP (last 3 results) No results for input(s): PROBNP in the last 8760 hours. HbA1C: No results for input(s): HGBA1C in the last 72 hours. CBG: Recent Labs   Lab 04/22/19 0728  GLUCAP 90   Lipid Profile: No results for input(s): CHOL, HDL, LDLCALC, TRIG, CHOLHDL, LDLDIRECT in the last 72 hours. Thyroid Function Tests: No results for input(s): TSH, T4TOTAL, FREET4, T3FREE, THYROIDAB in the last 72 hours. Anemia Panel: No results for input(s): VITAMINB12, FOLATE, FERRITIN, TIBC, IRON, RETICCTPCT in the last 72 hours. Sepsis Labs: No results for input(s): PROCALCITON, LATICACIDVEN in the last 168 hours.  Recent Results (from the past 240 hour(s))  SARS Coronavirus 2 (CEPHEID - Performed in [place] hospital lab), Hosp Order     Status: None   Collection Time: 04/22/19  8:47 AM   Specimen: Nasopharyngeal Swab  Result Value Ref Range Status   SARS Coronavirus 2 NEGATIVE NEGATIVE Final    Comment: (NOTE) If result is NEGATIVE SARS-CoV-2 target nucleic acids are NOT DETECTED. The SARS-CoV-2 RNA is generally detectable in upper and lower  respiratory specimens during the acute phase of infection. The lowest  concentration of SARS-CoV-2 viral copies this assay can detect is 250  copies / mL. A negative result does not preclude SARS-CoV-2 infection  and should not be used as the sole basis for treatment or other  patient management decisions.  A negative result may occur with  improper specimen collection / handling, submission of specimen other  than nasopharyngeal swab, presence of viral mutation(s) within the  areas targeted by this assay, and inadequate number of viral copies  (<250 copies / mL). A negative result must be combined with clinical  observations, patient history, and epidemiological information. If result is POSITIVE SARS-CoV-2 target nucleic acids are DETECTED. The SARS-CoV-2 RNA is generally detectable in upper and lower  respiratory specimens dur ing the acute phase of infection.  Positive  results are indicative of active infection with SARS-CoV-2.  Clinical  correlation with patient history and other diagnostic  information is  necessary to determine patient infection status.  Positive results do  not rule out bacterial infection or co-infection with other viruses. If result is PRESUMPTIVE POSTIVE SARS-CoV-2 nucleic acids MAY BE PRESENT.   A presumptive positive result was obtained on the submitted specimen  and confirmed on repeat testing.  While 2019 novel coronavirus  (SARS-CoV-2) nucleic acids may be present in the submitted sample  additional confirmatory testing may be necessary for epidemiological  and / or clinical management purposes  to differentiate between  SARS-CoV-2 and other Sarbecovirus currently known to  infect humans.  If clinically indicated additional testing with an alternate test  methodology [phone]) is advised. The SARS-CoV-2 RNA is generally  detectable in upper and lower respiratory sp ecimens during the acute  phase of infection. The expected result is Negative. Fact Sheet for Patients:  StrictlyIdeas.no Fact Sheet for Healthcare Providers: BankingDealers.co.za This test is not yet approved or cleared by the [place] FDA and has been authorized for detection and/or diagnosis of SARS-CoV-2 by FDA under an Emergency Use Authorization (EUA).  This EUA will remain in effect (meaning this test can be used) for the duration of the COVID-19 declaration under Section 564(b)(1) of the Act, 21 U.S.C. section 360bbb-3(b)(1), unless the authorization is terminated or revoked sooner. Performed at [facility], [place] [address], [place], [place] [address]     Radiology Studies: No results found.  Scheduled Meds: . [MAR Hold] dorzolamide-timolol  1 drop Both Eyes BID  . [MAR Hold] latanoprost  1 drop Both Eyes QHS  . [MAR Hold] pantoprazole  40 mg Intravenous Q12H  . [MAR Hold] sodium chloride flush  3 mL Intravenous Q12H   Continuous Infusions: . lactated ringers 75 mL/hr at 04/22/19 0942  .  pantoprozole (PROTONIX) infusion 8 mg/hr (04/21/19 2331)    LOS: 0 days   Kerney Elbe, DO [facility] PAGER is on [place]  If 7PM-7AM, please contact night-coverage [email or web address] Password [facility] 04/22/2019, 11:35 AM

## 2019-04-22 NOTE — Anesthesia Procedure Notes (Signed)
Procedure Name: MAC Date/Time: 04/22/2019 12:28 PM Performed by: Niel Hummer, CRNA Pre-anesthesia Checklist: Patient being monitored, Suction available, Emergency Drugs available and Patient identified Patient Re-evaluated:Patient Re-evaluated prior to induction Oxygen Delivery Method: Nasal cannula

## 2019-04-22 NOTE — Anesthesia Postprocedure Evaluation (Signed)
Anesthesia Post Note  Patient: [name]  Procedure(s) Performed: ESOPHAGOGASTRODUODENOSCOPY (EGD) WITH PROPOFOL (N/A ) HOT HEMOSTASIS (ARGON PLASMA COAGULATION/BICAP) (N/A )     Patient location during evaluation: Endoscopy Anesthesia Type: MAC Level of consciousness: awake and alert Pain management: pain level controlled Vital Signs Assessment: post-procedure vital signs reviewed and stable Respiratory status: spontaneous breathing, nonlabored ventilation and respiratory function stable Cardiovascular status: stable and blood pressure returned to baseline Postop Assessment: no apparent nausea or vomiting Anesthetic complications: no    Last Vitals:  Vitals:   04/22/19 1305 04/22/19 1340  BP: (!) 152/70 (!) 145/76  Pulse:  79  Resp: (!) 22 20  Temp:  36.6 C  SpO2: 100% 100%    Last Pain:  Vitals:   04/22/19 1340  TempSrc: Oral  PainSc:                  Lynda Rainwater

## 2019-04-22 NOTE — Consult Note (Addendum)
Referring Provider:  [facility]         Primary Care Physician:  Shirline Frees, MD Primary Gastroenterologist: Acquanetta Sit, MD [facility])           Reason for Consultation:    GI bleed              ASSESSMENT /  PLAN    1. 84 yo male admitted with UGI bleeding in setting of Plavix and NSAIDS. BUN elevated at 32.  -agree with PPI gtt -trend hgb -normal coags -Patient scheduled for EGD. Patient oriented, answers questions appropriately but I still wanted to discuss procedure with his wife. Risks and  benefits of EGD were discussed with the patient and his wife. Patient and his wife agree to proceed with the procedure.   2. ABL anemia with macrocytosis.  Hgb 10.3 yesterday, 9.3 today. Baseline hgb 13.  -check folate ( it was normal in 400 range a year ago) -check B12 level -Occasional shot of liquor per wife (1-2 x / week).  3. Hx of CVA, on Plavix.      Attending Physician Note   I have taken a history, examined the patient and reviewed the chart. I agree with the Advanced Practitioner's note, impression and recommendations.   Hematemesis and melena with NSAID use and Plavix. R/O ulcer, gastritis, MW tear ABL anemia  PPI infusion Trend CBC Hold Plavix EGD today  Lucio Edward, MD [facility] [phone]     BRIEF HISTORY   Nathaniel [name] is a 84 y.o. male with a history of HTN, combined systolic and diastolic heart failure, CVA on plavix, glaucoma, left blindness, hypertension, arthritis ( ? RA) on methotrexate and NSAIDs, right hip replacement, and history of adenomatous colon polyps followed by [facility].  Patient admitted through the ED yesterday for upper gastrointestinal bleeding.   ED EVALUATION   Tachycardic, normotensive, afebrile.  BUN 32, Cr 0.86 Normal liver tests hgb 10.3 (baseline 13) MCV 103.4 INR 1.06  HPI:   Nathaniel [name] was brought to ED yesterday after vomiting dark red blood and passing blood in stool.  He was hemodynamically stable.  Patient believes he had a  couple of episodes of hematemesis and melena yesterday.  No associated abdominal pain.  No gross blood on EDP's exam but he was FOBT positive.  Hemoglobin has steadily declined since admission yesterday.  He presented with a hemoglobin of 11, it declined to 9.6 and now at 9.3 today.  His baseline hemoglobin is around 13. Patient felt fine prior to the onset of these symptoms.  Patient believes he had a gastric ulcer many, many years ago.  He takes Aleve QOD for arthritis.  PREVIOUS GASTROINTESTINAL STUDIES   Colonoscopy 2013 at [facility] >>> TA Colonoscopy Jan 2017 [facility] >>> TA  Past Medical History:  Diagnosis Date  . AKI (acute kidney injury) ([place]) 04/17/2018  . Altered mental status 04/17/2018  . Arthritis   . Blood transfusion 2005   s/p hip replacement  . Colon polyps   . Environmental allergies    pollen , leaves, grass  . Erectile dysfunction   . Glaucoma of left eye    legally blind in left eye  . Hypertension     Past Surgical History:  Procedure Laterality Date  . CATARACT EXTRACTION     bilateral  . EYE SURGERY    . GLAUCOMA VALVE INSERTION  2010,2011   Left eye  . HIP ARTHROPLASTY  2005   Right Hip replacement x  3  . JOINT REPLACEMENT    . MULTIPLE TOOTH EXTRACTIONS  2012   had 23 teeth pulled  . TOENAIL EXCISION    . TOTAL KNEE ARTHROPLASTY  12/15/2011   Procedure: TOTAL KNEE ARTHROPLASTY;  Surgeon: Kerin Salen, MD;  Location: [place];  Service: Orthopedics;  Laterality: Right;  DEPUY SIGMA     Prior to Admission medications   Medication Sig Start Date End Date Taking? Authorizing Provider  baclofen (LIORESAL) 10 MG tablet Take 1 tablet (10 mg total) by mouth 2 (two) times daily. 04/18/18  Yes Alma Friendly, MD  brimonidine (ALPHAGAN) 0.15 % ophthalmic solution Place 1 drop into both eyes 2 (two) times daily.    Yes [provider]  cetirizine (ZYRTEC) 10 MG tablet Take 10 mg by mouth daily.   Yes [provider]  clopidogrel  (PLAVIX) 75 MG tablet Take 1 tablet (75 mg total) by mouth daily. 04/21/15  Yes Eulogio Bear U, DO  diclofenac sodium (VOLTAREN) 1 % GEL Apply 1 application topically 4 (four) times daily as needed (pain).  04/15/18  Yes [provider]  dorzolamide-timolol (COSOPT) 22.3-6.8 MG/ML ophthalmic solution Place 1 drop into both eyes 2 (two) times daily.   Yes [provider]  FIBER ADULT GUMMIES PO Take 1 tablet by mouth daily.   Yes [provider]  gabapentin (NEURONTIN) 800 MG tablet Take 800 mg by mouth 2 (two) times daily.    Yes [provider]  HYDROcodone-acetaminophen (NORCO) 10-325 MG per tablet Take 1 tablet by mouth at bedtime as needed for moderate pain.  03/09/15  Yes [provider]  lisinopril (PRINIVIL,ZESTRIL) 20 MG tablet Take 20 mg by mouth at bedtime.    Yes [provider]  methotrexate 50 MG/2ML injection Inject 50 mg/m2 into the vein once a week.   Yes [provider]  Travoprost, BAK Free, (TRAVATAN) 0.004 % SOLN ophthalmic solution Place 1 drop into both eyes at bedtime.   Yes [provider]  TURMERIC PO Take 1 capsule by mouth daily.   Yes [provider]  Vitamin Mixture (VITAMIN E COMPLETE) CAPS Take 1 capsule by mouth daily as needed (for supplementation).   Yes [provider]  folic acid (FOLVITE) 1 MG tablet Take 1 mg by mouth at bedtime.     [provider]    Current Facility-Administered Medications  Medication Dose Route Frequency Provider Last Rate Last Dose  . dorzolamide-timolol (COSOPT) 22.3-6.8 MG/ML ophthalmic solution 1 drop  1 drop Both Eyes BID Opyd, Ilene Qua, MD   1 drop at 04/21/19 2339  . lactated ringers infusion   Intravenous Continuous Sheikh, Omair Latif, DO      . latanoprost (XALATAN) 0.005 % ophthalmic solution 1 drop  1 drop Both Eyes QHS Opyd, Ilene Qua, MD   1 drop at 04/21/19 2340  . ondansetron (ZOFRAN) tablet 4 mg  4 mg Oral Q6H PRN Opyd,  Ilene Qua, MD       Or  . ondansetron (ZOFRAN) injection 4 mg  4 mg Intravenous Q6H PRN Opyd, Ilene Qua, MD   4 mg at 04/22/19 0542  . pantoprazole (PROTONIX) 80 mg in sodium chloride 0.9 % 250 mL (0.32 mg/mL) infusion  8 mg/hr Intravenous Continuous Opyd, Ilene Qua, MD 25 mL/hr at 04/21/19 2331 8 mg/hr at 04/21/19 2331  . [START ON 04/25/2019] pantoprazole (PROTONIX) injection 40 mg  40 mg Intravenous Q12H Opyd, Ilene Qua, MD      . promethazine (  PHENERGAN) injection 12.5 mg  12.5 mg Intravenous Q6H PRN Raiford Noble Stonewood, DO   12.5 mg at 04/22/19 9798  . sodium chloride flush (NS) 0.9 % injection 3 mL  3 mL Intravenous Q12H Opyd, Ilene Qua, MD   3 mL at 04/22/19 0808    Allergies as of 04/21/2019 - Review Complete 04/21/2019  Allergen Reaction Noted  . Ibuprofen Hives 12/02/2011  . Tylenol [acetaminophen] Rash 04/21/2019    History reviewed. No pertinent family history.  Social History   Socioeconomic History  . Marital status: Married    Spouse name: Not on file  . Number of children: Not on file  . Years of education: Not on file  . Highest education level: Not on file  Occupational History  . Not on file  Social Needs  . Financial resource strain: Not on file  . Food insecurity    Worry: Not on file    Inability: Not on file  . Transportation needs    Medical: Not on file    Non-medical: Not on file  Tobacco Use  . Smoking status: Former Smoker    Packs/day: 0.25    Years: 40.00    Pack years: 10.00    Types: Cigarettes    Quit date: 12/07/1998    Years since quitting: 20.3  . Smokeless tobacco: Former Systems developer    Types: Chew  Substance and Sexual Activity  . Alcohol use: Yes    Comment: rare  . Drug use: No  . Sexual activity: Not on file  Lifestyle  . Physical activity    Days per week: Not on file    Minutes per session: Not on file  . Stress: Not on file  Relationships  . Social Herbalist on phone: Not on file    Gets together: Not on file     Attends religious service: Not on file    Active member of club or organization: Not on file    Attends meetings of clubs or organizations: Not on file    Relationship status: Not on file  . Intimate partner violence    Fear of current or ex partner: Not on file    Emotionally abused: Not on file    Physically abused: Not on file    Forced sexual activity: Not on file  Other Topics Concern  . Not on file  Social History Narrative  . Not on file    Review of Systems: All systems reviewed and negative except where noted in HPI.  Physical Exam: Vital signs in last 24 hours: Temp:  [97.8 F (36.6 C)-98.6 F (37 C)] 98.6 F (37 C) (06/19 0654) Pulse Rate:  [90-115] 90 (06/19 0654) Resp:  [15-20] 16 (06/19 0654) BP: (127-142)/(76-95) 142/78 (06/19 0654) SpO2:  [95 %-100 %] 99 % (06/19 0654) Weight:  [82.6 kg-83.7 kg] 83.5 kg (06/19 0500) Last BM Date: 04/22/19 General:   Alert, well-developed,  Black male in NAD Psych:  Pleasant, cooperative. Normal mood and affect. Eyes:  Pupils equal, sclera clear, no icterus.   Conjunctiva pink. Ears:  Normal auditory acuity. Nose:  No deformity, discharge,  or lesions. Neck:  Supple; no masses Lungs:  Clear throughout to auscultation.   No wheezes, crackles, or rhonchi.  Heart:  Regular rate and rhythm; no murmurs, no lower extremity edema Abdomen:  Soft, non-distended, nontender, BS active, no palp mass    Rectal:  Deferred  Msk:  Symmetrical without gross deformities. . Neurologic:  Alert and  oriented x4;  grossly normal neurologically.    Intake/Output from previous day: 06/18 0701 - 06/19 0700 In: 656.6 [I.V.:656.6] Out: -  Intake/Output this shift: No intake/output data recorded.  Lab Results: Recent Labs    04/21/19 1938 04/22/19 0011 04/22/19 0834  WBC 7.4  --   --   HGB 11.0* 10.6* 9.3*  HCT 36.6* 33.6* 30.4*  PLT 188  --   --    BMET Recent Labs    04/21/19 1938 04/22/19 0011  NA 144 145  K 3.8 3.9  CL  114* 118*  CO2 23 20*  GLUCOSE 147* 114*  BUN 32* 31*  CREATININE 0.86 0.86  CALCIUM 9.3 8.8*   LFT Recent Labs    04/21/19 1938  PROT 7.2  ALBUMIN 4.0  AST 18  ALT 15  ALKPHOS 83  BILITOT 0.6   PT/INR No results for input(s): LABPROT, INR in the last 72 hours. Hepatitis Panel No results for input(s): HEPBSAG, HCVAB, HEPAIGM, HEPBIGM in the last 72 hours.   . CBC Latest Ref Rng & Units 04/22/2019 04/22/2019 04/21/2019  WBC 4.0 - 10.5 K/uL - - 7.4  Hemoglobin 13.0 - 17.0 g/dL 9.3(L) 10.6(L) 11.0(L)  Hematocrit 39.0 - 52.0 % 30.4(L) 33.6(L) 36.6(L)  Platelets 150 - 400 K/uL - - 188    . CMP Latest Ref Rng & Units 04/22/2019 04/21/2019 04/18/2018  Glucose 70 - 99 mg/dL 114(H) 147(H) 111(H)  BUN 8 - 23 mg/dL 31(H) 32(H) 21(H)  Creatinine 0.61 - 1.24 mg/dL 0.86 0.86 1.42(H)  Sodium 135 - 145 mmol/L 145 144 138  Potassium 3.5 - 5.1 mmol/L 3.9 3.8 4.9  Chloride 98 - 111 mmol/L 118(H) 114(H) 112(H)  CO2 22 - 32 mmol/L 20(L) 23 20(L)  Calcium 8.9 - 10.3 mg/dL 8.8(L) 9.3 8.5(L)  Total Protein 6.5 - 8.1 g/dL - 7.2 -  Total Bilirubin 0.3 - 1.2 mg/dL - 0.6 -  Alkaline Phos 38 - 126 U/L - 83 -  AST 15 - 41 U/L - 18 -  ALT 0 - 44 U/L - 15 -   Studies/Results: No results found.   Tye Savoy, NP-C @  04/22/2019, 9:32 AM

## 2019-04-22 NOTE — Anesthesia Preprocedure Evaluation (Signed)
Anesthesia Evaluation  Patient identified by MRN, date of birth, ID band Patient awake    Reviewed: Allergy & Precautions, H&P , NPO status , Patient's Chart, lab work & pertinent test results  Airway Mallampati: II  TM Distance: >3 FB Neck ROM: Full    Dental no notable dental hx.    Pulmonary former smoker,    Pulmonary exam normal breath sounds clear to auscultation       Cardiovascular hypertension, Normal cardiovascular exam Rhythm:Regular Rate:Normal     Neuro/Psych    GI/Hepatic   Endo/Other    Renal/GU      Musculoskeletal   Abdominal   Peds  Hematology  (+) anemia ,   Anesthesia Other Findings   Reproductive/Obstetrics                             Anesthesia Physical  Anesthesia Plan  ASA: II  Anesthesia Plan: MAC   Post-op Pain Management:    Induction: Intravenous  PONV Risk Score and Plan: 1 and Ondansetron and Treatment may vary due to age or medical condition  Airway Management Planned: Nasal Cannula  Additional Equipment:   Intra-op Plan:   Post-operative Plan:   Informed Consent: I have reviewed the patients History and Physical, chart, labs and discussed the procedure including the risks, benefits and alternatives for the proposed anesthesia with the patient or authorized representative who has indicated his/her understanding and acceptance.       Plan Discussed with: CRNA and Surgeon  Anesthesia Plan Comments:         Anesthesia Quick Evaluation

## 2019-04-23 DIAGNOSIS — K31811 Angiodysplasia of stomach and duodenum with bleeding: Principal | ICD-10-CM

## 2019-04-23 LAB — CBC WITH DIFFERENTIAL/PLATELET
Abs Immature Granulocytes: 0.05 10*3/uL (ref 0.00–0.07)
Basophils Absolute: 0 10*3/uL (ref 0.0–0.1)
Basophils Relative: 0 %
Eosinophils Absolute: 0.5 10*3/uL (ref 0.0–0.5)
Eosinophils Relative: 6 %
HCT: 32 % — ABNORMAL LOW (ref 39.0–52.0)
Hemoglobin: 9.1 g/dL — ABNORMAL LOW (ref 13.0–17.0)
Immature Granulocytes: 1 %
Lymphocytes Relative: 15 %
Lymphs Abs: 1.5 10*3/uL (ref 0.7–4.0)
MCH: 31.2 pg (ref 26.0–34.0)
MCHC: 28.4 g/dL — ABNORMAL LOW (ref 30.0–36.0)
MCV: 109.6 fL — ABNORMAL HIGH (ref 80.0–100.0)
Monocytes Absolute: 1.1 10*3/uL — ABNORMAL HIGH (ref 0.1–1.0)
Monocytes Relative: 12 %
Neutro Abs: 6.5 10*3/uL (ref 1.7–7.7)
Neutrophils Relative %: 66 %
Platelets: 115 10*3/uL — ABNORMAL LOW (ref 150–400)
RBC: 2.92 MIL/uL — ABNORMAL LOW (ref 4.22–5.81)
RDW: 16.7 % — ABNORMAL HIGH (ref 11.5–15.5)
WBC: 9.7 10*3/uL (ref 4.0–10.5)
nRBC: 0 % (ref 0.0–0.2)

## 2019-04-23 LAB — COMPREHENSIVE METABOLIC PANEL
ALT: 16 U/L (ref 0–44)
AST: 25 U/L (ref 15–41)
Albumin: 3.1 g/dL — ABNORMAL LOW (ref 3.5–5.0)
Alkaline Phosphatase: 63 U/L (ref 38–126)
Anion gap: 9 (ref 5–15)
BUN: 18 mg/dL (ref 8–23)
CO2: 20 mmol/L — ABNORMAL LOW (ref 22–32)
Calcium: 7.7 mg/dL — ABNORMAL LOW (ref 8.9–10.3)
Chloride: 111 mmol/L (ref 98–111)
Creatinine, Ser: 0.83 mg/dL (ref 0.61–1.24)
GFR calc Af Amer: >60 mL/min (ref >60)
GFR calc non Af Amer: >60 mL/min (ref >60)
Glucose, Bld: 79 mg/dL (ref 70–99)
Potassium: 3.6 mmol/L (ref 3.5–5.1)
Sodium: 140 mmol/L (ref 135–145)
Total Bilirubin: 0.7 mg/dL (ref 0.3–1.2)
Total Protein: 5.5 g/dL — ABNORMAL LOW (ref 6.5–8.1)

## 2019-04-23 LAB — NOVEL CORONAVIRUS, NAA (HOSP ORDER, SEND-OUT TO REF LAB; TAT 18-24 HRS): SARS-CoV-2, NAA: NOT DETECTED

## 2019-04-23 LAB — GLUCOSE, CAPILLARY
Glucose-Capillary: 65 mg/dL — ABNORMAL LOW (ref 70–99)
Glucose-Capillary: 100 mg/dL — ABNORMAL HIGH (ref 70–99)

## 2019-04-23 LAB — MAGNESIUM: Magnesium: 2 mg/dL (ref 1.7–2.4)

## 2019-04-23 LAB — PHOSPHORUS: Phosphorus: 2.1 mg/dL — ABNORMAL LOW (ref 2.5–4.6)

## 2019-04-23 MED ORDER — PANTOPRAZOLE SODIUM 40 MG PO TBEC
40.0000 mg | DELAYED_RELEASE_TABLET | Freq: Every day | ORAL | Status: DC
  Administered 2019-04-23 – 2019-04-24 (×2): 40 mg via ORAL
  Filled 2019-04-23 (×2): qty 1

## 2019-04-23 MED ORDER — K PHOS MONO-SOD PHOS DI & MONO 155-852-130 MG PO TABS
500.0000 mg | ORAL_TABLET | Freq: Two times a day (BID) | ORAL | Status: AC
  Administered 2019-04-23 (×2): 500 mg via ORAL
  Filled 2019-04-23 (×2): qty 2

## 2019-04-23 NOTE — Plan of Care (Signed)
  Problem: Health Behavior/Discharge Planning: Goal: Ability to manage health-related needs will improve Outcome: Progressing   Problem: Clinical Measurements: Goal: Ability to maintain clinical measurements within normal limits will improve Outcome: Progressing Goal: Will remain free from infection Outcome: Progressing Goal: Diagnostic test results will improve Outcome: Progressing Goal: Respiratory complications will improve Outcome: Progressing Goal: Cardiovascular complication will be avoided Outcome: Progressing   Problem: Activity: Goal: Risk for activity intolerance will decrease Outcome: Progressing   Problem: Nutrition: Goal: Adequate nutrition will be maintained Outcome: Progressing   Problem: Coping: Goal: Level of anxiety will decrease Outcome: Progressing   Problem: Pain Managment: Goal: General experience of comfort will improve Outcome: Progressing   Problem: Safety: Goal: Ability to remain free from injury will improve Outcome: Progressing   Problem: Skin Integrity: Goal: Risk for impaired skin integrity will decrease Outcome: Progressing   

## 2019-04-23 NOTE — Progress Notes (Signed)
PROGRESS NOTE    Nathaniel [name]  FWY:[id number] DOB: [birth year] DOA: 04/21/2019 PCP: Shirline Frees, MD  Brief Narrative:  HPI per Dr. Mitzi Hansen on 04/21/2019 Nathaniel [name] is a 84 y.o. male with medical history significant for rheumatoid arthritis, chronic pain, hypertension, and glaucoma with left eye blindness, now presenting to emergency department for evaluation of hematemesis and melena.  Patient reports that he went to bed last night in his usual state of health, noted some mild nausea early today, went on to have a bowel movement that he describes as very dark, and then later had one episode of vomiting dark red blood.  He feels weak and a little lightheaded when sitting up or standing, but has not passed out and denies any chest pain or palpitations.  He has never had these symptoms previously.  He denies any abdominal pain or fevers.  Reports using Aleve most days.  Denies alcohol use or history of liver disease.  Reports that he has had colonoscopy performed previously by Dr. Shana Chute but has never had EGD. Takes Plavix daily. Does not use a PPI or H-2 blocker.   ED Course: Upon arrival to the ED, patient is found to be afebrile, saturating well on room air, tachycardic to 115, and with stable blood pressure.  Chemistry panel is notable for an elevated BUN to creatinine ratio and CBC features a macrocytic anemia with hemoglobin 11.0.  There was no gross blood on DRE per the ED physician, but FOBT is positive.  Hospitalists are asked to admit.  **Interim History Gastroenterology was consulted and patient went down for an EGD done and showed a benign-appearing esophageal stenosis as well as a single nonbleeding angiodysplastic lesion in the stomach which was treated with argon plasma as well as 3 angiodysplastic lesions in the duodenum which was treated with argon plasma coagulation.  GI recommended continuing Protonix drip for now and then transition to p.o. Protonix daily afterwards and  holding Plavix for at least 5 days  Assessment & Plan:   Principal Problem:   Acute upper GI bleed Active Problems:   Acute upper GI bleeding   Rheumatoid arthritis (HCC)   Chronic pain   Chronic combined systolic and diastolic CHF (congestive heart failure) (HCC)   Macrocytic anemia   Hematemesis with nausea   Melena  Acute upper GI bleeding in the setting of Clopidogrel and NSAID Usage  Macrocytic Anemia  -Presented with one day of melena and hematemesis x1 -He is mildly tachycardic with stable BP and Hgb of 11.0 -Hgb/Hct went from 11.0/36.6 -> 10.6/33.6 -> 9.3/20.4 -> 9.5/33.0 -> 9.1/32.0 - Abdomen is soft and non-tender  -He was observed to have small amount of clear vomitus in ED, no bleeding since arrival  -Started on Protonix drip and this was changed to Protonix 40 mg po Daily by GI -Gastroenterology took the patient up for an EGD which showed a single angiodysplastic nonbleeding lesion in the stomach which was treated with argon plasma as well as 3 angiodysplastic lesion in the duodenum which was also treated with argon plasma coagulation -GI recommending clear liquid diet for now and advance as tolerated and is now on a FULL Liquid diet and will got to Soft in AM -Holding Plavix for at least 5 days (resuming 04/27/2019) as well as follow-up with Gastroenterology within 1 month -GI also recommending avoiding NSAIDs, aspirin, ibuprofen naproxen, as well as others nonsteroidal drugs -Patient appears to be significantly weak so will obtain PT/OT Consultation  Chronic Combined  Systolic & Diastolic CHF  -Appears compensated on admission and is being hydrated in setting of acute UGIB with tachycardia  -Follow daily wt and strict I/O's  -Patient is +1.930 L since admission -Fluids were changed to lactated Ringer's at 75 mL's per hour and will stop today  Rheumatoid Arthritis; chronic pain  -Managed at home with methotrexate, baclofen, gabapentin, Norco, Alleve, and topical  Voltaren at home  -Currently holding all his medications bruised resume gabapentin and baclofen as well as Norco for now but will hold if becomes pre-syncopal -No pain complaints on admission   -Stop NSAID's,  Hypertension  -BP starting to become more elevated so will resume Lisinopril in AM  -BP is now 150/80 -Hold scheduled BP meds in setting of acute GIB, treat as-needed   Glaucoma  -Continue with dorzolamide-timolol 22.3-6.8 mg/mL 1 drop both eyes twice daily per ophthalmic solution along with latanoprost 3.005% ophthalmic solution 1 drop in both eyes nightly -Resumed home brimonidine 0.15% ophthalmic solution 1 drop in both eyes twice daily  Seasonal Allergies  -Continue with sertraline substitution with loratadine 10 mg p.o. daily  Thrombocytopenia -Patient's platelet count went from 188 -> 115 -Continue to Monitor for S/Sx of bleeding; Currently no overt bleeding noted -Repeat CBC in AM   Hypophosphatemia -Patient's phosphorus levels morning was 2.1 -Replete with p.o. K-Phos Neutral 500 mg p.o. twice daily x2 doses -Continue to monitor and replete as necessary -Repeat phosphorus level in a.m.  Hypoglycemia -With mild at 65 this morning his blood sugars have been ranging from 90-100 -Diet advancement -Continue to Monitor and Trend -Repeat blood sugars in a.m.  DVT prophylaxis: SCDs Code Status: FULL CODE  Family Communication: No family present at bedside Disposition Plan: Remain inpatient for diet advancement and obtain PT/OT evaluation  Consultants:   [facility]    Procedures:  EGD Findings:      One benign-appearing, intrinsic moderate stenosis was found at the       gastroesophageal junction. This stenosis measured 1.4 cm (inner       diameter). The stenosis was traversed.      The exam of the esophagus was otherwise normal.      A single 3 mm angiodysplastic lesion with no bleeding was found in the       gastric fundus. Coagulation to  prevent bleeding by argon plasma was       successful.      Hematin and solid (altered blood/coffee-ground-like material) and solid       food was found in the gastric fundus, in the gastric body and in the       gastric antrum which significantly limited the exam with several areas       not seen.      A small hiatal hernia was present.      The exam of the stomach was otherwise normal with limited views as       outlined above.      Three 2 to 5 mm angiodysplastic lesions with bleeding on contact from       the 5 mm angiodysplasia were found in the duodenal bulb. Coagulation for       bleeding prevention using argon plasma was successful.      The second portion of the duodenum was normal. Impression:               - Benign-appearing esophageal stenosis.                           -  A single non-bleeding angiodysplastic lesion in                            the stomach. Treated with argon plasma coagulation                            (APC).                           - Hematin (altered blood/coffee-ground-like                            material) and solid food in the gastric fundus, in                            the gastric body and in the gastric antrum.                           - Small hiatal hernia.                           - Three angiodysplastic lesions in the duodenum.                            Treated with argon plasma coagulation (APC).                           - Normal second portion of the duodenum.                           - No specimens collected.  Antimicrobials:  Anti-infectives (From admission, onward)   None     Subjective: Seen and examined at bedside does not complain of anything.  Denies chest pain, lightheadedness or dizziness.  No nausea or vomiting.  Has not had a bowel movement but is passing gas.  No other concerns complaints at this time  Objective: Vitals:   04/23/19 0055 04/23/19 0441 04/23/19 0600 04/23/19 1343  BP: (!) 142/70 (!) 145/73  (!)  150/80  Pulse: 75 78  73  Resp: 18 12  16   Temp: 98.3 F (36.8 C) 98.5 F (36.9 C)  98.3 F (36.8 C)  TempSrc: Oral Oral  Oral  SpO2: 100% 98%  99%  Weight:  88.2 kg 88.2 kg   Height:        Intake/Output Summary (Last 24 hours) at 04/23/2019 1825 Last data filed at 04/23/2019 1414 Gross per 24 hour  Intake 813.37 ml  Output -  Net 813.37 ml   Filed Weights   04/22/19 0500 04/23/19 0441 04/23/19 0600  Weight: 83.5 kg 88.2 kg 88.2 kg   Examination: Physical Exam:  Constitutional: Thin elderly African-American male currently no acute distress appears calm Eyes: Lids and conjunctive are normal.  Sclera anicteric ENMT: External ears nose appear normal.  Grossly normal hearing.  Mucous murmurs are moist Neck: Appears supple no JVD Respiratory: Slight diminished auscultation bilaterally no appreciable wheeze, rales or rhonchi.  Patient was not tachypneic or using accessory muscle use Cardiovascular: Regular rate and rhythm.  No appreciable murmurs, rubs, gallops Abdomen: Soft, not tender to palpate slightly distended.  Bowel  sounds present GU: Deferred Musculoskeletal: No contractures or cyanosis.  No joint deformities in upper lower extremities Skin: No appreciable rashes or lesions on skin evaluation Neurologic: Cranial nerves II through XII gross intact no appreciable focal deficits.  Romberg sign cerebellar reflexes were not assessed Psychiatric: Normal judgment insight.  Patient is awake, alert, oriented.  Not as anxious today  Data Reviewed: I have personally reviewed following labs and imaging studies  CBC: Recent Labs  Lab 04/21/19 1938 04/22/19 0011 04/22/19 0834 04/22/19 1548 04/23/19 0328  WBC 7.4  --   --   --  9.7  NEUTROABS  --   --   --   --  6.5  HGB 11.0* 10.6* 9.3* 9.5* 9.1*  HCT 36.6* 33.6* 30.4* 33.0* 32.0*  MCV 103.4*  --   --   --  109.6*  PLT 188  --   --   --  256*   Basic Metabolic Panel: Recent Labs  Lab 04/21/19 1938 04/22/19 0011  04/23/19 0328  NA 144 145 140  K 3.8 3.9 3.6  CL 114* 118* 111  CO2 23 20* 20*  GLUCOSE 147* 114* 79  BUN 32* 31* 18  CREATININE 0.86 0.86 0.83  CALCIUM 9.3 8.8* 7.7*  MG  --   --  2.0  PHOS  --   --  2.1*   GFR: Estimated Creatinine Clearance: 77.5 mL/min (by C-G formula based on SCr of 0.83 mg/dL). Liver Function Tests: Recent Labs  Lab 04/21/19 1938 04/23/19 0328  AST 18 25  ALT 15 16  ALKPHOS 83 63  BILITOT 0.6 0.7  PROT 7.2 5.5*  ALBUMIN 4.0 3.1*   No results for input(s): LIPASE, AMYLASE in the last 168 hours. No results for input(s): AMMONIA in the last 168 hours. Coagulation Profile: No results for input(s): INR, PROTIME in the last 168 hours. Cardiac Enzymes: No results for input(s): CKTOTAL, CKMB, CKMBINDEX, TROPONINI in the last 168 hours. BNP (last 3 results) No results for input(s): PROBNP in the last 8760 hours. HbA1C: No results for input(s): HGBA1C in the last 72 hours. CBG: Recent Labs  Lab 04/22/19 0728 04/23/19 0758 04/23/19 0907  GLUCAP 90 65* 100*   Lipid Profile: No results for input(s): CHOL, HDL, LDLCALC, TRIG, CHOLHDL, LDLDIRECT in the last 72 hours. Thyroid Function Tests: No results for input(s): TSH, T4TOTAL, FREET4, T3FREE, THYROIDAB in the last 72 hours. Anemia Panel: No results for input(s): VITAMINB12, FOLATE, FERRITIN, TIBC, IRON, RETICCTPCT in the last 72 hours. Sepsis Labs: No results for input(s): PROCALCITON, LATICACIDVEN in the last 168 hours.  Recent Results (from the past 240 hour(s))  Novel Coronavirus,NAA,(SEND-OUT TO REF LAB - TAT 24-48 hrs); Hosp Order     Status: None   Collection Time: 04/21/19  9:19 PM   Specimen: Nasopharyngeal Swab; Respiratory  Result Value Ref Range Status   SARS-CoV-2, NAA NOT DETECTED NOT DETECTED Final    Comment: (NOTE) This test was developed and its performance characteristics determined by [employer]. This test has not been FDA cleared or approved. This test has been  authorized by FDA under an Emergency Use Authorization (EUA). This test is only authorized for the duration of time the declaration that circumstances exist justifying the authorization of the emergency use of in vitro diagnostic tests for detection of SARS-CoV-2 virus and/or diagnosis of COVID-19 infection under section 564(b)(1) of the Act, 21 U.S.C. 389HTD-4(K)(8), unless the authorization is terminated or revoked sooner. When diagnostic testing is negative, the possibility of a false  negative result should be considered in the context of a patient's recent exposures and the presence of clinical signs and symptoms consistent with COVID-19. An individual without symptoms of COVID-19 and who is not shedding SARS-CoV-2 virus would expect to have a negative (not detected) result in this assay. Performed  At: [facility] [place], [place] [id number] Rush Farmer MD XT:[id number]    [place]  Final    Comment: Performed at [facility] [address] [place], [place] [address]  SARS Coronavirus 2 (CEPHEID - Performed in [place] hospital lab), Hosp Order     Status: None   Collection Time: 04/22/19  8:47 AM   Specimen: Nasopharyngeal Swab  Result Value Ref Range Status   SARS Coronavirus 2 NEGATIVE NEGATIVE Final    Comment: (NOTE) If result is NEGATIVE SARS-CoV-2 target nucleic acids are NOT DETECTED. The SARS-CoV-2 RNA is generally detectable in upper and lower  respiratory specimens during the acute phase of infection. The lowest  concentration of SARS-CoV-2 viral copies this assay can detect is 250  copies / mL. A negative result does not preclude SARS-CoV-2 infection  and should not be used as the sole basis for treatment or other  patient management decisions.  A negative result may occur with  improper specimen collection / handling, submission of specimen other  than nasopharyngeal swab, presence of  viral mutation(s) within the  areas targeted by this assay, and inadequate number of viral copies  (<250 copies / mL). A negative result must be combined with clinical  observations, patient history, and epidemiological information. If result is POSITIVE SARS-CoV-2 target nucleic acids are DETECTED. The SARS-CoV-2 RNA is generally detectable in upper and lower  respiratory specimens dur ing the acute phase of infection.  Positive  results are indicative of active infection with SARS-CoV-2.  Clinical  correlation with patient history and other diagnostic information is  necessary to determine patient infection status.  Positive results do  not rule out bacterial infection or co-infection with other viruses. If result is PRESUMPTIVE POSTIVE SARS-CoV-2 nucleic acids MAY BE PRESENT.   A presumptive positive result was obtained on the submitted specimen  and confirmed on repeat testing.  While 2019 novel coronavirus  (SARS-CoV-2) nucleic acids may be present in the submitted sample  additional confirmatory testing may be necessary for epidemiological  and / or clinical management purposes  to differentiate between  SARS-CoV-2 and other Sarbecovirus currently known to infect humans.  If clinically indicated additional testing with an alternate test  methodology [phone]) is advised. The SARS-CoV-2 RNA is generally  detectable in upper and lower respiratory sp ecimens during the acute  phase of infection. The expected result is Negative. Fact Sheet for Patients:  StrictlyIdeas.no Fact Sheet for Healthcare Providers: BankingDealers.co.za This test is not yet approved or cleared by the [place] FDA and has been authorized for detection and/or diagnosis of SARS-CoV-2 by FDA under an Emergency Use Authorization (EUA).  This EUA will remain in effect (meaning this test can be used) for the duration of the COVID-19 declaration under Section  564(b)(1) of the Act, 21 U.S.C. section 360bbb-3(b)(1), unless the authorization is terminated or revoked sooner. Performed at [facility], [place] [address], [place], [place] [address]     Radiology Studies: No results found.  Scheduled Meds: . baclofen  10 mg Oral BID  . brimonidine  1 drop Both Eyes BID  . dorzolamide-timolol  1 drop Both Eyes BID  .  folic acid  1 mg Oral QHS  . gabapentin  800 mg Oral BID  . latanoprost  1 drop Both Eyes QHS  . loratadine  10 mg Oral Daily  . pantoprazole  40 mg Oral Daily  . phosphorus  500 mg Oral BID  . sodium chloride flush  3 mL Intravenous Q12H   Continuous Infusions: . lactated ringers 75 mL/hr at 04/23/19 0940    LOS: 1 day   Kerney Elbe, DO [facility] PAGER is on [place]  If 7PM-7AM, please contact night-coverage [email or web address] Password [facility] 04/23/2019, 6:25 PM

## 2019-04-23 NOTE — Progress Notes (Addendum)
     [facility] Progress Note  CC:  UGI bleed, hematemesis, melena  Subjective: He denies having any abdominal pain. Tolerating a clear liquid diet. No BM today.  Objective:  Vital signs in last 24 hours: Temp:  [97.9 F (36.6 C)-98.5 F (36.9 C)] 98.5 F (36.9 C) (06/20 0441) Pulse Rate:  [75-79] 78 (06/20 0441) Resp:  [12-22] 12 (06/20 0441) BP: (142-152)/(70-76) 145/73 (06/20 0441) SpO2:  [98 %-100 %] 98 % (06/20 0441) Weight:  [88.2 kg] 88.2 kg (06/20 0600) Last BM Date: 04/22/19 General:   Alert,  Well-developed, in NAD Eyes: left eye defect secondary to glaucoma Heart: RRR, no murmur Pulm: lungs clear throughout.  Abdomen: soft, nondistended, nontender, + BS x 4 quads. Extremities:  Without edema. Neurologic:  Alert and  oriented x4;  grossly normal neurologically. Psych:  Alert and cooperative. Normal mood and affect.  Intake/Output from previous day: 06/19 0701 - 06/20 0700 In: 793.4 [P.O.:60; I.V.:733.4] Out: -  Intake/Output this shift: Total I/O In: 240 [P.O.:240] Out: -   Lab Results: Recent Labs    04/21/19 1938  04/22/19 0834 04/22/19 1548 04/23/19 0328  WBC 7.4  --   --   --  9.7  HGB 11.0*   < > 9.3* 9.5* 9.1*  HCT 36.6*   < > 30.4* 33.0* 32.0*  PLT 188  --   --   --  115*   < > = values in this interval not displayed.   BMET Recent Labs    04/21/19 1938 04/22/19 0011 04/23/19 0328  NA 144 145 140  K 3.8 3.9 3.6  CL 114* 118* 111  CO2 23 20* 20*  GLUCOSE 147* 114* 79  BUN 32* 31* 18  CREATININE 0.86 0.86 0.83  CALCIUM 9.3 8.8* 7.7*   LFT Recent Labs    04/23/19 0328  PROT 5.5*  ALBUMIN 3.1*  AST 25  ALT 16  ALKPHOS 63  BILITOT 0.7    Assessment / Plan:  1. 84 y.o. male with UGI bleed on Plavix. S/P EGD 04/22/2019 which identified a nonbleeding angiodysplastic lesion in the stomach and 3 angiodysplastic lesions in the duodenum treated with APC. Hematin and solid food in the gastric fundus and a benign  appearing esophageal stenosis was noted. Hg 9.1 down from 9.5. BUN 18. - stop PPI infusion, to start Protonix 40mg  po QD x 4 weeks -will need to follow up with GI Dr. Acquanetta Sit in 4 weeks -if no active bleeding ok to restart Plavix on 04/27/2019 -advance to full liquid diet.  2. Anemia secondary to # 1. Hg 9.1 down from 9.5.  continue to monitor H/H  3. History of CVA on Plavix  -ok to restart Plavix on 04/27/2019 if no signs of active GI bleeding  GI service to sign off today     LOS: 1 day   Noralyn Pick NP 04/23/2019, 1:03 PM      Attending Physician Note   I have taken an interval history, reviewed the chart and examined the patient. I agree with the Advanced Practitioner's note, impression and recommendations.   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 No recurrent bleeding.  Esophageal stricture, likely GERD related.   Change to PPI po qd long term Advance diet ad tolerated OK for discharge from GI standpoint  Hold Plavix to at least 6/24  GI signing off  GI follow up with Acquanetta Sit, MD  Lucio Edward, MD [facility] [phone]

## 2019-04-23 NOTE — Progress Notes (Signed)
Hypoglycemic Event  CBG: 65  Treatment: 4 oz juice/soda  Symptoms: None  Follow-up CBG: Time:0908 CBG Result:100  Possible Reasons for Event: Inadequate meal intake  Comments/MD notified: Dr. Alfredia Ferguson aware.    Rosana Fret

## 2019-04-24 LAB — CBC WITH DIFFERENTIAL/PLATELET
Abs Immature Granulocytes: 0.02 10*3/uL (ref 0.00–0.07)
Basophils Absolute: 0 10*3/uL (ref 0.0–0.1)
Basophils Relative: 0 %
Eosinophils Absolute: 0.5 10*3/uL (ref 0.0–0.5)
Eosinophils Relative: 6 %
HCT: 27.7 % — ABNORMAL LOW (ref 39.0–52.0)
Hemoglobin: 8.5 g/dL — ABNORMAL LOW (ref 13.0–17.0)
Immature Granulocytes: 0 %
Lymphocytes Relative: 17 %
Lymphs Abs: 1.3 10*3/uL (ref 0.7–4.0)
MCH: 31.7 pg (ref 26.0–34.0)
MCHC: 30.7 g/dL (ref 30.0–36.0)
MCV: 103.4 fL — ABNORMAL HIGH (ref 80.0–100.0)
Monocytes Absolute: 0.8 10*3/uL (ref 0.1–1.0)
Monocytes Relative: 11 %
Neutro Abs: 5.3 10*3/uL (ref 1.7–7.7)
Neutrophils Relative %: 66 %
Platelets: 110 10*3/uL — ABNORMAL LOW (ref 150–400)
RBC: 2.68 MIL/uL — ABNORMAL LOW (ref 4.22–5.81)
RDW: 16.5 % — ABNORMAL HIGH (ref 11.5–15.5)
WBC: 7.9 10*3/uL (ref 4.0–10.5)
nRBC: 0 % (ref 0.0–0.2)

## 2019-04-24 LAB — COMPREHENSIVE METABOLIC PANEL
ALT: 14 U/L (ref 0–44)
AST: 19 U/L (ref 15–41)
Albumin: 3.1 g/dL — ABNORMAL LOW (ref 3.5–5.0)
Alkaline Phosphatase: 63 U/L (ref 38–126)
Anion gap: 7 (ref 5–15)
BUN: 12 mg/dL (ref 8–23)
CO2: 21 mmol/L — ABNORMAL LOW (ref 22–32)
Calcium: 8.5 mg/dL — ABNORMAL LOW (ref 8.9–10.3)
Chloride: 114 mmol/L — ABNORMAL HIGH (ref 98–111)
Creatinine, Ser: 0.89 mg/dL (ref 0.61–1.24)
GFR calc Af Amer: >60 mL/min (ref >60)
GFR calc non Af Amer: >60 mL/min (ref >60)
Glucose, Bld: 86 mg/dL (ref 70–99)
Potassium: 3.3 mmol/L — ABNORMAL LOW (ref 3.5–5.1)
Sodium: 142 mmol/L (ref 135–145)
Total Bilirubin: 0.3 mg/dL (ref 0.3–1.2)
Total Protein: 5.3 g/dL — ABNORMAL LOW (ref 6.5–8.1)

## 2019-04-24 LAB — CBC
HCT: 28.6 % — ABNORMAL LOW (ref 39.0–52.0)
Hemoglobin: 8.8 g/dL — ABNORMAL LOW (ref 13.0–17.0)
MCH: 31.5 pg (ref 26.0–34.0)
MCHC: 30.8 g/dL (ref 30.0–36.0)
MCV: 102.5 fL — ABNORMAL HIGH (ref 80.0–100.0)
Platelets: 116 10*3/uL — ABNORMAL LOW (ref 150–400)
RBC: 2.79 MIL/uL — ABNORMAL LOW (ref 4.22–5.81)
RDW: 16.2 % — ABNORMAL HIGH (ref 11.5–15.5)
WBC: 8.8 10*3/uL (ref 4.0–10.5)
nRBC: 0 % (ref 0.0–0.2)

## 2019-04-24 LAB — PHOSPHORUS: Phosphorus: 3.9 mg/dL (ref 2.5–4.6)

## 2019-04-24 LAB — MAGNESIUM: Magnesium: 1.9 mg/dL (ref 1.7–2.4)

## 2019-04-24 LAB — GLUCOSE, CAPILLARY: Glucose-Capillary: 79 mg/dL (ref 70–99)

## 2019-04-24 MED ORDER — CLOPIDOGREL BISULFATE 75 MG PO TABS: 75.0000 mg | ORAL_TABLET | Freq: Every day | ORAL | 0 refills | Status: DC

## 2019-04-24 MED ORDER — ONDANSETRON HCL 4 MG PO TABS: 4.0000 mg | ORAL_TABLET | Freq: Four times a day (QID) | ORAL | 0 refills | Status: DC | PRN

## 2019-04-24 MED ORDER — POTASSIUM CHLORIDE CRYS ER 20 MEQ PO TBCR
40.0000 meq | EXTENDED_RELEASE_TABLET | Freq: Two times a day (BID) | ORAL | Status: DC
  Administered 2019-04-24: 40 meq via ORAL
  Filled 2019-04-24: qty 2

## 2019-04-24 MED ORDER — PANTOPRAZOLE SODIUM 40 MG PO TBEC: 40.0000 mg | DELAYED_RELEASE_TABLET | Freq: Every day | ORAL | 0 refills | Status: DC

## 2019-04-24 NOTE — Evaluation (Signed)
Occupational Therapy Evaluation Patient Details Name: Nathaniel [name] MRN: [id number] DOB: Aug 12, 1935 Today's Date: 04/24/2019    History of Present Illness 84 year old man admitted for upper GI bleed; h/o RA, chronic pain and glaucoma/blindness L eye   Clinical Impression   Pt was admitted for the above. He is moving at a supervision level and needed occasional min A for reaching feet during adls 2* stiffness.  He does not need further OT    Follow Up Recommendations  No OT follow up    Equipment Recommendations  None recommended by OT    Recommendations for Other Services       Precautions / Restrictions Precautions Precautions: Fall Restrictions Weight Bearing Restrictions: No      Mobility Bed Mobility               General bed mobility comments: oob  Transfers Overall transfer level: Needs assistance Equipment used: Rolling walker (2 wheeled) Transfers: Sit to/from Stand Sit to Stand: Supervision              Balance Overall balance assessment: No apparent balance deficits (not formally assessed)                                         ADL either performed or assessed with clinical judgement   ADL Overall ADL's : Needs assistance/impaired             Lower Body Bathing: Minimal assistance       Lower Body Dressing: Minimal assistance   Toilet Transfer: Supervision/safety;RW(chair)             General ADL Comments: ambulated to bathroom and performed bathing standing at the sink.  Changed socks when sitting in chair:  had a little difficulty, stating he was stiff.      Vision         Perception     Praxis      Pertinent Vitals/Pain Pain Assessment: Faces Faces Pain Scale: Hurts a little bit Pain Location: legs Pain Descriptors / Indicators: Tightness Pain Intervention(s): Limited activity within patient's tolerance;Monitored during session;Repositioned     Hand Dominance Right   Extremity/Trunk  Assessment Upper Extremity Assessment Upper Extremity Assessment: Generalized weakness           Communication Communication Communication: No difficulties   Cognition Arousal/Alertness: Awake/alert Behavior During Therapy: WFL for tasks assessed/performed Overall Cognitive Status: Within Functional Limits for tasks assessed                                     General Comments       Exercises     Shoulder Instructions      Home Living Family/patient expects to be discharged to:: Private residence Living Arrangements: Spouse/significant other                 Bathroom Shower/Tub: Occupational psychologist: Handicapped height     Home Equipment: Shower seat;Cane - single point;Walker - 2 wheels          Prior Functioning/Environment Level of Independence: Independent with assistive device(s)        Comments: pt has a garden and mows.  Uses cane most of the time        OT Problem List:  OT Treatment/Interventions:      OT Goals(Current goals can be found in the care plan section) Acute Rehab OT Goals Patient Stated Goal: return to independence OT Goal Formulation: All assessment and education complete, DC therapy  OT Frequency:     Barriers to D/C:            Co-evaluation              AM-PAC OT "6 Clicks" Daily Activity     Outcome Measure Help from another person eating meals?: None Help from another person taking care of personal grooming?: A Little Help from another person toileting, which includes using toliet, bedpan, or urinal?: A Little Help from another person bathing (including washing, rinsing, drying)?: A Little Help from another person to put on and taking off regular upper body clothing?: A Little Help from another person to put on and taking off regular lower body clothing?: A Little 6 Click Score: 19   End of Session    Activity Tolerance: Patient tolerated treatment well Patient left: in  chair;with call bell/phone within reach  OT Visit Diagnosis: Muscle weakness (generalized) (M62.81)                Time: 3536-1443 OT Time Calculation (min): 19 min Charges:  OT General Charges $OT Visit: 1 Visit OT Evaluation $OT Eval Low Complexity: 1 Low  Lesle Chris, OTR/L Acute Rehabilitation Services [phone] [facility] pager [phone] office 04/24/2019  [place] 04/24/2019, 9:58 AM

## 2019-04-24 NOTE — Evaluation (Signed)
Physical Therapy Evaluation Patient Details Name: Nathaniel [name] MRN: [id number] DOB: 1934/12/09 Today's Date: 04/24/2019   History of Present Illness  84 year old man admitted for upper GI bleed; h/o RA, chronic pain and glaucoma/blindness L eye  Clinical Impression  On eval, pt was Supv-Min guard for mobility. He walked ~150 feet with a RW. Pt tolerated activity well. He denied lightheadedness/dizziness. Will continue to follow during hospital stay. Do not anticipate any f/u PT needs at d/c.    Follow Up Recommendations No PT follow up;Supervision for mobility/OOB    Equipment Recommendations  None recommended by PT    Recommendations for Other Services       Precautions / Restrictions Precautions Precautions: Fall Restrictions Weight Bearing Restrictions: No      Mobility  Bed Mobility Overal bed mobility: Needs Assistance Bed Mobility: Sit to Supine       Sit to supine: Supervision   General bed mobility comments: oob  Transfers Overall transfer level: Needs assistance Equipment used: Rolling walker (2 wheeled) Transfers: Sit to/from Stand Sit to Stand: Supervision            Ambulation/Gait Ambulation/Gait assistance: Min guard Gait Distance (Feet): 150 Feet Assistive device: Rolling walker (2 wheeled) Gait Pattern/deviations: Decreased stride length     General Gait Details: slow gait speed. close guard for safety. Pt denied lightheadedness.  Stairs            Wheelchair Mobility    Modified Rankin (Stroke Patients Only)       Balance Overall balance assessment: Needs assistance           Standing balance-Leahy Scale: Fair                               Pertinent Vitals/Pain Pain Assessment: Faces Faces Pain Scale: Hurts a little bit Pain Location: legs Pain Descriptors / Indicators: Tightness Pain Intervention(s): Monitored during session;Repositioned    Home Living Family/patient expects to be discharged to::  Private residence Living Arrangements: Spouse/significant other             Home Equipment: Shower seat;Cane - single point;Walker - 2 wheels      Prior Function Level of Independence: Independent with assistive device(s)         Comments: pt has a garden and mows.  Uses cane most of the time     Hand Dominance   Dominant Hand: Right    Extremity/Trunk Assessment   Upper Extremity Assessment Upper Extremity Assessment: Defer to OT evaluation    Lower Extremity Assessment Lower Extremity Assessment: Generalized weakness    Cervical / Trunk Assessment Cervical / Trunk Assessment: Normal  Communication   Communication: No difficulties  Cognition Arousal/Alertness: Awake/alert Behavior During Therapy: WFL for tasks assessed/performed Overall Cognitive Status: Within Functional Limits for tasks assessed                                        General Comments      Exercises     Assessment/Plan    PT Assessment Patient needs continued PT services  PT Problem List Decreased mobility       PT Treatment Interventions DME instruction;Gait training;Therapeutic exercise;Therapeutic activities;Patient/family education;Functional mobility training    PT Goals (Current goals can be found in the Care Plan section)  Acute Rehab PT Goals Patient Stated Goal:  return to independence.hopefully home soon PT Goal Formulation: With patient Time For Goal Achievement: 05/08/19 Potential to Achieve Goals: Good    Frequency Min 3X/week   Barriers to discharge        Co-evaluation               AM-PAC PT "6 Clicks" Mobility  Outcome Measure Help needed turning from your back to your side while in a flat bed without using bedrails?: A Little Help needed moving from lying on your back to sitting on the side of a flat bed without using bedrails?: A Little Help needed moving to and from a bed to a chair (including a wheelchair)?: A Little Help needed  standing up from a chair using your arms (e.g., wheelchair or bedside chair)?: A Little Help needed to walk in hospital room?: A Little Help needed climbing 3-5 steps with a railing? : A Little 6 Click Score: 18    End of Session Equipment Utilized During Treatment: Gait belt Activity Tolerance: Patient tolerated treatment well Patient left: in chair;with call bell/phone within reach   PT Visit Diagnosis: Muscle weakness (generalized) (M62.81)    Time: 0932-3557 PT Time Calculation (min) (ACUTE ONLY): 12 min   Charges:   PT Evaluation $PT Eval Moderate Complexity: [place], PT Acute Rehabilitation Services Pager: [phone] Office: [phone]

## 2019-04-25 ENCOUNTER — Encounter (HOSPITAL_COMMUNITY): Payer: Self-pay | Admitting: Gastroenterology

## 2019-04-26 NOTE — Discharge Summary (Signed)
Physician Discharge Summary  Nathaniel [name] BZJ:[id number] DOB: [birth year] DOA: 04/21/2019  PCP: Nathaniel Frees, MD  Admit date: 04/21/2019 Discharge date: 04/26/2019  Admitted From: Home Disposition: Home  Recommendations for Outpatient Follow-up:  1. Follow up with PCP in 1-2 weeks 2. Follow up with Gastroenterology with 4 weeks 3. Please obtain CMP/CBC, Mag, Phos in one week 4. Please follow up on the following pending results:  Home Health: Non Equipment/Devices: None    Discharge Condition: Stable CODE STATUS: FULL CODE  Diet recommendation: Soft Diet  Brief/Interim Summary: HPI per Dr. Mitzi [name] on 04/21/2019 Nathaniel [name] a 84 y.o.malewith medical history significant forrheumatoid arthritis, chronic pain, hypertension, and glaucoma with left eye blindness, now presenting to emergency department for evaluation of hematemesis and melena.Patient reports that he went to bed last night in his usual state of health, noted some mild nausea early today, went on to have a bowel movement that he describes as very dark, and then later had one episode of vomiting dark red blood. He feels weak and a little lightheaded when sitting up or standing, but has not passed out and denies any chest pain or palpitations. He has never had these symptoms previously. He denies any abdominal pain or fevers. Reports using Aleve most days. Denies alcohol use or history of liver disease. Reports that he has had colonoscopy performed previously by Dr. Shana Chute but has never had EGD.Takes Plavix daily. Does not use a PPI or H-2 blocker.  ED Course:Upon arrival to the ED, patient is found to be afebrile, saturating well on room air, tachycardic to 115, and with stable blood pressure. Chemistry panel is notable for an elevated BUN to creatinine ratio and CBC features a macrocytic anemia with hemoglobin 11.0. There was no gross blood on DRE per the ED physician, but FOBT is positive. Hospitalists are  asked to admit.  **Interim History Gastroenterology was consulted and patient went down for an EGD done and showed a benign-appearing esophageal stenosis as well as a single nonbleeding angiodysplastic lesion in the stomach which was treated with argon plasma as well as 3 angiodysplastic lesions in the duodenum which was treated with argon plasma coagulation.  GI recommended continuing Protonix drip for now and then transition to p.o. Protonix daily afterwards and holding Plavix for at least 5 days.  It was advanced and he tolerated it well.  PT OT was consulted and they recommended to follow.  Patient no further bleeding episodes and hemoglobin/hematocrit was repeated and is higher than this morning.  He is deemed stable for discharge need to follow-up with PCP and gastroenterology in outpatient setting  Discharge Diagnoses:  Principal Problem:   Acute upper GI bleed Active Problems:   Acute upper GI bleeding   Rheumatoid arthritis (HCC)   Chronic pain   Chronic combined systolic and diastolic CHF (congestive heart failure) (HCC)   Macrocytic anemia   Hematemesis with nausea   Melena  Acute upper GI bleeding in the setting of Clopidogrel and NSAID Usage  Macrocytic Anemia -Presented with one day of melena and hematemesis x1 -He is mildly tachycardic with stable BP and Hgb of 11.0 -Hgb/Hct went from 11.0/36.6 -> 10.6/33.6-> 9.3/20.4 -> 9.5/33.0 -> 9.1/32.0 -> 8.5/27.7 -> 8.8/28.6 -Abdomen is soft and non-tender -He was observed to have small amount of clear vomitus in ED, no bleeding since arrival -Started on Protonix drip and this was changed to Protonix 40 mg po Daily by GI and will continue at D/C  -Gastroenterology took the patient  up for an EGD which showed a single angiodysplastic nonbleeding lesion in the stomach which was treated with argon plasma as well as 3 angiodysplastic lesion in the duodenum which was also treated with argon plasma coagulation -Diet advanced to Soft  without issue  -Holding Plavix for at least 5 days (resuming 04/27/2019) as well as follow-up with Gastroenterology within 1 month -GI also recommending avoiding NSAIDs, aspirin, ibuprofen naproxen, as well as others nonsteroidal drugs -Patient appears to be significantly weak so will obtain PT/OT Consultation -PT/OT Recommending no Follow up   Chronic Combined Systolic & Diastolic CHF -Appears compensated on admission and is being hydrated in setting of acute UGIB with tachycardia -Follow daily wt and strict I/O's -Patient is +2.684 L since admission -Fluids were changed to lactated Ringer's at 75 mL's per hour and stopped -Continue to Monitor Volume Status as an outpatient   Rheumatoid Arthritis; chronic pain -Managed at home with methotrexate, baclofen, gabapentin, Norco, Alleve, and topical Voltaren at home -Currently holding all his medications bruised resume gabapentin and baclofen as well as Norco for now but will hold if becomes pre-syncopal -No pain complaints on admission  -Stop NSAID's, -Follow up with PCP   Hypertension -BP starting to become more elevated so will resume Lisinopril in AM  -BP is now 138/69 -Hold scheduled BP meds in setting of acute GIB, treat as-needed  Glaucoma  -Continue with dorzolamide-timolol 22.3-6.8 mg/mL 1 drop both eyes twice daily per ophthalmic solution along with latanoprost 3.005% ophthalmic solution 1 drop in both eyes nightly -Resumed home brimonidine 0.15% ophthalmic solution 1 drop in both eyes twice daily  Seasonal Allergies  -Continue with sertraline substitution with loratadine 10 mg p.o. daily  Thrombocytopenia -Patient's platelet count went from 188 -> 115 -> 116 -Continue to Monitor for S/Sx of bleeding; Currently no overt bleeding noted -Repeat CBC as an outpatient   Hypophosphatemia -Patient's phosphorus levels morning was 3.9 -Continue to monitor and replete as necessary -Repeat phosphorus level as an  outpatient   Hypoglycemia -With mild at 65 this morning his blood sugars have been ranging from 90-100 -Diet advancement to Soft Diet  -Continue to Monitor and Trend -Repeat blood sugars in a.m.; Ranged from 79-100  Discharge Instructions  Discharge Instructions    Call MD for:  difficulty breathing, headache or visual disturbances   Complete by: As directed    Call MD for:  extreme fatigue   Complete by: As directed    Call MD for:  hives   Complete by: As directed    Call MD for:  persistant dizziness or light-headedness   Complete by: As directed    Call MD for:  persistant nausea and vomiting   Complete by: As directed    Call MD for:  redness, tenderness, or signs of infection (pain, swelling, redness, odor or green/yellow discharge around incision site)   Complete by: As directed    Call MD for:  severe uncontrolled pain   Complete by: As directed    Call MD for:  temperature >100.4   Complete by: As directed    Diet - low sodium heart healthy   Complete by: As directed    Discharge instructions   Complete by: As directed    You were cared for by a hospitalist during your hospital stay. If you have any questions about your discharge medications or the care you received while you were in the hospital after you are discharged, you can call the unit and ask to speak with  the hospitalist on call if the hospitalist that took care of you is not available. Once you are discharged, your primary care physician will handle any further medical issues. Please note that NO REFILLS for any discharge medications will be authorized once you are discharged, as it is imperative that you return to your primary care physician (or establish a relationship with a primary care physician if you do not have one) for your aftercare needs so that they can reassess your need for medications and monitor your lab values.  Follow up with PCP and Gastroenterology. Take all medications as prescribed. If  symptoms change or worsen please return to the ED for evaluation   Increase activity slowly   Complete by: As directed      Allergies as of 04/24/2019      Reactions   Ibuprofen Hives   Conflict with other meds   Tylenol [acetaminophen] Rash      Medication List    TAKE these medications   baclofen 10 MG tablet Commonly known as: LIORESAL Take 1 tablet (10 mg total) by mouth 2 (two) times daily. Notes to patient: 04/24/19, evening   brimonidine 0.15 % ophthalmic solution Commonly known as: ALPHAGAN Place 1 drop into both eyes 2 (two) times daily. Notes to patient: 04/24/19, evening   cetirizine 10 MG tablet Commonly known as: ZYRTEC Take 10 mg by mouth daily. Notes to patient: 04/25/19   clopidogrel 75 MG tablet Commonly known as: PLAVIX Take 1 tablet (75 mg total) by mouth daily. Start taking on: April 27, 2019   diclofenac sodium 1 % Gel Commonly known as: VOLTAREN Apply 1 application topically 4 (four) times daily as needed (pain). Notes to patient: 04/24/19   dorzolamide-timolol 22.3-6.8 MG/ML ophthalmic solution Commonly known as: COSOPT Place 1 drop into both eyes 2 (two) times daily. Notes to patient: 04/24/19, evening   FIBER ADULT GUMMIES PO Take 1 tablet by mouth daily. Notes to patient: 6/59/93   folic acid 1 MG tablet Commonly known as: FOLVITE Take 1 mg by mouth at bedtime. Notes to patient: 04/24/19   gabapentin 800 MG tablet Commonly known as: NEURONTIN Take 800 mg by mouth 2 (two) times daily. Notes to patient: 04/24/19, evening   HYDROcodone-acetaminophen 10-325 MG tablet Commonly known as: NORCO Take 1 tablet by mouth at bedtime as needed for moderate pain. Notes to patient: 04/24/19   lisinopril 20 MG tablet Commonly known as: ZESTRIL Take 20 mg by mouth at bedtime. Notes to patient: 04/24/19   methotrexate 50 MG/2ML injection Inject 25 mg into the skin once a week. Notes to patient: Resume at home schedule   ondansetron 4 MG  tablet Commonly known as: ZOFRAN Take 1 tablet (4 mg total) by mouth every 6 (six) hours as needed for nausea.   pantoprazole 40 MG tablet Commonly known as: PROTONIX Take 1 tablet (40 mg total) by mouth daily. Notes to patient: 04/25/19   Travoprost (BAK Free) 0.004 % Soln ophthalmic solution Commonly known as: TRAVATAN Place 1 drop into both eyes at bedtime. Notes to patient: 04/24/19   TURMERIC PO Take 1 capsule by mouth daily. Notes to patient: 04/24/19   Vitamin E Complete Caps Take 1 capsule by mouth daily as needed (for supplementation). Notes to patient: 04/24/19      Follow-up Information    Nathaniel Frees, MD. Call.   Specialty: Family Medicine Why: Follow up within 1 week  Contact information: [place] [address] [phone]  Hale Bogus., MD. Call.   Specialty: Gastroenterology Why: Follow up within 4 weeks  Contact information: [address] [place] [address]        Hale Bogus., MD. Call.   Specialty: Gastroenterology Why: Follow up within 4 weeks Contact information: [address] [place] [address] [phone]          Allergies  Allergen Reactions  . Ibuprofen Hives    Conflict with other meds  . Tylenol [Acetaminophen] Rash   Consultations:  Gastroenterology  Procedures/Studies:  No results found. EGD Findings: One benign-appearing, intrinsic moderate stenosis was found at the  gastroesophageal junction. This stenosis measured 1.4 cm (inner  diameter). The stenosis was traversed. The exam of the esophagus was otherwise normal. A single 3 mm angiodysplastic lesion with no bleeding was found in the  gastric fundus. Coagulation to prevent bleeding by argon plasma was  successful. Hematin and solid (altered blood/coffee-ground-like material) and solid  food was found in the gastric fundus, in the  gastric body and in the  gastric antrum which significantly limited the exam with several areas  not seen. A small hiatal hernia was present. The exam of the stomach was otherwise normal with limited views as  outlined above. Three 2 to 5 mm angiodysplastic lesions with bleeding on contact from  the 5 mm angiodysplasia were found in the duodenal bulb. Coagulation for  bleeding prevention using argon plasma was successful. The second portion of the duodenum was normal. Impression: - Benign-appearing esophageal stenosis. - A single non-bleeding angiodysplastic lesion in  the stomach. Treated with argon plasma coagulation  (APC). - Hematin (altered blood/coffee-ground-like  material) and solid food in the gastric fundus, in  the gastric body and in the gastric antrum. - Small hiatal hernia. - Three angiodysplastic lesions in the duodenum.  Treated with argon plasma coagulation (APC). - Normal second portion of the duodenum. - No specimens collected.   Subjective: Seen and examined at bedside around 8 chest pain, shortness breath, lightheadedness or dizziness.  Tolerated breakfast well and states it was "good".  No nausea or vomiting no other concerns complaints at this time and ready to go home.  Discharge Exam: Vitals:   04/24/19 0452 04/24/19 1408  BP: (!) 151/78 138/69  Pulse: 80 74  Resp: 20 18  Temp: 98.4 F (36.9 C) 98.9 F (37.2 C)  SpO2: 96% 100%   Vitals:   04/23/19 1343 04/23/19 1942 04/24/19 0452 04/24/19 1408  BP: (!) 150/80 (!) 152/75 (!) 151/78 138/69  Pulse: 73 73 80 74  Resp: 16 (!) 21 20 18   Temp: 98.3 F (36.8 C) 98.2  F (36.8 C) 98.4 F (36.9 C) 98.9 F (37.2 C)  TempSrc: Oral Oral Oral Oral  SpO2: 99% 98% 96% 100%  Weight:   85.1 kg   Height:       General: Pt is alert, awake, not in acute distress Cardiovascular: RRR, S1/S2 +, no rubs, no gallops Respiratory: CTA bilaterally, no wheezing, no rhonchi Abdominal: Soft, NT, ND, bowel sounds + Extremities: Trace edema, no cyanosis  The results of significant diagnostics from this hospitalization (including imaging, microbiology, ancillary and laboratory) are listed below for reference.    Microbiology: Recent Results (from the past 240 hour(s))  Novel Coronavirus,NAA,(SEND-OUT TO REF LAB - TAT 24-48 hrs); Hosp Order     Status: None   Collection Time: 04/21/19  9:19 PM   Specimen: Nasopharyngeal Swab; Respiratory  Result Value Ref  Range Status   SARS-CoV-2, NAA NOT DETECTED NOT DETECTED Final    Comment: (NOTE) This test was developed and its performance characteristics determined by [employer]. This test has not been FDA cleared or approved. This test has been authorized by FDA under an Emergency Use Authorization (EUA). This test is only authorized for the duration of time the declaration that circumstances exist justifying the authorization of the emergency use of in vitro diagnostic tests for detection of SARS-CoV-2 virus and/or diagnosis of COVID-19 infection under section 564(b)(1) of the Act, 21 U.S.C. 818HUD-1(S)(9), unless the authorization is terminated or revoked sooner. When diagnostic testing is negative, the possibility of a false negative result should be considered in the context of a patient's recent exposures and the presence of clinical signs and symptoms consistent with COVID-19. An individual without symptoms of COVID-19 and who is not shedding SARS-CoV-2 virus would expect to have a negative (not detected) result in this assay. Performed  At: [facility] [place], [place]  [id number] Rush Farmer MD IF:[id number]    [place]  Final    Comment: Performed at [facility] [address] [place], [place] [address]  SARS Coronavirus 2 (CEPHEID - Performed in [place] hospital lab), Hosp Order     Status: None   Collection Time: 04/22/19  8:47 AM   Specimen: Nasopharyngeal Swab  Result Value Ref Range Status   SARS Coronavirus 2 NEGATIVE NEGATIVE Final    Comment: (NOTE) If result is NEGATIVE SARS-CoV-2 target nucleic acids are NOT DETECTED. The SARS-CoV-2 RNA is generally detectable in upper and lower  respiratory specimens during the acute phase of infection. The lowest  concentration of SARS-CoV-2 viral copies this assay can detect is 250  copies / mL. A negative result does not preclude SARS-CoV-2 infection  and should not be used as the sole basis for treatment or other  patient management decisions.  A negative result may occur with  improper specimen collection / handling, submission of specimen other  than nasopharyngeal swab, presence of viral mutation(s) within the  areas targeted by this assay, and inadequate number of viral copies  (<250 copies / mL). A negative result must be combined with clinical  observations, patient history, and epidemiological information. If result is POSITIVE SARS-CoV-2 target nucleic acids are DETECTED. The SARS-CoV-2 RNA is generally detectable in upper and lower  respiratory specimens dur ing the acute phase of infection.  Positive  results are indicative of active infection with SARS-CoV-2.  Clinical  correlation with patient history and other diagnostic information is  necessary to determine patient infection status.  Positive results do  not rule out bacterial infection or co-infection with other viruses. If result is PRESUMPTIVE POSTIVE SARS-CoV-2 nucleic acids MAY BE PRESENT.   A presumptive positive result was obtained on the submitted specimen  and confirmed  on repeat testing.  While 2019 novel coronavirus  (SARS-CoV-2) nucleic acids may be present in the submitted sample  additional confirmatory testing may be necessary for epidemiological  and / or clinical management purposes  to differentiate between  SARS-CoV-2 and other Sarbecovirus currently known to infect humans.  If clinically indicated additional testing with an alternate test  methodology [phone]) is advised. The SARS-CoV-2 RNA is generally  detectable in upper and lower respiratory sp ecimens during the acute  phase of infection. The expected result is Negative. Fact Sheet for Patients:  StrictlyIdeas.no Fact Sheet for Healthcare Providers: BankingDealers.co.za This test is not yet approved  or cleared by the [place] and has been authorized for detection and/or diagnosis of SARS-CoV-2 by FDA under an Emergency Use Authorization (EUA).  This EUA will remain in effect (meaning this test can be used) for the duration of the COVID-19 declaration under Section 564(b)(1) of the Act, 21 U.S.C. section 360bbb-3(b)(1), unless the authorization is terminated or revoked sooner. Performed at [facility], [place] [address] [place], [place] [address]     Labs: BNP (last 3 results) No results for input(s): BNP in the last 8760 hours. Basic Metabolic Panel: Recent Labs  Lab 04/21/19 1938 04/22/19 0011 04/23/19 0328 04/24/19 0412  NA 144 145 140 142  K 3.8 3.9 3.6 3.3*  CL 114* 118* 111 114*  CO2 23 20* 20* 21*  GLUCOSE 147* 114* 79 86  BUN 32* 31* 18 12  CREATININE 0.86 0.86 0.83 0.89  CALCIUM 9.3 8.8* 7.7* 8.5*  MG  --   --  2.0 1.9  PHOS  --   --  2.1* 3.9   Liver Function Tests: Recent Labs  Lab 04/21/19 1938 04/23/19 0328 04/24/19 0412  AST 18 25 19   ALT 15 16 14   ALKPHOS 83 63 63  BILITOT 0.6 0.7 0.3  PROT 7.2 5.5* 5.3*  ALBUMIN 4.0 3.1* 3.1*   No results for input(s): LIPASE, AMYLASE in  the last 168 hours. No results for input(s): AMMONIA in the last 168 hours. CBC: Recent Labs  Lab 04/21/19 1938  04/22/19 0834 04/22/19 1548 04/23/19 0328 04/24/19 0412 04/24/19 1143  WBC 7.4  --   --   --  9.7 7.9 8.8  NEUTROABS  --   --   --   --  6.5 5.3  --   HGB 11.0*   < > 9.3* 9.5* 9.1* 8.5* 8.8*  HCT 36.6*   < > 30.4* 33.0* 32.0* 27.7* 28.6*  MCV 103.4*  --   --   --  109.6* 103.4* 102.5*  PLT 188  --   --   --  115* 110* 116*   < > = values in this interval not displayed.   Cardiac Enzymes: No results for input(s): CKTOTAL, CKMB, CKMBINDEX, TROPONINI in the last 168 hours. BNP: Invalid input(s): POCBNP CBG: Recent Labs  Lab 04/22/19 0728 04/23/19 0758 04/23/19 0907 04/24/19 0759  GLUCAP 90 65* 100* 79   D-Dimer No results for input(s): DDIMER in the last 72 hours. Hgb A1c No results for input(s): HGBA1C in the last 72 hours. Lipid Profile No results for input(s): CHOL, HDL, LDLCALC, TRIG, CHOLHDL, LDLDIRECT in the last 72 hours. Thyroid function studies No results for input(s): TSH, T4TOTAL, T3FREE, THYROIDAB in the last 72 hours.  Invalid input(s): FREET3 Anemia work up No results for input(s): VITAMINB12, FOLATE, FERRITIN, TIBC, IRON, RETICCTPCT in the last 72 hours. Urinalysis    Component Value Date/Time   COLORURINE YELLOW 04/17/2018 1202   APPEARANCEUR CLEAR 04/17/2018 1202   LABSPEC 1.017 04/17/2018 1202   PHURINE 6.0 04/17/2018 1202   GLUCOSEU NEGATIVE 04/17/2018 1202   HGBUR NEGATIVE 04/17/2018 1202   BILIRUBINUR NEGATIVE 04/17/2018 1202   KETONESUR NEGATIVE 04/17/2018 1202   PROTEINUR NEGATIVE 04/17/2018 1202   UROBILINOGEN 0.2 04/19/2015 1010   NITRITE NEGATIVE 04/17/2018 1202   LEUKOCYTESUR TRACE (A) 04/17/2018 1202   Sepsis Labs Invalid input(s): PROCALCITONIN,  WBC,  LACTICIDVEN Microbiology Recent Results (from the past 240 hour(s))  Novel Coronavirus,NAA,(SEND-OUT TO REF LAB - TAT 24-48 hrs); Hosp Order     Status:  None    Collection Time: 04/21/19  9:19 PM   Specimen: Nasopharyngeal Swab; Respiratory  Result Value Ref Range Status   SARS-CoV-2, NAA NOT DETECTED NOT DETECTED Final    Comment: (NOTE) This test was developed and its performance characteristics determined by [employer]. This test has not been FDA cleared or approved. This test has been authorized by FDA under an Emergency Use Authorization (EUA). This test is only authorized for the duration of time the declaration that circumstances exist justifying the authorization of the emergency use of in vitro diagnostic tests for detection of SARS-CoV-2 virus and/or diagnosis of COVID-19 infection under section 564(b)(1) of the Act, 21 U.S.C. 979GXQ-1(J)(9), unless the authorization is terminated or revoked sooner. When diagnostic testing is negative, the possibility of a false negative result should be considered in the context of a patient's recent exposures and the presence of clinical signs and symptoms consistent with COVID-19. An individual without symptoms of COVID-19 and who is not shedding SARS-CoV-2 virus would expect to have a negative (not detected) result in this assay. Performed  At: [facility] [place], [place] [id number] Rush Farmer MD YJ:[id number]    [place]  Final    Comment: Performed at [facility] [address] [place], [place] [address]  SARS Coronavirus 2 (CEPHEID - Performed in [facility] hospital lab), Hosp Order     Status: None   Collection Time: 04/22/19  8:47 AM   Specimen: Nasopharyngeal Swab  Result Value Ref Range Status   SARS Coronavirus 2 NEGATIVE NEGATIVE Final    Comment: (NOTE) If result is NEGATIVE SARS-CoV-2 target nucleic acids are NOT DETECTED. The SARS-CoV-2 RNA is generally detectable in upper and lower  respiratory specimens during the acute phase of infection. The lowest  concentration of SARS-CoV-2 viral copies  this assay can detect is 250  copies / mL. A negative result does not preclude SARS-CoV-2 infection  and should not be used as the sole basis for treatment or other  patient management decisions.  A negative result may occur with  improper specimen collection / handling, submission of specimen other  than nasopharyngeal swab, presence of viral mutation(s) within the  areas targeted by this assay, and inadequate number of viral copies  (<250 copies / mL). A negative result must be combined with clinical  observations, patient history, and epidemiological information. If result is POSITIVE SARS-CoV-2 target nucleic acids are DETECTED. The SARS-CoV-2 RNA is generally detectable in upper and lower  respiratory specimens dur ing the acute phase of infection.  Positive  results are indicative of active infection with SARS-CoV-2.  Clinical  correlation with patient history and other diagnostic information is  necessary to determine patient infection status.  Positive results do  not rule out bacterial infection or co-infection with other viruses. If result is PRESUMPTIVE POSTIVE SARS-CoV-2 nucleic acids MAY BE PRESENT.   A presumptive positive result was obtained on the submitted specimen  and confirmed on repeat testing.  While 2019 novel coronavirus  (SARS-CoV-2) nucleic acids may be present in the submitted sample  additional confirmatory testing may be necessary for epidemiological  and / or clinical management purposes  to differentiate between  SARS-CoV-2 and other Sarbecovirus currently known to infect humans.  If clinically indicated additional testing with an alternate test  methodology [phone]) is advised. The SARS-CoV-2 RNA is generally  detectable in upper and lower respiratory sp ecimens during the acute  phase of infection. The expected result is  Negative. Fact Sheet for Patients:  StrictlyIdeas.no Fact Sheet for Healthcare  Providers: BankingDealers.co.za This test is not yet approved or cleared by the [place] FDA and has been authorized for detection and/or diagnosis of SARS-CoV-2 by FDA under an Emergency Use Authorization (EUA).  This EUA will remain in effect (meaning this test can be used) for the duration of the COVID-19 declaration under Section 564(b)(1) of the Act, 21 U.S.C. section 360bbb-3(b)(1), unless the authorization is terminated or revoked sooner. Performed at [facility], [place] [address], [place], [place] [address]    Time coordinating discharge: 35 minutes  SIGNED:  Kerney Elbe, DO [facility] 04/26/2019, 9:29 PM Pager is on [place]  If 7PM-7AM, please contact night-coverage [email or web address] Password [email or web address]

## 2019-06-16 ENCOUNTER — Other Ambulatory Visit: Payer: Self-pay | Admitting: Rheumatology

## 2019-06-16 DIAGNOSIS — M7989 Other specified soft tissue disorders: Secondary | ICD-10-CM

## 2019-06-17 ENCOUNTER — Ambulatory Visit
Admission: RE | Admit: 2019-06-17 | Discharge: 2019-06-17 | Disposition: A | Discharge status: Home / Self Care | Payer: Medicare Other | Source: Ambulatory Visit | Attending: Rheumatology | Admitting: Rheumatology

## 2019-06-17 ENCOUNTER — Other Ambulatory Visit: Payer: Self-pay

## 2019-06-17 DIAGNOSIS — M7989 Other specified soft tissue disorders: Secondary | ICD-10-CM

## 2020-11-06 DIAGNOSIS — Z79899 Other long term (current) drug therapy: Secondary | ICD-10-CM | POA: Diagnosis not present

## 2020-11-06 DIAGNOSIS — M2550 Pain in unspecified joint: Secondary | ICD-10-CM | POA: Diagnosis not present

## 2020-11-06 DIAGNOSIS — M0579 Rheumatoid arthritis with rheumatoid factor of multiple sites without organ or systems involvement: Secondary | ICD-10-CM | POA: Diagnosis not present

## 2020-11-26 DIAGNOSIS — M05842 Other rheumatoid arthritis with rheumatoid factor of left hand: Secondary | ICD-10-CM | POA: Diagnosis not present

## 2020-11-26 DIAGNOSIS — E7800 Pure hypercholesterolemia, unspecified: Secondary | ICD-10-CM | POA: Diagnosis not present

## 2020-11-26 DIAGNOSIS — K219 Gastro-esophageal reflux disease without esophagitis: Secondary | ICD-10-CM | POA: Diagnosis not present

## 2020-11-26 DIAGNOSIS — M1990 Unspecified osteoarthritis, unspecified site: Secondary | ICD-10-CM | POA: Diagnosis not present

## 2020-11-26 DIAGNOSIS — M069 Rheumatoid arthritis, unspecified: Secondary | ICD-10-CM | POA: Diagnosis not present

## 2020-11-26 DIAGNOSIS — D500 Iron deficiency anemia secondary to blood loss (chronic): Secondary | ICD-10-CM | POA: Diagnosis not present

## 2020-11-26 DIAGNOSIS — M05841 Other rheumatoid arthritis with rheumatoid factor of right hand: Secondary | ICD-10-CM | POA: Diagnosis not present

## 2020-11-26 DIAGNOSIS — I10 Essential (primary) hypertension: Secondary | ICD-10-CM | POA: Diagnosis not present

## 2020-11-28 DIAGNOSIS — L89892 Pressure ulcer of other site, stage 2: Secondary | ICD-10-CM | POA: Diagnosis not present

## 2020-11-28 DIAGNOSIS — I739 Peripheral vascular disease, unspecified: Secondary | ICD-10-CM | POA: Diagnosis not present

## 2020-12-04 DIAGNOSIS — E7800 Pure hypercholesterolemia, unspecified: Secondary | ICD-10-CM | POA: Diagnosis not present

## 2020-12-04 DIAGNOSIS — R7303 Prediabetes: Secondary | ICD-10-CM | POA: Diagnosis not present

## 2020-12-04 DIAGNOSIS — I10 Essential (primary) hypertension: Secondary | ICD-10-CM | POA: Diagnosis not present

## 2020-12-04 DIAGNOSIS — M069 Rheumatoid arthritis, unspecified: Secondary | ICD-10-CM | POA: Diagnosis not present

## 2020-12-04 DIAGNOSIS — F5101 Primary insomnia: Secondary | ICD-10-CM | POA: Diagnosis not present

## 2020-12-13 DIAGNOSIS — M25562 Pain in left knee: Secondary | ICD-10-CM | POA: Diagnosis not present

## 2020-12-13 DIAGNOSIS — M1712 Unilateral primary osteoarthritis, left knee: Secondary | ICD-10-CM | POA: Diagnosis not present

## 2020-12-20 DIAGNOSIS — M25562 Pain in left knee: Secondary | ICD-10-CM | POA: Diagnosis not present

## 2020-12-20 DIAGNOSIS — M1712 Unilateral primary osteoarthritis, left knee: Secondary | ICD-10-CM | POA: Diagnosis not present

## 2020-12-27 DIAGNOSIS — M1712 Unilateral primary osteoarthritis, left knee: Secondary | ICD-10-CM | POA: Diagnosis not present

## 2020-12-27 DIAGNOSIS — M25562 Pain in left knee: Secondary | ICD-10-CM | POA: Diagnosis not present

## 2021-01-16 DIAGNOSIS — E7800 Pure hypercholesterolemia, unspecified: Secondary | ICD-10-CM | POA: Diagnosis not present

## 2021-01-16 DIAGNOSIS — M05841 Other rheumatoid arthritis with rheumatoid factor of right hand: Secondary | ICD-10-CM | POA: Diagnosis not present

## 2021-01-16 DIAGNOSIS — M069 Rheumatoid arthritis, unspecified: Secondary | ICD-10-CM | POA: Diagnosis not present

## 2021-01-16 DIAGNOSIS — D500 Iron deficiency anemia secondary to blood loss (chronic): Secondary | ICD-10-CM | POA: Diagnosis not present

## 2021-01-16 DIAGNOSIS — I10 Essential (primary) hypertension: Secondary | ICD-10-CM | POA: Diagnosis not present

## 2021-01-16 DIAGNOSIS — M1990 Unspecified osteoarthritis, unspecified site: Secondary | ICD-10-CM | POA: Diagnosis not present

## 2021-01-16 DIAGNOSIS — M05842 Other rheumatoid arthritis with rheumatoid factor of left hand: Secondary | ICD-10-CM | POA: Diagnosis not present

## 2021-01-16 DIAGNOSIS — K219 Gastro-esophageal reflux disease without esophagitis: Secondary | ICD-10-CM | POA: Diagnosis not present

## 2021-01-30 DIAGNOSIS — M79671 Pain in right foot: Secondary | ICD-10-CM | POA: Diagnosis not present

## 2021-01-30 DIAGNOSIS — L89892 Pressure ulcer of other site, stage 2: Secondary | ICD-10-CM | POA: Diagnosis not present

## 2021-01-30 DIAGNOSIS — I739 Peripheral vascular disease, unspecified: Secondary | ICD-10-CM | POA: Diagnosis not present

## 2021-01-30 DIAGNOSIS — L84 Corns and callosities: Secondary | ICD-10-CM | POA: Diagnosis not present

## 2021-01-30 DIAGNOSIS — M79672 Pain in left foot: Secondary | ICD-10-CM | POA: Diagnosis not present

## 2021-01-30 DIAGNOSIS — B351 Tinea unguium: Secondary | ICD-10-CM | POA: Diagnosis not present

## 2021-02-04 DIAGNOSIS — Z79899 Other long term (current) drug therapy: Secondary | ICD-10-CM | POA: Diagnosis not present

## 2021-02-04 DIAGNOSIS — M150 Primary generalized (osteo)arthritis: Secondary | ICD-10-CM | POA: Diagnosis not present

## 2021-02-04 DIAGNOSIS — M25561 Pain in right knee: Secondary | ICD-10-CM | POA: Diagnosis not present

## 2021-02-04 DIAGNOSIS — M0579 Rheumatoid arthritis with rheumatoid factor of multiple sites without organ or systems involvement: Secondary | ICD-10-CM | POA: Diagnosis not present

## 2021-02-04 DIAGNOSIS — M25579 Pain in unspecified ankle and joints of unspecified foot: Secondary | ICD-10-CM | POA: Diagnosis not present

## 2021-02-04 DIAGNOSIS — M2550 Pain in unspecified joint: Secondary | ICD-10-CM | POA: Diagnosis not present

## 2021-02-21 DIAGNOSIS — I10 Essential (primary) hypertension: Secondary | ICD-10-CM | POA: Diagnosis not present

## 2021-02-21 DIAGNOSIS — D500 Iron deficiency anemia secondary to blood loss (chronic): Secondary | ICD-10-CM | POA: Diagnosis not present

## 2021-02-21 DIAGNOSIS — E7800 Pure hypercholesterolemia, unspecified: Secondary | ICD-10-CM | POA: Diagnosis not present

## 2021-02-21 DIAGNOSIS — K219 Gastro-esophageal reflux disease without esophagitis: Secondary | ICD-10-CM | POA: Diagnosis not present

## 2021-02-21 DIAGNOSIS — M05842 Other rheumatoid arthritis with rheumatoid factor of left hand: Secondary | ICD-10-CM | POA: Diagnosis not present

## 2021-02-21 DIAGNOSIS — M069 Rheumatoid arthritis, unspecified: Secondary | ICD-10-CM | POA: Diagnosis not present

## 2021-02-21 DIAGNOSIS — M1990 Unspecified osteoarthritis, unspecified site: Secondary | ICD-10-CM | POA: Diagnosis not present

## 2021-02-21 DIAGNOSIS — M05841 Other rheumatoid arthritis with rheumatoid factor of right hand: Secondary | ICD-10-CM | POA: Diagnosis not present

## 2021-03-06 DIAGNOSIS — M79604 Pain in right leg: Secondary | ICD-10-CM | POA: Diagnosis not present

## 2021-03-06 DIAGNOSIS — I10 Essential (primary) hypertension: Secondary | ICD-10-CM | POA: Diagnosis not present

## 2021-03-06 DIAGNOSIS — M79605 Pain in left leg: Secondary | ICD-10-CM | POA: Diagnosis not present

## 2021-03-06 DIAGNOSIS — M545 Low back pain, unspecified: Secondary | ICD-10-CM | POA: Diagnosis not present

## 2021-03-06 DIAGNOSIS — R296 Repeated falls: Secondary | ICD-10-CM | POA: Diagnosis not present

## 2021-03-09 DIAGNOSIS — M4800 Spinal stenosis, site unspecified: Secondary | ICD-10-CM | POA: Diagnosis not present

## 2021-03-09 DIAGNOSIS — R7303 Prediabetes: Secondary | ICD-10-CM | POA: Diagnosis not present

## 2021-03-09 DIAGNOSIS — E538 Deficiency of other specified B group vitamins: Secondary | ICD-10-CM | POA: Diagnosis not present

## 2021-03-09 DIAGNOSIS — R296 Repeated falls: Secondary | ICD-10-CM | POA: Diagnosis not present

## 2021-03-09 DIAGNOSIS — M109 Gout, unspecified: Secondary | ICD-10-CM | POA: Diagnosis not present

## 2021-03-09 DIAGNOSIS — M069 Rheumatoid arthritis, unspecified: Secondary | ICD-10-CM | POA: Diagnosis not present

## 2021-03-09 DIAGNOSIS — Z9181 History of falling: Secondary | ICD-10-CM | POA: Diagnosis not present

## 2021-03-09 DIAGNOSIS — I10 Essential (primary) hypertension: Secondary | ICD-10-CM | POA: Diagnosis not present

## 2021-03-09 DIAGNOSIS — Z87891 Personal history of nicotine dependence: Secondary | ICD-10-CM | POA: Diagnosis not present

## 2021-03-09 DIAGNOSIS — H409 Unspecified glaucoma: Secondary | ICD-10-CM | POA: Diagnosis not present

## 2021-03-09 DIAGNOSIS — G8929 Other chronic pain: Secondary | ICD-10-CM | POA: Diagnosis not present

## 2021-03-09 DIAGNOSIS — M1990 Unspecified osteoarthritis, unspecified site: Secondary | ICD-10-CM | POA: Diagnosis not present

## 2021-03-12 DIAGNOSIS — M069 Rheumatoid arthritis, unspecified: Secondary | ICD-10-CM | POA: Diagnosis not present

## 2021-03-12 DIAGNOSIS — R296 Repeated falls: Secondary | ICD-10-CM | POA: Diagnosis not present

## 2021-03-12 DIAGNOSIS — Z9181 History of falling: Secondary | ICD-10-CM | POA: Diagnosis not present

## 2021-03-12 DIAGNOSIS — M1990 Unspecified osteoarthritis, unspecified site: Secondary | ICD-10-CM | POA: Diagnosis not present

## 2021-03-12 DIAGNOSIS — R7303 Prediabetes: Secondary | ICD-10-CM | POA: Diagnosis not present

## 2021-03-12 DIAGNOSIS — M4800 Spinal stenosis, site unspecified: Secondary | ICD-10-CM | POA: Diagnosis not present

## 2021-03-12 DIAGNOSIS — I10 Essential (primary) hypertension: Secondary | ICD-10-CM | POA: Diagnosis not present

## 2021-03-12 DIAGNOSIS — G8929 Other chronic pain: Secondary | ICD-10-CM | POA: Diagnosis not present

## 2021-03-12 DIAGNOSIS — Z87891 Personal history of nicotine dependence: Secondary | ICD-10-CM | POA: Diagnosis not present

## 2021-03-12 DIAGNOSIS — H409 Unspecified glaucoma: Secondary | ICD-10-CM | POA: Diagnosis not present

## 2021-03-12 DIAGNOSIS — M109 Gout, unspecified: Secondary | ICD-10-CM | POA: Diagnosis not present

## 2021-03-12 DIAGNOSIS — E538 Deficiency of other specified B group vitamins: Secondary | ICD-10-CM | POA: Diagnosis not present

## 2021-03-14 DIAGNOSIS — M1990 Unspecified osteoarthritis, unspecified site: Secondary | ICD-10-CM | POA: Diagnosis not present

## 2021-03-14 DIAGNOSIS — M069 Rheumatoid arthritis, unspecified: Secondary | ICD-10-CM | POA: Diagnosis not present

## 2021-03-14 DIAGNOSIS — Z9181 History of falling: Secondary | ICD-10-CM | POA: Diagnosis not present

## 2021-03-14 DIAGNOSIS — M109 Gout, unspecified: Secondary | ICD-10-CM | POA: Diagnosis not present

## 2021-03-14 DIAGNOSIS — E538 Deficiency of other specified B group vitamins: Secondary | ICD-10-CM | POA: Diagnosis not present

## 2021-03-14 DIAGNOSIS — H409 Unspecified glaucoma: Secondary | ICD-10-CM | POA: Diagnosis not present

## 2021-03-14 DIAGNOSIS — R7303 Prediabetes: Secondary | ICD-10-CM | POA: Diagnosis not present

## 2021-03-14 DIAGNOSIS — G8929 Other chronic pain: Secondary | ICD-10-CM | POA: Diagnosis not present

## 2021-03-14 DIAGNOSIS — I10 Essential (primary) hypertension: Secondary | ICD-10-CM | POA: Diagnosis not present

## 2021-03-14 DIAGNOSIS — Z87891 Personal history of nicotine dependence: Secondary | ICD-10-CM | POA: Diagnosis not present

## 2021-03-14 DIAGNOSIS — R296 Repeated falls: Secondary | ICD-10-CM | POA: Diagnosis not present

## 2021-03-14 DIAGNOSIS — M4800 Spinal stenosis, site unspecified: Secondary | ICD-10-CM | POA: Diagnosis not present

## 2021-03-15 DIAGNOSIS — G8929 Other chronic pain: Secondary | ICD-10-CM | POA: Diagnosis not present

## 2021-03-15 DIAGNOSIS — I10 Essential (primary) hypertension: Secondary | ICD-10-CM | POA: Diagnosis not present

## 2021-03-15 DIAGNOSIS — M069 Rheumatoid arthritis, unspecified: Secondary | ICD-10-CM | POA: Diagnosis not present

## 2021-03-15 DIAGNOSIS — R7303 Prediabetes: Secondary | ICD-10-CM | POA: Diagnosis not present

## 2021-03-15 DIAGNOSIS — H409 Unspecified glaucoma: Secondary | ICD-10-CM | POA: Diagnosis not present

## 2021-03-15 DIAGNOSIS — Z87891 Personal history of nicotine dependence: Secondary | ICD-10-CM | POA: Diagnosis not present

## 2021-03-15 DIAGNOSIS — M1990 Unspecified osteoarthritis, unspecified site: Secondary | ICD-10-CM | POA: Diagnosis not present

## 2021-03-15 DIAGNOSIS — M109 Gout, unspecified: Secondary | ICD-10-CM | POA: Diagnosis not present

## 2021-03-15 DIAGNOSIS — Z9181 History of falling: Secondary | ICD-10-CM | POA: Diagnosis not present

## 2021-03-15 DIAGNOSIS — E538 Deficiency of other specified B group vitamins: Secondary | ICD-10-CM | POA: Diagnosis not present

## 2021-03-15 DIAGNOSIS — R296 Repeated falls: Secondary | ICD-10-CM | POA: Diagnosis not present

## 2021-03-15 DIAGNOSIS — M4800 Spinal stenosis, site unspecified: Secondary | ICD-10-CM | POA: Diagnosis not present

## 2021-03-18 DIAGNOSIS — M4800 Spinal stenosis, site unspecified: Secondary | ICD-10-CM | POA: Diagnosis not present

## 2021-03-18 DIAGNOSIS — E538 Deficiency of other specified B group vitamins: Secondary | ICD-10-CM | POA: Diagnosis not present

## 2021-03-18 DIAGNOSIS — Z9181 History of falling: Secondary | ICD-10-CM | POA: Diagnosis not present

## 2021-03-18 DIAGNOSIS — M069 Rheumatoid arthritis, unspecified: Secondary | ICD-10-CM | POA: Diagnosis not present

## 2021-03-18 DIAGNOSIS — Z87891 Personal history of nicotine dependence: Secondary | ICD-10-CM | POA: Diagnosis not present

## 2021-03-18 DIAGNOSIS — H409 Unspecified glaucoma: Secondary | ICD-10-CM | POA: Diagnosis not present

## 2021-03-18 DIAGNOSIS — R296 Repeated falls: Secondary | ICD-10-CM | POA: Diagnosis not present

## 2021-03-18 DIAGNOSIS — I10 Essential (primary) hypertension: Secondary | ICD-10-CM | POA: Diagnosis not present

## 2021-03-18 DIAGNOSIS — G8929 Other chronic pain: Secondary | ICD-10-CM | POA: Diagnosis not present

## 2021-03-18 DIAGNOSIS — M109 Gout, unspecified: Secondary | ICD-10-CM | POA: Diagnosis not present

## 2021-03-18 DIAGNOSIS — R7303 Prediabetes: Secondary | ICD-10-CM | POA: Diagnosis not present

## 2021-03-18 DIAGNOSIS — M1990 Unspecified osteoarthritis, unspecified site: Secondary | ICD-10-CM | POA: Diagnosis not present

## 2021-03-19 DIAGNOSIS — H401112 Primary open-angle glaucoma, right eye, moderate stage: Secondary | ICD-10-CM | POA: Diagnosis not present

## 2021-03-20 DIAGNOSIS — E538 Deficiency of other specified B group vitamins: Secondary | ICD-10-CM | POA: Diagnosis not present

## 2021-03-20 DIAGNOSIS — I10 Essential (primary) hypertension: Secondary | ICD-10-CM | POA: Diagnosis not present

## 2021-03-20 DIAGNOSIS — R7303 Prediabetes: Secondary | ICD-10-CM | POA: Diagnosis not present

## 2021-03-20 DIAGNOSIS — M1990 Unspecified osteoarthritis, unspecified site: Secondary | ICD-10-CM | POA: Diagnosis not present

## 2021-03-20 DIAGNOSIS — G8929 Other chronic pain: Secondary | ICD-10-CM | POA: Diagnosis not present

## 2021-03-20 DIAGNOSIS — R296 Repeated falls: Secondary | ICD-10-CM | POA: Diagnosis not present

## 2021-03-20 DIAGNOSIS — M109 Gout, unspecified: Secondary | ICD-10-CM | POA: Diagnosis not present

## 2021-03-20 DIAGNOSIS — H409 Unspecified glaucoma: Secondary | ICD-10-CM | POA: Diagnosis not present

## 2021-03-20 DIAGNOSIS — Z9181 History of falling: Secondary | ICD-10-CM | POA: Diagnosis not present

## 2021-03-20 DIAGNOSIS — M4800 Spinal stenosis, site unspecified: Secondary | ICD-10-CM | POA: Diagnosis not present

## 2021-03-20 DIAGNOSIS — M069 Rheumatoid arthritis, unspecified: Secondary | ICD-10-CM | POA: Diagnosis not present

## 2021-03-20 DIAGNOSIS — Z87891 Personal history of nicotine dependence: Secondary | ICD-10-CM | POA: Diagnosis not present

## 2021-03-25 DIAGNOSIS — Z9181 History of falling: Secondary | ICD-10-CM | POA: Diagnosis not present

## 2021-03-25 DIAGNOSIS — M4800 Spinal stenosis, site unspecified: Secondary | ICD-10-CM | POA: Diagnosis not present

## 2021-03-25 DIAGNOSIS — R7303 Prediabetes: Secondary | ICD-10-CM | POA: Diagnosis not present

## 2021-03-25 DIAGNOSIS — M109 Gout, unspecified: Secondary | ICD-10-CM | POA: Diagnosis not present

## 2021-03-25 DIAGNOSIS — M1990 Unspecified osteoarthritis, unspecified site: Secondary | ICD-10-CM | POA: Diagnosis not present

## 2021-03-25 DIAGNOSIS — M069 Rheumatoid arthritis, unspecified: Secondary | ICD-10-CM | POA: Diagnosis not present

## 2021-03-25 DIAGNOSIS — Z87891 Personal history of nicotine dependence: Secondary | ICD-10-CM | POA: Diagnosis not present

## 2021-03-25 DIAGNOSIS — I10 Essential (primary) hypertension: Secondary | ICD-10-CM | POA: Diagnosis not present

## 2021-03-25 DIAGNOSIS — H409 Unspecified glaucoma: Secondary | ICD-10-CM | POA: Diagnosis not present

## 2021-03-25 DIAGNOSIS — G8929 Other chronic pain: Secondary | ICD-10-CM | POA: Diagnosis not present

## 2021-03-25 DIAGNOSIS — R296 Repeated falls: Secondary | ICD-10-CM | POA: Diagnosis not present

## 2021-03-25 DIAGNOSIS — E538 Deficiency of other specified B group vitamins: Secondary | ICD-10-CM | POA: Diagnosis not present

## 2021-03-29 DIAGNOSIS — Z9181 History of falling: Secondary | ICD-10-CM | POA: Diagnosis not present

## 2021-03-29 DIAGNOSIS — M1990 Unspecified osteoarthritis, unspecified site: Secondary | ICD-10-CM | POA: Diagnosis not present

## 2021-03-29 DIAGNOSIS — G8929 Other chronic pain: Secondary | ICD-10-CM | POA: Diagnosis not present

## 2021-03-29 DIAGNOSIS — R7303 Prediabetes: Secondary | ICD-10-CM | POA: Diagnosis not present

## 2021-03-29 DIAGNOSIS — R296 Repeated falls: Secondary | ICD-10-CM | POA: Diagnosis not present

## 2021-03-29 DIAGNOSIS — Z87891 Personal history of nicotine dependence: Secondary | ICD-10-CM | POA: Diagnosis not present

## 2021-03-29 DIAGNOSIS — I10 Essential (primary) hypertension: Secondary | ICD-10-CM | POA: Diagnosis not present

## 2021-03-29 DIAGNOSIS — M069 Rheumatoid arthritis, unspecified: Secondary | ICD-10-CM | POA: Diagnosis not present

## 2021-03-29 DIAGNOSIS — M109 Gout, unspecified: Secondary | ICD-10-CM | POA: Diagnosis not present

## 2021-03-29 DIAGNOSIS — H409 Unspecified glaucoma: Secondary | ICD-10-CM | POA: Diagnosis not present

## 2021-03-29 DIAGNOSIS — M4800 Spinal stenosis, site unspecified: Secondary | ICD-10-CM | POA: Diagnosis not present

## 2021-03-29 DIAGNOSIS — E538 Deficiency of other specified B group vitamins: Secondary | ICD-10-CM | POA: Diagnosis not present

## 2021-04-03 DIAGNOSIS — Z9181 History of falling: Secondary | ICD-10-CM | POA: Diagnosis not present

## 2021-04-03 DIAGNOSIS — M109 Gout, unspecified: Secondary | ICD-10-CM | POA: Diagnosis not present

## 2021-04-03 DIAGNOSIS — M4800 Spinal stenosis, site unspecified: Secondary | ICD-10-CM | POA: Diagnosis not present

## 2021-04-03 DIAGNOSIS — M069 Rheumatoid arthritis, unspecified: Secondary | ICD-10-CM | POA: Diagnosis not present

## 2021-04-03 DIAGNOSIS — I10 Essential (primary) hypertension: Secondary | ICD-10-CM | POA: Diagnosis not present

## 2021-04-03 DIAGNOSIS — R296 Repeated falls: Secondary | ICD-10-CM | POA: Diagnosis not present

## 2021-04-03 DIAGNOSIS — Z87891 Personal history of nicotine dependence: Secondary | ICD-10-CM | POA: Diagnosis not present

## 2021-04-03 DIAGNOSIS — E538 Deficiency of other specified B group vitamins: Secondary | ICD-10-CM | POA: Diagnosis not present

## 2021-04-03 DIAGNOSIS — H409 Unspecified glaucoma: Secondary | ICD-10-CM | POA: Diagnosis not present

## 2021-04-03 DIAGNOSIS — R7303 Prediabetes: Secondary | ICD-10-CM | POA: Diagnosis not present

## 2021-04-03 DIAGNOSIS — M1990 Unspecified osteoarthritis, unspecified site: Secondary | ICD-10-CM | POA: Diagnosis not present

## 2021-04-03 DIAGNOSIS — G8929 Other chronic pain: Secondary | ICD-10-CM | POA: Diagnosis not present

## 2021-04-05 DIAGNOSIS — I739 Peripheral vascular disease, unspecified: Secondary | ICD-10-CM | POA: Diagnosis not present

## 2021-04-05 DIAGNOSIS — M79671 Pain in right foot: Secondary | ICD-10-CM | POA: Diagnosis not present

## 2021-04-05 DIAGNOSIS — L89892 Pressure ulcer of other site, stage 2: Secondary | ICD-10-CM | POA: Diagnosis not present

## 2021-04-05 DIAGNOSIS — L84 Corns and callosities: Secondary | ICD-10-CM | POA: Diagnosis not present

## 2021-04-05 DIAGNOSIS — M79672 Pain in left foot: Secondary | ICD-10-CM | POA: Diagnosis not present

## 2021-04-05 DIAGNOSIS — B351 Tinea unguium: Secondary | ICD-10-CM | POA: Diagnosis not present

## 2021-04-06 DIAGNOSIS — Z9181 History of falling: Secondary | ICD-10-CM | POA: Diagnosis not present

## 2021-04-06 DIAGNOSIS — E538 Deficiency of other specified B group vitamins: Secondary | ICD-10-CM | POA: Diagnosis not present

## 2021-04-06 DIAGNOSIS — G8929 Other chronic pain: Secondary | ICD-10-CM | POA: Diagnosis not present

## 2021-04-06 DIAGNOSIS — R7303 Prediabetes: Secondary | ICD-10-CM | POA: Diagnosis not present

## 2021-04-06 DIAGNOSIS — M4800 Spinal stenosis, site unspecified: Secondary | ICD-10-CM | POA: Diagnosis not present

## 2021-04-06 DIAGNOSIS — R296 Repeated falls: Secondary | ICD-10-CM | POA: Diagnosis not present

## 2021-04-06 DIAGNOSIS — Z87891 Personal history of nicotine dependence: Secondary | ICD-10-CM | POA: Diagnosis not present

## 2021-04-06 DIAGNOSIS — H409 Unspecified glaucoma: Secondary | ICD-10-CM | POA: Diagnosis not present

## 2021-04-06 DIAGNOSIS — M069 Rheumatoid arthritis, unspecified: Secondary | ICD-10-CM | POA: Diagnosis not present

## 2021-04-06 DIAGNOSIS — I10 Essential (primary) hypertension: Secondary | ICD-10-CM | POA: Diagnosis not present

## 2021-04-06 DIAGNOSIS — M109 Gout, unspecified: Secondary | ICD-10-CM | POA: Diagnosis not present

## 2021-04-06 DIAGNOSIS — M1990 Unspecified osteoarthritis, unspecified site: Secondary | ICD-10-CM | POA: Diagnosis not present

## 2021-04-09 DIAGNOSIS — M069 Rheumatoid arthritis, unspecified: Secondary | ICD-10-CM | POA: Diagnosis not present

## 2021-04-09 DIAGNOSIS — Z9181 History of falling: Secondary | ICD-10-CM | POA: Diagnosis not present

## 2021-04-09 DIAGNOSIS — R296 Repeated falls: Secondary | ICD-10-CM | POA: Diagnosis not present

## 2021-04-09 DIAGNOSIS — M4800 Spinal stenosis, site unspecified: Secondary | ICD-10-CM | POA: Diagnosis not present

## 2021-04-09 DIAGNOSIS — G8929 Other chronic pain: Secondary | ICD-10-CM | POA: Diagnosis not present

## 2021-04-09 DIAGNOSIS — H409 Unspecified glaucoma: Secondary | ICD-10-CM | POA: Diagnosis not present

## 2021-04-09 DIAGNOSIS — M1990 Unspecified osteoarthritis, unspecified site: Secondary | ICD-10-CM | POA: Diagnosis not present

## 2021-04-09 DIAGNOSIS — E538 Deficiency of other specified B group vitamins: Secondary | ICD-10-CM | POA: Diagnosis not present

## 2021-04-09 DIAGNOSIS — R7303 Prediabetes: Secondary | ICD-10-CM | POA: Diagnosis not present

## 2021-04-09 DIAGNOSIS — Z87891 Personal history of nicotine dependence: Secondary | ICD-10-CM | POA: Diagnosis not present

## 2021-04-09 DIAGNOSIS — I10 Essential (primary) hypertension: Secondary | ICD-10-CM | POA: Diagnosis not present

## 2021-04-09 DIAGNOSIS — M109 Gout, unspecified: Secondary | ICD-10-CM | POA: Diagnosis not present

## 2021-04-11 DIAGNOSIS — M1990 Unspecified osteoarthritis, unspecified site: Secondary | ICD-10-CM | POA: Diagnosis not present

## 2021-04-11 DIAGNOSIS — R296 Repeated falls: Secondary | ICD-10-CM | POA: Diagnosis not present

## 2021-04-11 DIAGNOSIS — M4800 Spinal stenosis, site unspecified: Secondary | ICD-10-CM | POA: Diagnosis not present

## 2021-04-11 DIAGNOSIS — R7303 Prediabetes: Secondary | ICD-10-CM | POA: Diagnosis not present

## 2021-04-11 DIAGNOSIS — I10 Essential (primary) hypertension: Secondary | ICD-10-CM | POA: Diagnosis not present

## 2021-04-11 DIAGNOSIS — M069 Rheumatoid arthritis, unspecified: Secondary | ICD-10-CM | POA: Diagnosis not present

## 2021-04-11 DIAGNOSIS — H409 Unspecified glaucoma: Secondary | ICD-10-CM | POA: Diagnosis not present

## 2021-04-11 DIAGNOSIS — Z9181 History of falling: Secondary | ICD-10-CM | POA: Diagnosis not present

## 2021-04-11 DIAGNOSIS — Z87891 Personal history of nicotine dependence: Secondary | ICD-10-CM | POA: Diagnosis not present

## 2021-04-11 DIAGNOSIS — M109 Gout, unspecified: Secondary | ICD-10-CM | POA: Diagnosis not present

## 2021-04-11 DIAGNOSIS — E538 Deficiency of other specified B group vitamins: Secondary | ICD-10-CM | POA: Diagnosis not present

## 2021-04-11 DIAGNOSIS — G8929 Other chronic pain: Secondary | ICD-10-CM | POA: Diagnosis not present

## 2021-04-15 DIAGNOSIS — M1990 Unspecified osteoarthritis, unspecified site: Secondary | ICD-10-CM | POA: Diagnosis not present

## 2021-04-15 DIAGNOSIS — Z9181 History of falling: Secondary | ICD-10-CM | POA: Diagnosis not present

## 2021-04-15 DIAGNOSIS — E538 Deficiency of other specified B group vitamins: Secondary | ICD-10-CM | POA: Diagnosis not present

## 2021-04-15 DIAGNOSIS — R7303 Prediabetes: Secondary | ICD-10-CM | POA: Diagnosis not present

## 2021-04-15 DIAGNOSIS — R296 Repeated falls: Secondary | ICD-10-CM | POA: Diagnosis not present

## 2021-04-15 DIAGNOSIS — I10 Essential (primary) hypertension: Secondary | ICD-10-CM | POA: Diagnosis not present

## 2021-04-15 DIAGNOSIS — M069 Rheumatoid arthritis, unspecified: Secondary | ICD-10-CM | POA: Diagnosis not present

## 2021-04-15 DIAGNOSIS — M4800 Spinal stenosis, site unspecified: Secondary | ICD-10-CM | POA: Diagnosis not present

## 2021-04-15 DIAGNOSIS — M109 Gout, unspecified: Secondary | ICD-10-CM | POA: Diagnosis not present

## 2021-04-15 DIAGNOSIS — Z87891 Personal history of nicotine dependence: Secondary | ICD-10-CM | POA: Diagnosis not present

## 2021-04-15 DIAGNOSIS — H409 Unspecified glaucoma: Secondary | ICD-10-CM | POA: Diagnosis not present

## 2021-04-15 DIAGNOSIS — G8929 Other chronic pain: Secondary | ICD-10-CM | POA: Diagnosis not present

## 2021-04-17 DIAGNOSIS — G8929 Other chronic pain: Secondary | ICD-10-CM | POA: Diagnosis not present

## 2021-04-17 DIAGNOSIS — I10 Essential (primary) hypertension: Secondary | ICD-10-CM | POA: Diagnosis not present

## 2021-04-17 DIAGNOSIS — M069 Rheumatoid arthritis, unspecified: Secondary | ICD-10-CM | POA: Diagnosis not present

## 2021-04-17 DIAGNOSIS — M4800 Spinal stenosis, site unspecified: Secondary | ICD-10-CM | POA: Diagnosis not present

## 2021-04-17 DIAGNOSIS — Z87891 Personal history of nicotine dependence: Secondary | ICD-10-CM | POA: Diagnosis not present

## 2021-04-17 DIAGNOSIS — M109 Gout, unspecified: Secondary | ICD-10-CM | POA: Diagnosis not present

## 2021-04-17 DIAGNOSIS — R296 Repeated falls: Secondary | ICD-10-CM | POA: Diagnosis not present

## 2021-04-17 DIAGNOSIS — H409 Unspecified glaucoma: Secondary | ICD-10-CM | POA: Diagnosis not present

## 2021-04-17 DIAGNOSIS — E538 Deficiency of other specified B group vitamins: Secondary | ICD-10-CM | POA: Diagnosis not present

## 2021-04-17 DIAGNOSIS — M1990 Unspecified osteoarthritis, unspecified site: Secondary | ICD-10-CM | POA: Diagnosis not present

## 2021-04-17 DIAGNOSIS — Z9181 History of falling: Secondary | ICD-10-CM | POA: Diagnosis not present

## 2021-04-17 DIAGNOSIS — R7303 Prediabetes: Secondary | ICD-10-CM | POA: Diagnosis not present

## 2021-04-19 DIAGNOSIS — G8929 Other chronic pain: Secondary | ICD-10-CM | POA: Diagnosis not present

## 2021-04-19 DIAGNOSIS — R7303 Prediabetes: Secondary | ICD-10-CM | POA: Diagnosis not present

## 2021-04-19 DIAGNOSIS — R296 Repeated falls: Secondary | ICD-10-CM | POA: Diagnosis not present

## 2021-04-19 DIAGNOSIS — H409 Unspecified glaucoma: Secondary | ICD-10-CM | POA: Diagnosis not present

## 2021-04-19 DIAGNOSIS — M069 Rheumatoid arthritis, unspecified: Secondary | ICD-10-CM | POA: Diagnosis not present

## 2021-04-19 DIAGNOSIS — I10 Essential (primary) hypertension: Secondary | ICD-10-CM | POA: Diagnosis not present

## 2021-04-19 DIAGNOSIS — M4800 Spinal stenosis, site unspecified: Secondary | ICD-10-CM | POA: Diagnosis not present

## 2021-04-19 DIAGNOSIS — E538 Deficiency of other specified B group vitamins: Secondary | ICD-10-CM | POA: Diagnosis not present

## 2021-04-19 DIAGNOSIS — Z87891 Personal history of nicotine dependence: Secondary | ICD-10-CM | POA: Diagnosis not present

## 2021-04-19 DIAGNOSIS — M1990 Unspecified osteoarthritis, unspecified site: Secondary | ICD-10-CM | POA: Diagnosis not present

## 2021-04-19 DIAGNOSIS — M109 Gout, unspecified: Secondary | ICD-10-CM | POA: Diagnosis not present

## 2021-04-19 DIAGNOSIS — Z9181 History of falling: Secondary | ICD-10-CM | POA: Diagnosis not present

## 2021-04-23 DIAGNOSIS — M109 Gout, unspecified: Secondary | ICD-10-CM | POA: Diagnosis not present

## 2021-04-23 DIAGNOSIS — G8929 Other chronic pain: Secondary | ICD-10-CM | POA: Diagnosis not present

## 2021-04-23 DIAGNOSIS — H409 Unspecified glaucoma: Secondary | ICD-10-CM | POA: Diagnosis not present

## 2021-04-23 DIAGNOSIS — R296 Repeated falls: Secondary | ICD-10-CM | POA: Diagnosis not present

## 2021-04-23 DIAGNOSIS — I10 Essential (primary) hypertension: Secondary | ICD-10-CM | POA: Diagnosis not present

## 2021-04-23 DIAGNOSIS — E538 Deficiency of other specified B group vitamins: Secondary | ICD-10-CM | POA: Diagnosis not present

## 2021-04-23 DIAGNOSIS — R7303 Prediabetes: Secondary | ICD-10-CM | POA: Diagnosis not present

## 2021-04-23 DIAGNOSIS — M1990 Unspecified osteoarthritis, unspecified site: Secondary | ICD-10-CM | POA: Diagnosis not present

## 2021-04-23 DIAGNOSIS — M4800 Spinal stenosis, site unspecified: Secondary | ICD-10-CM | POA: Diagnosis not present

## 2021-04-23 DIAGNOSIS — Z9181 History of falling: Secondary | ICD-10-CM | POA: Diagnosis not present

## 2021-04-23 DIAGNOSIS — Z87891 Personal history of nicotine dependence: Secondary | ICD-10-CM | POA: Diagnosis not present

## 2021-04-23 DIAGNOSIS — M069 Rheumatoid arthritis, unspecified: Secondary | ICD-10-CM | POA: Diagnosis not present

## 2021-05-01 DIAGNOSIS — Z87891 Personal history of nicotine dependence: Secondary | ICD-10-CM | POA: Diagnosis not present

## 2021-05-01 DIAGNOSIS — M069 Rheumatoid arthritis, unspecified: Secondary | ICD-10-CM | POA: Diagnosis not present

## 2021-05-01 DIAGNOSIS — M1990 Unspecified osteoarthritis, unspecified site: Secondary | ICD-10-CM | POA: Diagnosis not present

## 2021-05-01 DIAGNOSIS — R7303 Prediabetes: Secondary | ICD-10-CM | POA: Diagnosis not present

## 2021-05-01 DIAGNOSIS — G8929 Other chronic pain: Secondary | ICD-10-CM | POA: Diagnosis not present

## 2021-05-01 DIAGNOSIS — M25462 Effusion, left knee: Secondary | ICD-10-CM | POA: Diagnosis not present

## 2021-05-01 DIAGNOSIS — R296 Repeated falls: Secondary | ICD-10-CM | POA: Diagnosis not present

## 2021-05-01 DIAGNOSIS — M1712 Unilateral primary osteoarthritis, left knee: Secondary | ICD-10-CM | POA: Diagnosis not present

## 2021-05-01 DIAGNOSIS — H409 Unspecified glaucoma: Secondary | ICD-10-CM | POA: Diagnosis not present

## 2021-05-01 DIAGNOSIS — M25562 Pain in left knee: Secondary | ICD-10-CM | POA: Diagnosis not present

## 2021-05-01 DIAGNOSIS — Z9181 History of falling: Secondary | ICD-10-CM | POA: Diagnosis not present

## 2021-05-01 DIAGNOSIS — M109 Gout, unspecified: Secondary | ICD-10-CM | POA: Diagnosis not present

## 2021-05-01 DIAGNOSIS — E538 Deficiency of other specified B group vitamins: Secondary | ICD-10-CM | POA: Diagnosis not present

## 2021-05-01 DIAGNOSIS — M4800 Spinal stenosis, site unspecified: Secondary | ICD-10-CM | POA: Diagnosis not present

## 2021-05-01 DIAGNOSIS — I10 Essential (primary) hypertension: Secondary | ICD-10-CM | POA: Diagnosis not present

## 2021-05-09 DIAGNOSIS — M0579 Rheumatoid arthritis with rheumatoid factor of multiple sites without organ or systems involvement: Secondary | ICD-10-CM | POA: Diagnosis not present

## 2021-05-09 DIAGNOSIS — M150 Primary generalized (osteo)arthritis: Secondary | ICD-10-CM | POA: Diagnosis not present

## 2021-05-09 DIAGNOSIS — M2550 Pain in unspecified joint: Secondary | ICD-10-CM | POA: Diagnosis not present

## 2021-05-09 DIAGNOSIS — Z79899 Other long term (current) drug therapy: Secondary | ICD-10-CM | POA: Diagnosis not present

## 2021-05-09 DIAGNOSIS — Z6824 Body mass index (BMI) 24.0-24.9, adult: Secondary | ICD-10-CM | POA: Diagnosis not present

## 2021-05-09 DIAGNOSIS — R5382 Chronic fatigue, unspecified: Secondary | ICD-10-CM | POA: Diagnosis not present

## 2021-05-09 DIAGNOSIS — M25579 Pain in unspecified ankle and joints of unspecified foot: Secondary | ICD-10-CM | POA: Diagnosis not present

## 2021-05-09 DIAGNOSIS — M25511 Pain in right shoulder: Secondary | ICD-10-CM | POA: Diagnosis not present

## 2021-06-10 DIAGNOSIS — I739 Peripheral vascular disease, unspecified: Secondary | ICD-10-CM | POA: Diagnosis not present

## 2021-06-10 DIAGNOSIS — B351 Tinea unguium: Secondary | ICD-10-CM | POA: Diagnosis not present

## 2021-06-10 DIAGNOSIS — M79671 Pain in right foot: Secondary | ICD-10-CM | POA: Diagnosis not present

## 2021-06-10 DIAGNOSIS — M79672 Pain in left foot: Secondary | ICD-10-CM | POA: Diagnosis not present

## 2021-06-10 DIAGNOSIS — L89892 Pressure ulcer of other site, stage 2: Secondary | ICD-10-CM | POA: Diagnosis not present

## 2021-06-13 DIAGNOSIS — Z136 Encounter for screening for cardiovascular disorders: Secondary | ICD-10-CM | POA: Diagnosis not present

## 2021-06-13 DIAGNOSIS — R7309 Other abnormal glucose: Secondary | ICD-10-CM | POA: Diagnosis not present

## 2021-06-13 DIAGNOSIS — R7303 Prediabetes: Secondary | ICD-10-CM | POA: Diagnosis not present

## 2021-06-13 DIAGNOSIS — Z0000 Encounter for general adult medical examination without abnormal findings: Secondary | ICD-10-CM | POA: Diagnosis not present

## 2021-06-19 DIAGNOSIS — K635 Polyp of colon: Secondary | ICD-10-CM | POA: Diagnosis not present

## 2021-06-19 DIAGNOSIS — K5730 Diverticulosis of large intestine without perforation or abscess without bleeding: Secondary | ICD-10-CM | POA: Diagnosis not present

## 2021-06-19 DIAGNOSIS — D120 Benign neoplasm of cecum: Secondary | ICD-10-CM | POA: Diagnosis not present

## 2021-06-19 DIAGNOSIS — Z1211 Encounter for screening for malignant neoplasm of colon: Secondary | ICD-10-CM | POA: Diagnosis not present

## 2021-06-19 DIAGNOSIS — K641 Second degree hemorrhoids: Secondary | ICD-10-CM | POA: Diagnosis not present

## 2021-06-19 DIAGNOSIS — Z86010 Personal history of colonic polyps: Secondary | ICD-10-CM | POA: Diagnosis not present

## 2021-06-19 DIAGNOSIS — D123 Benign neoplasm of transverse colon: Secondary | ICD-10-CM | POA: Diagnosis not present

## 2021-06-27 DIAGNOSIS — M25562 Pain in left knee: Secondary | ICD-10-CM | POA: Diagnosis not present

## 2021-06-27 DIAGNOSIS — M1712 Unilateral primary osteoarthritis, left knee: Secondary | ICD-10-CM | POA: Diagnosis not present

## 2021-06-27 DIAGNOSIS — M25462 Effusion, left knee: Secondary | ICD-10-CM | POA: Diagnosis not present

## 2021-07-04 DIAGNOSIS — M1712 Unilateral primary osteoarthritis, left knee: Secondary | ICD-10-CM | POA: Diagnosis not present

## 2021-07-04 DIAGNOSIS — M25562 Pain in left knee: Secondary | ICD-10-CM | POA: Diagnosis not present

## 2021-07-11 DIAGNOSIS — M25562 Pain in left knee: Secondary | ICD-10-CM | POA: Diagnosis not present

## 2021-07-11 DIAGNOSIS — M1712 Unilateral primary osteoarthritis, left knee: Secondary | ICD-10-CM | POA: Diagnosis not present

## 2021-07-24 DIAGNOSIS — M2041 Other hammer toe(s) (acquired), right foot: Secondary | ICD-10-CM | POA: Diagnosis not present

## 2021-07-24 DIAGNOSIS — M2042 Other hammer toe(s) (acquired), left foot: Secondary | ICD-10-CM | POA: Diagnosis not present

## 2021-07-25 DIAGNOSIS — M48062 Spinal stenosis, lumbar region with neurogenic claudication: Secondary | ICD-10-CM | POA: Diagnosis not present

## 2021-07-25 DIAGNOSIS — Z23 Encounter for immunization: Secondary | ICD-10-CM | POA: Diagnosis not present

## 2021-07-25 DIAGNOSIS — M05841 Other rheumatoid arthritis with rheumatoid factor of right hand: Secondary | ICD-10-CM | POA: Diagnosis not present

## 2021-07-25 DIAGNOSIS — M170 Bilateral primary osteoarthritis of knee: Secondary | ICD-10-CM | POA: Diagnosis not present

## 2021-07-31 DIAGNOSIS — G8929 Other chronic pain: Secondary | ICD-10-CM | POA: Diagnosis not present

## 2021-07-31 DIAGNOSIS — M48061 Spinal stenosis, lumbar region without neurogenic claudication: Secondary | ICD-10-CM | POA: Diagnosis not present

## 2021-07-31 DIAGNOSIS — M5418 Radiculopathy, sacral and sacrococcygeal region: Secondary | ICD-10-CM | POA: Diagnosis not present

## 2021-07-31 DIAGNOSIS — M5441 Lumbago with sciatica, right side: Secondary | ICD-10-CM | POA: Diagnosis not present

## 2021-08-05 DIAGNOSIS — H524 Presbyopia: Secondary | ICD-10-CM | POA: Diagnosis not present

## 2021-08-05 DIAGNOSIS — H401112 Primary open-angle glaucoma, right eye, moderate stage: Secondary | ICD-10-CM | POA: Diagnosis not present

## 2021-08-05 DIAGNOSIS — H401123 Primary open-angle glaucoma, left eye, severe stage: Secondary | ICD-10-CM | POA: Diagnosis not present

## 2021-08-08 DIAGNOSIS — M2042 Other hammer toe(s) (acquired), left foot: Secondary | ICD-10-CM | POA: Diagnosis not present

## 2021-08-08 DIAGNOSIS — M2041 Other hammer toe(s) (acquired), right foot: Secondary | ICD-10-CM | POA: Diagnosis not present

## 2021-08-12 DIAGNOSIS — Z6822 Body mass index (BMI) 22.0-22.9, adult: Secondary | ICD-10-CM | POA: Diagnosis not present

## 2021-08-12 DIAGNOSIS — R5382 Chronic fatigue, unspecified: Secondary | ICD-10-CM | POA: Diagnosis not present

## 2021-08-12 DIAGNOSIS — M2550 Pain in unspecified joint: Secondary | ICD-10-CM | POA: Diagnosis not present

## 2021-08-12 DIAGNOSIS — M150 Primary generalized (osteo)arthritis: Secondary | ICD-10-CM | POA: Diagnosis not present

## 2021-08-12 DIAGNOSIS — M25579 Pain in unspecified ankle and joints of unspecified foot: Secondary | ICD-10-CM | POA: Diagnosis not present

## 2021-08-12 DIAGNOSIS — Z79899 Other long term (current) drug therapy: Secondary | ICD-10-CM | POA: Diagnosis not present

## 2021-08-12 DIAGNOSIS — M0579 Rheumatoid arthritis with rheumatoid factor of multiple sites without organ or systems involvement: Secondary | ICD-10-CM | POA: Diagnosis not present

## 2021-08-13 DIAGNOSIS — M2042 Other hammer toe(s) (acquired), left foot: Secondary | ICD-10-CM | POA: Diagnosis not present

## 2021-08-13 DIAGNOSIS — M2041 Other hammer toe(s) (acquired), right foot: Secondary | ICD-10-CM | POA: Diagnosis not present

## 2021-08-27 DIAGNOSIS — E7800 Pure hypercholesterolemia, unspecified: Secondary | ICD-10-CM | POA: Diagnosis not present

## 2021-08-27 DIAGNOSIS — I10 Essential (primary) hypertension: Secondary | ICD-10-CM | POA: Diagnosis not present

## 2021-08-27 DIAGNOSIS — D500 Iron deficiency anemia secondary to blood loss (chronic): Secondary | ICD-10-CM | POA: Diagnosis not present

## 2021-08-27 DIAGNOSIS — M1990 Unspecified osteoarthritis, unspecified site: Secondary | ICD-10-CM | POA: Diagnosis not present

## 2021-08-27 DIAGNOSIS — K219 Gastro-esophageal reflux disease without esophagitis: Secondary | ICD-10-CM | POA: Diagnosis not present

## 2021-08-27 DIAGNOSIS — M05842 Other rheumatoid arthritis with rheumatoid factor of left hand: Secondary | ICD-10-CM | POA: Diagnosis not present

## 2021-08-27 DIAGNOSIS — M170 Bilateral primary osteoarthritis of knee: Secondary | ICD-10-CM | POA: Diagnosis not present

## 2021-08-27 DIAGNOSIS — M05841 Other rheumatoid arthritis with rheumatoid factor of right hand: Secondary | ICD-10-CM | POA: Diagnosis not present

## 2021-08-27 DIAGNOSIS — M069 Rheumatoid arthritis, unspecified: Secondary | ICD-10-CM | POA: Diagnosis not present

## 2021-09-05 DIAGNOSIS — M2041 Other hammer toe(s) (acquired), right foot: Secondary | ICD-10-CM | POA: Diagnosis not present

## 2021-09-05 DIAGNOSIS — I739 Peripheral vascular disease, unspecified: Secondary | ICD-10-CM | POA: Diagnosis not present

## 2021-09-05 DIAGNOSIS — M2042 Other hammer toe(s) (acquired), left foot: Secondary | ICD-10-CM | POA: Diagnosis not present

## 2021-09-23 DIAGNOSIS — H401112 Primary open-angle glaucoma, right eye, moderate stage: Secondary | ICD-10-CM | POA: Diagnosis not present

## 2021-09-23 DIAGNOSIS — H401123 Primary open-angle glaucoma, left eye, severe stage: Secondary | ICD-10-CM | POA: Diagnosis not present

## 2021-09-25 DIAGNOSIS — M2041 Other hammer toe(s) (acquired), right foot: Secondary | ICD-10-CM | POA: Diagnosis not present

## 2021-09-25 DIAGNOSIS — M2042 Other hammer toe(s) (acquired), left foot: Secondary | ICD-10-CM | POA: Diagnosis not present

## 2021-10-02 DIAGNOSIS — M25562 Pain in left knee: Secondary | ICD-10-CM | POA: Diagnosis not present

## 2021-10-02 DIAGNOSIS — M25462 Effusion, left knee: Secondary | ICD-10-CM | POA: Diagnosis not present

## 2021-10-02 DIAGNOSIS — M1712 Unilateral primary osteoarthritis, left knee: Secondary | ICD-10-CM | POA: Diagnosis not present

## 2021-11-12 DIAGNOSIS — M0579 Rheumatoid arthritis with rheumatoid factor of multiple sites without organ or systems involvement: Secondary | ICD-10-CM | POA: Diagnosis not present

## 2021-11-12 DIAGNOSIS — M2550 Pain in unspecified joint: Secondary | ICD-10-CM | POA: Diagnosis not present

## 2021-11-12 DIAGNOSIS — Z6823 Body mass index (BMI) 23.0-23.9, adult: Secondary | ICD-10-CM | POA: Diagnosis not present

## 2021-11-12 DIAGNOSIS — R5382 Chronic fatigue, unspecified: Secondary | ICD-10-CM | POA: Diagnosis not present

## 2021-11-12 DIAGNOSIS — M150 Primary generalized (osteo)arthritis: Secondary | ICD-10-CM | POA: Diagnosis not present

## 2021-11-12 DIAGNOSIS — Z79899 Other long term (current) drug therapy: Secondary | ICD-10-CM | POA: Diagnosis not present

## 2021-11-21 DIAGNOSIS — L98421 Non-pressure chronic ulcer of back limited to breakdown of skin: Secondary | ICD-10-CM | POA: Diagnosis not present

## 2021-12-04 DIAGNOSIS — L84 Corns and callosities: Secondary | ICD-10-CM | POA: Diagnosis not present

## 2021-12-04 DIAGNOSIS — I739 Peripheral vascular disease, unspecified: Secondary | ICD-10-CM | POA: Diagnosis not present

## 2021-12-06 DIAGNOSIS — M170 Bilateral primary osteoarthritis of knee: Secondary | ICD-10-CM | POA: Diagnosis not present

## 2021-12-06 DIAGNOSIS — M48062 Spinal stenosis, lumbar region with neurogenic claudication: Secondary | ICD-10-CM | POA: Diagnosis not present

## 2021-12-25 DIAGNOSIS — M069 Rheumatoid arthritis, unspecified: Secondary | ICD-10-CM | POA: Diagnosis not present

## 2021-12-25 DIAGNOSIS — M170 Bilateral primary osteoarthritis of knee: Secondary | ICD-10-CM | POA: Diagnosis not present

## 2021-12-25 DIAGNOSIS — M48062 Spinal stenosis, lumbar region with neurogenic claudication: Secondary | ICD-10-CM | POA: Diagnosis not present

## 2021-12-25 DIAGNOSIS — R7303 Prediabetes: Secondary | ICD-10-CM | POA: Diagnosis not present

## 2021-12-25 DIAGNOSIS — L98421 Non-pressure chronic ulcer of back limited to breakdown of skin: Secondary | ICD-10-CM | POA: Diagnosis not present

## 2021-12-25 DIAGNOSIS — I10 Essential (primary) hypertension: Secondary | ICD-10-CM | POA: Diagnosis not present

## 2022-01-03 DIAGNOSIS — M170 Bilateral primary osteoarthritis of knee: Secondary | ICD-10-CM | POA: Diagnosis not present

## 2022-01-03 DIAGNOSIS — M48062 Spinal stenosis, lumbar region with neurogenic claudication: Secondary | ICD-10-CM | POA: Diagnosis not present

## 2022-01-06 DIAGNOSIS — B351 Tinea unguium: Secondary | ICD-10-CM | POA: Diagnosis not present

## 2022-01-06 DIAGNOSIS — I739 Peripheral vascular disease, unspecified: Secondary | ICD-10-CM | POA: Diagnosis not present

## 2022-01-06 DIAGNOSIS — M79671 Pain in right foot: Secondary | ICD-10-CM | POA: Diagnosis not present

## 2022-01-06 DIAGNOSIS — M79672 Pain in left foot: Secondary | ICD-10-CM | POA: Diagnosis not present

## 2022-01-06 DIAGNOSIS — L84 Corns and callosities: Secondary | ICD-10-CM | POA: Diagnosis not present

## 2022-01-23 DIAGNOSIS — M25562 Pain in left knee: Secondary | ICD-10-CM | POA: Diagnosis not present

## 2022-01-23 DIAGNOSIS — M1712 Unilateral primary osteoarthritis, left knee: Secondary | ICD-10-CM | POA: Diagnosis not present

## 2022-01-23 DIAGNOSIS — M25462 Effusion, left knee: Secondary | ICD-10-CM | POA: Diagnosis not present

## 2022-01-24 DIAGNOSIS — Z79899 Other long term (current) drug therapy: Secondary | ICD-10-CM | POA: Diagnosis not present

## 2022-01-24 DIAGNOSIS — Z6823 Body mass index (BMI) 23.0-23.9, adult: Secondary | ICD-10-CM | POA: Diagnosis not present

## 2022-01-24 DIAGNOSIS — R5382 Chronic fatigue, unspecified: Secondary | ICD-10-CM | POA: Diagnosis not present

## 2022-01-24 DIAGNOSIS — M2550 Pain in unspecified joint: Secondary | ICD-10-CM | POA: Diagnosis not present

## 2022-01-24 DIAGNOSIS — M0579 Rheumatoid arthritis with rheumatoid factor of multiple sites without organ or systems involvement: Secondary | ICD-10-CM | POA: Diagnosis not present

## 2022-01-24 DIAGNOSIS — M150 Primary generalized (osteo)arthritis: Secondary | ICD-10-CM | POA: Diagnosis not present

## 2022-01-28 DIAGNOSIS — R29898 Other symptoms and signs involving the musculoskeletal system: Secondary | ICD-10-CM | POA: Diagnosis not present

## 2022-01-28 DIAGNOSIS — R2689 Other abnormalities of gait and mobility: Secondary | ICD-10-CM | POA: Diagnosis not present

## 2022-01-28 DIAGNOSIS — Z79899 Other long term (current) drug therapy: Secondary | ICD-10-CM | POA: Diagnosis not present

## 2022-01-28 DIAGNOSIS — M545 Low back pain, unspecified: Secondary | ICD-10-CM | POA: Diagnosis not present

## 2022-02-03 DIAGNOSIS — M170 Bilateral primary osteoarthritis of knee: Secondary | ICD-10-CM | POA: Diagnosis not present

## 2022-02-03 DIAGNOSIS — M48062 Spinal stenosis, lumbar region with neurogenic claudication: Secondary | ICD-10-CM | POA: Diagnosis not present

## 2022-02-12 DIAGNOSIS — M1712 Unilateral primary osteoarthritis, left knee: Secondary | ICD-10-CM | POA: Diagnosis not present

## 2022-02-12 DIAGNOSIS — M25562 Pain in left knee: Secondary | ICD-10-CM | POA: Diagnosis not present

## 2022-02-14 ENCOUNTER — Emergency Department (HOSPITAL_COMMUNITY): Payer: Medicare Other

## 2022-02-14 ENCOUNTER — Other Ambulatory Visit: Payer: Self-pay

## 2022-02-14 ENCOUNTER — Observation Stay (HOSPITAL_COMMUNITY)
Admission: EM | Admit: 2022-02-14 | Discharge: 2022-02-16 | Disposition: A | Discharge status: Home Health | Payer: Medicare Other | Attending: Internal Medicine | Admitting: Internal Medicine

## 2022-02-14 ENCOUNTER — Encounter (HOSPITAL_COMMUNITY): Payer: Self-pay

## 2022-02-14 DIAGNOSIS — I10 Essential (primary) hypertension: Secondary | ICD-10-CM

## 2022-02-14 DIAGNOSIS — E876 Hypokalemia: Secondary | ICD-10-CM

## 2022-02-14 DIAGNOSIS — I110 Hypertensive heart disease with heart failure: Secondary | ICD-10-CM | POA: Diagnosis not present

## 2022-02-14 DIAGNOSIS — S7002XA Contusion of left hip, initial encounter: Secondary | ICD-10-CM | POA: Diagnosis not present

## 2022-02-14 DIAGNOSIS — W182XXA Fall in (into) shower or empty bathtub, initial encounter: Secondary | ICD-10-CM | POA: Diagnosis not present

## 2022-02-14 DIAGNOSIS — I5042 Chronic combined systolic (congestive) and diastolic (congestive) heart failure: Secondary | ICD-10-CM | POA: Insufficient documentation

## 2022-02-14 DIAGNOSIS — I639 Cerebral infarction, unspecified: Secondary | ICD-10-CM | POA: Diagnosis not present

## 2022-02-14 DIAGNOSIS — M7989 Other specified soft tissue disorders: Secondary | ICD-10-CM | POA: Diagnosis not present

## 2022-02-14 DIAGNOSIS — S0990XA Unspecified injury of head, initial encounter: Secondary | ICD-10-CM | POA: Insufficient documentation

## 2022-02-14 DIAGNOSIS — Z7902 Long term (current) use of antithrombotics/antiplatelets: Secondary | ICD-10-CM | POA: Insufficient documentation

## 2022-02-14 DIAGNOSIS — M069 Rheumatoid arthritis, unspecified: Secondary | ICD-10-CM | POA: Diagnosis present

## 2022-02-14 DIAGNOSIS — D6869 Other thrombophilia: Secondary | ICD-10-CM | POA: Diagnosis not present

## 2022-02-14 DIAGNOSIS — Z9889 Other specified postprocedural states: Secondary | ICD-10-CM | POA: Diagnosis not present

## 2022-02-14 DIAGNOSIS — Z87891 Personal history of nicotine dependence: Secondary | ICD-10-CM | POA: Diagnosis not present

## 2022-02-14 DIAGNOSIS — G8929 Other chronic pain: Secondary | ICD-10-CM | POA: Diagnosis present

## 2022-02-14 DIAGNOSIS — W19XXXA Unspecified fall, initial encounter: Secondary | ICD-10-CM

## 2022-02-14 DIAGNOSIS — Z79899 Other long term (current) drug therapy: Secondary | ICD-10-CM | POA: Diagnosis not present

## 2022-02-14 DIAGNOSIS — S8012XA Contusion of left lower leg, initial encounter: Secondary | ICD-10-CM | POA: Diagnosis not present

## 2022-02-14 DIAGNOSIS — Z96651 Presence of right artificial knee joint: Secondary | ICD-10-CM | POA: Diagnosis not present

## 2022-02-14 DIAGNOSIS — I5043 Acute on chronic combined systolic (congestive) and diastolic (congestive) heart failure: Secondary | ICD-10-CM

## 2022-02-14 DIAGNOSIS — Y92002 Bathroom of unspecified non-institutional (private) residence single-family (private) house as the place of occurrence of the external cause: Secondary | ICD-10-CM | POA: Insufficient documentation

## 2022-02-14 DIAGNOSIS — T148XXA Other injury of unspecified body region, initial encounter: Secondary | ICD-10-CM | POA: Diagnosis present

## 2022-02-14 DIAGNOSIS — Y93E1 Activity, personal bathing and showering: Secondary | ICD-10-CM | POA: Diagnosis not present

## 2022-02-14 DIAGNOSIS — Z96641 Presence of right artificial hip joint: Secondary | ICD-10-CM | POA: Diagnosis not present

## 2022-02-14 DIAGNOSIS — I739 Peripheral vascular disease, unspecified: Secondary | ICD-10-CM

## 2022-02-14 DIAGNOSIS — S79912A Unspecified injury of left hip, initial encounter: Secondary | ICD-10-CM | POA: Diagnosis present

## 2022-02-14 DIAGNOSIS — S0993XA Unspecified injury of face, initial encounter: Secondary | ICD-10-CM | POA: Diagnosis not present

## 2022-02-14 LAB — CBC
HCT: 39 % (ref 39.0–52.0)
Hemoglobin: 11.7 g/dL — ABNORMAL LOW (ref 13.0–17.0)
MCH: 33.8 pg (ref 26.0–34.0)
MCHC: 30 g/dL (ref 30.0–36.0)
MCV: 112.7 fL — ABNORMAL HIGH (ref 80.0–100.0)
Platelets: 160 10*3/uL (ref 150–400)
RBC: 3.46 MIL/uL — ABNORMAL LOW (ref 4.22–5.81)
RDW: 16.3 % — ABNORMAL HIGH (ref 11.5–15.5)
WBC: 6 10*3/uL (ref 4.0–10.5)
nRBC: 0 % (ref 0.0–0.2)

## 2022-02-14 LAB — BASIC METABOLIC PANEL
Anion gap: 8 (ref 5–15)
BUN: 8 mg/dL (ref 8–23)
CO2: 25 mmol/L (ref 22–32)
Calcium: 8.8 mg/dL — ABNORMAL LOW (ref 8.9–10.3)
Chloride: 111 mmol/L (ref 98–111)
Creatinine, Ser: 0.97 mg/dL (ref 0.61–1.24)
GFR, Estimated: >60 mL/min (ref >60)
Glucose, Bld: 92 mg/dL (ref 70–99)
Potassium: 3.3 mmol/L — ABNORMAL LOW (ref 3.5–5.1)
Sodium: 144 mmol/L (ref 135–145)

## 2022-02-14 NOTE — ED Provider Triage Note (Signed)
Emergency Medicine Provider Triage Evaluation Note ? ?Nathaniel [name] , a 87 y.o. male  was evaluated in triage.  Pt complains of left hip pain since fall in the shower last night.  ? ?Review of Systems  ?Positive: Left hip and leg pain ?Negative: numbness ? ?Physical Exam  ?There were no vitals taken for this visit. ?Gen:   Awake, no distress   ?Resp:  Normal effort  ?MSK:   Moves extremities without difficulty  ?Other:  Extensive bruising of the left leg visualized on family member's photos ? ?Medical Decision Making  ?Medically screening exam initiated at 7:19 PM.  Appropriate orders placed.  Nathaniel [name] was informed that the remainder of the evaluation will be completed by another provider, this initial triage assessment does not replace that evaluation, and the importance of remaining in the ED until their evaluation is complete. ? ?Due to high likelihood of hip fracture, will go ahead and obtain CT hip to r/o acute fracture ?  ?Kateri Plummer, PA-C ?02/14/22 1946 ? ?

## 2022-02-14 NOTE — ED Triage Notes (Signed)
Pt arrives to ED POV c/o Fall. Pt states falling in the shower last night. Pt has bruising on buttock and legs. Denies LOC or hitting head. Pt is on blood thinners Xarelto.  ?

## 2022-02-14 NOTE — ED Provider Notes (Signed)
?[facility] ?Provider Note ? ? ?CSN: [id number] ?Arrival date & time: 02/14/22  1751 ? ?  ? ?History ? ?Chief Complaint  ?Patient presents with  ? Fall  ? ? ?Nathaniel [name] is a 87 y.o. male. ? ? ?Fall ? ?Patient is 87 y.o. male with medical history significant for rheumatoid arthritis, chronic pain, hypertension, and glaucoma with left eye blindness, now presenting to emergency department for evaluation left lower extremity hematoma and bruising after mechanical fall.  Patient reports he took shower yesterday and his leg gave out.  He fell backwards hitting his back of the head and landing on his buttocks.  He reports he did have a fall last week and did have left lower extremity hematoma and swelling.  However, the swelling has progressively worsened since the fall yesterday.  They are concerned as patient is on Plavix. In addition, he has been having worsening pain and having difficulty ambulating.  He normally uses a walker however is getting progressively worse to carry out his ADLs.  He denies associated chest pain or shortness of breath.  He denies syncope icing.  He does not feel lightheaded or dizzy.  Denies associated fever, cough or congestion.  Patient and his wife are concerned of his hematoma and progressively worsening weakness.  He reports being compliant with his medication for his chronic condition. ? ?Home Medications ?Prior to Admission medications   ?Medication Sig Start Date End Date Taking? Authorizing Provider  ?ACTEMRA ACTPEN 162 MG/0.9ML SOAJ Inject 162 mg into the skin every Thursday. 11/12/21  Yes [provider]  ?baclofen (LIORESAL) 10 MG tablet Take 1 tablet (10 mg total) by mouth 2 (two) times daily. 04/18/18  Yes Alma Friendly, MD  ?brimonidine (ALPHAGAN) 0.2 % ophthalmic solution Place 1 drop into both eyes in the morning and at bedtime. 11/04/21  Yes [provider]  ?cetirizine (ZYRTEC) 10 MG tablet Take 10 mg by mouth  daily.   Yes [provider]  ?clopidogrel (PLAVIX) 75 MG tablet Take 1 tablet (75 mg total) by mouth daily. 04/27/19  Yes Sheikh, Omair Latif, DO  ?diclofenac sodium (VOLTAREN) 1 % GEL Apply 1 application. topically in the morning and at bedtime. Left knee and both feet 04/15/18  Yes [provider]  ?dorzolamide (TRUSOPT) 2 % ophthalmic solution Place 1 drop into both eyes 2 (two) times daily. 01/03/22  Yes [provider]  ?FIBER ADULT GUMMIES PO Take 1 tablet by mouth daily.   Yes [provider]  ?folic acid (FOLVITE) 1 MG tablet Take 1 mg by mouth at bedtime.    Yes [provider]  ?gabapentin (NEURONTIN) 800 MG tablet Take 800 mg by mouth 4 (four) times daily.   Yes [provider]  ?HYDROcodone-acetaminophen (NORCO/VICODIN) 5-325 MG tablet Take 1 tablet by mouth every 8 (eight) hours as needed for moderate pain. 03/09/15  Yes [provider]  ?methotrexate 50 MG/2ML injection Inject 25 mg into the skin every Wednesday.   Yes [provider]  ?Omega-3 Fatty Acids (FISH OIL) 1000 MG CPDR Take 1,000 mg by mouth daily.   Yes [provider]  ?sulfaSALAzine (AZULFIDINE) 500 MG tablet Take 1,000 mg by mouth 2 (two) times daily. 01/03/22  Yes [provider]  ?traZODone (DESYREL) 150 MG tablet Take 150 mg by mouth at bedtime. 11/28/21  Yes [provider]  ?TURMERIC PO Take 1 capsule by mouth daily.   Yes [provider]  ?vitamin B-12 (CYANOCOBALAMIN)  1000 MCG tablet Take 1,000 mcg by mouth daily.   Yes [provider]  ?vitamin C (ASCORBIC ACID) 500 MG tablet Take 500 mg by mouth daily.   Yes [provider]  ?ondansetron (ZOFRAN) 4 MG tablet Take 1 tablet (4 mg total) by mouth every 6 (six) hours as needed for nausea. ?Patient not taking: Reported on 02/14/2022 04/24/19   Raiford Noble Latif, DO  ?pantoprazole (PROTONIX) 40 MG tablet Take 1 tablet (40 mg total) by mouth daily. ?Patient not taking:  Reported on 02/14/2022 04/25/19   Kerney Elbe, DO  ?   ? ?Allergies    ?Ibuprofen and Tylenol [acetaminophen]   ? ?Physical Exam ?Updated Vital Signs ?BP (!) 185/74   Pulse (!) 57   Temp 98 ?F (36.7 ?C)   Resp 15   SpO2 99%  ?Physical Exam ?Constitutional:   ?   General: He is not in acute distress. ?   Comments: Chronically ill-appearing  ?HENT:  ?   Head: Normocephalic and atraumatic.  ?   Nose: No congestion.  ?   Mouth/Throat:  ?   Mouth: Mucous membranes are moist.  ?   Pharynx: Oropharynx is clear.  ?Eyes:  ?   Pupils: Pupils are equal, round, and reactive to light.  ?Cardiovascular:  ?   Rate and Rhythm: Normal rate.  ?Pulmonary:  ?   Effort: No respiratory distress.  ?   Breath sounds: No wheezing.  ?Abdominal:  ?   General: Abdomen is flat.  ?   Tenderness: There is no abdominal tenderness. There is no guarding or rebound.  ?Musculoskeletal:     ?   General: Swelling and tenderness present.  ?   Cervical back: No rigidity or tenderness.  ?   Comments: Left lateral hip hematoma and tenderness.  There is diffuse swelling from the upper hip on the left that extends to the mid thigh.  Patient is neurovascular intact distally and bilaterally.  ?Skin: ?   Findings: Bruising present.  ?Neurological:  ?   General: No focal deficit present.  ?   Mental Status: He is alert and oriented to person, place, and time. Mental status is at baseline.  ? ? ?ED Results / Procedures / Treatments   ?Labs ?(all labs ordered are listed, but only abnormal results are displayed) ?Labs Reviewed  ?CBC - Abnormal; Notable for the following components:  ?    Result Value  ? RBC 3.46 (*)   ? Hemoglobin 11.7 (*)   ? MCV 112.7 (*)   ? RDW 16.3 (*)   ? All other components within normal limits  ?BASIC METABOLIC PANEL - Abnormal; Notable for the following components:  ? Potassium 3.3 (*)   ? Calcium 8.8 (*)   ? All other components within normal limits  ? ? ?EKG ?None ? ?Radiology ?CT Head Wo Contrast ? ?Result Date:  02/14/2022 ?CLINICAL DATA:  [place], facial and head trauma EXAM: CT HEAD WITHOUT CONTRAST TECHNIQUE: Contiguous axial images were obtained from the base of the skull through the vertex without intravenous contrast. RADIATION DOSE REDUCTION: This exam was performed according to the departmental dose-optimization program which includes automated exposure control, adjustment of the mA and/or kV according to patient size and/or use of iterative reconstruction technique. COMPARISON:  04/17/2018 FINDINGS: Brain: No acute infarct or hemorrhage. Chronic infarct left cerebellar hemisphere again noted. Lateral ventricles and midline structures are unremarkable. No acute extra-axial fluid collections. No mass effect. Vascular: No hyperdense vessel or unexpected calcification. Skull:  Normal. Negative for fracture or focal lesion. Sinuses/Orbits: No acute finding. Stable postsurgical changes left globe. Other: None. IMPRESSION: 1. Stable head CT, no acute intracranial process. Electronically Signed   By: Randa Ngo M.D.   On: 02/14/2022 23:30  ? ?DG Hip Unilat W or Wo Pelvis 2-3 Views Left ? ?Result Date: 02/14/2022 ?CLINICAL DATA:  [place] EXAM: DG HIP (WITH OR WITHOUT PELVIS) 2-3V LEFT COMPARISON:  None. FINDINGS: Frontal view of the pelvis as well as frontal and frogleg lateral views of the left hip are obtained. Right hip arthroplasty is partially visualized, without signs of acute complication. There is moderate left hip osteoarthritis. No fracture, subluxation, or dislocation. Marked soft tissue swelling overlying the lateral aspect the left hip consistent with contusion. Remainder of the visualized bony pelvis is unremarkable. Multilevel lumbar spondylosis and facet hypertrophy. IMPRESSION: 1. Marked soft tissue swelling overlying the lateral aspect of the left hip consistent with soft tissue contusion. 2. No acute displaced fracture. 3. Moderate left hip osteoarthritis. Electronically Signed   By: Randa Ngo M.D.   On:  02/14/2022 20:29   ? ?Procedures ?Procedures  ? ?Medications Ordered in ED ?Medications  ?potassium chloride 10 mEq in 100 mL IVPB (has no administration in time range)  ? ? ?ED Course/ Medical Decision Making/ A&P ?  ?

## 2022-02-15 ENCOUNTER — Encounter (HOSPITAL_COMMUNITY): Payer: Self-pay | Admitting: Internal Medicine

## 2022-02-15 ENCOUNTER — Emergency Department (HOSPITAL_COMMUNITY): Payer: Medicare Other

## 2022-02-15 DIAGNOSIS — I739 Peripheral vascular disease, unspecified: Secondary | ICD-10-CM

## 2022-02-15 DIAGNOSIS — T148XXA Other injury of unspecified body region, initial encounter: Secondary | ICD-10-CM | POA: Diagnosis present

## 2022-02-15 DIAGNOSIS — I10 Essential (primary) hypertension: Secondary | ICD-10-CM

## 2022-02-15 DIAGNOSIS — E876 Hypokalemia: Secondary | ICD-10-CM

## 2022-02-15 DIAGNOSIS — S7002XA Contusion of left hip, initial encounter: Secondary | ICD-10-CM | POA: Diagnosis not present

## 2022-02-15 LAB — HEMOGLOBIN AND HEMATOCRIT, BLOOD
HCT: 35 % — ABNORMAL LOW (ref 39.0–52.0)
Hemoglobin: 10.8 g/dL — ABNORMAL LOW (ref 13.0–17.0)

## 2022-02-15 LAB — MAGNESIUM: Magnesium: 1.9 mg/dL (ref 1.7–2.4)

## 2022-02-15 MED ORDER — DORZOLAMIDE HCL 2 % OP SOLN
1.0000 [drp] | Freq: Two times a day (BID) | OPHTHALMIC | Status: DC
  Administered 2022-02-16: 1 [drp] via OPHTHALMIC
  Filled 2022-02-15 (×2): qty 10

## 2022-02-15 MED ORDER — TRAZODONE HCL 50 MG PO TABS
150.0000 mg | ORAL_TABLET | Freq: Every day | ORAL | Status: DC
  Administered 2022-02-15: 150 mg via ORAL
  Filled 2022-02-15: qty 3

## 2022-02-15 MED ORDER — POTASSIUM CHLORIDE CRYS ER 20 MEQ PO TBCR
20.0000 meq | EXTENDED_RELEASE_TABLET | Freq: Once | ORAL | Status: AC
  Administered 2022-02-15: 20 meq via ORAL
  Filled 2022-02-15: qty 1

## 2022-02-15 MED ORDER — HYDROCODONE-ACETAMINOPHEN 5-325 MG PO TABS
1.0000 | ORAL_TABLET | Freq: Three times a day (TID) | ORAL | Status: DC | PRN
  Administered 2022-02-15: 1 via ORAL
  Filled 2022-02-15: qty 1

## 2022-02-15 MED ORDER — BRIMONIDINE TARTRATE 0.2 % OP SOLN
1.0000 [drp] | Freq: Two times a day (BID) | OPHTHALMIC | Status: DC
  Administered 2022-02-16: 1 [drp] via OPHTHALMIC
  Filled 2022-02-15 (×2): qty 5

## 2022-02-15 MED ORDER — IOHEXOL 300 MG/ML  SOLN
100.0000 mL | Freq: Once | INTRAMUSCULAR | Status: AC | PRN
  Administered 2022-02-15: 100 mL via INTRAVENOUS

## 2022-02-15 MED ORDER — POTASSIUM CHLORIDE 10 MEQ/100ML IV SOLN
10.0000 meq | Freq: Once | INTRAVENOUS | Status: AC
  Administered 2022-02-15: 10 meq via INTRAVENOUS
  Filled 2022-02-15: qty 100

## 2022-02-15 MED ORDER — SULFASALAZINE 500 MG PO TABS
1000.0000 mg | ORAL_TABLET | Freq: Two times a day (BID) | ORAL | Status: DC
  Administered 2022-02-15 – 2022-02-16 (×3): 1000 mg via ORAL
  Filled 2022-02-15 (×5): qty 2

## 2022-02-15 MED ORDER — HYDRALAZINE HCL 20 MG/ML IJ SOLN
5.0000 mg | INTRAMUSCULAR | Status: DC | PRN
  Administered 2022-02-15: 5 mg via INTRAVENOUS
  Filled 2022-02-15: qty 1

## 2022-02-15 MED ORDER — GABAPENTIN 400 MG PO CAPS
800.0000 mg | ORAL_CAPSULE | Freq: Four times a day (QID) | ORAL | Status: DC
  Administered 2022-02-15 – 2022-02-16 (×5): 800 mg via ORAL
  Filled 2022-02-15 (×7): qty 2

## 2022-02-15 MED ORDER — POTASSIUM CHLORIDE CRYS ER 20 MEQ PO TBCR
20.0000 meq | EXTENDED_RELEASE_TABLET | Freq: Once | ORAL | Status: AC
Start: 2022-02-15 — End: 2022-02-15
  Administered 2022-02-15: 20 meq via ORAL
  Filled 2022-02-15: qty 1

## 2022-02-15 MED ORDER — AMLODIPINE BESYLATE 10 MG PO TABS
10.0000 mg | ORAL_TABLET | Freq: Every day | ORAL | Status: DC
Start: 2022-02-15 — End: 2022-02-16
  Administered 2022-02-15 – 2022-02-16 (×2): 10 mg via ORAL
  Filled 2022-02-15: qty 1
  Filled 2022-02-15: qty 2

## 2022-02-15 MED ORDER — BACLOFEN 10 MG PO TABS
10.0000 mg | ORAL_TABLET | Freq: Two times a day (BID) | ORAL | Status: DC
  Administered 2022-02-15 – 2022-02-16 (×3): 10 mg via ORAL
  Filled 2022-02-15 (×4): qty 1

## 2022-02-15 NOTE — Assessment & Plan Note (Addendum)
Mild. ?-Replace potassium ?-Check magnesium level and replace if low ?

## 2022-02-15 NOTE — Assessment & Plan Note (Addendum)
Left hip hematomas ?In the setting of falling on this hip multiple times recently.  He is on Plavix.  CT showing multiple hematomas over the anterolateral aspect of the hip with no contrast extravasation to suggest active hemorrhage.  His hemoglobin is stable. ?-Hold Plavix ?-Continue home Norco, baclofen, and gabapentin for pain. ?-PT/OT eval, fall precautions ?-Repeat H&H ?

## 2022-02-15 NOTE — Assessment & Plan Note (Signed)
-  On weekly Actemra and methotrexate ?-Continue sulfasalazine ?-Outpatient rheumatology follow-up ?

## 2022-02-15 NOTE — Assessment & Plan Note (Signed)
-   Continue home medications 

## 2022-02-15 NOTE — H&P (Addendum)
?History and Physical  ? ? ?[name] LOV:[id number] DOB: 12/21/1934 DOA: 02/14/2022 ? ?PCP: Shirline Frees, MD ? ?Patient coming from: Home ? ?Chief Complaint: Left hip pain ? ?HPI: Nathaniel [name] is a 87 y.o. male with medical history significant of hypertension, GERD, rheumatoid arthritis, chronic pain, glaucoma with left eye blindness, chronic combined CHF, history of GI bleed presented to the ED with left lower extremity hematoma and bruising after a mechanical fall.  He is on Plavix 75 mg daily.  Hemoglobin stable compared to prior labs.  Potassium 3.3.  CT head negative for acute finding.  CT of left hip negative for fracture or dislocation.  Showing multiple hematomas over the anterolateral aspect of the hip with no contrast extravasation to suggest active hemorrhage.  Arterial vascular calcifications in the pelvis and left lower extremity without evidence of significant stenosis.  Patient was given IV potassium 10 mEq. ? ?Patient states he had 3 recent falls and each time he fell on his left hip.  Most recent fall was 2 nights ago when he fell in his shower and injured the same hip.  He thinks he might have brushed his head against the shower when he fell but did not lose consciousness.  Denies preceding lightheadedness/dizziness, chest pain, palpitations, or shortness of breath.  He is on Plavix.  States he was admitted to the hospital several years ago after he passed out and doctors did several tests and told him they were not sure if he was having a stroke but started him on Plavix.  No other complaints.  Denies fevers, cough, nausea, vomiting, abdominal pain, diarrhea, or dysuria. ? ?Review of Systems:  ?Review of Systems  ?All other systems reviewed and are negative. ? ?Past Medical History:  ?Diagnosis Date  ? AKI (acute kidney injury) ([place]) 04/17/2018  ? Altered mental status 04/17/2018  ? Arthritis   ? Blood transfusion 2005  ? s/p hip replacement  ? Colon polyps   ? Environmental allergies   ?  pollen , leaves, grass  ? Erectile dysfunction   ? Glaucoma of left eye   ? legally blind in left eye  ? Hypertension   ? ? ?Past Surgical History:  ?Procedure Laterality Date  ? CATARACT EXTRACTION    ? bilateral  ? ESOPHAGOGASTRODUODENOSCOPY (EGD) WITH PROPOFOL N/A 04/22/2019  ? Procedure: ESOPHAGOGASTRODUODENOSCOPY (EGD) WITH PROPOFOL;  Surgeon: Ladene Artist, MD;  Location: [facility];  Service: Endoscopy;  Laterality: N/A;  ? EYE SURGERY    ? GLAUCOMA VALVE INSERTION  2010,2011  ? Left eye  ? HIP ARTHROPLASTY  2005  ? Right Hip replacement x 3  ? HOT HEMOSTASIS N/A 04/22/2019  ? Procedure: HOT HEMOSTASIS (ARGON PLASMA COAGULATION/BICAP);  Surgeon: Ladene Artist, MD;  Location: [facility];  Service: Endoscopy;  Laterality: N/A;  ? JOINT REPLACEMENT    ? MULTIPLE TOOTH EXTRACTIONS  2012  ? had 23 teeth pulled  ? TOENAIL EXCISION    ? TOTAL KNEE ARTHROPLASTY  12/15/2011  ? Procedure: TOTAL KNEE ARTHROPLASTY;  Surgeon: Kerin Salen, MD;  Location: [facility];  Service: Orthopedics;  Laterality: Right;  DEPUY SIGMA   ? ? ? reports that he quit smoking about 23 years ago. His smoking use included cigarettes. He has a 10.00 pack-year smoking history. He has quit using smokeless tobacco.  His smokeless tobacco use included chew. He reports current alcohol use. He reports that he does not use drugs. ? ?Allergies  ?Allergen Reactions  ? Ibuprofen Hives  ?  Conflict with other meds  ? Tylenol [Acetaminophen] Rash  ? ? ?History reviewed. No pertinent family history. ? ?Prior to Admission medications   ?Medication Sig Start Date End Date Taking? Authorizing Provider  ?ACTEMRA ACTPEN 162 MG/0.9ML SOAJ Inject 162 mg into the skin every Thursday. 11/12/21  Yes [provider]  ?baclofen (LIORESAL) 10 MG tablet Take 1 tablet (10 mg total) by mouth 2 (two) times daily. 04/18/18  Yes Alma Friendly, MD  ?brimonidine (ALPHAGAN) 0.2 % ophthalmic solution Place 1 drop into both eyes in the morning and at bedtime.  11/04/21  Yes [provider]  ?cetirizine (ZYRTEC) 10 MG tablet Take 10 mg by mouth daily.   Yes [provider]  ?clopidogrel (PLAVIX) 75 MG tablet Take 1 tablet (75 mg total) by mouth daily. 04/27/19  Yes Sheikh, Omair Latif, DO  ?diclofenac sodium (VOLTAREN) 1 % GEL Apply 1 application. topically in the morning and at bedtime. Left knee and both feet 04/15/18  Yes [provider]  ?dorzolamide (TRUSOPT) 2 % ophthalmic solution Place 1 drop into both eyes 2 (two) times daily. 01/03/22  Yes [provider]  ?FIBER ADULT GUMMIES PO Take 1 tablet by mouth daily.   Yes [provider]  ?folic acid (FOLVITE) 1 MG tablet Take 1 mg by mouth at bedtime.    Yes [provider]  ?gabapentin (NEURONTIN) 800 MG tablet Take 800 mg by mouth 4 (four) times daily.   Yes [provider]  ?HYDROcodone-acetaminophen (NORCO/VICODIN) 5-325 MG tablet Take 1 tablet by mouth every 8 (eight) hours as needed for moderate pain. 03/09/15  Yes [provider]  ?methotrexate 50 MG/2ML injection Inject 25 mg into the skin every Wednesday.   Yes [provider]  ?Omega-3 Fatty Acids (FISH OIL) 1000 MG CPDR Take 1,000 mg by mouth daily.   Yes [provider]  ?sulfaSALAzine (AZULFIDINE) 500 MG tablet Take 1,000 mg by mouth 2 (two) times daily. 01/03/22  Yes [provider]  ?traZODone (DESYREL) 150 MG tablet Take 150 mg by mouth at bedtime. 11/28/21  Yes [provider]  ?TURMERIC PO Take 1 capsule by mouth daily.   Yes [provider]  ?vitamin B-12 (CYANOCOBALAMIN) 1000 MCG tablet Take 1,000 mcg by mouth daily.   Yes [provider]  ?vitamin C (ASCORBIC ACID) 500 MG tablet Take 500 mg by mouth daily.   Yes [provider]  ?ondansetron (ZOFRAN) 4 MG tablet Take 1 tablet (4 mg total) by mouth every 6 (six) hours as needed for nausea. ?Patient not taking: Reported on 02/14/2022 04/24/19   Raiford Noble Latif, DO   ?pantoprazole (PROTONIX) 40 MG tablet Take 1 tablet (40 mg total) by mouth daily. ?Patient not taking: Reported on 02/14/2022 04/25/19   Kerney Elbe, DO  ? ? ?Physical Exam: ?Vitals:  ? 02/15/22 0415 02/15/22 0430 02/15/22 0445 02/15/22 0500  ?BP: (!) 177/71 (!) 165/92 (!) 179/74 (!) 168/76  ?Pulse: (!) 54 (!) 58 (!) 58 (!) 55  ?Resp: '20 20 20 20  '$ ?Temp:      ?SpO2: 99% 100% 99% 100%  ? ? ?Physical Exam ?Vitals reviewed.  ?Constitutional:   ?   General: He is not in acute distress. ?HENT:  ?   Head: Normocephalic and atraumatic.  ?Eyes:  ?   Extraocular Movements: Extraocular movements intact.  ?   Conjunctiva/sclera: Conjunctivae normal.  ?Cardiovascular:  ?   Rate and Rhythm: Normal rate and regular rhythm.  ?Pulmonary:  ?  Effort: Pulmonary effort is normal.  ?Abdominal:  ?   General: Bowel sounds are normal. There is no distension.  ?   Palpations: Abdomen is soft.  ?   Tenderness: There is no abdominal tenderness.  ?Musculoskeletal:  ?   Cervical back: Normal range of motion and neck supple.  ?   Comments: Left hip hematoma.  Extensive area of swelling and induration extending down the lateral aspect of the hip.  Neurovascularly intact distally.  ?Skin: ?   General: Skin is warm and dry.  ?Neurological:  ?   General: No focal deficit present.  ?   Mental Status: He is alert and oriented to person, place, and time.  ?  ? ?Labs on Admission: I have personally reviewed following labs and imaging studies ? ?CBC: ?Recent Labs  ?Lab 02/14/22 ?2300  ?WBC 6.0  ?HGB 11.7*  ?HCT 39.0  ?MCV 112.7*  ?PLT 160  ? ?Basic Metabolic Panel: ?Recent Labs  ?Lab 02/14/22 ?2300  ?NA 144  ?K 3.3*  ?CL 111  ?CO2 25  ?GLUCOSE 92  ?BUN 8  ?CREATININE 0.97  ?CALCIUM 8.8*  ? ?GFR: ?CrCl cannot be calculated (Unknown ideal weight.). ?Liver Function Tests: ?No results for input(s): AST, ALT, ALKPHOS, BILITOT, PROT, ALBUMIN in the last 168 hours. ?No results for input(s): LIPASE, AMYLASE in the last 168 hours. ?No results for  input(s): AMMONIA in the last 168 hours. ?Coagulation Profile: ?No results for input(s): INR, PROTIME in the last 168 hours. ?Cardiac Enzymes: ?No results for input(s): CKTOTAL, CKMB, CKMBINDEX, TROPONINI in the last 168 hours.

## 2022-02-15 NOTE — Progress Notes (Signed)
?                                  PROGRESS NOTE                                             ?                                                                                                                     ?                                         ? ? Patient Demographics:  ? ? Nathaniel [name], is a 87 y.o. male, DOB - 06-Jan-1935, DQQ:[id number] ? ?Outpatient Primary MD for the patient is Shirline Frees, MD    LOS - 0  Admit date - 02/14/2022   ? ?Chief Complaint  ?Patient presents with  ? Fall  ?    ? ?Brief Narrative (HPI from H&P) - 87 y.o. male with medical history significant of hypertension, GERD, rheumatoid arthritis, chronic pain, glaucoma with left eye blindness, chronic combined CHF, history of GI bleed presented to the ED with left lower extremity hematoma and bruising after a mechanical fall.  He is on Plavix 75 mg daily.  Hemoglobin stable compared to prior labs.  Potassium 3.3.  CT head negative for acute finding.  CT of left hip negative for fracture or dislocation.  Showing multiple hematomas over the anterolateral aspect of the hip with no contrast extravasation to suggest active hemorrhage.  ? ? Subjective:  ? ? [name] today has, No headache, No chest pain, No abdominal pain - No Nausea, No new weakness tingling or numbness, no cough ? ? Assessment  & Plan :  ? ? ?Hematoma - Left hip hematomas ?In the setting of falling on this hip multiple times recently.  He is on Plavix.  CT showing multiple hematomas over the anterolateral aspect of the hip with no contrast extravasation to suggest active hemorrhage.  His H&H is stable so far and we will closely monitor, continue to Hold Plavix, note he has had multiple falls at home recently PT OT to evaluate, may require SNF.  However him and his wife are both resistant towards placement, have been told that can sustain a serious fall at home if they go against PTs recommendation. ? ? ?Hypokalemia ?Placed will  monitor. ? ?Peripheral arterial disease ([place]) ?CT showing arterial vascular calcifications in the pelvis and the left lower extremity without evidence of significant stenosis. ?-Already on Plavix which is currently held ?-Outpatient follow-up ? ?Chronic pain ?-Continue home medications ? ?Rheumatoid arthritis ([place]) ?-On weekly Actemra and methotrexate ?-Continue sulfasalazine ?-Outpatient rheumatology follow-up ? ?Hypertension.  Start on Norvasc and monitor. ? ? ?   ? ?Condition - Fair ? ?Family Communication  :  wife [phone] on 02/15/22 ? ?Code Status :  Full ? ?Consults  :   ? ?PUD Prophylaxis :   ? ? Procedures  :    ? ?CT - 1. Multiple hematomas over the anterolateral aspect of the left hip with mixed attenuation, the largest measuring 16.2 x 5.0 x 10.0 cm. No contrast extravasation is identified to suggest active hemorrhage. 2. Arterial vascular calcifications in the pelvis and left lower extremity without evidence of significant stenosis. Multiple venous varices are identified in the subcutaneous soft tissues. 3. No acute fracture or dislocation. Moderate degenerative changes at the left hip.  ? ?   ? ?Disposition Plan  :   ? ?Status is: Observation ? ?DVT Prophylaxis  :   ? ?SCDs Start: 02/15/22 0505 ? ?Lab Results  ?Component Value Date  ? PLT 160 02/14/2022  ? ? ?Diet :  ?Diet Order   ? ?       ?  Diet Heart Room service appropriate? Yes; Fluid consistency: Thin  Diet effective now       ?  ? ?  ?  ? ?  ?  ? ?Inpatient Medications ? ?Scheduled Meds: ? baclofen  10 mg Oral BID  ? brimonidine  1 drop Both Eyes BID  ? dorzolamide  1 drop Both Eyes BID  ? gabapentin  800 mg Oral QID  ? potassium chloride  20 mEq Oral Once  ? sulfaSALAzine  1,000 mg Oral BID  ? traZODone  150 mg Oral QHS  ? ?Continuous Infusions: ?PRN Meds:.hydrALAZINE, HYDROcodone-acetaminophen ? ?Antibiotics  :   ? ?Anti-infectives (From admission, onward)  ? ? None  ? ?  ? ? ? Time Spent in minutes  30 ? ? ?Lala Lund M.D on 02/15/2022  at 9:09 AM ? ?To page go to [email or web address]  ? ?[facility] -  Office  [phone] ? ?See all Orders from today for further details ? ? ? Objective:  ? ?Vitals:  ? 02/15/22 2993 02/15/22 0729 02/15/22 0730 02/15/22 0743  ?BP: (!) 153/70 (!) 167/83 (!) 181/81 (!) 181/81  ?Pulse: 62 95 66   ?Resp: 20 18    ?Temp:      ?SpO2: 98% 99% 98%   ? ? ?Wt Readings from Last 3 Encounters:  ?04/24/19 85.1 kg  ?04/19/15 81.6 kg  ?02/03/13 77.6 kg  ? ? ? ?Intake/Output Summary (Last 24 hours) at 02/15/2022 0909 ?Last data filed at 02/15/2022 0756 ?Gross per 24 hour  ?Intake --  ?Output 1450 ml  ?Net -1450 ml  ? ? ? ?Physical Exam ? ?Awake Alert, No new F.N deficits, Normal affect ?[place].AT,PERRAL ?Supple Neck, No JVD,   ?Symmetrical Chest wall movement, Good air movement bilaterally, CTAB ?RRR,No Gallops,Rubs or new Murmurs,  ?+ve B.Sounds, Abd Soft, No tenderness,   ?No Cyanosis, L.Hip large bruise  ?  ? ? Data Review:  ? ? ?CBC ?Recent Labs  ?Lab 02/14/22 ?2300 02/15/22 ?7169  ?WBC 6.0  --   ?HGB 11.7* 10.8*  ?HCT 39.0 35.0*  ?PLT 160  --   ?MCV 112.7*  --   ?MCH 33.8  --   ?MCHC 30.0  --   ?RDW 16.3*  --   ? ? ?Electrolytes ?Recent Labs  ?Lab 02/14/22 ?2300 02/15/22 ?0750  ?NA 144  --   ?K 3.3*  --   ?CL 111  --   ?CO2 25  --   ?  GLUCOSE 92  --   ?BUN 8  --   ?CREATININE 0.97  --   ?CALCIUM 8.8*  --   ?MG  --  1.9  ? ? ?------------------------------------------------------------------------------------------------------------------ ?No results for input(s): CHOL, HDL, LDLCALC, TRIG, CHOLHDL, LDLDIRECT in the last 72 hours. ? ?Lab Results  ?Component Value Date  ? HGBA1C 6.3 (H) 04/20/2015  ? ? ?No results for input(s): TSH, T4TOTAL, T3FREE, THYROIDAB in the last 72 hours. ? ?Invalid input(s): FREET3 ?------------------------------------------------------------------------------------------------------------------ ?ID Labs ?Recent Labs  ?Lab 02/14/22 ?2300  ?WBC 6.0  ?PLT 160  ?CREATININE 0.97  ? ?Cardiac Enzymes ?No results  for input(s): CKMB, TROPONINI, MYOGLOBIN in the last 168 hours. ? ?Invalid input(s): CK ? ? ?  ? ? ?Micro Results ?No results found for this or any previous visit (from the past 240 hour(s)). ? ?Radiology Reports ?CT Head Wo Contrast ? ?Result Date: 02/14/2022 ?CLINICAL DATA:  [place], facial and head trauma EXAM: CT HEAD WITHOUT CONTRAST TECHNIQUE: Contiguous axial images were obtained from the base of the skull through the vertex without intravenous contrast. RADIATION DOSE REDUCTION: This exam was performed according to the departmental dose-optimization program which includes automated exposure control, adjustment of the mA and/or kV according to patient size and/or use of iterative reconstruction technique. COMPARISON:  04/17/2018 FINDINGS: Brain: No acute infarct or hemorrhage. Chronic infarct left cerebellar hemisphere again noted. Lateral ventricles and midline structures are unremarkable. No acute extra-axial fluid collections. No mass effect. Vascular: No hyperdense vessel or unexpected calcification. Skull: Normal. Negative for fracture or focal lesion. Sinuses/Orbits: No acute finding. Stable postsurgical changes left globe. Other: None. IMPRESSION: 1. Stable head CT, no acute intracranial process. Electronically Signed   By: Randa Ngo M.D.   On: 02/14/2022 23:30  ? ?CT Hip Left W and/or Wo Contrast ? ?Result Date: 02/15/2022 ?CLINICAL DATA:  Left hip hematoma following fall. EXAM: CT OF THE LOWER LEFT EXTREMITY WITHOUT CONTRAST TECHNIQUE: Multidetector CT imaging of the lower left extremity was performed following the standard protocol before and during bolus administration of intravenous contrast. RADIATION DOSE REDUCTION: This exam was performed according to the departmental dose-optimization program which includes automated exposure control, adjustment of the mA and/or kV according to patient size and/or use of iterative reconstruction technique. CONTRAST:  126m OMNIPAQUE IOHEXOL 300 MG/ML  SOLN  COMPARISON:  02/14/2022 FINDINGS: Bones/Joint/Cartilage No acute fracture or dislocation. Moderate joint space narrowing, subchondral sclerosis and subchondral cysts are noted at the left hip. Degenerative changes are

## 2022-02-15 NOTE — Assessment & Plan Note (Signed)
CT showing arterial vascular calcifications in the pelvis and the left lower extremity without evidence of significant stenosis. ?-Already on Plavix which is currently held ?-Outpatient follow-up ?

## 2022-02-15 NOTE — ED Notes (Signed)
Toiletry items provided to pt's wife who reports she will wash pt up ?

## 2022-02-16 DIAGNOSIS — T148XXA Other injury of unspecified body region, initial encounter: Secondary | ICD-10-CM | POA: Diagnosis not present

## 2022-02-16 LAB — BRAIN NATRIURETIC PEPTIDE: B Natriuretic Peptide: 63.6 pg/mL (ref 0.0–100.0)

## 2022-02-16 LAB — COMPREHENSIVE METABOLIC PANEL
ALT: 12 U/L (ref 0–44)
AST: 17 U/L (ref 15–41)
Albumin: 3.2 g/dL — ABNORMAL LOW (ref 3.5–5.0)
Alkaline Phosphatase: 45 U/L (ref 38–126)
Anion gap: 4 — ABNORMAL LOW (ref 5–15)
BUN: 6 mg/dL — ABNORMAL LOW (ref 8–23)
CO2: 26 mmol/L (ref 22–32)
Calcium: 8.6 mg/dL — ABNORMAL LOW (ref 8.9–10.3)
Chloride: 110 mmol/L (ref 98–111)
Creatinine, Ser: 0.92 mg/dL (ref 0.61–1.24)
GFR, Estimated: >60 mL/min (ref >60)
Glucose, Bld: 102 mg/dL — ABNORMAL HIGH (ref 70–99)
Potassium: 3.7 mmol/L (ref 3.5–5.1)
Sodium: 140 mmol/L (ref 135–145)
Total Bilirubin: 0.8 mg/dL (ref 0.3–1.2)
Total Protein: 5.1 g/dL — ABNORMAL LOW (ref 6.5–8.1)

## 2022-02-16 LAB — MAGNESIUM: Magnesium: 2 mg/dL (ref 1.7–2.4)

## 2022-02-16 LAB — CBC WITH DIFFERENTIAL/PLATELET
Abs Immature Granulocytes: 0.02 10*3/uL (ref 0.00–0.07)
Basophils Absolute: 0 10*3/uL (ref 0.0–0.1)
Basophils Relative: 0 %
Eosinophils Absolute: 0.3 10*3/uL (ref 0.0–0.5)
Eosinophils Relative: 5 %
HCT: 34.7 % — ABNORMAL LOW (ref 39.0–52.0)
Hemoglobin: 11 g/dL — ABNORMAL LOW (ref 13.0–17.0)
Immature Granulocytes: 0 %
Lymphocytes Relative: 13 %
Lymphs Abs: 0.7 10*3/uL (ref 0.7–4.0)
MCH: 35 pg — ABNORMAL HIGH (ref 26.0–34.0)
MCHC: 31.7 g/dL (ref 30.0–36.0)
MCV: 110.5 fL — ABNORMAL HIGH (ref 80.0–100.0)
Monocytes Absolute: 0.4 10*3/uL (ref 0.1–1.0)
Monocytes Relative: 8 %
Neutro Abs: 4.3 10*3/uL (ref 1.7–7.7)
Neutrophils Relative %: 74 %
Platelets: 149 10*3/uL — ABNORMAL LOW (ref 150–400)
RBC: 3.14 MIL/uL — ABNORMAL LOW (ref 4.22–5.81)
RDW: 16.1 % — ABNORMAL HIGH (ref 11.5–15.5)
WBC: 5.8 10*3/uL (ref 4.0–10.5)
nRBC: 0 % (ref 0.0–0.2)

## 2022-02-16 MED ORDER — CLOPIDOGREL BISULFATE 75 MG PO TABS: 75.0000 mg | ORAL_TABLET | Freq: Every day | ORAL | 0 refills | Status: AC

## 2022-02-16 MED ORDER — AMLODIPINE BESYLATE 10 MG PO TABS: 10.0000 mg | ORAL_TABLET | Freq: Every day | ORAL | 0 refills | Status: AC

## 2022-02-16 NOTE — Evaluation (Signed)
Physical Therapy Evaluation ?Patient Details ?Name: Nathaniel [name] ?MRN: [id number] ?DOB: 1935-02-12 ?Today's Date: 02/16/2022 ? ?History of Present Illness ? 87 y.o. male presented to the ED 02/14/22 with left lower extremity hematoma and bruising after a mechanical fall.  He is on Plavix 75 mg daily. CT head negative for acute finding. CT of left hip negative for fracture or dislocation.  Showing multiple hematomas over the anterolateral aspect of the hip  PMH significant of hypertension, GERD, rheumatoid arthritis, chronic pain, glaucoma with left eye blindness, chronic combined CHF, history of GI bleed, Rt THA x3, TKA  ?Clinical Impression ?  ?Pt admitted secondary to problem above with deficits below. PTA patient was living with spouse in one level home with 2" step to enter. He primarily uses a motorized chair for locomotion inside home, however this will not go into his bathroom and he walks in/out. He does not use his RW (although he states it will fit into the bathroom).  Pt currently requires min assist to stand from regular height surface (he is used to lift chair and bar-height chairs). He walks with RW and minguard assist with no imbalance and good, safe use of RW. Feel patient can return home with his wife's assistance and current DME. He agrees to HHPT safety evaluation due to his recent falls.  Anticipate patient will benefit from PT to address problems listed below.Will continue to follow acutely to maximize functional mobility independence and safety.   ?   ?   ? ?Recommendations for follow up therapy are one component of a multi-disciplinary discharge planning process, led by the attending physician.  Recommendations may be updated based on patient status, additional functional criteria and insurance authorization. ? ?Follow Up Recommendations Home health PT ? ?  ?Assistance Recommended at Discharge Intermittent Supervision/Assistance  ?Patient can return home with the following ? A little help with  walking and/or transfers;Assistance with cooking/housework;Help with stairs or ramp for entrance ? ?  ?Equipment Recommendations None recommended by PT  ?Recommendations for Other Services ? OT consult  ?  ?Functional Status Assessment Patient has had a recent decline in their functional status and demonstrates the ability to make significant improvements in function in a reasonable and predictable amount of time.  ? ?  ?Precautions / Restrictions Precautions ?Precautions: Fall ?Precaution Comments: 3 recent falls; one with walker outside, one in shower, last he could not recall circumstances ?Restrictions ?Weight Bearing Restrictions: No  ? ?  ? ?Mobility ? Bed Mobility ?Overal bed mobility: Modified Independent ?  ?  ?  ?  ?  ?  ?General bed mobility comments: incr time and effort, but no physical assist ?  ? ?Transfers ?Overall transfer level: Needs assistance ?Equipment used: Rolling walker (2 wheels) ?Transfers: Sit to/from Stand ?Sit to Stand: Min assist ?  ?  ?  ?  ?  ?General transfer comment: from EOB; pt reports at home he uses lift recliner and sits on bar height chairs ?  ? ?Ambulation/Gait ?Ambulation/Gait assistance: Min guard ?Gait Distance (Feet): 35 Feet ?Assistive device: Rolling walker (2 wheels) ?Gait Pattern/deviations: Step-through pattern, Decreased stride length, Decreased weight shift to left, Trunk flexed ?Gait velocity: decr ?Gait velocity interpretation: <1.8 ft/sec, indicate of risk for recurrent falls ?  ?General Gait Details: pt did a nice job of keeping RW close to his body and reports he doesn't always remember to keep it close like this; reviewed increased chance of UEs helping to catch him with RW close to his body  and pt verbalized understanding ? ?Stairs ?  ?  ?  ?  ?  ? ?Wheelchair Mobility ?  ? ?Modified Rankin (Stroke Patients Only) ?  ? ?  ? ?Balance Overall balance assessment: Needs assistance, History of Falls ?Sitting-balance support: No upper extremity supported, Feet  supported ?Sitting balance-Leahy Scale: Fair ?  ?  ?Standing balance support: Bilateral upper extremity supported, Reliant on assistive device for balance ?Standing balance-Leahy Scale: Poor ?  ?  ?  ?  ?  ?  ?  ?  ?  ?  ?  ?  ?   ? ? ? ?Pertinent Vitals/Pain Pain Assessment ?Pain Assessment: Faces ?Faces Pain Scale: Hurts little more ?Pain Location: left hip/thigh ?Pain Descriptors / Indicators: Discomfort ?Pain Intervention(s): Limited activity within patient's tolerance, Monitored during session, Repositioned  ? ? ?Home Living Family/patient expects to be discharged to:: Private residence ?Living Arrangements: Spouse/significant other ?Available Help at Discharge: Family;Available 24 hours/day ?Type of Home: House ?Home Access: Stairs to enter ?Entrance Stairs-Rails: None ?Entrance Stairs-Number of Steps: 1 ?  ?Home Layout: One level ?Home Equipment: Conservation officer, nature (2 wheels);Other (comment);Shower seat;BSC/3in1 (power chair) ?   ?  ?Prior Function Prior Level of Function : Needs assist ?  ?  ?  ?  ?  ?  ?Mobility Comments: uses his power chair throughout the house (except bathroom); walks into bathroom holding counter and doorframe; looking for light weight chair to take to appointments for wife to push him in ?ADLs Comments: walks him into the shower; helps with shower; helps with drying off; he can dress himself except socks; asssist with IADLs ?  ? ? ?Hand Dominance  ?   ? ?  ?Extremity/Trunk Assessment  ? Upper Extremity Assessment ?Upper Extremity Assessment: Defer to OT evaluation ?  ? ?Lower Extremity Assessment ?Lower Extremity Assessment: RLE deficits/detail;LLE deficits/detail ?RLE Deficits / Details: hip WFL, knee flexion 110 to 0 extension; hip/knee extension 4+/5 ?LLE Deficits / Details: hip WFL, knee flexion 110 to 0 extension; hip/knee extension 4/5; +edema proximal thigh ?  ? ?Cervical / Trunk Assessment ?Cervical / Trunk Assessment: Kyphotic  ?Communication  ? Communication: No difficulties   ?Cognition Arousal/Alertness: Awake/alert ?Behavior During Therapy: [facility] for tasks assessed/performed ?Overall Cognitive Status: Within Functional Limits for tasks assessed ?  ?  ?  ?  ?  ?  ?  ?  ?  ?  ?  ?  ?  ?  ?  ?  ?  ?  ?  ? ?  ?General Comments General comments (skin integrity, edema, etc.): VSS on telemetry ? ?  ?Exercises General Exercises - Lower Extremity ?Heel Slides: AROM, Both, 5 reps  ? ?Assessment/Plan  ?  ?PT Assessment Patient needs continued PT services  ?PT Problem List Decreased strength;Decreased range of motion;Decreased balance;Decreased mobility;Decreased knowledge of use of DME;Decreased safety awareness;Decreased knowledge of precautions;Pain ? ?   ?  ?PT Treatment Interventions DME instruction;Gait training;Functional mobility training;Therapeutic activities;Therapeutic exercise;Balance training;Patient/family education   ? ?PT Goals (Current goals can be found in the Care Plan section)  ?Acute Rehab PT Goals ?Patient Stated Goal: to return home ?PT Goal Formulation: With patient ?Time For Goal Achievement: 03/02/22 ?Potential to Achieve Goals: Good ? ?  ?Frequency Min 3X/week ?  ? ? ?Co-evaluation   ?  ?  ?  ?  ? ? ?  ?AM-PAC PT "6 Clicks" Mobility  ?Outcome Measure Help needed turning from your back to your side while in a flat bed without using bedrails?:  None ?Help needed moving from lying on your back to sitting on the side of a flat bed without using bedrails?: None ?Help needed moving to and from a bed to a chair (including a wheelchair)?: A Little ?Help needed standing up from a chair using your arms (e.g., wheelchair or bedside chair)?: A Little ?Help needed to walk in hospital room?: A Little ?Help needed climbing 3-5 steps with a railing? : A Little ?6 Click Score: 20 ? ?  ?End of Session Equipment Utilized During Treatment: Gait belt ?Activity Tolerance: Patient tolerated treatment well ?Patient left: in chair;with call bell/phone within reach;with chair alarm set ?Nurse  Communication: Mobility status;Other (comment) (on chair alarm) ?PT Visit Diagnosis: Muscle weakness (generalized) (M62.81);Repeated falls (R29.6);Difficulty in walking, not elsewhere classified (R26.2) ?  ? ?Time:

## 2022-02-16 NOTE — Plan of Care (Signed)

## 2022-02-16 NOTE — Discharge Instructions (Signed)
Follow with Primary MD Shirline Frees, MD in 7 days  ? ?Get CBC, CMP, 2 view Chest X ray -  checked next visit within 1 week by Primary MD  ? ?Activity: As tolerated with Full fall precautions use walker/cane & assistance as needed ? ?Disposition Home   ? ?Diet: Heart Healthy   ? ?Special Instructions: If you have smoked or chewed Tobacco  in the last 2 yrs please stop smoking, stop any regular Alcohol  and or any Recreational drug use. ? ?On your next visit with your primary care physician please Get Medicines reviewed and adjusted. ? ?Please request your Prim.MD to go over all Hospital Tests and Procedure/Radiological results at the follow up, please get all Hospital records sent to your Prim MD by signing hospital release before you go home. ? ?If you experience worsening of your admission symptoms, develop shortness of breath, life threatening emergency, suicidal or homicidal thoughts you must seek medical attention immediately by calling 911 or calling your MD immediately  if symptoms less severe. ? ?You Must read complete instructions/literature along with all the possible adverse reactions/side effects for all the Medicines you take and that have been prescribed to you. Take any new Medicines after you have completely understood and accpet all the possible adverse reactions/side effects.  ? ?  ? ?

## 2022-02-16 NOTE — Care Management Obs Status (Signed)
MEDICARE OBSERVATION STATUS NOTIFICATION ? ? ?Patient Details  ?Name: Nathaniel [name] ?MRN: [id number] ?Date of Birth: [birth year] ? ? ?Medicare Observation Status Notification Given:  Yes ? ? ? ?Carles Collet, RN ?02/16/2022, 9:21 AM ?

## 2022-02-16 NOTE — Evaluation (Signed)
Occupational Therapy Evaluation ?Patient Details ?Name: Nathaniel [name] ?MRN: [id number] ?DOB: 03-21-1935 ?Today's Date: 02/16/2022 ? ? ?History of Present Illness 87 y.o. male presented to the ED 02/14/22 with left lower extremity hematoma and bruising after a mechanical fall.  He is on Plavix 75 mg daily. CT head negative for acute finding. CT of left hip negative for fracture or dislocation.  Showing multiple hematomas over the anterolateral aspect of the hip  PMH significant of hypertension, GERD, rheumatoid arthritis, chronic pain, glaucoma with left eye blindness, chronic combined CHF, history of GI bleed, Rt THA x3, TKA  ? ?Clinical Impression ?  ?Pt admitted for concerns listed above. PTA pt reported that he was fairly independent, using a RW and electric chair for mobility, and independent with all ADL's, except for shower transfers and foot care. Pt has had 3 recent falls. At this time, pt is near his baseline. Pt mobilizes slowly with RW close. OT reviewed safe transfers and RW management. Recommending [facility] OT for home and ADL safety. Pt has no further acute OT needs and OT will sign off.  ?   ? ?Recommendations for follow up therapy are one component of a multi-disciplinary discharge planning process, led by the attending physician.  Recommendations may be updated based on patient status, additional functional criteria and insurance authorization.  ? ?Follow Up Recommendations ? Home health OT  ?  ?Assistance Recommended at Discharge Intermittent Supervision/Assistance  ?Patient can return home with the following A little help with walking and/or transfers;A little help with bathing/dressing/bathroom;Assistance with cooking/housework ? ?  ?Functional Status Assessment ? Patient has had a recent decline in their functional status and demonstrates the ability to make significant improvements in function in a reasonable and predictable amount of time.  ?Equipment Recommendations ? None recommended by OT  ?   ?Recommendations for Other Services   ? ? ?  ?Precautions / Restrictions Precautions ?Precautions: Fall ?Precaution Comments: 3 recent falls; one with walker outside, one in shower, last he could not recall circumstances stated he just at down ?Restrictions ?Weight Bearing Restrictions: No  ? ?  ? ?Mobility Bed Mobility ?Overal bed mobility: Modified Independent ?  ?  ?  ?  ?  ?  ?General bed mobility comments: Up in recliner on entry ?  ? ?Transfers ?Overall transfer level: Needs assistance ?Equipment used: Rolling walker (2 wheels) ?Transfers: Sit to/from Stand ?Sit to Stand: Min guard ?  ?  ?  ?  ?  ?General transfer comment: Min guard from recliner and BSC ?  ? ?  ?Balance Overall balance assessment: Needs assistance, History of Falls ?Sitting-balance support: No upper extremity supported, Feet supported ?Sitting balance-Leahy Scale: Fair ?  ?  ?Standing balance support: Bilateral upper extremity supported, Reliant on assistive device for balance ?Standing balance-Leahy Scale: Poor ?  ?  ?  ?  ?  ?  ?  ?  ?  ?  ?  ?  ?   ? ?ADL either performed or assessed with clinical judgement  ? ?ADL Overall ADL's : At baseline ?  ?  ?  ?  ?  ?  ?  ?  ?  ?  ?  ?  ?  ?  ?  ?  ?  ?  ?  ?General ADL Comments: Pt most likely at/near baseline, able to complete ADL's with supervision-min guard, unable to doff/donn socks  ? ? ? ?Vision Baseline Vision/History: 3 Glaucoma ?Ability to See in Adequate Light: 2 Moderately impaired ?  Patient Visual Report: No change from baseline ?Vision Assessment?: No apparent visual deficits  ?   ?Perception   ?  ?Praxis   ?  ? ?Pertinent Vitals/Pain Pain Assessment ?Pain Assessment: Faces ?Faces Pain Scale: Hurts little more ?Pain Location: left hip/thigh ?Pain Descriptors / Indicators: Discomfort ?Pain Intervention(s): Monitored during session, Repositioned  ? ? ? ?Hand Dominance Right ?  ?Extremity/Trunk Assessment Upper Extremity Assessment ?Upper Extremity Assessment: RUE deficits/detail;LUE  deficits/detail ?RUE Deficits / Details: Limited ROM - shoulder flexion >90 degrees, 3+/5 shoulder flexion, otherwise 4/5 overall. ?RUE Sensation: WNL ?RUE Coordination: decreased gross motor ?LUE Deficits / Details: Limited ROM- shoulder flex around 100 degrees, weak shoulder flexion 3+/5, all other MMT 4/5. ?LUE Sensation: WNL ?LUE Coordination: WNL ?  ?Lower Extremity Assessment ?Lower Extremity Assessment: Defer to PT evaluation ?RLE Deficits / Details: hip WFL, knee flexion 110 to 0 extension; hip/knee extension 4+/5 ?LLE Deficits / Details: hip WFL, knee flexion 110 to 0 extension; hip/knee extension 4/5; +edema proximal thigh ?  ?Cervical / Trunk Assessment ?Cervical / Trunk Assessment: Kyphotic ?  ?Communication Communication ?Communication: No difficulties ?  ?Cognition Arousal/Alertness: Awake/alert ?Behavior During Therapy: [facility] for tasks assessed/performed ?Overall Cognitive Status: Within Functional Limits for tasks assessed ?  ?  ?  ?  ?  ?  ?  ?  ?  ?  ?  ?  ?  ?  ?  ?  ?  ?  ?  ?General Comments  VSS on RA ? ?  ?Exercises Exercises: General Lower Extremity ?General Exercises - Lower Extremity ?Heel Slides: AROM, Both, 5 reps ?  ?Shoulder Instructions    ? ? ?Home Living Family/patient expects to be discharged to:: Private residence ?Living Arrangements: Spouse/significant other ?Available Help at Discharge: Family;Available 24 hours/day ?Type of Home: House ?Home Access: Stairs to enter ?Entrance Stairs-Number of Steps: 1 ?Entrance Stairs-Rails: None ?Home Layout: One level ?  ?  ?Bathroom Shower/Tub: Walk-in shower ?  ?Bathroom Toilet: Handicapped height ?Bathroom Accessibility: No (yes for walker; no for power chair) ?  ?Home Equipment: Conservation officer, nature (2 wheels);Other (comment);Shower seat;BSC/3in1 (power chair) ?  ?  ?  ? ?  ?Prior Functioning/Environment Prior Level of Function : Needs assist ?  ?  ?  ?  ?  ?  ?Mobility Comments: uses his power chair throughout the house (except bathroom); walks  into bathroom holding counter and doorframe; looking for light weight chair to take to appointments for wife to push him in ?ADLs Comments: walks him into the shower; helps with shower; helps with drying off; he can dress himself except socks; asssist with IADLs ?  ? ?  ?  ?OT Problem List: Decreased strength;Decreased activity tolerance;Impaired balance (sitting and/or standing);Decreased safety awareness ?  ?   ?OT Treatment/Interventions:    ?  ?OT Goals(Current goals can be found in the care plan section) Acute Rehab OT Goals ?Patient Stated Goal: To go home ?OT Goal Formulation: With patient ?Time For Goal Achievement: 02/16/22 ?Potential to Achieve Goals: Good  ?OT Frequency:   ?  ? ?Co-evaluation   ?  ?  ?  ?  ? ?  ?AM-PAC OT "6 Clicks" Daily Activity     ?Outcome Measure Help from another person eating meals?: None ?Help from another person taking care of personal grooming?: None ?Help from another person toileting, which includes using toliet, bedpan, or urinal?: A Little ?Help from another person bathing (including washing, rinsing, drying)?: A Little ?Help from another person to put on and  taking off regular upper body clothing?: A Little ?Help from another person to put on and taking off regular lower body clothing?: A Lot ?6 Click Score: 19 ?  ?End of Session Equipment Utilized During Treatment: Gait belt;Rolling walker (2 wheels) ?Nurse Communication: Mobility status ? ?Activity Tolerance: Patient tolerated treatment well ?Patient left: in chair;with call bell/phone within reach;with chair alarm set ? ?OT Visit Diagnosis: Unsteadiness on feet (R26.81);Other abnormalities of gait and mobility (R26.89);Muscle weakness (generalized) (M62.81)  ?              ?Time: 0110-0349 ?OT Time Calculation (min): 15 min ?Charges:  OT General Charges ?$OT Visit: 1 Visit ?OT Evaluation ?$OT Eval Moderate Complexity: 1 Mod ? ?Tirza Senteno H., OTR/L ?Acute Rehabilitation ? ?Giavonni Cizek Elane Yolanda Bonine ?02/16/2022, 10:15 AM ?

## 2022-02-16 NOTE — Discharge Summary (Addendum)
?                                                                                ? Nathaniel [name] EXN:[id number] DOB: 10-07-1935 DOA: 02/14/2022 ? ?PCP: Shirline Frees, MD ? ?Admit date: 02/14/2022  Discharge date: 02/16/2022 ? ?Admitted From: Home   Disposition:  Home ? ? ?Recommendations for Outpatient Follow-up:  ? ?Follow up with PCP in 1-2 weeks ? ?PCP Please obtain BMP/CBC, 2 view CXR in 1week,  (see Discharge instructions)  ? ?PCP Please follow up on the following pending results: Monitor left hip hematoma closely, check CBC along with BMP and magnesium in 7 to 10 days. ? ? ?Home Health: PT, OT, RN if he qualifies ?Equipment/Devices: As below ?Consultations: None  ?Discharge Condition: Stable    ?CODE STATUS: Full    ?Diet Recommendation: Heart Healthy  ?  ? ?Chief Complaint  ?Patient presents with  ? Fall  ?  ? ?Brief history of present illness from the day of admission and additional interim summary   ? ?87 y.o. male with medical history significant of hypertension, GERD, rheumatoid arthritis, chronic pain, glaucoma with left eye blindness, chronic combined CHF, history of GI bleed presented to the ED with left lower extremity hematoma and bruising after a mechanical fall.  He is on Plavix 75 mg daily.  Hemoglobin stable compared to prior labs.  Potassium 3.3.  CT head negative for acute finding.  CT of left hip negative for fracture or dislocation.  Showing multiple hematomas over the anterolateral aspect of the hip with no contrast extravasation to suggest active hemorrhage.  ? ?                                                               Hospital Course  ? ? ?Hematoma - Left hip - In the setting of falling on this hip multiple times recently in the setting of being on long-term Plavix.  CT showing multiple hematomas over the anterolateral aspect of the hip with no contrast extravasation to suggest active hemorrhage.   Fortunately his H&H remained stable, continue to Hold Plavix for another few days, note he has had multiple falls at home, also discussed with his wife, he and his wife are not interested in placement to SNF, he worked well with PT and is stable to be discharged home per PT, will be discharged home with home PT OT and RN if he qualifies along with rolling walker and 3in1.  Follow with PCP closely request PCP to monitor his left hip hematoma site closely along with a CBC, Plavix will be resumed in a few day. ?  ?  ?Hypokalemia ?Replaced PCP to recheck. ?  ?Peripheral arterial disease ([place]) ?CT showing arterial vascular calcifications in the pelvis and the left lower extremity without evidence of significant stenosis. ?-Already on Plavix which is currently held as in #1 above ?-Outpatient follow-up ?  ?Chronic pain ?-Continue home medications ?  ?Rheumatoid arthritis ([place]) ?-  On weekly Actemra and methotrexate ?-Continue sulfasalazine ?-Outpatient rheumatology follow-up ?  ?Hypertension poorly controlled.  started on Norvasc. ?  ? ?Discharge diagnosis   ? ? ?Principal Problem: ?  Hematoma ?Active Problems: ?  Rheumatoid arthritis ([place]) ?  Chronic pain ?  Acute on chronic combined systolic and diastolic CHF (congestive heart failure) ([place]) ?  HTN (hypertension) ?  Peripheral arterial disease ([place]) ?  Hypokalemia ? ? ? ?Discharge instructions   ? ?Discharge Instructions   ? ? Discharge instructions   Complete by: As directed ?  ? Follow with Primary MD Shirline Frees, MD in 7 days  ? ?Get CBC, CMP, 2 view Chest X ray -  checked next visit within 1 week by Primary MD  ? ?Activity: As tolerated with Full fall precautions use walker/cane & assistance as needed ? ?Disposition Home   ? ?Diet: Heart Healthy   ? ?Special Instructions: If you have smoked or chewed Tobacco  in the last 2 yrs please stop smoking, stop any regular Alcohol  and or any Recreational drug use. ? ?On your next visit with your primary care physician  please Get Medicines reviewed and adjusted. ? ?Please request your Prim.MD to go over all Hospital Tests and Procedure/Radiological results at the follow up, please get all Hospital records sent to your Prim MD by signing hospital release before you go home. ? ?If you experience worsening of your admission symptoms, develop shortness of breath, life threatening emergency, suicidal or homicidal thoughts you must seek medical attention immediately by calling 911 or calling your MD immediately  if symptoms less severe. ? ?You Must read complete instructions/literature along with all the possible adverse reactions/side effects for all the Medicines you take and that have been prescribed to you. Take any new Medicines after you have completely understood and accpet all the possible adverse reactions/side effects.  ? Increase activity slowly   Complete by: As directed ?  ? ?  ? ? ?Discharge Medications  ? ?Allergies as of 02/16/2022   ? ?   Reactions  ? Ibuprofen Hives  ? Conflict with other meds  ? Tylenol [acetaminophen] Rash  ? ?  ? ?  ?Medication List  ?  ? ?STOP taking these medications   ? ?ondansetron 4 MG tablet ?Commonly known as: ZOFRAN ?  ? ?  ? ?TAKE these medications   ? ?Actemra ACTPen 162 MG/0.9ML So[place] ?Generic drug: Tocilizumab ?Inject 162 mg into the skin every Thursday. ?  ?amLODipine 10 MG tablet ?Commonly known as: NORVASC ?Take 1 tablet (10 mg total) by mouth daily. ?Start taking on: February 17, 2022 ?  ?baclofen 10 MG tablet ?Commonly known as: LIORESAL ?Take 1 tablet (10 mg total) by mouth 2 (two) times daily. ?  ?brimonidine 0.2 % ophthalmic solution ?Commonly known as: ALPHAGAN ?Place 1 drop into both eyes in the morning and at bedtime. ?  ?cetirizine 10 MG tablet ?Commonly known as: ZYRTEC ?Take 10 mg by mouth daily. ?  ?clopidogrel 75 MG tablet ?Commonly known as: PLAVIX ?Take 1 tablet (75 mg total) by mouth daily. ?Start taking on: February 19, 2022 ?What changed: These instructions start on February 19, 2022. If you are unsure what to do until then, ask your doctor or other care provider. ?  ?diclofenac sodium 1 % Gel ?Commonly known as: VOLTAREN ?Apply 1 application. topically in the morning and at bedtime. Left knee and both feet ?  ?dorzolamide 2 % ophthalmic solution ?Commonly known as: TRUSOPT ?Place 1 drop  into both eyes 2 (two) times daily. ?  ?FIBER ADULT GUMMIES PO ?Take 1 tablet by mouth daily. ?  ?Fish Oil 1000 MG Cpdr ?Take 1,000 mg by mouth daily. ?  ?folic acid 1 MG tablet ?Commonly known as: FOLVITE ?Take 1 mg by mouth at bedtime. ?  ?gabapentin 800 MG tablet ?Commonly known as: NEURONTIN ?Take 800 mg by mouth 4 (four) times daily. ?  ?HYDROcodone-acetaminophen 5-325 MG tablet ?Commonly known as: NORCO/VICODIN ?Take 1 tablet by mouth every 8 (eight) hours as needed for moderate pain. ?  ?methotrexate 50 MG/2ML injection ?Inject 25 mg into the skin every Wednesday. ?  ?pantoprazole 40 MG tablet ?Commonly known as: PROTONIX ?Take 1 tablet (40 mg total) by mouth daily. ?  ?sulfaSALAzine 500 MG tablet ?Commonly known as: AZULFIDINE ?Take 1,000 mg by mouth 2 (two) times daily. ?  ?traZODone 150 MG tablet ?Commonly known as: DESYREL ?Take 150 mg by mouth at bedtime. ?  ?TURMERIC PO ?Take 1 capsule by mouth daily. ?  ?vitamin B-12 1000 MCG tablet ?Commonly known as: CYANOCOBALAMIN ?Take 1,000 mcg by mouth daily. ?  ?vitamin C 500 MG tablet ?Commonly known as: ASCORBIC ACID ?Take 500 mg by mouth daily. ?  ? ?  ? ?  ?  ? ? ?  ?Durable Medical Equipment  ?(From admission, onward)  ?  ? ? ?  ? ?  Start     Ordered  ? 02/16/22 0847  For home use only DME 4 wheeled rolling walker with seat  Once       ?Question:  Patient needs a walker to treat with the following condition  Answer:  Weakness  ? 02/16/22 0846  ? 02/16/22 0847  For home use only DME 3 n 1  Once       ? 02/16/22 0846  ? ?  ?  ? ?  ? ? ? Follow-up Information   ? ? Shirline Frees, MD. Schedule an appointment as soon as possible for a visit in 1  week(s).   ?Specialty: Family Medicine ?Contact information: ?[address] ?[address] ?[place] [address] ?[phone] ? ? ?  ?  ? ?  ?  ? ?  ? ? ?Major procedures and Radiology Reports - PLEASE review

## 2022-02-16 NOTE — TOC Transition Note (Addendum)
Transition of Care (TOC) - CM/SW Discharge Note ? ? ?Patient Details  ?Name: Nathaniel [name] ?MRN: [id number] ?Date of Birth: [birth year] ? ?Transition of Care (TOC) CM/SW Contact:  ?Carles Collet, RN ?Phone Number: ?02/16/2022, 9:34 AM ? ? ?Clinical Narrative:   spoke w patient, discussed HH needs and obs status. ?Patient has all ordered DME at home already.  ?He is agreeable to [facility], prefers to check w [facility] first, and if they can't accept he does not have preference thereafter.  ?Referral pending to [facility], unable to take ?[facility] able to start Thursday/ Friday ?Checking with [facility] to see if they can take any earlier- they cannot. ?Will accept [facility]'s SOC late this week ? ? ? ?Final next level of care: [place] ?Barriers to Discharge: No Barriers Identified ? ? ?Patient Goals and CMS Choice ?Patient states their goals for this hospitalization and ongoing recovery are:: to go home ?CMS Medicare.gov Compare Post Acute Care list provided to:: Patient ?Choice offered to / list presented to : Patient ? ?Discharge Placement ?  ?           ?  ?  ?  ?  ? ?Discharge Plan and Services ?  ?  ?           ?DME Arranged: N/A ?  ?  ?  ?  ?HH Arranged: RN, PT, OT ?[facility] Agency: [place] ([place]) ?Date HH Agency Contacted: 02/16/22 ?Time [place]: 4580 ?Representative spoke with at [place]: Corene Cornea ? ?Social Determinants of Health (SDOH) Interventions ?  ? ? ?Readmission Risk Interventions ?   ? View : No data to display.  ?  ?  ?  ? ? ? ? ? ?

## 2022-02-19 DIAGNOSIS — M1712 Unilateral primary osteoarthritis, left knee: Secondary | ICD-10-CM | POA: Diagnosis not present

## 2022-02-19 DIAGNOSIS — M25562 Pain in left knee: Secondary | ICD-10-CM | POA: Diagnosis not present

## 2022-02-20 DIAGNOSIS — S7002XD Contusion of left hip, subsequent encounter: Secondary | ICD-10-CM | POA: Diagnosis not present

## 2022-02-20 DIAGNOSIS — R7303 Prediabetes: Secondary | ICD-10-CM | POA: Diagnosis not present

## 2022-02-21 DIAGNOSIS — H409 Unspecified glaucoma: Secondary | ICD-10-CM | POA: Diagnosis not present

## 2022-02-21 DIAGNOSIS — Z9181 History of falling: Secondary | ICD-10-CM | POA: Diagnosis not present

## 2022-02-21 DIAGNOSIS — E876 Hypokalemia: Secondary | ICD-10-CM | POA: Diagnosis not present

## 2022-02-21 DIAGNOSIS — I110 Hypertensive heart disease with heart failure: Secondary | ICD-10-CM | POA: Diagnosis not present

## 2022-02-21 DIAGNOSIS — K219 Gastro-esophageal reflux disease without esophagitis: Secondary | ICD-10-CM | POA: Diagnosis not present

## 2022-02-21 DIAGNOSIS — I739 Peripheral vascular disease, unspecified: Secondary | ICD-10-CM | POA: Diagnosis not present

## 2022-02-21 DIAGNOSIS — M1711 Unilateral primary osteoarthritis, right knee: Secondary | ICD-10-CM | POA: Diagnosis not present

## 2022-02-21 DIAGNOSIS — Z7902 Long term (current) use of antithrombotics/antiplatelets: Secondary | ICD-10-CM | POA: Diagnosis not present

## 2022-02-21 DIAGNOSIS — D539 Nutritional anemia, unspecified: Secondary | ICD-10-CM | POA: Diagnosis not present

## 2022-02-21 DIAGNOSIS — M069 Rheumatoid arthritis, unspecified: Secondary | ICD-10-CM | POA: Diagnosis not present

## 2022-02-21 DIAGNOSIS — G4700 Insomnia, unspecified: Secondary | ICD-10-CM | POA: Diagnosis not present

## 2022-02-21 DIAGNOSIS — H5462 Unqualified visual loss, left eye, normal vision right eye: Secondary | ICD-10-CM | POA: Diagnosis not present

## 2022-02-21 DIAGNOSIS — G8929 Other chronic pain: Secondary | ICD-10-CM | POA: Diagnosis not present

## 2022-02-21 DIAGNOSIS — S7002XD Contusion of left hip, subsequent encounter: Secondary | ICD-10-CM | POA: Diagnosis not present

## 2022-02-21 DIAGNOSIS — I5042 Chronic combined systolic (congestive) and diastolic (congestive) heart failure: Secondary | ICD-10-CM | POA: Diagnosis not present

## 2022-02-26 DIAGNOSIS — M25562 Pain in left knee: Secondary | ICD-10-CM | POA: Diagnosis not present

## 2022-02-26 DIAGNOSIS — M1712 Unilateral primary osteoarthritis, left knee: Secondary | ICD-10-CM | POA: Diagnosis not present

## 2022-02-28 DIAGNOSIS — I5042 Chronic combined systolic (congestive) and diastolic (congestive) heart failure: Secondary | ICD-10-CM | POA: Diagnosis not present

## 2022-02-28 DIAGNOSIS — E876 Hypokalemia: Secondary | ICD-10-CM | POA: Diagnosis not present

## 2022-02-28 DIAGNOSIS — M1711 Unilateral primary osteoarthritis, right knee: Secondary | ICD-10-CM | POA: Diagnosis not present

## 2022-02-28 DIAGNOSIS — H5462 Unqualified visual loss, left eye, normal vision right eye: Secondary | ICD-10-CM | POA: Diagnosis not present

## 2022-02-28 DIAGNOSIS — M069 Rheumatoid arthritis, unspecified: Secondary | ICD-10-CM | POA: Diagnosis not present

## 2022-02-28 DIAGNOSIS — Z9181 History of falling: Secondary | ICD-10-CM | POA: Diagnosis not present

## 2022-02-28 DIAGNOSIS — H409 Unspecified glaucoma: Secondary | ICD-10-CM | POA: Diagnosis not present

## 2022-02-28 DIAGNOSIS — G4700 Insomnia, unspecified: Secondary | ICD-10-CM | POA: Diagnosis not present

## 2022-02-28 DIAGNOSIS — I110 Hypertensive heart disease with heart failure: Secondary | ICD-10-CM | POA: Diagnosis not present

## 2022-02-28 DIAGNOSIS — K219 Gastro-esophageal reflux disease without esophagitis: Secondary | ICD-10-CM | POA: Diagnosis not present

## 2022-02-28 DIAGNOSIS — S7002XD Contusion of left hip, subsequent encounter: Secondary | ICD-10-CM | POA: Diagnosis not present

## 2022-02-28 DIAGNOSIS — G8929 Other chronic pain: Secondary | ICD-10-CM | POA: Diagnosis not present

## 2022-02-28 DIAGNOSIS — I739 Peripheral vascular disease, unspecified: Secondary | ICD-10-CM | POA: Diagnosis not present

## 2022-02-28 DIAGNOSIS — Z7902 Long term (current) use of antithrombotics/antiplatelets: Secondary | ICD-10-CM | POA: Diagnosis not present

## 2022-02-28 DIAGNOSIS — D539 Nutritional anemia, unspecified: Secondary | ICD-10-CM | POA: Diagnosis not present

## 2022-03-05 DIAGNOSIS — M170 Bilateral primary osteoarthritis of knee: Secondary | ICD-10-CM | POA: Diagnosis not present

## 2022-03-05 DIAGNOSIS — S7002XD Contusion of left hip, subsequent encounter: Secondary | ICD-10-CM | POA: Diagnosis not present

## 2022-03-05 DIAGNOSIS — M069 Rheumatoid arthritis, unspecified: Secondary | ICD-10-CM | POA: Diagnosis not present

## 2022-03-05 DIAGNOSIS — M25562 Pain in left knee: Secondary | ICD-10-CM | POA: Diagnosis not present

## 2022-03-05 DIAGNOSIS — G4700 Insomnia, unspecified: Secondary | ICD-10-CM | POA: Diagnosis not present

## 2022-03-05 DIAGNOSIS — H409 Unspecified glaucoma: Secondary | ICD-10-CM | POA: Diagnosis not present

## 2022-03-05 DIAGNOSIS — I739 Peripheral vascular disease, unspecified: Secondary | ICD-10-CM | POA: Diagnosis not present

## 2022-03-05 DIAGNOSIS — M1711 Unilateral primary osteoarthritis, right knee: Secondary | ICD-10-CM | POA: Diagnosis not present

## 2022-03-05 DIAGNOSIS — D539 Nutritional anemia, unspecified: Secondary | ICD-10-CM | POA: Diagnosis not present

## 2022-03-05 DIAGNOSIS — I110 Hypertensive heart disease with heart failure: Secondary | ICD-10-CM | POA: Diagnosis not present

## 2022-03-05 DIAGNOSIS — M1712 Unilateral primary osteoarthritis, left knee: Secondary | ICD-10-CM | POA: Diagnosis not present

## 2022-03-05 DIAGNOSIS — Z9181 History of falling: Secondary | ICD-10-CM | POA: Diagnosis not present

## 2022-03-05 DIAGNOSIS — Z7902 Long term (current) use of antithrombotics/antiplatelets: Secondary | ICD-10-CM | POA: Diagnosis not present

## 2022-03-05 DIAGNOSIS — K219 Gastro-esophageal reflux disease without esophagitis: Secondary | ICD-10-CM | POA: Diagnosis not present

## 2022-03-05 DIAGNOSIS — I5042 Chronic combined systolic (congestive) and diastolic (congestive) heart failure: Secondary | ICD-10-CM | POA: Diagnosis not present

## 2022-03-05 DIAGNOSIS — G8929 Other chronic pain: Secondary | ICD-10-CM | POA: Diagnosis not present

## 2022-03-05 DIAGNOSIS — H5462 Unqualified visual loss, left eye, normal vision right eye: Secondary | ICD-10-CM | POA: Diagnosis not present

## 2022-03-05 DIAGNOSIS — M48062 Spinal stenosis, lumbar region with neurogenic claudication: Secondary | ICD-10-CM | POA: Diagnosis not present

## 2022-03-05 DIAGNOSIS — E876 Hypokalemia: Secondary | ICD-10-CM | POA: Diagnosis not present

## 2022-03-07 DIAGNOSIS — G4700 Insomnia, unspecified: Secondary | ICD-10-CM | POA: Diagnosis not present

## 2022-03-07 DIAGNOSIS — M1711 Unilateral primary osteoarthritis, right knee: Secondary | ICD-10-CM | POA: Diagnosis not present

## 2022-03-07 DIAGNOSIS — Z9181 History of falling: Secondary | ICD-10-CM | POA: Diagnosis not present

## 2022-03-07 DIAGNOSIS — D539 Nutritional anemia, unspecified: Secondary | ICD-10-CM | POA: Diagnosis not present

## 2022-03-07 DIAGNOSIS — H5462 Unqualified visual loss, left eye, normal vision right eye: Secondary | ICD-10-CM | POA: Diagnosis not present

## 2022-03-07 DIAGNOSIS — M069 Rheumatoid arthritis, unspecified: Secondary | ICD-10-CM | POA: Diagnosis not present

## 2022-03-07 DIAGNOSIS — I110 Hypertensive heart disease with heart failure: Secondary | ICD-10-CM | POA: Diagnosis not present

## 2022-03-07 DIAGNOSIS — G8929 Other chronic pain: Secondary | ICD-10-CM | POA: Diagnosis not present

## 2022-03-07 DIAGNOSIS — H409 Unspecified glaucoma: Secondary | ICD-10-CM | POA: Diagnosis not present

## 2022-03-07 DIAGNOSIS — I739 Peripheral vascular disease, unspecified: Secondary | ICD-10-CM | POA: Diagnosis not present

## 2022-03-07 DIAGNOSIS — K219 Gastro-esophageal reflux disease without esophagitis: Secondary | ICD-10-CM | POA: Diagnosis not present

## 2022-03-07 DIAGNOSIS — Z7902 Long term (current) use of antithrombotics/antiplatelets: Secondary | ICD-10-CM | POA: Diagnosis not present

## 2022-03-07 DIAGNOSIS — S7002XD Contusion of left hip, subsequent encounter: Secondary | ICD-10-CM | POA: Diagnosis not present

## 2022-03-07 DIAGNOSIS — I5042 Chronic combined systolic (congestive) and diastolic (congestive) heart failure: Secondary | ICD-10-CM | POA: Diagnosis not present

## 2022-03-07 DIAGNOSIS — E876 Hypokalemia: Secondary | ICD-10-CM | POA: Diagnosis not present

## 2022-03-10 DIAGNOSIS — I739 Peripheral vascular disease, unspecified: Secondary | ICD-10-CM | POA: Diagnosis not present

## 2022-03-10 DIAGNOSIS — H5462 Unqualified visual loss, left eye, normal vision right eye: Secondary | ICD-10-CM | POA: Diagnosis not present

## 2022-03-10 DIAGNOSIS — M069 Rheumatoid arthritis, unspecified: Secondary | ICD-10-CM | POA: Diagnosis not present

## 2022-03-10 DIAGNOSIS — G4700 Insomnia, unspecified: Secondary | ICD-10-CM | POA: Diagnosis not present

## 2022-03-10 DIAGNOSIS — S7002XD Contusion of left hip, subsequent encounter: Secondary | ICD-10-CM | POA: Diagnosis not present

## 2022-03-10 DIAGNOSIS — D539 Nutritional anemia, unspecified: Secondary | ICD-10-CM | POA: Diagnosis not present

## 2022-03-10 DIAGNOSIS — E876 Hypokalemia: Secondary | ICD-10-CM | POA: Diagnosis not present

## 2022-03-10 DIAGNOSIS — K219 Gastro-esophageal reflux disease without esophagitis: Secondary | ICD-10-CM | POA: Diagnosis not present

## 2022-03-10 DIAGNOSIS — I5042 Chronic combined systolic (congestive) and diastolic (congestive) heart failure: Secondary | ICD-10-CM | POA: Diagnosis not present

## 2022-03-10 DIAGNOSIS — Z9181 History of falling: Secondary | ICD-10-CM | POA: Diagnosis not present

## 2022-03-10 DIAGNOSIS — H409 Unspecified glaucoma: Secondary | ICD-10-CM | POA: Diagnosis not present

## 2022-03-10 DIAGNOSIS — M1711 Unilateral primary osteoarthritis, right knee: Secondary | ICD-10-CM | POA: Diagnosis not present

## 2022-03-10 DIAGNOSIS — Z7902 Long term (current) use of antithrombotics/antiplatelets: Secondary | ICD-10-CM | POA: Diagnosis not present

## 2022-03-10 DIAGNOSIS — I110 Hypertensive heart disease with heart failure: Secondary | ICD-10-CM | POA: Diagnosis not present

## 2022-03-10 DIAGNOSIS — G8929 Other chronic pain: Secondary | ICD-10-CM | POA: Diagnosis not present

## 2022-03-11 DIAGNOSIS — Z7902 Long term (current) use of antithrombotics/antiplatelets: Secondary | ICD-10-CM | POA: Diagnosis not present

## 2022-03-11 DIAGNOSIS — I110 Hypertensive heart disease with heart failure: Secondary | ICD-10-CM | POA: Diagnosis not present

## 2022-03-11 DIAGNOSIS — D539 Nutritional anemia, unspecified: Secondary | ICD-10-CM | POA: Diagnosis not present

## 2022-03-11 DIAGNOSIS — S7002XD Contusion of left hip, subsequent encounter: Secondary | ICD-10-CM | POA: Diagnosis not present

## 2022-03-11 DIAGNOSIS — I5042 Chronic combined systolic (congestive) and diastolic (congestive) heart failure: Secondary | ICD-10-CM | POA: Diagnosis not present

## 2022-03-11 DIAGNOSIS — K219 Gastro-esophageal reflux disease without esophagitis: Secondary | ICD-10-CM | POA: Diagnosis not present

## 2022-03-11 DIAGNOSIS — G4700 Insomnia, unspecified: Secondary | ICD-10-CM | POA: Diagnosis not present

## 2022-03-11 DIAGNOSIS — I739 Peripheral vascular disease, unspecified: Secondary | ICD-10-CM | POA: Diagnosis not present

## 2022-03-11 DIAGNOSIS — E876 Hypokalemia: Secondary | ICD-10-CM | POA: Diagnosis not present

## 2022-03-11 DIAGNOSIS — H5462 Unqualified visual loss, left eye, normal vision right eye: Secondary | ICD-10-CM | POA: Diagnosis not present

## 2022-03-11 DIAGNOSIS — Z9181 History of falling: Secondary | ICD-10-CM | POA: Diagnosis not present

## 2022-03-11 DIAGNOSIS — G8929 Other chronic pain: Secondary | ICD-10-CM | POA: Diagnosis not present

## 2022-03-11 DIAGNOSIS — M069 Rheumatoid arthritis, unspecified: Secondary | ICD-10-CM | POA: Diagnosis not present

## 2022-03-11 DIAGNOSIS — M1711 Unilateral primary osteoarthritis, right knee: Secondary | ICD-10-CM | POA: Diagnosis not present

## 2022-03-11 DIAGNOSIS — H409 Unspecified glaucoma: Secondary | ICD-10-CM | POA: Diagnosis not present

## 2022-03-12 DIAGNOSIS — Z9181 History of falling: Secondary | ICD-10-CM | POA: Diagnosis not present

## 2022-03-12 DIAGNOSIS — I110 Hypertensive heart disease with heart failure: Secondary | ICD-10-CM | POA: Diagnosis not present

## 2022-03-12 DIAGNOSIS — D539 Nutritional anemia, unspecified: Secondary | ICD-10-CM | POA: Diagnosis not present

## 2022-03-12 DIAGNOSIS — I5042 Chronic combined systolic (congestive) and diastolic (congestive) heart failure: Secondary | ICD-10-CM | POA: Diagnosis not present

## 2022-03-12 DIAGNOSIS — H409 Unspecified glaucoma: Secondary | ICD-10-CM | POA: Diagnosis not present

## 2022-03-12 DIAGNOSIS — M1711 Unilateral primary osteoarthritis, right knee: Secondary | ICD-10-CM | POA: Diagnosis not present

## 2022-03-12 DIAGNOSIS — I739 Peripheral vascular disease, unspecified: Secondary | ICD-10-CM | POA: Diagnosis not present

## 2022-03-12 DIAGNOSIS — K219 Gastro-esophageal reflux disease without esophagitis: Secondary | ICD-10-CM | POA: Diagnosis not present

## 2022-03-12 DIAGNOSIS — Z7902 Long term (current) use of antithrombotics/antiplatelets: Secondary | ICD-10-CM | POA: Diagnosis not present

## 2022-03-12 DIAGNOSIS — G8929 Other chronic pain: Secondary | ICD-10-CM | POA: Diagnosis not present

## 2022-03-12 DIAGNOSIS — M1712 Unilateral primary osteoarthritis, left knee: Secondary | ICD-10-CM | POA: Diagnosis not present

## 2022-03-12 DIAGNOSIS — S7002XD Contusion of left hip, subsequent encounter: Secondary | ICD-10-CM | POA: Diagnosis not present

## 2022-03-12 DIAGNOSIS — M069 Rheumatoid arthritis, unspecified: Secondary | ICD-10-CM | POA: Diagnosis not present

## 2022-03-12 DIAGNOSIS — E876 Hypokalemia: Secondary | ICD-10-CM | POA: Diagnosis not present

## 2022-03-12 DIAGNOSIS — H5462 Unqualified visual loss, left eye, normal vision right eye: Secondary | ICD-10-CM | POA: Diagnosis not present

## 2022-03-12 DIAGNOSIS — G4700 Insomnia, unspecified: Secondary | ICD-10-CM | POA: Diagnosis not present

## 2022-03-13 DIAGNOSIS — I739 Peripheral vascular disease, unspecified: Secondary | ICD-10-CM | POA: Diagnosis not present

## 2022-03-13 DIAGNOSIS — G8929 Other chronic pain: Secondary | ICD-10-CM | POA: Diagnosis not present

## 2022-03-13 DIAGNOSIS — D539 Nutritional anemia, unspecified: Secondary | ICD-10-CM | POA: Diagnosis not present

## 2022-03-13 DIAGNOSIS — S7002XD Contusion of left hip, subsequent encounter: Secondary | ICD-10-CM | POA: Diagnosis not present

## 2022-03-13 DIAGNOSIS — H5462 Unqualified visual loss, left eye, normal vision right eye: Secondary | ICD-10-CM | POA: Diagnosis not present

## 2022-03-13 DIAGNOSIS — M1711 Unilateral primary osteoarthritis, right knee: Secondary | ICD-10-CM | POA: Diagnosis not present

## 2022-03-13 DIAGNOSIS — Z9181 History of falling: Secondary | ICD-10-CM | POA: Diagnosis not present

## 2022-03-13 DIAGNOSIS — Z7902 Long term (current) use of antithrombotics/antiplatelets: Secondary | ICD-10-CM | POA: Diagnosis not present

## 2022-03-13 DIAGNOSIS — I5042 Chronic combined systolic (congestive) and diastolic (congestive) heart failure: Secondary | ICD-10-CM | POA: Diagnosis not present

## 2022-03-13 DIAGNOSIS — I110 Hypertensive heart disease with heart failure: Secondary | ICD-10-CM | POA: Diagnosis not present

## 2022-03-13 DIAGNOSIS — H409 Unspecified glaucoma: Secondary | ICD-10-CM | POA: Diagnosis not present

## 2022-03-13 DIAGNOSIS — G4700 Insomnia, unspecified: Secondary | ICD-10-CM | POA: Diagnosis not present

## 2022-03-13 DIAGNOSIS — M069 Rheumatoid arthritis, unspecified: Secondary | ICD-10-CM | POA: Diagnosis not present

## 2022-03-13 DIAGNOSIS — E876 Hypokalemia: Secondary | ICD-10-CM | POA: Diagnosis not present

## 2022-03-13 DIAGNOSIS — K219 Gastro-esophageal reflux disease without esophagitis: Secondary | ICD-10-CM | POA: Diagnosis not present

## 2022-03-17 DIAGNOSIS — S7002XA Contusion of left hip, initial encounter: Secondary | ICD-10-CM | POA: Diagnosis not present

## 2022-03-17 DIAGNOSIS — D6869 Other thrombophilia: Secondary | ICD-10-CM | POA: Diagnosis not present

## 2022-03-18 DIAGNOSIS — I739 Peripheral vascular disease, unspecified: Secondary | ICD-10-CM | POA: Diagnosis not present

## 2022-03-18 DIAGNOSIS — D539 Nutritional anemia, unspecified: Secondary | ICD-10-CM | POA: Diagnosis not present

## 2022-03-18 DIAGNOSIS — I5042 Chronic combined systolic (congestive) and diastolic (congestive) heart failure: Secondary | ICD-10-CM | POA: Diagnosis not present

## 2022-03-18 DIAGNOSIS — M069 Rheumatoid arthritis, unspecified: Secondary | ICD-10-CM | POA: Diagnosis not present

## 2022-03-18 DIAGNOSIS — I110 Hypertensive heart disease with heart failure: Secondary | ICD-10-CM | POA: Diagnosis not present

## 2022-03-18 DIAGNOSIS — H409 Unspecified glaucoma: Secondary | ICD-10-CM | POA: Diagnosis not present

## 2022-03-18 DIAGNOSIS — G4700 Insomnia, unspecified: Secondary | ICD-10-CM | POA: Diagnosis not present

## 2022-03-18 DIAGNOSIS — H5462 Unqualified visual loss, left eye, normal vision right eye: Secondary | ICD-10-CM | POA: Diagnosis not present

## 2022-03-18 DIAGNOSIS — K219 Gastro-esophageal reflux disease without esophagitis: Secondary | ICD-10-CM | POA: Diagnosis not present

## 2022-03-18 DIAGNOSIS — M1711 Unilateral primary osteoarthritis, right knee: Secondary | ICD-10-CM | POA: Diagnosis not present

## 2022-03-18 DIAGNOSIS — G8929 Other chronic pain: Secondary | ICD-10-CM | POA: Diagnosis not present

## 2022-03-18 DIAGNOSIS — S7002XD Contusion of left hip, subsequent encounter: Secondary | ICD-10-CM | POA: Diagnosis not present

## 2022-03-18 DIAGNOSIS — E876 Hypokalemia: Secondary | ICD-10-CM | POA: Diagnosis not present

## 2022-03-18 DIAGNOSIS — Z9181 History of falling: Secondary | ICD-10-CM | POA: Diagnosis not present

## 2022-03-18 DIAGNOSIS — Z7902 Long term (current) use of antithrombotics/antiplatelets: Secondary | ICD-10-CM | POA: Diagnosis not present

## 2022-03-20 DIAGNOSIS — D539 Nutritional anemia, unspecified: Secondary | ICD-10-CM | POA: Diagnosis not present

## 2022-03-20 DIAGNOSIS — G8929 Other chronic pain: Secondary | ICD-10-CM | POA: Diagnosis not present

## 2022-03-20 DIAGNOSIS — S7002XD Contusion of left hip, subsequent encounter: Secondary | ICD-10-CM | POA: Diagnosis not present

## 2022-03-20 DIAGNOSIS — Z9181 History of falling: Secondary | ICD-10-CM | POA: Diagnosis not present

## 2022-03-20 DIAGNOSIS — K219 Gastro-esophageal reflux disease without esophagitis: Secondary | ICD-10-CM | POA: Diagnosis not present

## 2022-03-20 DIAGNOSIS — H5462 Unqualified visual loss, left eye, normal vision right eye: Secondary | ICD-10-CM | POA: Diagnosis not present

## 2022-03-20 DIAGNOSIS — M069 Rheumatoid arthritis, unspecified: Secondary | ICD-10-CM | POA: Diagnosis not present

## 2022-03-20 DIAGNOSIS — I110 Hypertensive heart disease with heart failure: Secondary | ICD-10-CM | POA: Diagnosis not present

## 2022-03-20 DIAGNOSIS — I739 Peripheral vascular disease, unspecified: Secondary | ICD-10-CM | POA: Diagnosis not present

## 2022-03-20 DIAGNOSIS — G4700 Insomnia, unspecified: Secondary | ICD-10-CM | POA: Diagnosis not present

## 2022-03-20 DIAGNOSIS — Z7902 Long term (current) use of antithrombotics/antiplatelets: Secondary | ICD-10-CM | POA: Diagnosis not present

## 2022-03-20 DIAGNOSIS — H409 Unspecified glaucoma: Secondary | ICD-10-CM | POA: Diagnosis not present

## 2022-03-20 DIAGNOSIS — E876 Hypokalemia: Secondary | ICD-10-CM | POA: Diagnosis not present

## 2022-03-20 DIAGNOSIS — I5042 Chronic combined systolic (congestive) and diastolic (congestive) heart failure: Secondary | ICD-10-CM | POA: Diagnosis not present

## 2022-03-20 DIAGNOSIS — M1711 Unilateral primary osteoarthritis, right knee: Secondary | ICD-10-CM | POA: Diagnosis not present

## 2022-03-21 DIAGNOSIS — M1711 Unilateral primary osteoarthritis, right knee: Secondary | ICD-10-CM | POA: Diagnosis not present

## 2022-03-21 DIAGNOSIS — S7002XD Contusion of left hip, subsequent encounter: Secondary | ICD-10-CM | POA: Diagnosis not present

## 2022-03-21 DIAGNOSIS — K219 Gastro-esophageal reflux disease without esophagitis: Secondary | ICD-10-CM | POA: Diagnosis not present

## 2022-03-21 DIAGNOSIS — H5462 Unqualified visual loss, left eye, normal vision right eye: Secondary | ICD-10-CM | POA: Diagnosis not present

## 2022-03-21 DIAGNOSIS — Z7902 Long term (current) use of antithrombotics/antiplatelets: Secondary | ICD-10-CM | POA: Diagnosis not present

## 2022-03-21 DIAGNOSIS — D539 Nutritional anemia, unspecified: Secondary | ICD-10-CM | POA: Diagnosis not present

## 2022-03-21 DIAGNOSIS — G8929 Other chronic pain: Secondary | ICD-10-CM | POA: Diagnosis not present

## 2022-03-21 DIAGNOSIS — M069 Rheumatoid arthritis, unspecified: Secondary | ICD-10-CM | POA: Diagnosis not present

## 2022-03-21 DIAGNOSIS — I739 Peripheral vascular disease, unspecified: Secondary | ICD-10-CM | POA: Diagnosis not present

## 2022-03-21 DIAGNOSIS — Z9181 History of falling: Secondary | ICD-10-CM | POA: Diagnosis not present

## 2022-03-21 DIAGNOSIS — I5042 Chronic combined systolic (congestive) and diastolic (congestive) heart failure: Secondary | ICD-10-CM | POA: Diagnosis not present

## 2022-03-21 DIAGNOSIS — H409 Unspecified glaucoma: Secondary | ICD-10-CM | POA: Diagnosis not present

## 2022-03-21 DIAGNOSIS — I110 Hypertensive heart disease with heart failure: Secondary | ICD-10-CM | POA: Diagnosis not present

## 2022-03-21 DIAGNOSIS — G4700 Insomnia, unspecified: Secondary | ICD-10-CM | POA: Diagnosis not present

## 2022-03-21 DIAGNOSIS — E876 Hypokalemia: Secondary | ICD-10-CM | POA: Diagnosis not present

## 2022-03-24 DIAGNOSIS — E876 Hypokalemia: Secondary | ICD-10-CM | POA: Diagnosis not present

## 2022-03-24 DIAGNOSIS — D539 Nutritional anemia, unspecified: Secondary | ICD-10-CM | POA: Diagnosis not present

## 2022-03-24 DIAGNOSIS — Z9181 History of falling: Secondary | ICD-10-CM | POA: Diagnosis not present

## 2022-03-24 DIAGNOSIS — G4700 Insomnia, unspecified: Secondary | ICD-10-CM | POA: Diagnosis not present

## 2022-03-24 DIAGNOSIS — M1711 Unilateral primary osteoarthritis, right knee: Secondary | ICD-10-CM | POA: Diagnosis not present

## 2022-03-24 DIAGNOSIS — I739 Peripheral vascular disease, unspecified: Secondary | ICD-10-CM | POA: Diagnosis not present

## 2022-03-24 DIAGNOSIS — H5462 Unqualified visual loss, left eye, normal vision right eye: Secondary | ICD-10-CM | POA: Diagnosis not present

## 2022-03-24 DIAGNOSIS — M069 Rheumatoid arthritis, unspecified: Secondary | ICD-10-CM | POA: Diagnosis not present

## 2022-03-24 DIAGNOSIS — H409 Unspecified glaucoma: Secondary | ICD-10-CM | POA: Diagnosis not present

## 2022-03-24 DIAGNOSIS — I110 Hypertensive heart disease with heart failure: Secondary | ICD-10-CM | POA: Diagnosis not present

## 2022-03-24 DIAGNOSIS — Z7902 Long term (current) use of antithrombotics/antiplatelets: Secondary | ICD-10-CM | POA: Diagnosis not present

## 2022-03-24 DIAGNOSIS — K219 Gastro-esophageal reflux disease without esophagitis: Secondary | ICD-10-CM | POA: Diagnosis not present

## 2022-03-24 DIAGNOSIS — S7002XD Contusion of left hip, subsequent encounter: Secondary | ICD-10-CM | POA: Diagnosis not present

## 2022-03-24 DIAGNOSIS — G8929 Other chronic pain: Secondary | ICD-10-CM | POA: Diagnosis not present

## 2022-03-24 DIAGNOSIS — I5042 Chronic combined systolic (congestive) and diastolic (congestive) heart failure: Secondary | ICD-10-CM | POA: Diagnosis not present

## 2022-03-25 DIAGNOSIS — G4700 Insomnia, unspecified: Secondary | ICD-10-CM | POA: Diagnosis not present

## 2022-03-25 DIAGNOSIS — Z7902 Long term (current) use of antithrombotics/antiplatelets: Secondary | ICD-10-CM | POA: Diagnosis not present

## 2022-03-25 DIAGNOSIS — I739 Peripheral vascular disease, unspecified: Secondary | ICD-10-CM | POA: Diagnosis not present

## 2022-03-25 DIAGNOSIS — G8929 Other chronic pain: Secondary | ICD-10-CM | POA: Diagnosis not present

## 2022-03-25 DIAGNOSIS — M069 Rheumatoid arthritis, unspecified: Secondary | ICD-10-CM | POA: Diagnosis not present

## 2022-03-25 DIAGNOSIS — D539 Nutritional anemia, unspecified: Secondary | ICD-10-CM | POA: Diagnosis not present

## 2022-03-25 DIAGNOSIS — I110 Hypertensive heart disease with heart failure: Secondary | ICD-10-CM | POA: Diagnosis not present

## 2022-03-25 DIAGNOSIS — S7002XD Contusion of left hip, subsequent encounter: Secondary | ICD-10-CM | POA: Diagnosis not present

## 2022-03-25 DIAGNOSIS — H5462 Unqualified visual loss, left eye, normal vision right eye: Secondary | ICD-10-CM | POA: Diagnosis not present

## 2022-03-25 DIAGNOSIS — I5042 Chronic combined systolic (congestive) and diastolic (congestive) heart failure: Secondary | ICD-10-CM | POA: Diagnosis not present

## 2022-03-25 DIAGNOSIS — K219 Gastro-esophageal reflux disease without esophagitis: Secondary | ICD-10-CM | POA: Diagnosis not present

## 2022-03-25 DIAGNOSIS — M1711 Unilateral primary osteoarthritis, right knee: Secondary | ICD-10-CM | POA: Diagnosis not present

## 2022-03-25 DIAGNOSIS — Z9181 History of falling: Secondary | ICD-10-CM | POA: Diagnosis not present

## 2022-03-25 DIAGNOSIS — E876 Hypokalemia: Secondary | ICD-10-CM | POA: Diagnosis not present

## 2022-03-25 DIAGNOSIS — H409 Unspecified glaucoma: Secondary | ICD-10-CM | POA: Diagnosis not present

## 2022-03-27 DIAGNOSIS — Z9181 History of falling: Secondary | ICD-10-CM | POA: Diagnosis not present

## 2022-03-27 DIAGNOSIS — D539 Nutritional anemia, unspecified: Secondary | ICD-10-CM | POA: Diagnosis not present

## 2022-03-27 DIAGNOSIS — M1711 Unilateral primary osteoarthritis, right knee: Secondary | ICD-10-CM | POA: Diagnosis not present

## 2022-03-27 DIAGNOSIS — Z7902 Long term (current) use of antithrombotics/antiplatelets: Secondary | ICD-10-CM | POA: Diagnosis not present

## 2022-03-27 DIAGNOSIS — I110 Hypertensive heart disease with heart failure: Secondary | ICD-10-CM | POA: Diagnosis not present

## 2022-03-27 DIAGNOSIS — E876 Hypokalemia: Secondary | ICD-10-CM | POA: Diagnosis not present

## 2022-03-27 DIAGNOSIS — M069 Rheumatoid arthritis, unspecified: Secondary | ICD-10-CM | POA: Diagnosis not present

## 2022-03-27 DIAGNOSIS — G8929 Other chronic pain: Secondary | ICD-10-CM | POA: Diagnosis not present

## 2022-03-27 DIAGNOSIS — K219 Gastro-esophageal reflux disease without esophagitis: Secondary | ICD-10-CM | POA: Diagnosis not present

## 2022-03-27 DIAGNOSIS — G4700 Insomnia, unspecified: Secondary | ICD-10-CM | POA: Diagnosis not present

## 2022-03-27 DIAGNOSIS — H5462 Unqualified visual loss, left eye, normal vision right eye: Secondary | ICD-10-CM | POA: Diagnosis not present

## 2022-03-27 DIAGNOSIS — I739 Peripheral vascular disease, unspecified: Secondary | ICD-10-CM | POA: Diagnosis not present

## 2022-03-27 DIAGNOSIS — H409 Unspecified glaucoma: Secondary | ICD-10-CM | POA: Diagnosis not present

## 2022-03-27 DIAGNOSIS — S7002XD Contusion of left hip, subsequent encounter: Secondary | ICD-10-CM | POA: Diagnosis not present

## 2022-03-27 DIAGNOSIS — I5042 Chronic combined systolic (congestive) and diastolic (congestive) heart failure: Secondary | ICD-10-CM | POA: Diagnosis not present

## 2022-04-01 DIAGNOSIS — G8929 Other chronic pain: Secondary | ICD-10-CM | POA: Diagnosis not present

## 2022-04-01 DIAGNOSIS — H409 Unspecified glaucoma: Secondary | ICD-10-CM | POA: Diagnosis not present

## 2022-04-01 DIAGNOSIS — I110 Hypertensive heart disease with heart failure: Secondary | ICD-10-CM | POA: Diagnosis not present

## 2022-04-01 DIAGNOSIS — D539 Nutritional anemia, unspecified: Secondary | ICD-10-CM | POA: Diagnosis not present

## 2022-04-01 DIAGNOSIS — I739 Peripheral vascular disease, unspecified: Secondary | ICD-10-CM | POA: Diagnosis not present

## 2022-04-01 DIAGNOSIS — M069 Rheumatoid arthritis, unspecified: Secondary | ICD-10-CM | POA: Diagnosis not present

## 2022-04-01 DIAGNOSIS — Z9181 History of falling: Secondary | ICD-10-CM | POA: Diagnosis not present

## 2022-04-01 DIAGNOSIS — Z7902 Long term (current) use of antithrombotics/antiplatelets: Secondary | ICD-10-CM | POA: Diagnosis not present

## 2022-04-01 DIAGNOSIS — S7002XD Contusion of left hip, subsequent encounter: Secondary | ICD-10-CM | POA: Diagnosis not present

## 2022-04-01 DIAGNOSIS — G4700 Insomnia, unspecified: Secondary | ICD-10-CM | POA: Diagnosis not present

## 2022-04-01 DIAGNOSIS — M1711 Unilateral primary osteoarthritis, right knee: Secondary | ICD-10-CM | POA: Diagnosis not present

## 2022-04-01 DIAGNOSIS — K219 Gastro-esophageal reflux disease without esophagitis: Secondary | ICD-10-CM | POA: Diagnosis not present

## 2022-04-01 DIAGNOSIS — E876 Hypokalemia: Secondary | ICD-10-CM | POA: Diagnosis not present

## 2022-04-01 DIAGNOSIS — H5462 Unqualified visual loss, left eye, normal vision right eye: Secondary | ICD-10-CM | POA: Diagnosis not present

## 2022-04-01 DIAGNOSIS — I5042 Chronic combined systolic (congestive) and diastolic (congestive) heart failure: Secondary | ICD-10-CM | POA: Diagnosis not present

## 2022-04-05 DIAGNOSIS — M48062 Spinal stenosis, lumbar region with neurogenic claudication: Secondary | ICD-10-CM | POA: Diagnosis not present

## 2022-04-05 DIAGNOSIS — M170 Bilateral primary osteoarthritis of knee: Secondary | ICD-10-CM | POA: Diagnosis not present

## 2022-04-08 DIAGNOSIS — M069 Rheumatoid arthritis, unspecified: Secondary | ICD-10-CM | POA: Diagnosis not present

## 2022-04-08 DIAGNOSIS — I110 Hypertensive heart disease with heart failure: Secondary | ICD-10-CM | POA: Diagnosis not present

## 2022-04-08 DIAGNOSIS — I5042 Chronic combined systolic (congestive) and diastolic (congestive) heart failure: Secondary | ICD-10-CM | POA: Diagnosis not present

## 2022-04-08 DIAGNOSIS — E876 Hypokalemia: Secondary | ICD-10-CM | POA: Diagnosis not present

## 2022-04-08 DIAGNOSIS — H5462 Unqualified visual loss, left eye, normal vision right eye: Secondary | ICD-10-CM | POA: Diagnosis not present

## 2022-04-08 DIAGNOSIS — Z7902 Long term (current) use of antithrombotics/antiplatelets: Secondary | ICD-10-CM | POA: Diagnosis not present

## 2022-04-08 DIAGNOSIS — H409 Unspecified glaucoma: Secondary | ICD-10-CM | POA: Diagnosis not present

## 2022-04-08 DIAGNOSIS — S7002XD Contusion of left hip, subsequent encounter: Secondary | ICD-10-CM | POA: Diagnosis not present

## 2022-04-08 DIAGNOSIS — D539 Nutritional anemia, unspecified: Secondary | ICD-10-CM | POA: Diagnosis not present

## 2022-04-08 DIAGNOSIS — M1711 Unilateral primary osteoarthritis, right knee: Secondary | ICD-10-CM | POA: Diagnosis not present

## 2022-04-08 DIAGNOSIS — G4700 Insomnia, unspecified: Secondary | ICD-10-CM | POA: Diagnosis not present

## 2022-04-08 DIAGNOSIS — K219 Gastro-esophageal reflux disease without esophagitis: Secondary | ICD-10-CM | POA: Diagnosis not present

## 2022-04-08 DIAGNOSIS — I739 Peripheral vascular disease, unspecified: Secondary | ICD-10-CM | POA: Diagnosis not present

## 2022-04-08 DIAGNOSIS — Z9181 History of falling: Secondary | ICD-10-CM | POA: Diagnosis not present

## 2022-04-08 DIAGNOSIS — G8929 Other chronic pain: Secondary | ICD-10-CM | POA: Diagnosis not present

## 2022-04-10 DIAGNOSIS — M1711 Unilateral primary osteoarthritis, right knee: Secondary | ICD-10-CM | POA: Diagnosis not present

## 2022-04-10 DIAGNOSIS — H409 Unspecified glaucoma: Secondary | ICD-10-CM | POA: Diagnosis not present

## 2022-04-10 DIAGNOSIS — G8929 Other chronic pain: Secondary | ICD-10-CM | POA: Diagnosis not present

## 2022-04-10 DIAGNOSIS — D539 Nutritional anemia, unspecified: Secondary | ICD-10-CM | POA: Diagnosis not present

## 2022-04-10 DIAGNOSIS — E876 Hypokalemia: Secondary | ICD-10-CM | POA: Diagnosis not present

## 2022-04-10 DIAGNOSIS — K219 Gastro-esophageal reflux disease without esophagitis: Secondary | ICD-10-CM | POA: Diagnosis not present

## 2022-04-10 DIAGNOSIS — G4700 Insomnia, unspecified: Secondary | ICD-10-CM | POA: Diagnosis not present

## 2022-04-10 DIAGNOSIS — H5462 Unqualified visual loss, left eye, normal vision right eye: Secondary | ICD-10-CM | POA: Diagnosis not present

## 2022-04-10 DIAGNOSIS — I5042 Chronic combined systolic (congestive) and diastolic (congestive) heart failure: Secondary | ICD-10-CM | POA: Diagnosis not present

## 2022-04-10 DIAGNOSIS — S7002XD Contusion of left hip, subsequent encounter: Secondary | ICD-10-CM | POA: Diagnosis not present

## 2022-04-10 DIAGNOSIS — M069 Rheumatoid arthritis, unspecified: Secondary | ICD-10-CM | POA: Diagnosis not present

## 2022-04-10 DIAGNOSIS — I739 Peripheral vascular disease, unspecified: Secondary | ICD-10-CM | POA: Diagnosis not present

## 2022-04-10 DIAGNOSIS — I110 Hypertensive heart disease with heart failure: Secondary | ICD-10-CM | POA: Diagnosis not present

## 2022-04-10 DIAGNOSIS — Z7902 Long term (current) use of antithrombotics/antiplatelets: Secondary | ICD-10-CM | POA: Diagnosis not present

## 2022-04-10 DIAGNOSIS — Z9181 History of falling: Secondary | ICD-10-CM | POA: Diagnosis not present

## 2022-04-17 DIAGNOSIS — G4700 Insomnia, unspecified: Secondary | ICD-10-CM | POA: Diagnosis not present

## 2022-04-17 DIAGNOSIS — S7002XA Contusion of left hip, initial encounter: Secondary | ICD-10-CM | POA: Diagnosis not present

## 2022-04-17 DIAGNOSIS — S7002XD Contusion of left hip, subsequent encounter: Secondary | ICD-10-CM | POA: Diagnosis not present

## 2022-04-17 DIAGNOSIS — K219 Gastro-esophageal reflux disease without esophagitis: Secondary | ICD-10-CM | POA: Diagnosis not present

## 2022-04-17 DIAGNOSIS — D539 Nutritional anemia, unspecified: Secondary | ICD-10-CM | POA: Diagnosis not present

## 2022-04-17 DIAGNOSIS — M069 Rheumatoid arthritis, unspecified: Secondary | ICD-10-CM | POA: Diagnosis not present

## 2022-04-17 DIAGNOSIS — E876 Hypokalemia: Secondary | ICD-10-CM | POA: Diagnosis not present

## 2022-04-17 DIAGNOSIS — H409 Unspecified glaucoma: Secondary | ICD-10-CM | POA: Diagnosis not present

## 2022-04-17 DIAGNOSIS — Z9181 History of falling: Secondary | ICD-10-CM | POA: Diagnosis not present

## 2022-04-17 DIAGNOSIS — I5042 Chronic combined systolic (congestive) and diastolic (congestive) heart failure: Secondary | ICD-10-CM | POA: Diagnosis not present

## 2022-04-17 DIAGNOSIS — M1711 Unilateral primary osteoarthritis, right knee: Secondary | ICD-10-CM | POA: Diagnosis not present

## 2022-04-17 DIAGNOSIS — D6869 Other thrombophilia: Secondary | ICD-10-CM | POA: Diagnosis not present

## 2022-04-17 DIAGNOSIS — Z7902 Long term (current) use of antithrombotics/antiplatelets: Secondary | ICD-10-CM | POA: Diagnosis not present

## 2022-04-17 DIAGNOSIS — H5462 Unqualified visual loss, left eye, normal vision right eye: Secondary | ICD-10-CM | POA: Diagnosis not present

## 2022-04-17 DIAGNOSIS — I739 Peripheral vascular disease, unspecified: Secondary | ICD-10-CM | POA: Diagnosis not present

## 2022-04-17 DIAGNOSIS — I110 Hypertensive heart disease with heart failure: Secondary | ICD-10-CM | POA: Diagnosis not present

## 2022-04-17 DIAGNOSIS — G8929 Other chronic pain: Secondary | ICD-10-CM | POA: Diagnosis not present

## 2022-04-23 DIAGNOSIS — B351 Tinea unguium: Secondary | ICD-10-CM | POA: Diagnosis not present

## 2022-04-23 DIAGNOSIS — L84 Corns and callosities: Secondary | ICD-10-CM | POA: Diagnosis not present

## 2022-04-23 DIAGNOSIS — I739 Peripheral vascular disease, unspecified: Secondary | ICD-10-CM | POA: Diagnosis not present

## 2022-04-25 DIAGNOSIS — H401112 Primary open-angle glaucoma, right eye, moderate stage: Secondary | ICD-10-CM | POA: Diagnosis not present

## 2022-04-25 DIAGNOSIS — H401123 Primary open-angle glaucoma, left eye, severe stage: Secondary | ICD-10-CM | POA: Diagnosis not present

## 2022-04-30 DIAGNOSIS — M2550 Pain in unspecified joint: Secondary | ICD-10-CM | POA: Diagnosis not present

## 2022-04-30 DIAGNOSIS — M1991 Primary osteoarthritis, unspecified site: Secondary | ICD-10-CM | POA: Diagnosis not present

## 2022-04-30 DIAGNOSIS — M0579 Rheumatoid arthritis with rheumatoid factor of multiple sites without organ or systems involvement: Secondary | ICD-10-CM | POA: Diagnosis not present

## 2022-04-30 DIAGNOSIS — R5382 Chronic fatigue, unspecified: Secondary | ICD-10-CM | POA: Diagnosis not present

## 2022-04-30 DIAGNOSIS — Z79899 Other long term (current) drug therapy: Secondary | ICD-10-CM | POA: Diagnosis not present

## 2022-04-30 DIAGNOSIS — Z6822 Body mass index (BMI) 22.0-22.9, adult: Secondary | ICD-10-CM | POA: Diagnosis not present

## 2022-05-05 DIAGNOSIS — M170 Bilateral primary osteoarthritis of knee: Secondary | ICD-10-CM | POA: Diagnosis not present

## 2022-05-05 DIAGNOSIS — M48062 Spinal stenosis, lumbar region with neurogenic claudication: Secondary | ICD-10-CM | POA: Diagnosis not present

## 2022-05-17 DIAGNOSIS — D6869 Other thrombophilia: Secondary | ICD-10-CM | POA: Diagnosis not present

## 2022-05-17 DIAGNOSIS — S7002XA Contusion of left hip, initial encounter: Secondary | ICD-10-CM | POA: Diagnosis not present

## 2022-05-22 DIAGNOSIS — S7002XD Contusion of left hip, subsequent encounter: Secondary | ICD-10-CM | POA: Diagnosis not present

## 2022-06-05 DIAGNOSIS — M170 Bilateral primary osteoarthritis of knee: Secondary | ICD-10-CM | POA: Diagnosis not present

## 2022-06-05 DIAGNOSIS — M48062 Spinal stenosis, lumbar region with neurogenic claudication: Secondary | ICD-10-CM | POA: Diagnosis not present

## 2022-06-09 DIAGNOSIS — R3915 Urgency of urination: Secondary | ICD-10-CM | POA: Diagnosis not present

## 2022-06-09 DIAGNOSIS — N481 Balanitis: Secondary | ICD-10-CM | POA: Diagnosis not present

## 2022-06-17 DIAGNOSIS — D6869 Other thrombophilia: Secondary | ICD-10-CM | POA: Diagnosis not present

## 2022-06-17 DIAGNOSIS — S7002XA Contusion of left hip, initial encounter: Secondary | ICD-10-CM | POA: Diagnosis not present

## 2022-06-19 DIAGNOSIS — M25562 Pain in left knee: Secondary | ICD-10-CM | POA: Diagnosis not present

## 2022-06-19 DIAGNOSIS — M1712 Unilateral primary osteoarthritis, left knee: Secondary | ICD-10-CM | POA: Diagnosis not present

## 2022-06-19 DIAGNOSIS — M25462 Effusion, left knee: Secondary | ICD-10-CM | POA: Diagnosis not present

## 2022-06-26 ENCOUNTER — Other Ambulatory Visit (HOSPITAL_COMMUNITY): Payer: Self-pay

## 2022-07-06 DIAGNOSIS — M48062 Spinal stenosis, lumbar region with neurogenic claudication: Secondary | ICD-10-CM | POA: Diagnosis not present

## 2022-07-06 DIAGNOSIS — M170 Bilateral primary osteoarthritis of knee: Secondary | ICD-10-CM | POA: Diagnosis not present

## 2022-07-09 IMAGING — CT CT HEAD W/O CM
3 series · 15 of 47 positions shown, 18 images · non-contrast
Comparison: 04/17/2018

CLINICAL DATA: Fell, facial and head trauma



[Series 2: head 5.0 h30s · axial · 0.43mm/px · z∈[-99,+41]mm · 9 of 34 slices shown, 12 images]
[im 3/34  brain]
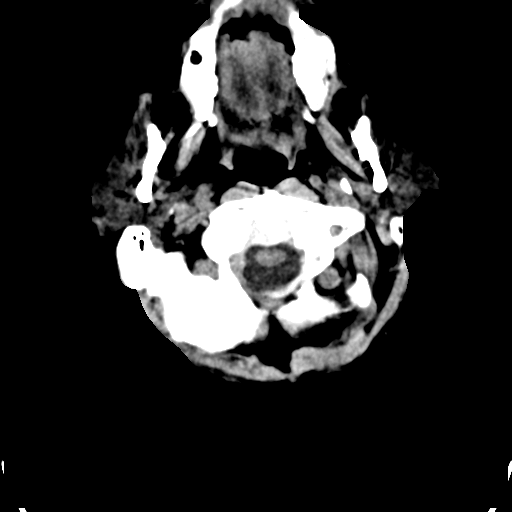
[im 3/34  bone]
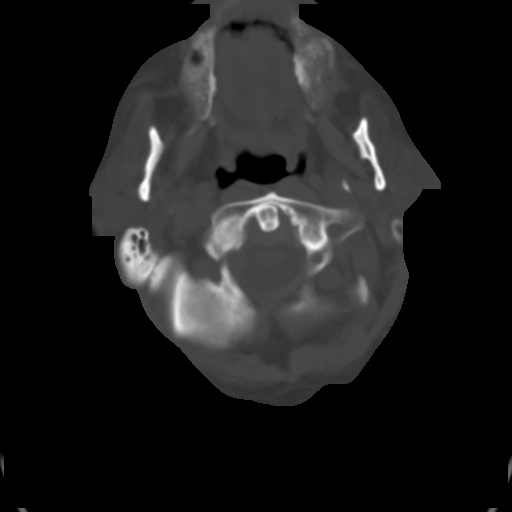
[im 6/34  brain]
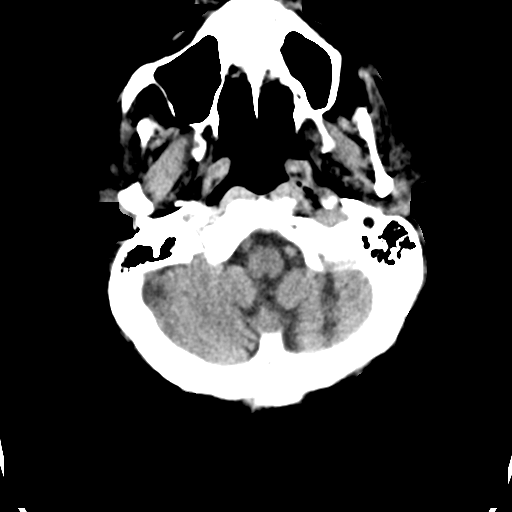
[im 10/34  brain]
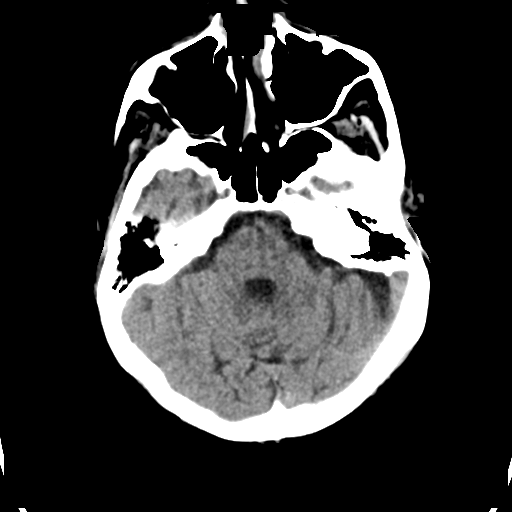
[im 13/34  brain]
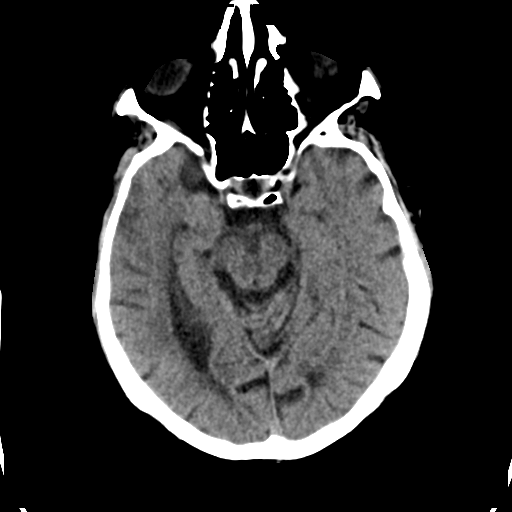
[im 18/34  brain]
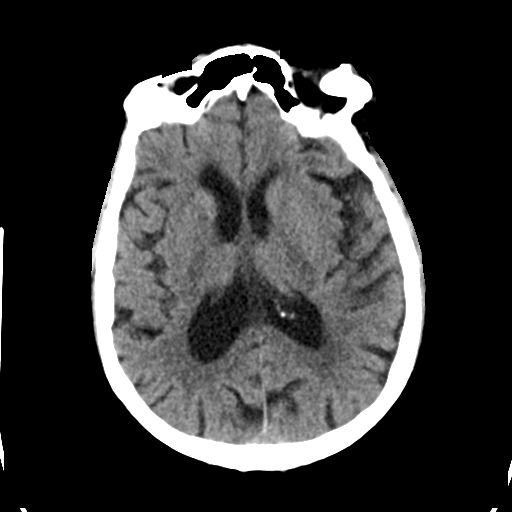
[im 18/34  bone]
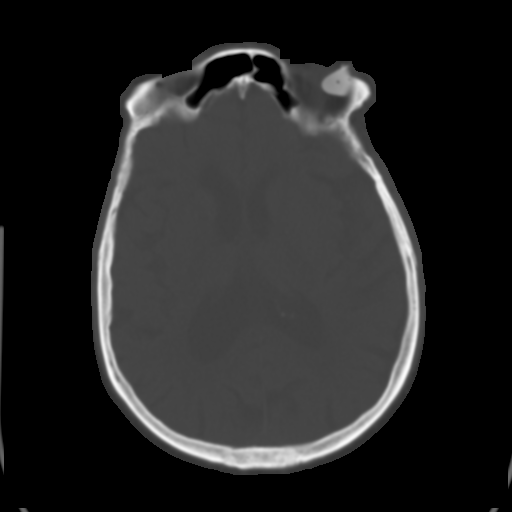
[im 21/34  brain]
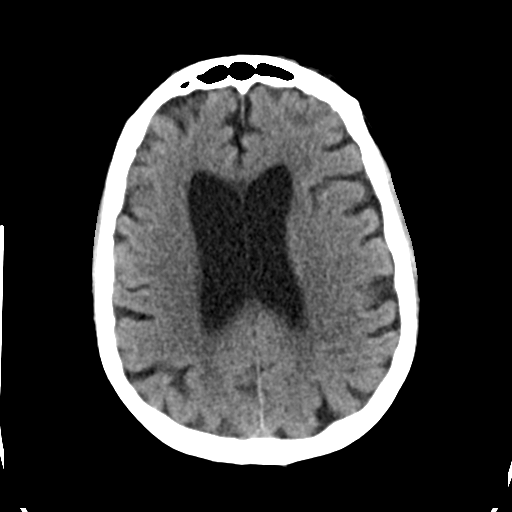
[im 24/34  brain]
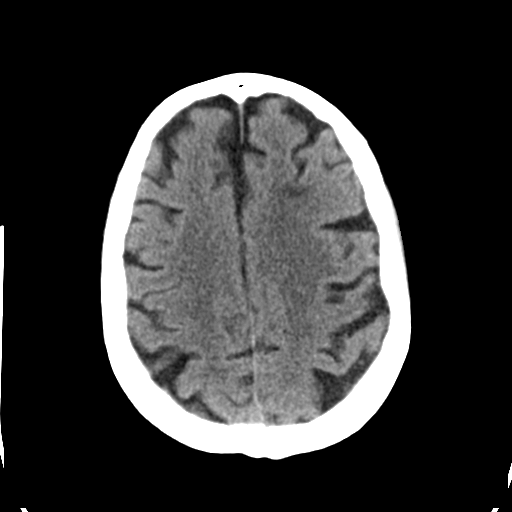
[im 28/34  brain]
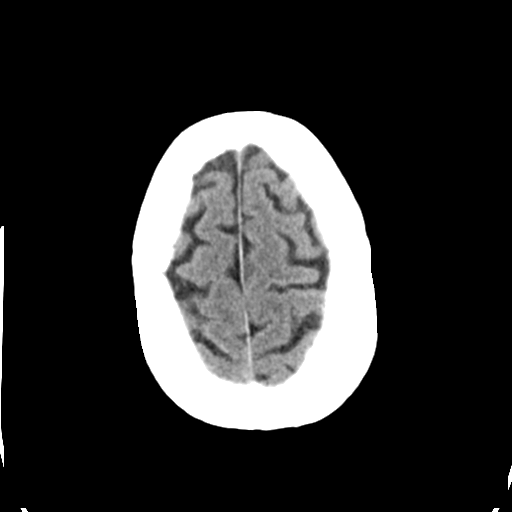
[im 31/34  brain]
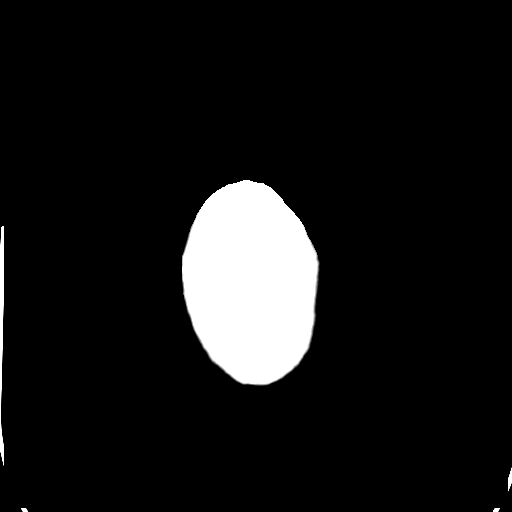
[im 31/34  bone]
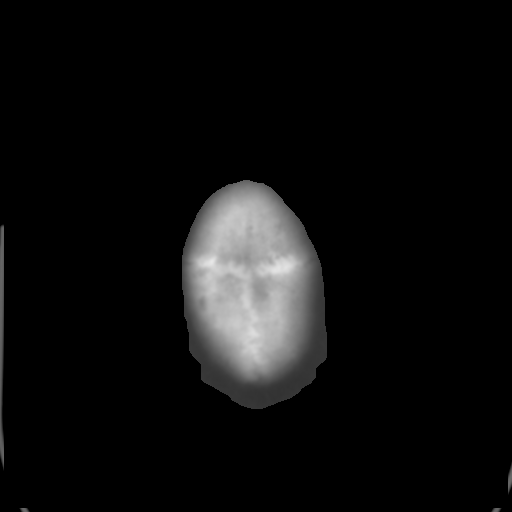

[Series 4: head 3.0 mpr cor · coronal · 0.32mm/px · 3 of 70 slices shown]
[im 24/70  brain]
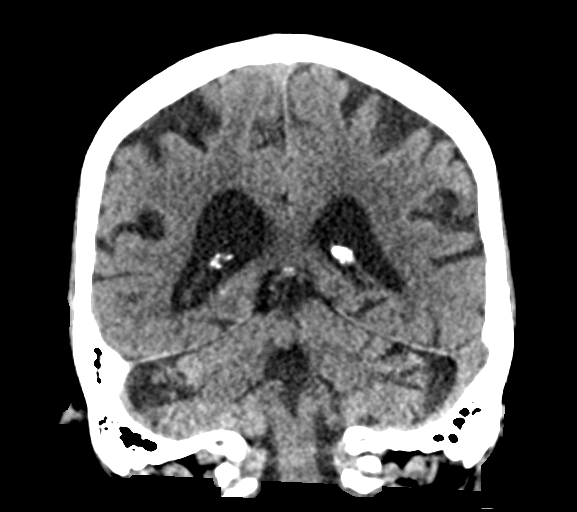
[im 31/70  brain]
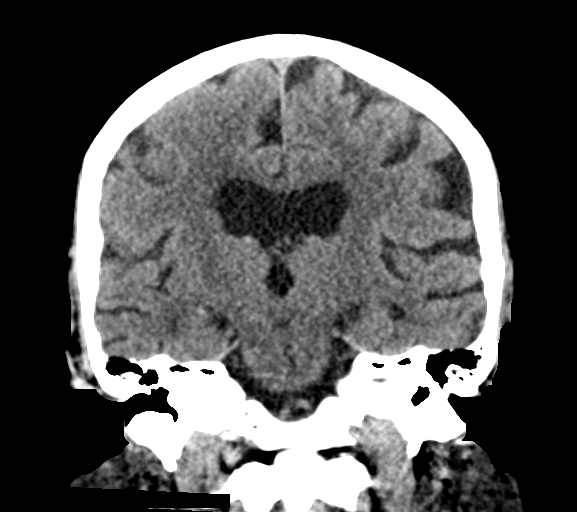
[im 39/70  brain]
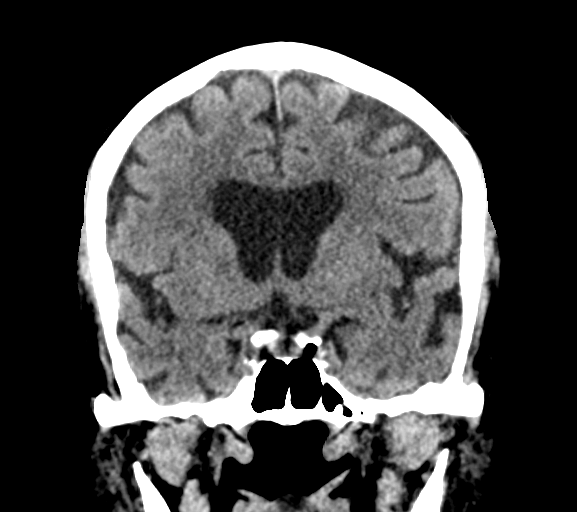

[Series 5: head 3.0 mpr sag · sagittal · 0.32mm/px · 3 of 59 slices shown]
[im 20/59  brain]
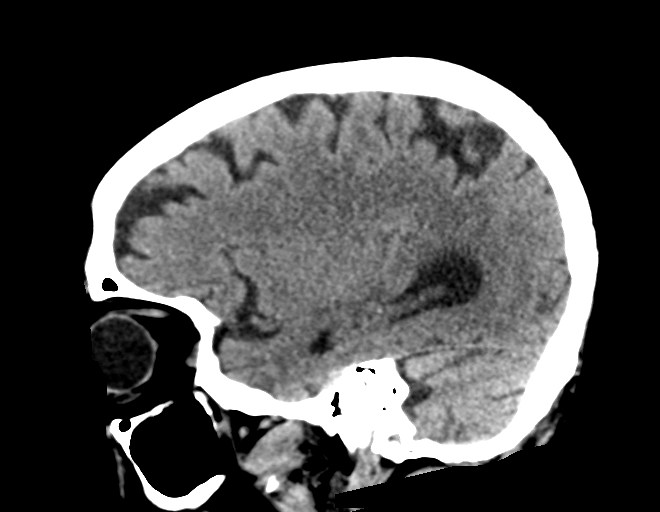
[im 30/59  brain]
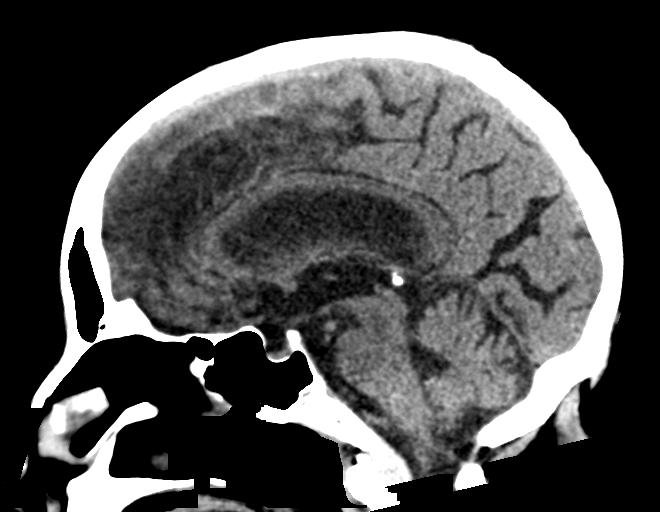
[im 39/59  brain]
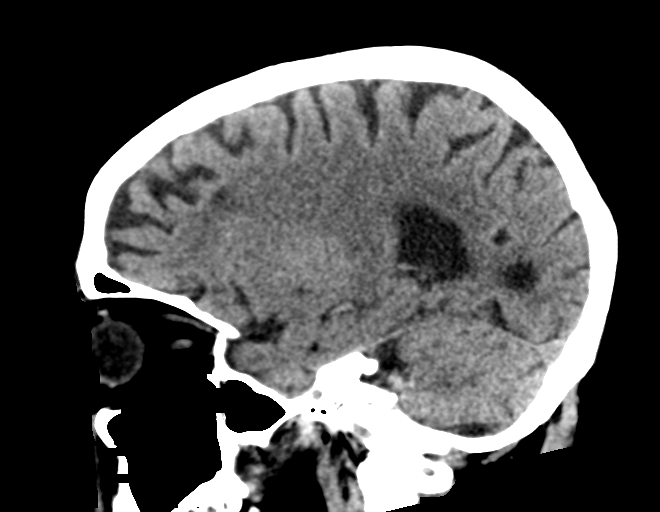

[15 of 47 positions shown; findings below may reference images not displayed]

FINDINGS: Brain: No acute infarct or hemorrhage. Chronic infarct left
cerebellar hemisphere again noted. Lateral ventricles and midline
structures are unremarkable. No acute extra-axial fluid collections.
No mass effect.

Vascular: No hyperdense vessel or unexpected calcification.

Skull: Normal. Negative for fracture or focal lesion.

Sinuses/Orbits: No acute finding. Stable postsurgical changes left
globe.

Other: None.
IMPRESSION: 1. Stable head CT, no acute intracranial process.

## 2022-07-10 IMAGING — CT CT HIP*L* WO/W CM
2 of 3 series · 15 of 46 positions shown, 17 images · non-contrast
Comparison: 02/14/2022

CLINICAL DATA: Left hip hematoma following fall.

EXAM:
CT OF THE LOWER LEFT EXTREMITY WITHOUT CONTRAST
TECHNIQUE: Multidetector CT imaging of the lower left extremity was performed
following the standard protocol before and during bolus
administration of intravenous contrast.

[Series 5: soft tissue (person_name) · axial · 0.60mm/px · z∈[+633,+1013]mm · 12 of 220 slices shown, 14 images]
[im 15/220  soft-tissue]
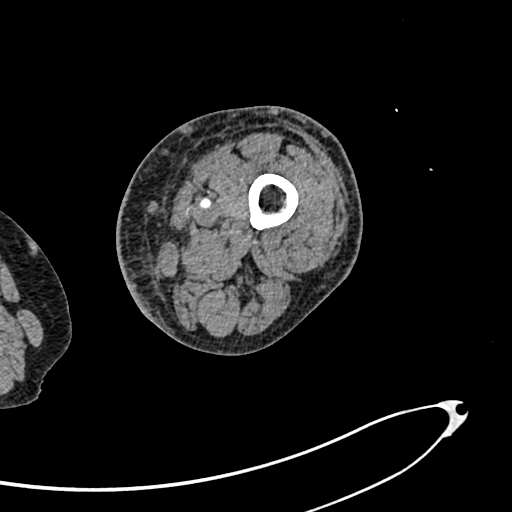
[im 15/220  bone]
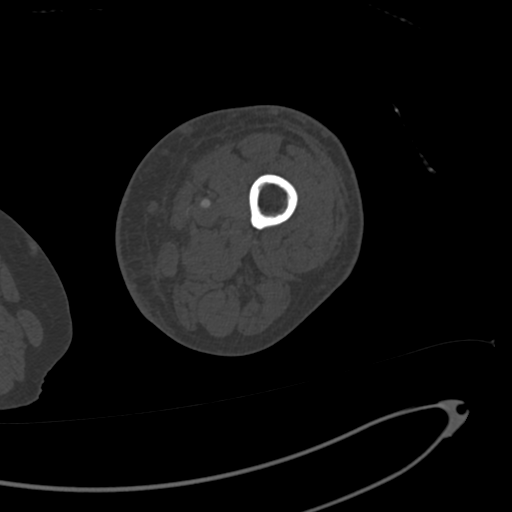
[im 29/220  soft-tissue]
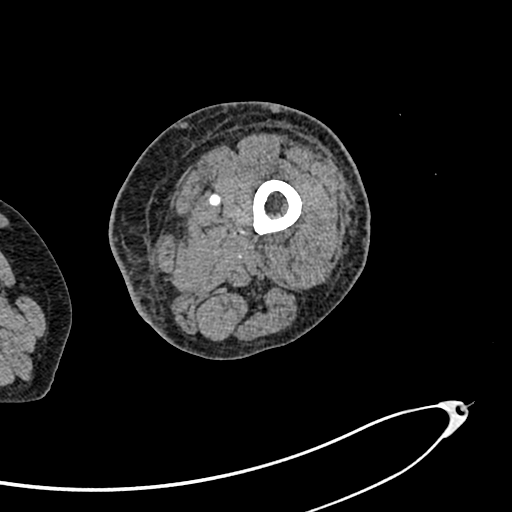
[im 50/220  soft-tissue]
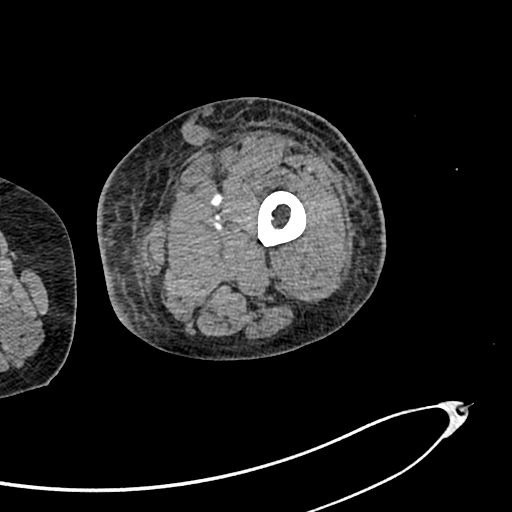
[im 64/220  soft-tissue]
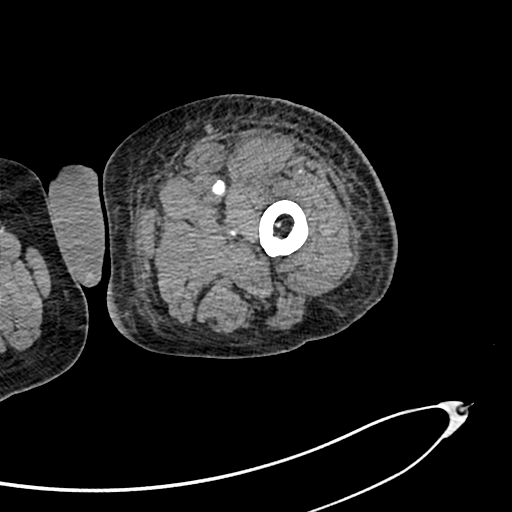
[im 85/220  soft-tissue]
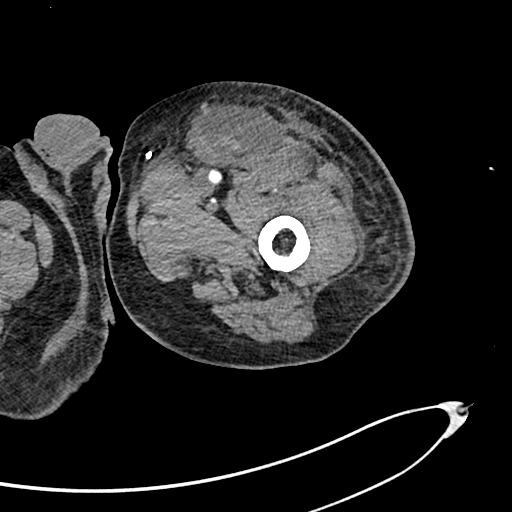
[im 99/220  soft-tissue]
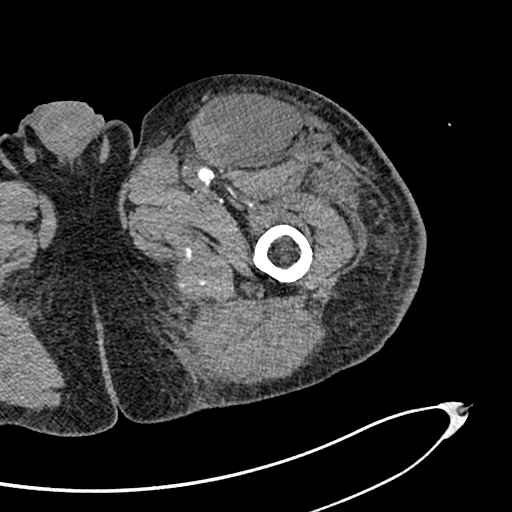
[im 121/220  soft-tissue]
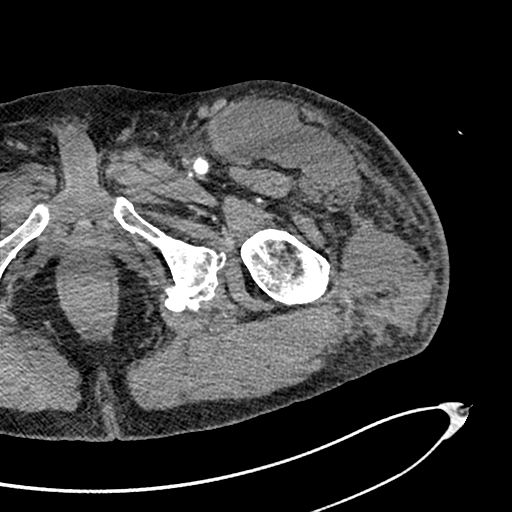
[im 135/220  soft-tissue]
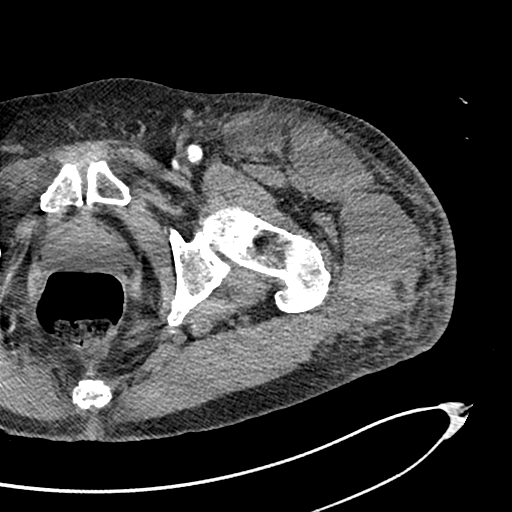
[im 156/220  soft-tissue]
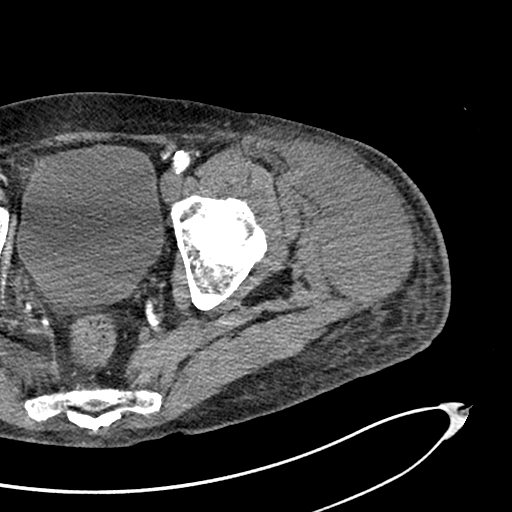
[im 156/220  bone]
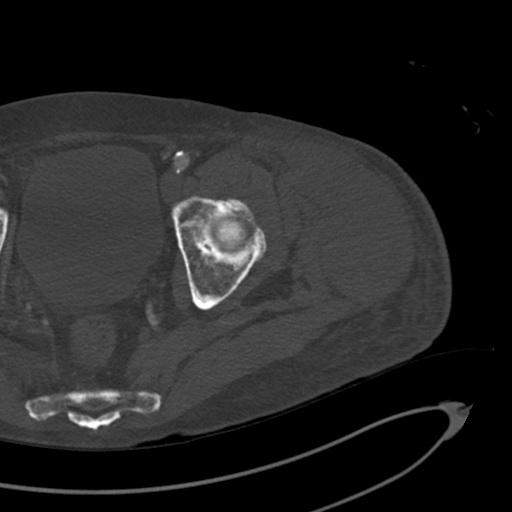
[im 170/220  soft-tissue]
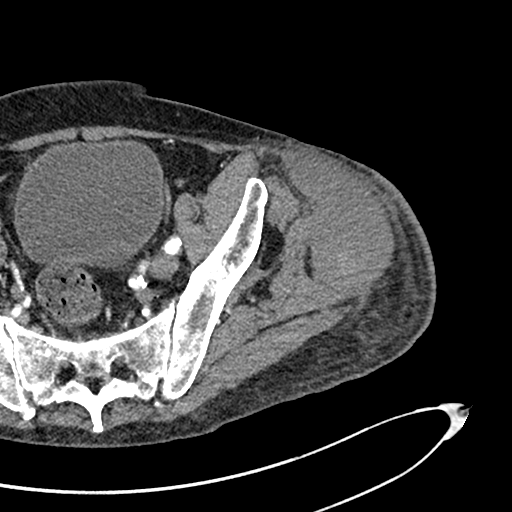
[im 191/220  soft-tissue]
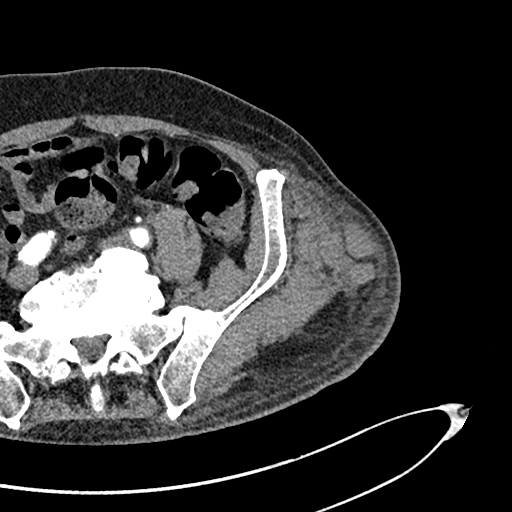
[im 205/220  soft-tissue]
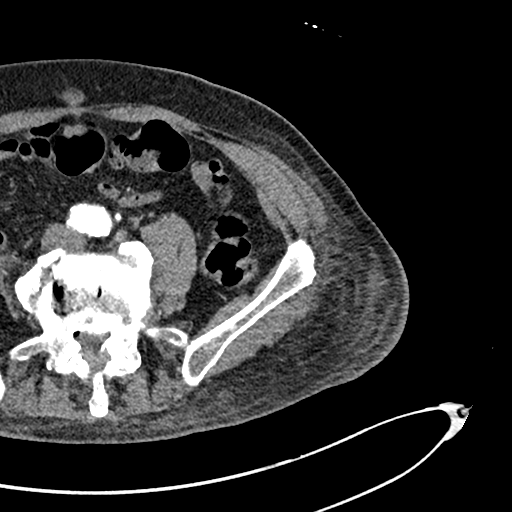

[Series 6: cor soft · coronal · 0.55mm/px · 3 of 121 slices shown]
[im 41/121  soft-tissue]
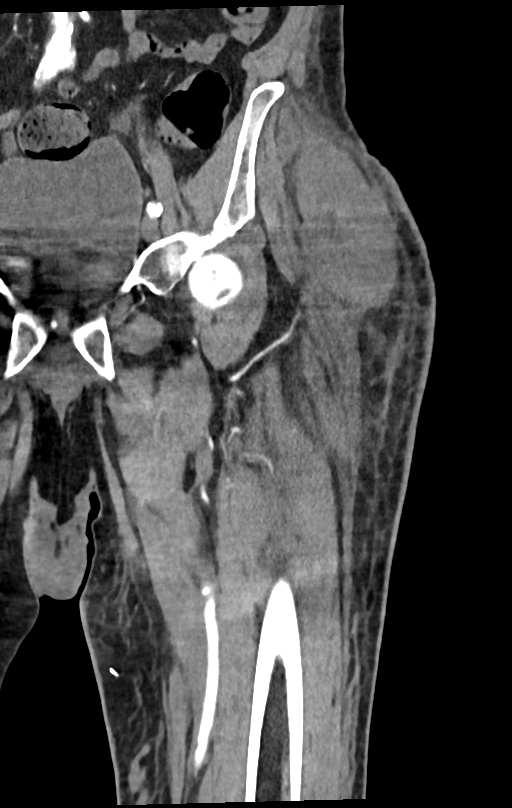
[im 54/121  soft-tissue]
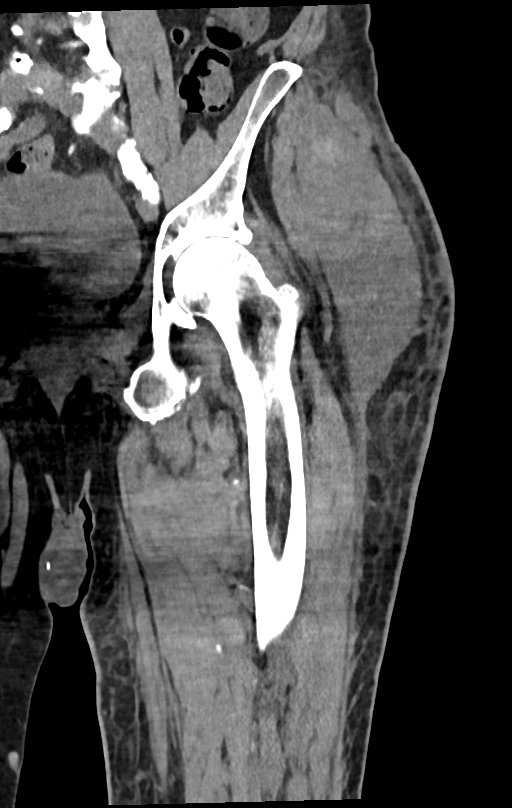
[im 67/121  soft-tissue]
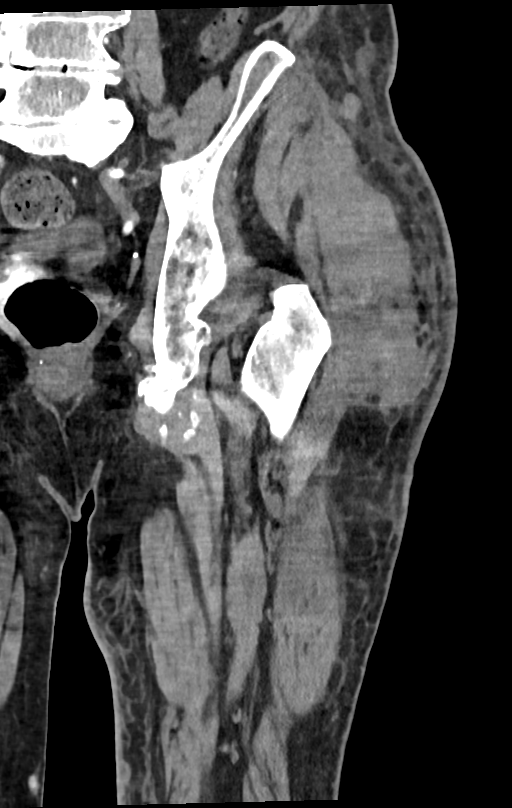

[15 of 46 positions shown; findings below may reference images not displayed]

RADIATION DOSE REDUCTION: This exam was performed according to the
departmental dose-optimization program which includes automated
exposure control, adjustment of the mA and/or kV according to
patient size and/or use of iterative reconstruction technique.

CONTRAST:  100mL OMNIPAQUE IOHEXOL 300 MG/ML  SOLN
FINDINGS: Bones/Joint/Cartilage

No acute fracture or dislocation. Moderate joint space narrowing,
subchondral sclerosis and subchondral cysts are noted at the left
hip. Degenerative changes are noted in the lower lumbar spine and
sacroiliac joint on the left

Ligaments

Suboptimally assessed by CT.

Muscles and Tendons

No intramuscular hematoma is identified.

Soft tissues

Subcutaneous fat stranding is noted over the left hip. Multiple
hematomas are noted along the lateral and anterior aspect of the
left hip with mixed attenuation. The largest component measures
x 5.0 x 10.0 cm. No contrast extravasation is identified to suggest
active hemorrhage. There is atherosclerotic calcification of the
aorta, iliac arteries, and femoral arteries without evidence of
significant stenosis. Multiple venous varices are noted in the
subcutaneous soft tissues.
IMPRESSION: 1. Multiple hematomas over the anterolateral aspect of the left hip
with mixed attenuation, the largest measuring 16.2 x 5.0 x 10.0 cm.
No contrast extravasation is identified to suggest active
hemorrhage.
2. Arterial vascular calcifications in the pelvis and left lower
extremity without evidence of significant stenosis. Multiple venous
varices are identified in the subcutaneous soft tissues.
3. No acute fracture or dislocation. Moderate degenerative changes
at the left hip.

## 2022-07-18 DIAGNOSIS — D6869 Other thrombophilia: Secondary | ICD-10-CM | POA: Diagnosis not present

## 2022-07-18 DIAGNOSIS — S7002XA Contusion of left hip, initial encounter: Secondary | ICD-10-CM | POA: Diagnosis not present

## 2022-07-24 DIAGNOSIS — M79671 Pain in right foot: Secondary | ICD-10-CM | POA: Diagnosis not present

## 2022-07-24 DIAGNOSIS — B351 Tinea unguium: Secondary | ICD-10-CM | POA: Diagnosis not present

## 2022-07-29 ENCOUNTER — Other Ambulatory Visit: Payer: Self-pay | Admitting: Internal Medicine

## 2022-07-31 ENCOUNTER — Ambulatory Visit
Admission: RE | Admit: 2022-07-31 | Discharge: 2022-07-31 | Disposition: A | Discharge status: Home / Self Care | Payer: Medicare Other | Source: Ambulatory Visit | Attending: Internal Medicine | Admitting: Internal Medicine

## 2022-07-31 ENCOUNTER — Other Ambulatory Visit: Payer: Self-pay | Admitting: Internal Medicine

## 2022-07-31 DIAGNOSIS — M1712 Unilateral primary osteoarthritis, left knee: Secondary | ICD-10-CM

## 2022-07-31 DIAGNOSIS — M25462 Effusion, left knee: Secondary | ICD-10-CM | POA: Diagnosis not present

## 2022-08-05 DIAGNOSIS — M0579 Rheumatoid arthritis with rheumatoid factor of multiple sites without organ or systems involvement: Secondary | ICD-10-CM | POA: Diagnosis not present

## 2022-08-05 DIAGNOSIS — M2550 Pain in unspecified joint: Secondary | ICD-10-CM | POA: Diagnosis not present

## 2022-08-05 DIAGNOSIS — R5382 Chronic fatigue, unspecified: Secondary | ICD-10-CM | POA: Diagnosis not present

## 2022-08-05 DIAGNOSIS — M170 Bilateral primary osteoarthritis of knee: Secondary | ICD-10-CM | POA: Diagnosis not present

## 2022-08-05 DIAGNOSIS — M48062 Spinal stenosis, lumbar region with neurogenic claudication: Secondary | ICD-10-CM | POA: Diagnosis not present

## 2022-08-05 DIAGNOSIS — Z6822 Body mass index (BMI) 22.0-22.9, adult: Secondary | ICD-10-CM | POA: Diagnosis not present

## 2022-08-05 DIAGNOSIS — M1991 Primary osteoarthritis, unspecified site: Secondary | ICD-10-CM | POA: Diagnosis not present

## 2022-08-05 DIAGNOSIS — Z79899 Other long term (current) drug therapy: Secondary | ICD-10-CM | POA: Diagnosis not present

## 2022-08-17 DIAGNOSIS — S7002XA Contusion of left hip, initial encounter: Secondary | ICD-10-CM | POA: Diagnosis not present

## 2022-08-17 DIAGNOSIS — D6869 Other thrombophilia: Secondary | ICD-10-CM | POA: Diagnosis not present

## 2022-09-01 DIAGNOSIS — M47816 Spondylosis without myelopathy or radiculopathy, lumbar region: Secondary | ICD-10-CM | POA: Diagnosis not present

## 2022-09-01 DIAGNOSIS — M0589 Other rheumatoid arthritis with rheumatoid factor of multiple sites: Secondary | ICD-10-CM | POA: Diagnosis not present

## 2022-09-01 DIAGNOSIS — Z79891 Long term (current) use of opiate analgesic: Secondary | ICD-10-CM | POA: Diagnosis not present

## 2022-09-01 DIAGNOSIS — G5621 Lesion of ulnar nerve, right upper limb: Secondary | ICD-10-CM | POA: Diagnosis not present

## 2022-09-01 DIAGNOSIS — G894 Chronic pain syndrome: Secondary | ICD-10-CM | POA: Diagnosis not present

## 2022-09-05 DIAGNOSIS — M48062 Spinal stenosis, lumbar region with neurogenic claudication: Secondary | ICD-10-CM | POA: Diagnosis not present

## 2022-09-05 DIAGNOSIS — M170 Bilateral primary osteoarthritis of knee: Secondary | ICD-10-CM | POA: Diagnosis not present

## 2022-09-17 DIAGNOSIS — S7002XA Contusion of left hip, initial encounter: Secondary | ICD-10-CM | POA: Diagnosis not present

## 2022-09-17 DIAGNOSIS — D6869 Other thrombophilia: Secondary | ICD-10-CM | POA: Diagnosis not present

## 2022-09-23 DIAGNOSIS — M25462 Effusion, left knee: Secondary | ICD-10-CM | POA: Diagnosis not present

## 2022-09-23 DIAGNOSIS — M1712 Unilateral primary osteoarthritis, left knee: Secondary | ICD-10-CM | POA: Diagnosis not present

## 2022-09-23 DIAGNOSIS — M25562 Pain in left knee: Secondary | ICD-10-CM | POA: Diagnosis not present

## 2022-09-26 ENCOUNTER — Emergency Department (HOSPITAL_COMMUNITY): Payer: Medicare Other

## 2022-09-26 ENCOUNTER — Other Ambulatory Visit: Payer: Self-pay

## 2022-09-26 ENCOUNTER — Encounter (HOSPITAL_COMMUNITY): Payer: Self-pay

## 2022-09-26 ENCOUNTER — Inpatient Hospital Stay (HOSPITAL_COMMUNITY)
Admission: EM | Admit: 2022-09-26 | Discharge: 2022-10-01 | DRG: 559 | Disposition: A | Discharge status: Skilled Nursing Facility | Payer: Medicare Other | Attending: Internal Medicine | Admitting: Internal Medicine

## 2022-09-26 DIAGNOSIS — R9082 White matter disease, unspecified: Secondary | ICD-10-CM | POA: Diagnosis not present

## 2022-09-26 DIAGNOSIS — I5022 Chronic systolic (congestive) heart failure: Secondary | ICD-10-CM | POA: Diagnosis not present

## 2022-09-26 DIAGNOSIS — T699XXA Effect of reduced temperature, unspecified, initial encounter: Secondary | ICD-10-CM | POA: Diagnosis not present

## 2022-09-26 DIAGNOSIS — I110 Hypertensive heart disease with heart failure: Secondary | ICD-10-CM | POA: Diagnosis not present

## 2022-09-26 DIAGNOSIS — N400 Enlarged prostate without lower urinary tract symptoms: Secondary | ICD-10-CM | POA: Diagnosis present

## 2022-09-26 DIAGNOSIS — M1990 Unspecified osteoarthritis, unspecified site: Secondary | ICD-10-CM | POA: Diagnosis present

## 2022-09-26 DIAGNOSIS — R29711 NIHSS score 11: Secondary | ICD-10-CM | POA: Diagnosis not present

## 2022-09-26 DIAGNOSIS — I672 Cerebral atherosclerosis: Secondary | ICD-10-CM | POA: Diagnosis present

## 2022-09-26 DIAGNOSIS — W1830XA Fall on same level, unspecified, initial encounter: Secondary | ICD-10-CM | POA: Diagnosis present

## 2022-09-26 DIAGNOSIS — Z96649 Presence of unspecified artificial hip joint: Secondary | ICD-10-CM | POA: Diagnosis not present

## 2022-09-26 DIAGNOSIS — R6889 Other general symptoms and signs: Secondary | ICD-10-CM | POA: Diagnosis not present

## 2022-09-26 DIAGNOSIS — M9701XA Periprosthetic fracture around internal prosthetic right hip joint, initial encounter: Secondary | ICD-10-CM | POA: Diagnosis not present

## 2022-09-26 DIAGNOSIS — R58 Hemorrhage, not elsewhere classified: Secondary | ICD-10-CM | POA: Diagnosis not present

## 2022-09-26 DIAGNOSIS — Z8719 Personal history of other diseases of the digestive system: Secondary | ICD-10-CM

## 2022-09-26 DIAGNOSIS — I5042 Chronic combined systolic (congestive) and diastolic (congestive) heart failure: Secondary | ICD-10-CM | POA: Diagnosis not present

## 2022-09-26 DIAGNOSIS — E876 Hypokalemia: Secondary | ICD-10-CM | POA: Diagnosis present

## 2022-09-26 DIAGNOSIS — D72829 Elevated white blood cell count, unspecified: Secondary | ICD-10-CM | POA: Diagnosis not present

## 2022-09-26 DIAGNOSIS — G9341 Metabolic encephalopathy: Secondary | ICD-10-CM | POA: Diagnosis not present

## 2022-09-26 DIAGNOSIS — I6789 Other cerebrovascular disease: Secondary | ICD-10-CM | POA: Diagnosis present

## 2022-09-26 DIAGNOSIS — Z87891 Personal history of nicotine dependence: Secondary | ICD-10-CM

## 2022-09-26 DIAGNOSIS — S72101A Unspecified trochanteric fracture of right femur, initial encounter for closed fracture: Secondary | ICD-10-CM | POA: Diagnosis not present

## 2022-09-26 DIAGNOSIS — M069 Rheumatoid arthritis, unspecified: Secondary | ICD-10-CM | POA: Diagnosis not present

## 2022-09-26 DIAGNOSIS — Y93H2 Activity, gardening and landscaping: Secondary | ICD-10-CM | POA: Diagnosis not present

## 2022-09-26 DIAGNOSIS — S72111A Displaced fracture of greater trochanter of right femur, initial encounter for closed fracture: Secondary | ICD-10-CM | POA: Diagnosis not present

## 2022-09-26 DIAGNOSIS — Z96651 Presence of right artificial knee joint: Secondary | ICD-10-CM | POA: Diagnosis not present

## 2022-09-26 DIAGNOSIS — M978XXA Periprosthetic fracture around other internal prosthetic joint, initial encounter: Secondary | ICD-10-CM | POA: Diagnosis not present

## 2022-09-26 DIAGNOSIS — S7001XD Contusion of right hip, subsequent encounter: Secondary | ICD-10-CM | POA: Diagnosis not present

## 2022-09-26 DIAGNOSIS — S32039A Unspecified fracture of third lumbar vertebra, initial encounter for closed fracture: Secondary | ICD-10-CM | POA: Diagnosis not present

## 2022-09-26 DIAGNOSIS — S0083XA Contusion of other part of head, initial encounter: Secondary | ICD-10-CM | POA: Insufficient documentation

## 2022-09-26 DIAGNOSIS — G319 Degenerative disease of nervous system, unspecified: Secondary | ICD-10-CM | POA: Diagnosis not present

## 2022-09-26 DIAGNOSIS — R1311 Dysphagia, oral phase: Secondary | ICD-10-CM | POA: Diagnosis not present

## 2022-09-26 DIAGNOSIS — H409 Unspecified glaucoma: Secondary | ICD-10-CM | POA: Diagnosis present

## 2022-09-26 DIAGNOSIS — I5021 Acute systolic (congestive) heart failure: Secondary | ICD-10-CM | POA: Diagnosis not present

## 2022-09-26 DIAGNOSIS — Z043 Encounter for examination and observation following other accident: Secondary | ICD-10-CM | POA: Diagnosis not present

## 2022-09-26 DIAGNOSIS — R3129 Other microscopic hematuria: Secondary | ICD-10-CM | POA: Diagnosis not present

## 2022-09-26 DIAGNOSIS — I63312 Cerebral infarction due to thrombosis of left middle cerebral artery: Secondary | ICD-10-CM | POA: Diagnosis not present

## 2022-09-26 DIAGNOSIS — H5442A5 Blindness left eye category 5, normal vision right eye: Secondary | ICD-10-CM | POA: Diagnosis not present

## 2022-09-26 DIAGNOSIS — Z7902 Long term (current) use of antithrombotics/antiplatelets: Secondary | ICD-10-CM

## 2022-09-26 DIAGNOSIS — F05 Delirium due to known physiological condition: Secondary | ICD-10-CM | POA: Diagnosis not present

## 2022-09-26 DIAGNOSIS — S7001XA Contusion of right hip, initial encounter: Secondary | ICD-10-CM | POA: Insufficient documentation

## 2022-09-26 DIAGNOSIS — Z23 Encounter for immunization: Secondary | ICD-10-CM | POA: Diagnosis not present

## 2022-09-26 DIAGNOSIS — R911 Solitary pulmonary nodule: Secondary | ICD-10-CM | POA: Diagnosis not present

## 2022-09-26 DIAGNOSIS — R410 Disorientation, unspecified: Secondary | ICD-10-CM | POA: Diagnosis not present

## 2022-09-26 DIAGNOSIS — I10 Essential (primary) hypertension: Secondary | ICD-10-CM | POA: Diagnosis present

## 2022-09-26 DIAGNOSIS — Z79899 Other long term (current) drug therapy: Secondary | ICD-10-CM | POA: Diagnosis not present

## 2022-09-26 DIAGNOSIS — Z79631 Long term (current) use of antimetabolite agent: Secondary | ICD-10-CM

## 2022-09-26 DIAGNOSIS — M6281 Muscle weakness (generalized): Secondary | ICD-10-CM | POA: Diagnosis not present

## 2022-09-26 DIAGNOSIS — S01111A Laceration without foreign body of right eyelid and periocular area, initial encounter: Secondary | ICD-10-CM | POA: Diagnosis present

## 2022-09-26 DIAGNOSIS — K219 Gastro-esophageal reflux disease without esophagitis: Secondary | ICD-10-CM | POA: Diagnosis present

## 2022-09-26 DIAGNOSIS — I6389 Other cerebral infarction: Secondary | ICD-10-CM | POA: Diagnosis not present

## 2022-09-26 DIAGNOSIS — S72009A Fracture of unspecified part of neck of unspecified femur, initial encounter for closed fracture: Secondary | ICD-10-CM | POA: Diagnosis present

## 2022-09-26 DIAGNOSIS — G459 Transient cerebral ischemic attack, unspecified: Secondary | ICD-10-CM | POA: Diagnosis not present

## 2022-09-26 DIAGNOSIS — S3993XA Unspecified injury of pelvis, initial encounter: Secondary | ICD-10-CM | POA: Diagnosis not present

## 2022-09-26 DIAGNOSIS — Z7952 Long term (current) use of systemic steroids: Secondary | ICD-10-CM

## 2022-09-26 DIAGNOSIS — R2689 Other abnormalities of gait and mobility: Secondary | ICD-10-CM | POA: Diagnosis not present

## 2022-09-26 DIAGNOSIS — I499 Cardiac arrhythmia, unspecified: Secondary | ICD-10-CM | POA: Diagnosis not present

## 2022-09-26 DIAGNOSIS — Z743 Need for continuous supervision: Secondary | ICD-10-CM | POA: Diagnosis not present

## 2022-09-26 DIAGNOSIS — R279 Unspecified lack of coordination: Secondary | ICD-10-CM | POA: Diagnosis not present

## 2022-09-26 DIAGNOSIS — I639 Cerebral infarction, unspecified: Secondary | ICD-10-CM | POA: Diagnosis not present

## 2022-09-26 DIAGNOSIS — W19XXXA Unspecified fall, initial encounter: Secondary | ICD-10-CM | POA: Diagnosis not present

## 2022-09-26 DIAGNOSIS — G8929 Other chronic pain: Secondary | ICD-10-CM | POA: Diagnosis not present

## 2022-09-26 DIAGNOSIS — S72141A Displaced intertrochanteric fracture of right femur, initial encounter for closed fracture: Secondary | ICD-10-CM | POA: Diagnosis not present

## 2022-09-26 DIAGNOSIS — M9701XD Periprosthetic fracture around internal prosthetic right hip joint, subsequent encounter: Secondary | ICD-10-CM | POA: Diagnosis not present

## 2022-09-26 DIAGNOSIS — S32009A Unspecified fracture of unspecified lumbar vertebra, initial encounter for closed fracture: Secondary | ICD-10-CM | POA: Insufficient documentation

## 2022-09-26 DIAGNOSIS — Z8673 Personal history of transient ischemic attack (TIA), and cerebral infarction without residual deficits: Secondary | ICD-10-CM

## 2022-09-26 DIAGNOSIS — T07XXXA Unspecified multiple injuries, initial encounter: Secondary | ICD-10-CM | POA: Diagnosis not present

## 2022-09-26 DIAGNOSIS — E872 Acidosis, unspecified: Secondary | ICD-10-CM | POA: Diagnosis not present

## 2022-09-26 DIAGNOSIS — S0003XA Contusion of scalp, initial encounter: Secondary | ICD-10-CM | POA: Diagnosis not present

## 2022-09-26 DIAGNOSIS — M978XXD Periprosthetic fracture around other internal prosthetic joint, subsequent encounter: Secondary | ICD-10-CM | POA: Diagnosis not present

## 2022-09-26 DIAGNOSIS — I6330 Cerebral infarction due to thrombosis of unspecified cerebral artery: Secondary | ICD-10-CM | POA: Diagnosis not present

## 2022-09-26 LAB — URINALYSIS, ROUTINE W REFLEX MICROSCOPIC
Bacteria, UA: NONE SEEN
Bilirubin Urine: NEGATIVE
Glucose, UA: NEGATIVE mg/dL
Ketones, ur: NEGATIVE mg/dL
Leukocytes,Ua: NEGATIVE
Nitrite: NEGATIVE
Protein, ur: 30 mg/dL — AB
RBC / HPF: >50 RBC/hpf — ABNORMAL HIGH (ref 0–5)
Specific Gravity, Urine: 1.013 (ref 1.005–1.030)
pH: 6 (ref 5.0–8.0)

## 2022-09-26 LAB — CBC
HCT: 43.9 % (ref 39.0–52.0)
Hemoglobin: 13.6 g/dL (ref 13.0–17.0)
MCH: 33.8 pg (ref 26.0–34.0)
MCHC: 31 g/dL (ref 30.0–36.0)
MCV: 109.2 fL — ABNORMAL HIGH (ref 80.0–100.0)
Platelets: 151 10*3/uL (ref 150–400)
RBC: 4.02 MIL/uL — ABNORMAL LOW (ref 4.22–5.81)
RDW: 14.5 % (ref 11.5–15.5)
WBC: 8.9 10*3/uL (ref 4.0–10.5)
nRBC: 0 % (ref 0.0–0.2)

## 2022-09-26 LAB — I-STAT CHEM 8, ED
BUN: 7 mg/dL — ABNORMAL LOW (ref 8–23)
Calcium, Ion: 1.05 mmol/L — ABNORMAL LOW (ref 1.15–1.40)
Chloride: 108 mmol/L (ref 98–111)
Creatinine, Ser: 1 mg/dL (ref 0.61–1.24)
Glucose, Bld: 102 mg/dL — ABNORMAL HIGH (ref 70–99)
HCT: 40 % (ref 39.0–52.0)
Hemoglobin: 13.6 g/dL (ref 13.0–17.0)
Potassium: 3.3 mmol/L — ABNORMAL LOW (ref 3.5–5.1)
Sodium: 141 mmol/L (ref 135–145)
TCO2: 20 mmol/L — ABNORMAL LOW (ref 22–32)

## 2022-09-26 LAB — COMPREHENSIVE METABOLIC PANEL
ALT: 14 U/L (ref 0–44)
AST: 24 U/L (ref 15–41)
Albumin: 3.9 g/dL (ref 3.5–5.0)
Alkaline Phosphatase: 71 U/L (ref 38–126)
Anion gap: 14 (ref 5–15)
BUN: 8 mg/dL (ref 8–23)
CO2: 20 mmol/L — ABNORMAL LOW (ref 22–32)
Calcium: 9.4 mg/dL (ref 8.9–10.3)
Chloride: 109 mmol/L (ref 98–111)
Creatinine, Ser: 1.1 mg/dL (ref 0.61–1.24)
GFR, Estimated: >60 mL/min (ref >60)
Glucose, Bld: 103 mg/dL — ABNORMAL HIGH (ref 70–99)
Potassium: 3.5 mmol/L (ref 3.5–5.1)
Sodium: 143 mmol/L (ref 135–145)
Total Bilirubin: 0.4 mg/dL (ref 0.3–1.2)
Total Protein: 5.7 g/dL — ABNORMAL LOW (ref 6.5–8.1)

## 2022-09-26 LAB — ETHANOL: Alcohol, Ethyl (B): <10 mg/dL (ref <10)

## 2022-09-26 MED ORDER — MORPHINE SULFATE (PF) 2 MG/ML IV SOLN
1.0000 mg | INTRAVENOUS | Status: DC | PRN
  Administered 2022-09-27 (×2): 1 mg via INTRAVENOUS
  Filled 2022-09-26 (×2): qty 1

## 2022-09-26 MED ORDER — PREDNISONE 5 MG PO TABS
5.0000 mg | ORAL_TABLET | Freq: Two times a day (BID) | ORAL | Status: DC
  Administered 2022-09-27 – 2022-10-01 (×10): 5 mg via ORAL
  Filled 2022-09-26 (×10): qty 1

## 2022-09-26 MED ORDER — AMLODIPINE BESYLATE 5 MG PO TABS
10.0000 mg | ORAL_TABLET | Freq: Every day | ORAL | Status: DC
  Administered 2022-09-27 – 2022-09-28 (×2): 10 mg via ORAL
  Filled 2022-09-26 (×2): qty 2

## 2022-09-26 MED ORDER — TETANUS-DIPHTH-ACELL PERTUSSIS 5-2.5-18.5 LF-MCG/0.5 IM SUSY
0.5000 mL | PREFILLED_SYRINGE | Freq: Once | INTRAMUSCULAR | Status: AC
  Administered 2022-09-26: 0.5 mL via INTRAMUSCULAR
  Filled 2022-09-26: qty 0.5

## 2022-09-26 MED ORDER — TAMSULOSIN HCL 0.4 MG PO CAPS
0.4000 mg | ORAL_CAPSULE | Freq: Every day | ORAL | Status: DC
  Administered 2022-09-27 – 2022-10-01 (×6): 0.4 mg via ORAL
  Filled 2022-09-26 (×6): qty 1

## 2022-09-26 MED ORDER — NALOXONE HCL 0.4 MG/ML IJ SOLN: 0.4000 mg | INTRAMUSCULAR | Status: DC | PRN

## 2022-09-26 MED ORDER — SULFASALAZINE 500 MG PO TABS
1000.0000 mg | ORAL_TABLET | Freq: Two times a day (BID) | ORAL | Status: DC
  Administered 2022-09-27 – 2022-10-01 (×10): 1000 mg via ORAL
  Filled 2022-09-26 (×11): qty 2

## 2022-09-26 MED ORDER — HYDROMORPHONE HCL 1 MG/ML IJ SOLN
1.0000 mg | Freq: Once | INTRAMUSCULAR | Status: AC
  Administered 2022-09-26: 1 mg via INTRAVENOUS
  Filled 2022-09-26: qty 1

## 2022-09-26 MED ORDER — POTASSIUM CHLORIDE CRYS ER 20 MEQ PO TBCR
40.0000 meq | EXTENDED_RELEASE_TABLET | Freq: Once | ORAL | Status: AC
  Administered 2022-09-27: 40 meq via ORAL
  Filled 2022-09-26: qty 2

## 2022-09-26 NOTE — ED Notes (Signed)
Dr. Yao at bedside. 

## 2022-09-26 NOTE — H&P (Signed)
History and Physical    Nathaniel [name] QJJ:[id number] DOB: 31-Dec-1934 DOA: 09/26/2022  PCP: Shirline Frees, MD  Patient coming from: Home  Chief Complaint: Fall  HPI: Nathaniel [name] is a 87 y.o. male with medical history significant of hypertension, GERD, rheumatoid arthritis, chronic pain, glaucoma with left eye blindness, chronic combined CHF, history of GI bleed, peripheral arterial disease on Plavix, BPH, history of right hip total arthroplasty presented to the ED with right hip pain after a mechanical fall.  He did strike his head.  Slightly hypertensive but remainder of vital signs stable.  Labs showing no leukocytosis or anemia, potassium 3.3, bicarb 20, anion gap 14, glucose 103, creatinine 1.1, blood ethanol level undetectable, UA with evidence of microscopic hematuria.  Chest x-ray negative for acute finding.  CT pelvis without contrast showing: "IMPRESSION: 1. Status post right hip total arthroplasty. 2. Minimally displaced perihardware fracture about the posterior aspect of the intratrochanteric right femur and right femoral arthroplasty component. 3. Overlying soft tissue contusion and hematoma. 4. No other displaced fracture or dislocation of the proximal left femur or bony pelvis. 5. Subacute appearing, partially callused fracture of the left L3 spinous process, partially imaged. 6. Osteopenia, which significantly limits sensitivity for nondisplaced fracture.   Aortic Atherosclerosis (ICD10-I70.0)."  CT head/maxillofacial/C-spine showing: "IMPRESSION: 1. No acute intracranial pathology. 2. Right supraorbital and forehead swelling/hematoma without underlying calvarial or facial bone fracture. 3. No acute fracture or traumatic malalignment of the cervical spine. 4. 6 mm nodule in the right lung apex. In the absence of prior studies for comparison, non-contrast chest CT at 6-12 months is recommended, depending on patient comorbidities given age."  Patient was given  Dilaudid and Tdap injection.  ED physician discussed the case with on-call physician for [facility] (Dr. Rhona Raider) who recommended nonoperative management, PT/OT/pain control.  Orthopedics will consult in the morning.  [facility] called to admit.  Patient states he has chronic knee problems and had right knee replacement surgery done in the past.  States this evening while walking from his driveway to his house his knees gave out and he fell on concrete striking his head.  Denies loss of consciousness.  He is reporting pain in his right hip since after the fall and also having a slight headache.  He takes Plavix at home and thinks his wife might have given it to him this evening but is not sure.  He has no other complaints.  Denies fevers, cough, shortness of breath, chest pain, nausea, vomiting, abdominal pain, diarrhea, back or flank pain, or hematuria.  Review of Systems:  Review of Systems  All other systems reviewed and are negative.   Past Medical History:  Diagnosis Date   AKI (acute kidney injury) ([place]) 04/17/2018   Altered mental status 04/17/2018   Arthritis    Blood transfusion 2005   s/p hip replacement   Colon polyps    Environmental allergies    pollen , leaves, grass   Erectile dysfunction    Glaucoma of left eye    legally blind in left eye   Hypertension     Past Surgical History:  Procedure Laterality Date   CATARACT EXTRACTION     bilateral   ESOPHAGOGASTRODUODENOSCOPY (EGD) WITH PROPOFOL N/A 04/22/2019   Procedure: ESOPHAGOGASTRODUODENOSCOPY (EGD) WITH PROPOFOL;  Surgeon: Ladene Artist, MD;  Location: [facility];  Service: Endoscopy;  Laterality: N/A;   EYE SURGERY     GLAUCOMA VALVE INSERTION  [id number]   Left eye   HIP ARTHROPLASTY  2005   Right Hip replacement x 3   HOT HEMOSTASIS N/A 04/22/2019   Procedure: HOT HEMOSTASIS (ARGON PLASMA COAGULATION/BICAP);  Surgeon: Ladene Artist, MD;  Location: [facility];  Service: Endoscopy;  Laterality: N/A;    JOINT REPLACEMENT     MULTIPLE TOOTH EXTRACTIONS  2012   had 23 teeth pulled   TOENAIL EXCISION     TOTAL KNEE ARTHROPLASTY  12/15/2011   Procedure: TOTAL KNEE ARTHROPLASTY;  Surgeon: Kerin Salen, MD;  Location: [facility];  Service: Orthopedics;  Laterality: Right;  DEPUY SIGMA      reports that he quit smoking about 23 years ago. His smoking use included cigarettes. He has a 10.00 pack-year smoking history. He has quit using smokeless tobacco.  His smokeless tobacco use included chew. He reports current alcohol use. He reports that he does not use drugs.  Allergies  Allergen Reactions   Ibuprofen Hives    Cannot take "Advil", but is able to tolerate ibuprofen   Tylenol [Acetaminophen] Rash    History reviewed. No pertinent family history.  Prior to Admission medications   Medication Sig Start Date End Date Taking? Authorizing Provider  ACTEMRA ACTPEN 162 MG/0.9ML SOAJ Inject 162 mg into the skin every Thursday. 11/12/21  Yes [provider]  amLODipine (NORVASC) 10 MG tablet Take 1 tablet (10 mg total) by mouth daily. 02/17/22  Yes Thurnell Lose, MD  baclofen (LIORESAL) 10 MG tablet Take 1 tablet (10 mg total) by mouth 2 (two) times daily. Patient taking differently: Take 10 mg by mouth 4 (four) times daily. 04/18/18  Yes Alma Friendly, MD  brimonidine (ALPHAGAN) 0.2 % ophthalmic solution Place 1 drop into both eyes in the morning and at bedtime. 11/04/21  Yes [provider]  cetirizine (ZYRTEC) 10 MG tablet Take 10 mg by mouth daily.   Yes [provider]  clopidogrel (PLAVIX) 75 MG tablet Take 1 tablet (75 mg total) by mouth daily. 02/19/22  Yes Thurnell Lose, MD  diclofenac sodium (VOLTAREN) 1 % GEL Apply 1 application. topically in the morning and at bedtime. Left knee and both feet 04/15/18  Yes [provider]  dorzolamide (TRUSOPT) 2 % ophthalmic solution Place 1 drop into both eyes 2 (two) times daily. 01/03/22  Yes [provider]  ferrous sulfate 325 (65 FE) MG tablet Take 325 mg by mouth daily with breakfast.   Yes [provider]  folic acid (FOLVITE) 1 MG tablet Take 1 mg by mouth at bedtime.    Yes [provider]  gabapentin (NEURONTIN) 800 MG tablet Take 800 mg by mouth 4 (four) times daily.   Yes [provider]  HYDROcodone-acetaminophen (NORCO/VICODIN) 5-325 MG tablet Take 1 tablet by mouth in the morning, at noon, and at bedtime. 03/09/15  Yes [provider]  methotrexate 50 MG/2ML injection Inject 25 mg into the skin every Wednesday.   Yes [provider]  Omega-3 Fatty Acids (FISH OIL) 1000 MG CPDR Take 1,000 mg by mouth daily.   Yes [provider]  polyethylene glycol (MIRALAX / GLYCOLAX) 17 g packet Take 17 g by mouth at bedtime.   Yes [provider]  predniSONE (DELTASONE) 5 MG tablet Take 5 mg by mouth in the morning and at bedtime. 07/23/22  Yes [provider]  sulfaSALAzine (AZULFIDINE) 500 MG tablet Take 1,000 mg by mouth 2 (two) times daily with breakfast and lunch. 01/03/22  Yes [provider]  tamsulosin (FLOMAX) 0.4 MG CAPS  capsule Take 0.4 mg by mouth at bedtime. 09/19/22  Yes [provider]  traZODone (DESYREL) 150 MG tablet Take 150 mg by mouth at bedtime. 11/28/21  Yes [provider]  TURMERIC PO Take 1 capsule by mouth daily.   Yes [provider]  vitamin B-12 (CYANOCOBALAMIN) 1000 MCG tablet Take 1,000 mcg by mouth daily.   Yes [provider]  vitamin C (ASCORBIC ACID) 500 MG tablet Take 500 mg by mouth daily.   Yes [provider]    Physical Exam: Vitals:   09/26/22 2015 09/26/22 2030 09/26/22 2045 09/26/22 2206  BP: (!) 160/90 (!) 140/87 (!) 156/85   Pulse: 80 86 85   Resp: '15 20 12   '$ Temp:    98.3 F (36.8 C)  TempSrc:    Oral  SpO2: 97% 93% 90%   Weight:      Height:        Physical Exam Vitals reviewed.  Constitutional:      General: He is not  in acute distress. HENT:     Head: Normocephalic and atraumatic.     Comments: Right periorbital bruising and forehead hematoma Eyes:     Extraocular Movements: Extraocular movements intact.  Cardiovascular:     Rate and Rhythm: Normal rate and regular rhythm.     Pulses: Normal pulses.  Pulmonary:     Effort: Pulmonary effort is normal. No respiratory distress.     Breath sounds: Normal breath sounds. No wheezing or rales.  Abdominal:     General: Bowel sounds are normal. There is no distension.     Palpations: Abdomen is soft.     Tenderness: There is no abdominal tenderness.  Musculoskeletal:     Cervical back: Normal range of motion.     Right lower leg: No edema.     Left lower leg: No edema.  Skin:    General: Skin is warm and dry.  Neurological:     General: No focal deficit present.     Mental Status: He is alert and oriented to person, place, and time.     Labs on Admission: I have personally reviewed following labs and imaging studies  CBC: Recent Labs  Lab 09/26/22 1739 09/26/22 1749  WBC 8.9  --   HGB 13.6 13.6  HCT 43.9 40.0  MCV 109.2*  --   PLT 151  --    Basic Metabolic Panel: Recent Labs  Lab 09/26/22 1739 09/26/22 1749  NA 143 141  K 3.5 3.3*  CL 109 108  CO2 20*  --   GLUCOSE 103* 102*  BUN 8 7*  CREATININE 1.10 1.00  CALCIUM 9.4  --    GFR: Estimated Creatinine Clearance: 55.8 mL/min (by C-G formula based on SCr of 1 mg/dL). Liver Function Tests: Recent Labs  Lab 09/26/22 1739  AST 24  ALT 14  ALKPHOS 71  BILITOT 0.4  PROT 5.7*  ALBUMIN 3.9   No results for input(s): "LIPASE", "AMYLASE" in the last 168 hours. No results for input(s): "AMMONIA" in the last 168 hours. Coagulation Profile: No results for input(s): "INR", "PROTIME" in the last 168 hours. Cardiac Enzymes: No results for input(s): "CKTOTAL", "CKMB", "CKMBINDEX", "TROPONINI" in the last 168 hours. BNP (last 3 results) No results for input(s): "PROBNP" in the last  8760 hours. HbA1C: No results for input(s): "HGBA1C" in the last 72 hours. CBG: No results for input(s): "GLUCAP" in the last 168 hours. Lipid Profile: No results for input(s): "CHOL", "HDL", "LDLCALC", "  TRIG", "CHOLHDL", "LDLDIRECT" in the last 72 hours. Thyroid Function Tests: No results for input(s): "TSH", "T4TOTAL", "FREET4", "T3FREE", "THYROIDAB" in the last 72 hours. Anemia Panel: No results for input(s): "VITAMINB12", "FOLATE", "FERRITIN", "TIBC", "IRON", "RETICCTPCT" in the last 72 hours. Urine analysis:    Component Value Date/Time   COLORURINE YELLOW 09/26/2022 2021   APPEARANCEUR CLEAR 09/26/2022 2021   LABSPEC 1.013 09/26/2022 2021   PHURINE 6.0 09/26/2022 2021   GLUCOSEU NEGATIVE 09/26/2022 2021   HGBUR SMALL (A) 09/26/2022 2021   BILIRUBINUR NEGATIVE 09/26/2022 2021   KETONESUR NEGATIVE 09/26/2022 2021   PROTEINUR 30 (A) 09/26/2022 2021   UROBILINOGEN 0.2 04/19/2015 1010   NITRITE NEGATIVE 09/26/2022 2021   LEUKOCYTESUR NEGATIVE 09/26/2022 2021    Radiological Exams on Admission: CT HEAD WO CONTRAST  Result Date: 09/26/2022 CLINICAL DATA:  Fall EXAM: CT HEAD WITHOUT CONTRAST CT MAXILLOFACIAL WITHOUT CONTRAST CT CERVICAL SPINE WITHOUT CONTRAST TECHNIQUE: Multidetector CT imaging of the head, cervical spine, and maxillofacial structures were performed using the standard protocol without intravenous contrast. Multiplanar CT image reconstructions of the cervical spine and maxillofacial structures were also generated. RADIATION DOSE REDUCTION: This exam was performed according to the departmental dose-optimization program which includes automated exposure control, adjustment of the mA and/or kV according to patient size and/or use of iterative reconstruction technique. COMPARISON:  CT head 02/14/2022, MR head 04/17/2018 FINDINGS: CT HEAD FINDINGS Brain: There is no acute intracranial hemorrhage, extra-axial fluid collection, or acute infarct. Parenchymal volume is normal for  age. The ventricles are normal in size. Gray-white differentiation is preserved. 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 An additional small focus of hypodensity in the centrum semiovale on the left more posteriorly may reflect background chronic small-vessel ischemic change or interval infarct, though still remote in appearance. There is no mass lesion.  There is no mass effect or midline shift. Vascular: There is calcification of the bilateral carotid siphons. Skull: Normal. Negative for fracture or focal lesion. Other: None. CT MAXILLOFACIAL FINDINGS Osseous: There is no acute facial bone fracture. There is no evidence of mandibular dislocation. There is no suspicious osseous lesion. Orbits: There is significant right periorbital soft tissue swelling with a right frontal scalp hematoma. There is no retrobulbar hematoma. The globe is intact. 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 There is a punctate metallic density foreign body in the left globe, present in 2016. Note that at least 2 brain MRIs were obtained after the CT in 2016 which demonstrated this foreign body. Sinuses: Clear. Soft tissues: Right supraorbital soft tissue swelling and frontal scalp swelling/hematoma as above. CT CERVICAL SPINE FINDINGS Alignment: There is trace anterolisthesis of C4 on C5, C5 on C6, and C7 on T1. There is no jumped or perched facet or other evidence of traumatic malalignment. Skull base and vertebrae: Skull base alignment is maintained. There is no evidence of acute fracture. There is no suspicious osseous lesion. Soft tissues and spinal canal: No prevertebral fluid or swelling. No visible canal hematoma. Disc levels: There is disc space narrowing and degenerative endplate change most advanced at C2-C3 and C6-C7. Facet arthropathy is most advanced on the left at C3-C4. There is no evidence of high-grade spinal  canal stenosis. Upper chest: There is fibrotic change with architectural distortion/scarring in both lung apices. There is a 6 mm nodule in the right lung apex. Other: None. IMPRESSION: 1. No acute intracranial pathology. 2. Right  supraorbital and forehead swelling/hematoma without underlying calvarial or facial bone fracture. 3. No acute fracture or traumatic malalignment of the cervical spine. 4. 6 mm nodule in the right lung apex. In the absence of prior studies for comparison, non-contrast chest CT at 6-12 months is recommended, depending on patient comorbidities given age. Electronically Signed   By: Valetta Mole M.D.   On: 09/26/2022 18:50   CT MAXILLOFACIAL WO CONTRAST  Result Date: 09/26/2022 CLINICAL DATA:  Fall EXAM: CT HEAD WITHOUT CONTRAST CT MAXILLOFACIAL WITHOUT CONTRAST CT CERVICAL SPINE WITHOUT CONTRAST TECHNIQUE: Multidetector CT imaging of the head, cervical spine, and maxillofacial structures were performed using the standard protocol without intravenous contrast. Multiplanar CT image reconstructions of the cervical spine and maxillofacial structures were also generated. RADIATION DOSE REDUCTION: This exam was performed according to the departmental dose-optimization program which includes automated exposure control, adjustment of the mA and/or kV according to patient size and/or use of iterative reconstruction technique. COMPARISON:  CT head 02/14/2022, MR head 04/17/2018 FINDINGS: CT HEAD FINDINGS Brain: There is no acute intracranial hemorrhage, extra-axial fluid collection, or acute infarct. Parenchymal volume is normal for age. The ventricles are normal in size. Gray-white differentiation is preserved. 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 An additional small focus of hypodensity in the centrum semiovale on the left more posteriorly may reflect background chronic small-vessel ischemic change or  interval infarct, though still remote in appearance. There is no mass lesion.  There is no mass effect or midline shift. Vascular: There is calcification of the bilateral carotid siphons. Skull: Normal. Negative for fracture or focal lesion. Other: None. CT MAXILLOFACIAL FINDINGS Osseous: There is no acute facial bone fracture. There is no evidence of mandibular dislocation. There is no suspicious osseous lesion. Orbits: There is significant right periorbital soft tissue swelling with a right frontal scalp hematoma. There is no retrobulbar hematoma. The globe is intact. 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 There is a punctate metallic density foreign body in the left globe, present in 2016. Note that at least 2 brain MRIs were obtained after the CT in 2016 which demonstrated this foreign body. Sinuses: Clear. Soft tissues: Right supraorbital soft tissue swelling and frontal scalp swelling/hematoma as above. CT CERVICAL SPINE FINDINGS Alignment: There is trace anterolisthesis of C4 on C5, C5 on C6, and C7 on T1. There is no jumped or perched facet or other evidence of traumatic malalignment. Skull base and vertebrae: Skull base alignment is maintained. There is no evidence of acute fracture. There is no suspicious osseous lesion. Soft tissues and spinal canal: No prevertebral fluid or swelling. No visible canal hematoma. Disc levels: There is disc space narrowing and degenerative endplate change most advanced at C2-C3 and C6-C7. Facet arthropathy is most advanced on the left at C3-C4. There is no evidence of high-grade spinal canal stenosis. Upper chest: There is fibrotic change with architectural distortion/scarring in both lung apices. There is a 6 mm nodule in the right lung apex. Other: None. IMPRESSION: 1. No acute intracranial pathology. 2. Right supraorbital and forehead swelling/hematoma without underlying calvarial or facial bone fracture. 3. No acute fracture or traumatic  malalignment of the cervical spine. 4. 6 mm nodule in the right lung apex. In the absence of prior studies for comparison, non-contrast chest CT at 6-12 months is recommended, depending on patient comorbidities given age. Electronically Signed   By: Court Joy.D.  On: 09/26/2022 18:50   CT CERVICAL SPINE WO CONTRAST  Result Date: 09/26/2022 CLINICAL DATA:  Fall EXAM: CT HEAD WITHOUT CONTRAST CT MAXILLOFACIAL WITHOUT CONTRAST CT CERVICAL SPINE WITHOUT CONTRAST TECHNIQUE: Multidetector CT imaging of the head, cervical spine, and maxillofacial structures were performed using the standard protocol without intravenous contrast. Multiplanar CT image reconstructions of the cervical spine and maxillofacial structures were also generated. RADIATION DOSE REDUCTION: This exam was performed according to the departmental dose-optimization program which includes automated exposure control, adjustment of the mA and/or kV according to patient size and/or use of iterative reconstruction technique. COMPARISON:  CT head 02/14/2022, MR head 04/17/2018 FINDINGS: CT HEAD FINDINGS Brain: There is no acute intracranial hemorrhage, extra-axial fluid collection, or acute infarct. Parenchymal volume is normal for age. The ventricles are normal in size. Gray-white differentiation is preserved. 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 An additional small focus of hypodensity in the centrum semiovale on the left more posteriorly may reflect background chronic small-vessel ischemic change or interval infarct, though still remote in appearance. There is no mass lesion.  There is no mass effect or midline shift. Vascular: There is calcification of the bilateral carotid siphons. Skull: Normal. Negative for fracture or focal lesion. Other: None. CT MAXILLOFACIAL FINDINGS Osseous: There is no acute facial bone fracture. There is no evidence of  mandibular dislocation. There is no suspicious osseous lesion. Orbits: There is significant right periorbital soft tissue swelling with a right frontal scalp hematoma. There is no retrobulbar hematoma. The globe is intact. 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 There is a punctate metallic density foreign body in the left globe, present in 2016. Note that at least 2 brain MRIs were obtained after the CT in 2016 which demonstrated this foreign body. Sinuses: Clear. Soft tissues: Right supraorbital soft tissue swelling and frontal scalp swelling/hematoma as above. CT CERVICAL SPINE FINDINGS Alignment: There is trace anterolisthesis of C4 on C5, C5 on C6, and C7 on T1. There is no jumped or perched facet or other evidence of traumatic malalignment. Skull base and vertebrae: Skull base alignment is maintained. There is no evidence of acute fracture. There is no suspicious osseous lesion. Soft tissues and spinal canal: No prevertebral fluid or swelling. No visible canal hematoma. Disc levels: There is disc space narrowing and degenerative endplate change most advanced at C2-C3 and C6-C7. Facet arthropathy is most advanced on the left at C3-C4. There is no evidence of high-grade spinal canal stenosis. Upper chest: There is fibrotic change with architectural distortion/scarring in both lung apices. There is a 6 mm nodule in the right lung apex. Other: None. IMPRESSION: 1. No acute intracranial pathology. 2. Right supraorbital and forehead swelling/hematoma without underlying calvarial or facial bone fracture. 3. No acute fracture or traumatic malalignment of the cervical spine. 4. 6 mm nodule in the right lung apex. In the absence of prior studies for comparison, non-contrast chest CT at 6-12 months is recommended, depending on patient comorbidities given age. Electronically Signed   By: Valetta Mole M.D.   On: 09/26/2022 18:50   CT PELVIS WO CONTRAST  Result Date: 09/26/2022 CLINICAL DATA:   Fall hip fracture suspected EXAM: CT PELVIS WITHOUT CONTRAST TECHNIQUE: Multidetector CT imaging of the pelvis was performed following the standard protocol without intravenous contrast. RADIATION DOSE REDUCTION: This exam was performed according to the departmental dose-optimization program which includes automated exposure control, adjustment of  the mA and/or kV according to patient size and/or use of iterative reconstruction technique. COMPARISON:  Same day radiographs FINDINGS: Urinary Tract:  No abnormality visualized. Bowel:  Unremarkable visualized pelvic bowel loops. Vascular/Lymphatic: No pathologically enlarged lymph nodes. Aortic atherosclerosis. Reproductive:  No mass or other significant abnormality Other:  None. Musculoskeletal: Status post right hip total arthroplasty. Minimally displaced perihardware fracture about the posterior aspect of the intratrochanteric right femur and right femoral arthroplasty component (series 3, image 114, 129). Overlying soft tissue contusion and hematoma. The acetabular component and bony acetabulum appear intact. No other displaced fracture or dislocation of the proximal left femur or bony pelvis. Subacute appearing, partially callused fracture of the left L3 spinous process, partially included (series 3, image 1). Posterior osteopenia. IMPRESSION: 1. Status post right hip total arthroplasty. 2. Minimally displaced perihardware fracture about the posterior aspect of the intratrochanteric right femur and right femoral arthroplasty component. 3. Overlying soft tissue contusion and hematoma. 4. No other displaced fracture or dislocation of the proximal left femur or bony pelvis. 5. Subacute appearing, partially callused fracture of the left L3 spinous process, partially imaged. 6. Osteopenia, which significantly limits sensitivity for nondisplaced fracture. Aortic Atherosclerosis (ICD10-I70.0). Electronically Signed   By: Delanna Ahmadi M.D.   On: 09/26/2022 18:30   DG  Pelvis Portable  Result Date: 09/26/2022 CLINICAL DATA:  [place]. EXAM: PORTABLE PELVIS 1-2 VIEWS COMPARISON:  Radiographs 02/14/2022 FINDINGS: The right hip components are intact. No dislocation. Suspect a small nondisplaced fracture involving the lesser trochanter. The bony pelvis and left hip are intact. IMPRESSION: Suspect small nondisplaced fracture involving the right lesser trochanter. Electronically Signed   By: Marijo Sanes M.D.   On: 09/26/2022 18:07   DG Femur Min 2 Views Right  Result Date: 09/26/2022 CLINICAL DATA:  Fall, pain EXAM: RIGHT FEMUR 2 VIEWS COMPARISON:  None Available. FINDINGS: Status post right hip total arthroplasty and right knee total arthroplasty. Acute, minimally displaced perihardware fracture of the intratrochanteric right femur about the femoral stem component, best appreciated on cross-table lateral view. No perihardware fracture about the knee. Soft tissues unremarkable. IMPRESSION: 1. Status post right hip total arthroplasty and right knee total arthroplasty. 2. Acute, minimally displaced perihardware fracture of the intratrochanteric right femur about the femoral stem component. 3.  No perihardware fracture about the knee. Electronically Signed   By: Delanna Ahmadi M.D.   On: 09/26/2022 18:06   DG Chest Port 1 View  Result Date: 09/26/2022 CLINICAL DATA:  [place]. EXAM: PORTABLE CHEST 1 VIEW COMPARISON:  04/17/2018 FINDINGS: The cardiac silhouette, mediastinal and hilar contours are within normal limits and stable. No acute pulmonary findings. No pleural effusion or pneumothorax. The bony thorax is intact. IMPRESSION: No acute cardiopulmonary findings. Electronically Signed   By: Marijo Sanes M.D.   On: 09/26/2022 18:04    Assessment and Plan  Right hip periprosthetic fracture Patient with history of right hip total arthroplasty presenting right hip pain after a mechanical fall.  CT showing minimally displaced perihardware fracture about the posterior aspect of the  intertrochanteric right femur and right femoral arthroplasty component.  Overlying soft tissue contusion and hematoma. -Orthopedics recommended nonoperative management, will consult in a.m. -PT/OT eval, fall precautions -Pain management  Right hip hematoma -Hold Plavix at this time and monitor H&H -Avoid anticoagulation for DVT prophylaxis  Right supraorbital and forehead swelling/hematoma No underlying calvarial or facial bone fracture seen on CT. -Hold Plavix at this time and monitor H&H -Avoid anticoagulation for DVT prophylaxis  Subacute left L3  spinous process fracture Patient is not endorsing back pain.  Incidental pulmonary nodule CT showing a 6 mm nodule in the right lung apex.  Patient is a former smoker. -Radiologist recommending follow-up noncontrast CT chest in 6 to 12 months.  Mild hypokalemia -Replace potassium and continue to monitor -Check magnesium level and replace if low  Mild normal anion gap metabolic acidosis Bicarb 20.  No significant hyperglycemia and renal function stable. -Monitor BMP  Microscopic hematuria ?Nephrolithiasis but patient is not endorsing any flank pain. -Outpatient follow-up  Hypertension Currently normotensive. -Continue amlodipine  Rheumatoid arthritis -On weekly Actemra and methotrexate -Continue prednisone and sulfasalazine  Chronic combined CHF No signs of volume overload.  Echo done June 2016 showing LVEF 40 to 31%, grade 1 diastolic dysfunction, and trivial AVR. -Repeat echocardiogram  Peripheral arterial disease -Hold Plavix at this time given fall and hematomas  BPH -Continue Flomax  DVT prophylaxis: SCDs Code Status: Full Code (discussed with the patient) Family Communication: No family available at this time. Consults called: Orthopedics Level of care: Med-Surg Admission status: It is my clinical opinion that referral for OBSERVATION is reasonable and necessary in this patient based on the above information  provided. The aforementioned taken together are felt to place the patient at high risk for further clinical deterioration. However, it is anticipated that the patient may be medically stable for discharge from the hospital within 24 to 48 hours.   Shela Leff MD [facility]  If 7PM-7AM, please contact night-coverage [email or web address]  09/26/2022, 10:09 PM

## 2022-09-26 NOTE — Progress Notes (Signed)
Orthopedic Tech Progress Note Patient Details:  Nathaniel [name] 16-Mar-1935 [id number]  Patient ID: [name], male   DOB: [birth year], 87 y.o.   MRN: [id number] Level II; not currently needed. [name] 09/26/2022, 6:11 PM

## 2022-09-26 NOTE — ED Provider Notes (Signed)
[place] EMERGENCY DEPARTMENT Provider Note   CSN: [id number] Arrival date & time: 09/26/22  1733     History  Chief Complaint  Patient presents with   Fall    Mechanical fall while blowing leaves, + head strike, on thinners.     BOEN STERBENZ is a 87 y.o. male history of previous right hip replacement, stroke on Plavix, here presenting with fall.  Patient was blowing leaves and had a mechanical fall and fell forward and hit his head.  Patient was activated as a level 2 trauma.  Patient also complains of right hip pain.  The history is provided by the patient.       Home Medications Prior to Admission medications   Medication Sig Start Date End Date Taking? Authorizing Provider  ACTEMRA ACTPEN 162 MG/0.9ML SOAJ Inject 162 mg into the skin every Thursday. 11/12/21   [provider]  amLODipine (NORVASC) 10 MG tablet Take 1 tablet (10 mg total) by mouth daily. 02/17/22   Thurnell Lose, MD  baclofen (LIORESAL) 10 MG tablet Take 1 tablet (10 mg total) by mouth 2 (two) times daily. 04/18/18   Alma Friendly, MD  brimonidine (ALPHAGAN) 0.2 % ophthalmic solution Place 1 drop into both eyes in the morning and at bedtime. 11/04/21   [provider]  cetirizine (ZYRTEC) 10 MG tablet Take 10 mg by mouth daily.    [provider]  clopidogrel (PLAVIX) 75 MG tablet Take 1 tablet (75 mg total) by mouth daily. 02/19/22   Thurnell Lose, MD  diclofenac sodium (VOLTAREN) 1 % GEL Apply 1 application. topically in the morning and at bedtime. Left knee and both feet 04/15/18   [provider]  dorzolamide (TRUSOPT) 2 % ophthalmic solution Place 1 drop into both eyes 2 (two) times daily. 01/03/22   [provider]  FIBER ADULT GUMMIES PO Take 1 tablet by mouth daily.    [provider]  folic acid (FOLVITE) 1 MG tablet Take 1 mg by mouth at bedtime.     [provider]  gabapentin (NEURONTIN) 800 MG tablet Take 800  mg by mouth 4 (four) times daily.    [provider]  HYDROcodone-acetaminophen (NORCO/VICODIN) 5-325 MG tablet Take 1 tablet by mouth every 8 (eight) hours as needed for moderate pain. 03/09/15   [provider]  methotrexate 50 MG/2ML injection Inject 25 mg into the skin every Wednesday.    [provider]  Omega-3 Fatty Acids (FISH OIL) 1000 MG CPDR Take 1,000 mg by mouth daily.    [provider]  pantoprazole (PROTONIX) 40 MG tablet Take 1 tablet (40 mg total) by mouth daily. Patient not taking: Reported on 02/14/2022 04/25/19   Raiford Noble Latif, DO  sulfaSALAzine (AZULFIDINE) 500 MG tablet Take 1,000 mg by mouth 2 (two) times daily. 01/03/22   [provider]  traZODone (DESYREL) 150 MG tablet Take 150 mg by mouth at bedtime. 11/28/21   [provider]  TURMERIC PO Take 1 capsule by mouth daily.    [provider]  vitamin B-12 (CYANOCOBALAMIN) 1000 MCG tablet Take 1,000 mcg by mouth daily.    [provider]  vitamin C (ASCORBIC ACID) 500 MG tablet Take 500 mg by mouth daily.    [provider]      Allergies    Ibuprofen and Tylenol [acetaminophen]    Review of Systems   Review of Systems  Musculoskeletal:  Right hip pain  Skin:  Positive for wound.  All other systems reviewed and are negative.   Physical Exam Updated Vital Signs BP (!) 156/85   Pulse 85   Temp 98.2 F (36.8 C) (Oral)   Resp 12   Ht '5\' 10"'$  (1.778 m)   Wt 73.5 kg   SpO2 90%   BMI 23.24 kg/m  Physical Exam Vitals and nursing note reviewed.  Constitutional:      Comments: Uncomfortable  HENT:     Head:     Comments: Patient has contusion on the right forehead.    Mouth/Throat:     Mouth: Mucous membranes are moist.  Eyes:     Extraocular Movements: Extraocular movements intact.     Pupils: Pupils are equal, round, and reactive to light.  Cardiovascular:     Rate and Rhythm: Normal rate and regular rhythm.      Pulses: Normal pulses.     Heart sounds: Normal heart sounds.  Pulmonary:     Effort: Pulmonary effort is normal.     Breath sounds: Normal breath sounds.  Abdominal:     General: Abdomen is flat.     Palpations: Abdomen is soft.  Musculoskeletal:     Cervical back: Normal range of motion and neck supple.     Comments: + Right hip deformity and right pelvic tenderness  Skin:    General: Skin is warm.  Neurological:     General: No focal deficit present.     Mental Status: He is oriented to person, place, and time.  Psychiatric:        Mood and Affect: Mood normal.        Behavior: Behavior normal.     ED Results / Procedures / Treatments   Labs (all labs ordered are listed, but only abnormal results are displayed) Labs Reviewed  COMPREHENSIVE METABOLIC PANEL - Abnormal; Notable for the following components:      Result Value   CO2 20 (*)    Glucose, Bld 103 (*)    Total Protein 5.7 (*)    All other components within normal limits  CBC - Abnormal; Notable for the following components:   RBC 4.02 (*)    MCV 109.2 (*)    All other components within normal limits  URINALYSIS, ROUTINE W REFLEX MICROSCOPIC - Abnormal; Notable for the following components:   Hgb urine dipstick SMALL (*)    Protein, ur 30 (*)    RBC / HPF >50 (*)    All other components within normal limits  I-STAT CHEM 8, ED - Abnormal; Notable for the following components:   Potassium 3.3 (*)    BUN 7 (*)    Glucose, Bld 102 (*)    Calcium, Ion 1.05 (*)    TCO2 20 (*)    All other components within normal limits  ETHANOL    EKG None  Radiology CT HEAD WO CONTRAST  Result Date: 09/26/2022 CLINICAL DATA:  Fall EXAM: CT HEAD WITHOUT CONTRAST CT MAXILLOFACIAL WITHOUT CONTRAST CT CERVICAL SPINE WITHOUT CONTRAST TECHNIQUE: Multidetector CT imaging of the head, cervical spine, and maxillofacial structures were performed using the standard protocol without intravenous contrast. Multiplanar CT image  reconstructions of the cervical spine and maxillofacial structures were also generated. RADIATION DOSE REDUCTION: This exam was performed according to the departmental dose-optimization program which includes automated exposure control, adjustment of the mA and/or kV according to patient size and/or use of iterative reconstruction technique. COMPARISON:  CT head 02/14/2022, MR  head 04/17/2018 FINDINGS: CT HEAD FINDINGS Brain: There is no acute intracranial hemorrhage, extra-axial fluid collection, or acute infarct. Parenchymal volume is normal for age. The ventricles are normal in size. Gray-white differentiation is preserved. 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 An additional small focus of hypodensity in the centrum semiovale on the left more posteriorly may reflect background chronic small-vessel ischemic change or interval infarct, though still remote in appearance. There is no mass lesion.  There is no mass effect or midline shift. Vascular: There is calcification of the bilateral carotid siphons. Skull: Normal. Negative for fracture or focal lesion. Other: None. CT MAXILLOFACIAL FINDINGS Osseous: There is no acute facial bone fracture. There is no evidence of mandibular dislocation. There is no suspicious osseous lesion. Orbits: There is significant right periorbital soft tissue swelling with a right frontal scalp hematoma. There is no retrobulbar hematoma. The globe is intact. 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 There is a punctate metallic density foreign body in the left globe, present in 2016. Note that at least 2 brain MRIs were obtained after the CT in 2016 which demonstrated this foreign body. Sinuses: Clear. Soft tissues: Right supraorbital soft tissue swelling and frontal scalp swelling/hematoma as above. CT CERVICAL SPINE FINDINGS Alignment: There is trace anterolisthesis of C4  on C5, C5 on C6, and C7 on T1. There is no jumped or perched facet or other evidence of traumatic malalignment. Skull base and vertebrae: Skull base alignment is maintained. There is no evidence of acute fracture. There is no suspicious osseous lesion. Soft tissues and spinal canal: No prevertebral fluid or swelling. No visible canal hematoma. Disc levels: There is disc space narrowing and degenerative endplate change most advanced at C2-C3 and C6-C7. Facet arthropathy is most advanced on the left at C3-C4. There is no evidence of high-grade spinal canal stenosis. Upper chest: There is fibrotic change with architectural distortion/scarring in both lung apices. There is a 6 mm nodule in the right lung apex. Other: None. IMPRESSION: 1. No acute intracranial pathology. 2. Right supraorbital and forehead swelling/hematoma without underlying calvarial or facial bone fracture. 3. No acute fracture or traumatic malalignment of the cervical spine. 4. 6 mm nodule in the right lung apex. In the absence of prior studies for comparison, non-contrast chest CT at 6-12 months is recommended, depending on patient comorbidities given age. Electronically Signed   By: Valetta Mole M.D.   On: 09/26/2022 18:50   CT MAXILLOFACIAL WO CONTRAST  Result Date: 09/26/2022 CLINICAL DATA:  Fall EXAM: CT HEAD WITHOUT CONTRAST CT MAXILLOFACIAL WITHOUT CONTRAST CT CERVICAL SPINE WITHOUT CONTRAST TECHNIQUE: Multidetector CT imaging of the head, cervical spine, and maxillofacial structures were performed using the standard protocol without intravenous contrast. Multiplanar CT image reconstructions of the cervical spine and maxillofacial structures were also generated. RADIATION DOSE REDUCTION: This exam was performed according to the departmental dose-optimization program which includes automated exposure control, adjustment of the mA and/or kV according to patient size and/or use of iterative reconstruction technique. COMPARISON:  CT head  02/14/2022, MR head 04/17/2018 FINDINGS: CT HEAD FINDINGS Brain: There is no acute intracranial hemorrhage, extra-axial fluid collection, or acute infarct. Parenchymal volume is normal for age. The ventricles are normal in size. Gray-white differentiation is preserved. 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 An additional small focus of hypodensity  in the centrum semiovale on the left more posteriorly may reflect background chronic small-vessel ischemic change or interval infarct, though still remote in appearance. There is no mass lesion.  There is no mass effect or midline shift. Vascular: There is calcification of the bilateral carotid siphons. Skull: Normal. Negative for fracture or focal lesion. Other: None. CT MAXILLOFACIAL FINDINGS Osseous: There is no acute facial bone fracture. There is no evidence of mandibular dislocation. There is no suspicious osseous lesion. Orbits: There is significant right periorbital soft tissue swelling with a right frontal scalp hematoma. There is no retrobulbar hematoma. The globe is intact. 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 There is a punctate metallic density foreign body in the left globe, present in 2016. Note that at least 2 brain MRIs were obtained after the CT in 2016 which demonstrated this foreign body. Sinuses: Clear. Soft tissues: Right supraorbital soft tissue swelling and frontal scalp swelling/hematoma as above. CT CERVICAL SPINE FINDINGS Alignment: There is trace anterolisthesis of C4 on C5, C5 on C6, and C7 on T1. There is no jumped or perched facet or other evidence of traumatic malalignment. Skull base and vertebrae: Skull base alignment is maintained. There is no evidence of acute fracture. There is no suspicious osseous lesion. Soft tissues and spinal canal: No prevertebral fluid or swelling. No visible canal hematoma. Disc levels: There  is disc space narrowing and degenerative endplate change most advanced at C2-C3 and C6-C7. Facet arthropathy is most advanced on the left at C3-C4. There is no evidence of high-grade spinal canal stenosis. Upper chest: There is fibrotic change with architectural distortion/scarring in both lung apices. There is a 6 mm nodule in the right lung apex. Other: None. IMPRESSION: 1. No acute intracranial pathology. 2. Right supraorbital and forehead swelling/hematoma without underlying calvarial or facial bone fracture. 3. No acute fracture or traumatic malalignment of the cervical spine. 4. 6 mm nodule in the right lung apex. In the absence of prior studies for comparison, non-contrast chest CT at 6-12 months is recommended, depending on patient comorbidities given age. Electronically Signed   By: Valetta Mole M.D.   On: 09/26/2022 18:50   CT CERVICAL SPINE WO CONTRAST  Result Date: 09/26/2022 CLINICAL DATA:  Fall EXAM: CT HEAD WITHOUT CONTRAST CT MAXILLOFACIAL WITHOUT CONTRAST CT CERVICAL SPINE WITHOUT CONTRAST TECHNIQUE: Multidetector CT imaging of the head, cervical spine, and maxillofacial structures were performed using the standard protocol without intravenous contrast. Multiplanar CT image reconstructions of the cervical spine and maxillofacial structures were also generated. RADIATION DOSE REDUCTION: This exam was performed according to the departmental dose-optimization program which includes automated exposure control, adjustment of the mA and/or kV according to patient size and/or use of iterative reconstruction technique. COMPARISON:  CT head 02/14/2022, MR head 04/17/2018 FINDINGS: CT HEAD FINDINGS Brain: There is no acute intracranial hemorrhage, extra-axial fluid collection, or acute infarct. Parenchymal volume is normal for age. The ventricles are normal in size. Gray-white differentiation is preserved. There is a small remote infarct in the left cerebellar hemisphere and small remote infarct in the  left centrum semiovale, unchanged. An additional small focus of hypodensity in the centrum semiovale on the left more posteriorly may reflect background chronic small-vessel ischemic change or interval infarct, though still remote in appearance. There is no mass lesion.  There is no mass effect or midline shift. Vascular: There is calcification of the bilateral carotid siphons. Skull: Normal. Negative for fracture or focal lesion. Other: None. CT MAXILLOFACIAL FINDINGS Osseous:  There is no acute facial bone fracture. There is no evidence of mandibular dislocation. There is no suspicious osseous lesion. Orbits: There is significant right periorbital soft tissue swelling with a right frontal scalp hematoma. There is no retrobulbar hematoma. The globe is intact. 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 There is a punctate metallic density foreign body in the left globe, present in 2016. Note that at least 2 brain MRIs were obtained after the CT in 2016 which demonstrated this foreign body. Sinuses: Clear. Soft tissues: Right supraorbital soft tissue swelling and frontal scalp swelling/hematoma as above. CT CERVICAL SPINE FINDINGS Alignment: There is trace anterolisthesis of C4 on C5, C5 on C6, and C7 on T1. There is no jumped or perched facet or other evidence of traumatic malalignment. Skull base and vertebrae: Skull base alignment is maintained. There is no evidence of acute fracture. There is no suspicious osseous lesion. Soft tissues and spinal canal: No prevertebral fluid or swelling. No visible canal hematoma. Disc levels: There is disc space narrowing and degenerative endplate change most advanced at C2-C3 and C6-C7. Facet arthropathy is most advanced on the left at C3-C4. There is no evidence of high-grade spinal canal stenosis. Upper chest: There is fibrotic change with architectural distortion/scarring in both lung apices. There is a 6 mm nodule in the right lung apex. Other: None.  IMPRESSION: 1. No acute intracranial pathology. 2. Right supraorbital and forehead swelling/hematoma without underlying calvarial or facial bone fracture. 3. No acute fracture or traumatic malalignment of the cervical spine. 4. 6 mm nodule in the right lung apex. In the absence of prior studies for comparison, non-contrast chest CT at 6-12 months is recommended, depending on patient comorbidities given age. Electronically Signed   By: Valetta Mole M.D.   On: 09/26/2022 18:50   CT PELVIS WO CONTRAST  Result Date: 09/26/2022 CLINICAL DATA:  Fall hip fracture suspected EXAM: CT PELVIS WITHOUT CONTRAST TECHNIQUE: Multidetector CT imaging of the pelvis was performed following the standard protocol without intravenous contrast. RADIATION DOSE REDUCTION: This exam was performed according to the departmental dose-optimization program which includes automated exposure control, adjustment of the mA and/or kV according to patient size and/or use of iterative reconstruction technique. COMPARISON:  Same day radiographs FINDINGS: Urinary Tract:  No abnormality visualized. Bowel:  Unremarkable visualized pelvic bowel loops. Vascular/Lymphatic: No pathologically enlarged lymph nodes. Aortic atherosclerosis. Reproductive:  No mass or other significant abnormality Other:  None. Musculoskeletal: Status post right hip total arthroplasty. Minimally displaced perihardware fracture about the posterior aspect of the intratrochanteric right femur and right femoral arthroplasty component (series 3, image 114, 129). Overlying soft tissue contusion and hematoma. The acetabular component and bony acetabulum appear intact. No other displaced fracture or dislocation of the proximal left femur or bony pelvis. Subacute appearing, partially callused fracture of the left L3 spinous process, partially included (series 3, image 1). Posterior osteopenia. IMPRESSION: 1. Status post right hip total arthroplasty. 2. Minimally displaced perihardware  fracture about the posterior aspect of the intratrochanteric right femur and right femoral arthroplasty component. 3. Overlying soft tissue contusion and hematoma. 4. No other displaced fracture or dislocation of the proximal left femur or bony pelvis. 5. Subacute appearing, partially callused fracture of the left L3 spinous process, partially imaged. 6. Osteopenia, which significantly limits sensitivity for nondisplaced fracture. Aortic Atherosclerosis (ICD10-I70.0). Electronically Signed   By: Delanna Ahmadi M.D.   On: 09/26/2022 18:30   DG Pelvis Portable  Result Date: 09/26/2022 CLINICAL DATA:  [place]: PORTABLE PELVIS 1-2 VIEWS COMPARISON:  Radiographs 02/14/2022 FINDINGS: The right hip components are intact. No dislocation. Suspect a small nondisplaced fracture involving the lesser trochanter. The bony pelvis and left hip are intact. IMPRESSION: Suspect small nondisplaced fracture involving the right lesser trochanter. Electronically Signed   By: Marijo Sanes M.D.   On: 09/26/2022 18:07   DG Femur Min 2 Views Right  Result Date: 09/26/2022 CLINICAL DATA:  Fall, pain EXAM: RIGHT FEMUR 2 VIEWS COMPARISON:  None Available. FINDINGS: Status post right hip total arthroplasty and right knee total arthroplasty. Acute, minimally displaced perihardware fracture of the intratrochanteric right femur about the femoral stem component, best appreciated on cross-table lateral view. No perihardware fracture about the knee. Soft tissues unremarkable. IMPRESSION: 1. Status post right hip total arthroplasty and right knee total arthroplasty. 2. Acute, minimally displaced perihardware fracture of the intratrochanteric right femur about the femoral stem component. 3.  No perihardware fracture about the knee. Electronically Signed   By: Delanna Ahmadi M.D.   On: 09/26/2022 18:06   DG Chest Port 1 View  Result Date: 09/26/2022 CLINICAL DATA:  [place]. EXAM: PORTABLE CHEST 1 VIEW COMPARISON:  04/17/2018 FINDINGS: The  cardiac silhouette, mediastinal and hilar contours are within normal limits and stable. No acute pulmonary findings. No pleural effusion or pneumothorax. The bony thorax is intact. IMPRESSION: No acute cardiopulmonary findings. Electronically Signed   By: Marijo Sanes M.D.   On: 09/26/2022 18:04    Procedures Procedures    Medications Ordered in ED Medications  Tdap (BOOSTRIX) injection 0.5 mL (0.5 mLs Intramuscular Given 09/26/22 1814)  HYDROmorphone (DILAUDID) injection 1 mg (1 mg Intravenous Given 09/26/22 2021)    ED Course/ Medical Decision Making/ A&P                           Medical Decision Making ADRON GEISEL is a 87 y.o. male here presenting with fall.  Patient is on Plavix for previous stroke.  Level 2 trauma was activated.  Plan to get CT head neck and face.  Also get chest and pelvis x-ray.  9:43 PM Reviewed patient's labs and independently interpreted imaging studies.  CT head neck and face did not show any fractures.  Pelvic x-ray showed possible periprosthetic fracture. CT confirmed that periprosthetic fracture.  I discussed case with Dr. Rhona Raider from [facility] ortho (Dr. Mayer Camel did the surgery initially).  He recommend PT and OT and he will see patient as consult tomorrow.  Recommend hospitalist admission for pain control.   Problems Addressed: Periprosthetic fracture around internal prosthetic right hip joint, initial encounter [facility]): acute illness or injury  Amount and/or Complexity of Data Reviewed Labs: ordered. Decision-making details documented in ED Course. Radiology: ordered and independent interpretation performed. Decision-making details documented in ED Course.  Risk Prescription drug management. Decision regarding hospitalization.    Final Clinical Impression(s) / ED Diagnoses Final diagnoses:  None    Rx / DC Orders ED Discharge Orders     None         Drenda Freeze, MD 09/26/22 2149

## 2022-09-26 NOTE — ED Triage Notes (Signed)
Arrives via EMS with mechanical fall on concrete, + head strike, on thinners. No LOC, AOx4, Displaced R hip, collar in place, + CMS on right leg

## 2022-09-27 ENCOUNTER — Observation Stay (HOSPITAL_BASED_OUTPATIENT_CLINIC_OR_DEPARTMENT_OTHER): Payer: Medicare Other

## 2022-09-27 DIAGNOSIS — G9341 Metabolic encephalopathy: Secondary | ICD-10-CM | POA: Diagnosis not present

## 2022-09-27 DIAGNOSIS — S7001XD Contusion of right hip, subsequent encounter: Secondary | ICD-10-CM | POA: Diagnosis not present

## 2022-09-27 DIAGNOSIS — F05 Delirium due to known physiological condition: Secondary | ICD-10-CM | POA: Diagnosis not present

## 2022-09-27 DIAGNOSIS — R911 Solitary pulmonary nodule: Secondary | ICD-10-CM | POA: Diagnosis present

## 2022-09-27 DIAGNOSIS — E872 Acidosis, unspecified: Secondary | ICD-10-CM

## 2022-09-27 DIAGNOSIS — S0083XA Contusion of other part of head, initial encounter: Secondary | ICD-10-CM | POA: Diagnosis present

## 2022-09-27 DIAGNOSIS — M069 Rheumatoid arthritis, unspecified: Secondary | ICD-10-CM | POA: Diagnosis present

## 2022-09-27 DIAGNOSIS — I6389 Other cerebral infarction: Secondary | ICD-10-CM | POA: Diagnosis not present

## 2022-09-27 DIAGNOSIS — H409 Unspecified glaucoma: Secondary | ICD-10-CM | POA: Diagnosis present

## 2022-09-27 DIAGNOSIS — S72009A Fracture of unspecified part of neck of unspecified femur, initial encounter for closed fracture: Secondary | ICD-10-CM | POA: Diagnosis present

## 2022-09-27 DIAGNOSIS — I5021 Acute systolic (congestive) heart failure: Secondary | ICD-10-CM

## 2022-09-27 DIAGNOSIS — I5042 Chronic combined systolic (congestive) and diastolic (congestive) heart failure: Secondary | ICD-10-CM

## 2022-09-27 DIAGNOSIS — R29711 NIHSS score 11: Secondary | ICD-10-CM | POA: Diagnosis not present

## 2022-09-27 DIAGNOSIS — I6789 Other cerebrovascular disease: Secondary | ICD-10-CM | POA: Diagnosis present

## 2022-09-27 DIAGNOSIS — I639 Cerebral infarction, unspecified: Secondary | ICD-10-CM | POA: Diagnosis not present

## 2022-09-27 DIAGNOSIS — N400 Enlarged prostate without lower urinary tract symptoms: Secondary | ICD-10-CM | POA: Diagnosis present

## 2022-09-27 DIAGNOSIS — Z96651 Presence of right artificial knee joint: Secondary | ICD-10-CM | POA: Diagnosis present

## 2022-09-27 DIAGNOSIS — S32039A Unspecified fracture of third lumbar vertebra, initial encounter for closed fracture: Secondary | ICD-10-CM | POA: Diagnosis present

## 2022-09-27 DIAGNOSIS — Z79899 Other long term (current) drug therapy: Secondary | ICD-10-CM | POA: Diagnosis not present

## 2022-09-27 DIAGNOSIS — I672 Cerebral atherosclerosis: Secondary | ICD-10-CM | POA: Diagnosis present

## 2022-09-27 DIAGNOSIS — M978XXA Periprosthetic fracture around other internal prosthetic joint, initial encounter: Secondary | ICD-10-CM | POA: Diagnosis present

## 2022-09-27 DIAGNOSIS — W1830XA Fall on same level, unspecified, initial encounter: Secondary | ICD-10-CM | POA: Diagnosis not present

## 2022-09-27 DIAGNOSIS — I63312 Cerebral infarction due to thrombosis of left middle cerebral artery: Secondary | ICD-10-CM | POA: Diagnosis not present

## 2022-09-27 DIAGNOSIS — G8929 Other chronic pain: Secondary | ICD-10-CM | POA: Diagnosis present

## 2022-09-27 DIAGNOSIS — G459 Transient cerebral ischemic attack, unspecified: Secondary | ICD-10-CM | POA: Diagnosis not present

## 2022-09-27 DIAGNOSIS — Y93H2 Activity, gardening and landscaping: Secondary | ICD-10-CM | POA: Diagnosis not present

## 2022-09-27 DIAGNOSIS — K219 Gastro-esophageal reflux disease without esophagitis: Secondary | ICD-10-CM | POA: Diagnosis present

## 2022-09-27 DIAGNOSIS — R3129 Other microscopic hematuria: Secondary | ICD-10-CM

## 2022-09-27 DIAGNOSIS — I110 Hypertensive heart disease with heart failure: Secondary | ICD-10-CM | POA: Diagnosis present

## 2022-09-27 DIAGNOSIS — E876 Hypokalemia: Secondary | ICD-10-CM | POA: Diagnosis present

## 2022-09-27 DIAGNOSIS — Z96649 Presence of unspecified artificial hip joint: Secondary | ICD-10-CM | POA: Diagnosis not present

## 2022-09-27 DIAGNOSIS — I6330 Cerebral infarction due to thrombosis of unspecified cerebral artery: Secondary | ICD-10-CM | POA: Diagnosis not present

## 2022-09-27 DIAGNOSIS — I5022 Chronic systolic (congestive) heart failure: Secondary | ICD-10-CM | POA: Diagnosis not present

## 2022-09-27 DIAGNOSIS — Z23 Encounter for immunization: Secondary | ICD-10-CM | POA: Diagnosis present

## 2022-09-27 DIAGNOSIS — M978XXD Periprosthetic fracture around other internal prosthetic joint, subsequent encounter: Secondary | ICD-10-CM | POA: Diagnosis not present

## 2022-09-27 DIAGNOSIS — H5442A5 Blindness left eye category 5, normal vision right eye: Secondary | ICD-10-CM | POA: Diagnosis present

## 2022-09-27 DIAGNOSIS — D72829 Elevated white blood cell count, unspecified: Secondary | ICD-10-CM | POA: Diagnosis not present

## 2022-09-27 DIAGNOSIS — M9701XA Periprosthetic fracture around internal prosthetic right hip joint, initial encounter: Secondary | ICD-10-CM | POA: Diagnosis present

## 2022-09-27 DIAGNOSIS — M1990 Unspecified osteoarthritis, unspecified site: Secondary | ICD-10-CM | POA: Diagnosis present

## 2022-09-27 LAB — CBC
HCT: 39.2 % (ref 39.0–52.0)
Hemoglobin: 12.7 g/dL — ABNORMAL LOW (ref 13.0–17.0)
MCH: 34.1 pg — ABNORMAL HIGH (ref 26.0–34.0)
MCHC: 32.4 g/dL (ref 30.0–36.0)
MCV: 105.4 fL — ABNORMAL HIGH (ref 80.0–100.0)
Platelets: 143 10*3/uL — ABNORMAL LOW (ref 150–400)
RBC: 3.72 MIL/uL — ABNORMAL LOW (ref 4.22–5.81)
RDW: 14.5 % (ref 11.5–15.5)
WBC: 8.1 10*3/uL (ref 4.0–10.5)
nRBC: 0 % (ref 0.0–0.2)

## 2022-09-27 LAB — ECHOCARDIOGRAM COMPLETE
AR max vel: 2.57 cm2
AV Area VTI: 2.52 cm2
AV Area mean vel: 2.53 cm2
AV Mean grad: 2 mmHg
AV Peak grad: 4.8 mmHg
Ao pk vel: 1.09 m/s
Area-P 1/2: 2.73 cm2
Height: 70 in
Weight: 2592 oz

## 2022-09-27 LAB — BASIC METABOLIC PANEL
Anion gap: 9 (ref 5–15)
BUN: 6 mg/dL — ABNORMAL LOW (ref 8–23)
CO2: 25 mmol/L (ref 22–32)
Calcium: 9.2 mg/dL (ref 8.9–10.3)
Chloride: 110 mmol/L (ref 98–111)
Creatinine, Ser: 0.83 mg/dL (ref 0.61–1.24)
GFR, Estimated: >60 mL/min (ref >60)
Glucose, Bld: 112 mg/dL — ABNORMAL HIGH (ref 70–99)
Potassium: 3.4 mmol/L — ABNORMAL LOW (ref 3.5–5.1)
Sodium: 144 mmol/L (ref 135–145)

## 2022-09-27 LAB — MAGNESIUM: Magnesium: 1.8 mg/dL (ref 1.7–2.4)

## 2022-09-27 MED ORDER — VITAMIN B-12 1000 MCG PO TABS
1000.0000 ug | ORAL_TABLET | Freq: Every day | ORAL | Status: DC
  Administered 2022-09-27 – 2022-10-01 (×5): 1000 ug via ORAL
  Filled 2022-09-27 (×5): qty 1

## 2022-09-27 MED ORDER — TURMERIC 500 MG PO CAPS: ORAL_CAPSULE | Freq: Every day | ORAL | Status: DC

## 2022-09-27 MED ORDER — FOLIC ACID 1 MG PO TABS
1.0000 mg | ORAL_TABLET | Freq: Every day | ORAL | Status: DC
  Administered 2022-09-27 – 2022-10-01 (×5): 1 mg via ORAL
  Filled 2022-09-27 (×5): qty 1

## 2022-09-27 MED ORDER — POTASSIUM CHLORIDE CRYS ER 20 MEQ PO TBCR
40.0000 meq | EXTENDED_RELEASE_TABLET | Freq: Once | ORAL | Status: AC
  Administered 2022-09-27: 40 meq via ORAL
  Filled 2022-09-27: qty 2

## 2022-09-27 MED ORDER — TRAZODONE HCL 50 MG PO TABS
150.0000 mg | ORAL_TABLET | Freq: Every day | ORAL | Status: DC
  Administered 2022-09-27 – 2022-09-28 (×2): 150 mg via ORAL
  Filled 2022-09-27 (×2): qty 3

## 2022-09-27 MED ORDER — HYDROCODONE-ACETAMINOPHEN 5-325 MG PO TABS
1.0000 | ORAL_TABLET | Freq: Four times a day (QID) | ORAL | Status: DC | PRN
  Administered 2022-09-27 – 2022-09-28 (×4): 1 via ORAL
  Administered 2022-10-01: 2 via ORAL
  Filled 2022-09-27: qty 1
  Filled 2022-09-27: qty 2
  Filled 2022-09-27: qty 1
  Filled 2022-09-27: qty 2
  Filled 2022-09-27: qty 1

## 2022-09-27 MED ORDER — BACLOFEN 10 MG PO TABS
10.0000 mg | ORAL_TABLET | Freq: Four times a day (QID) | ORAL | Status: DC | PRN
  Administered 2022-09-27: 10 mg via ORAL
  Filled 2022-09-27: qty 1

## 2022-09-27 MED ORDER — FERROUS SULFATE 325 (65 FE) MG PO TABS
325.0000 mg | ORAL_TABLET | Freq: Every day | ORAL | Status: DC
  Administered 2022-09-28 – 2022-10-01 (×4): 325 mg via ORAL
  Filled 2022-09-27 (×4): qty 1

## 2022-09-27 MED ORDER — VITAMIN C 500 MG PO TABS
500.0000 mg | ORAL_TABLET | Freq: Every day | ORAL | Status: DC
  Administered 2022-09-27 – 2022-10-01 (×5): 500 mg via ORAL
  Filled 2022-09-27 (×5): qty 1

## 2022-09-27 NOTE — Progress Notes (Incomplete)
Echocardiogram 2D Echocardiogram has been performed.  Ronny Flurry 09/27/2022, 11:55 AM

## 2022-09-27 NOTE — Progress Notes (Signed)
PROGRESS NOTE  Nathaniel [name] BVQ:[id number] DOB: 1935/04/18 DOA: 09/26/2022 PCP: Shirline Frees, MD   LOS: 0 days   Brief Narrative / Interim history: 87 year old male with HTN, RA, left eye blindness, chronic combined CHF, prior GI bleed, PAD on Plavix, history of right hip total arthroplasty comes into the hospital after a mechanical fall.  He was out in the yard blowing leaves, 1 hand was on the walker and the other 1 on the leaf blower, when he lost balance, fell to the ground and hit his head.  He came to the ED and imaging of the hip showed a periprosthetic fracture with soft tissue contusion and hematoma, and CT head without acute intracranial pathologies.  Orthopedic surgery consulted  Subjective / 24h Interval events: He is doing well this morning, complains of some pain in his hip but not too bad  Assesement and Plan: Principal Problem:   Peri-prosthetic fracture around prosthetic hip Active Problems:   Rheumatoid arthritis (HCC)   Chronic combined systolic and diastolic CHF (congestive heart failure) (HCC)   Essential hypertension   Hypokalemia   Hematoma of right hip   Traumatic hematoma of forehead   Fracture of spinous process of lumbar vertebra (HCC)   Incidental pulmonary nodule   Metabolic acidosis   Microscopic hematuria   Principal problem Right hip periprosthetic fracture-orthopedic surgery consulted, Dr. Rhona Raider saw patient on 11/25, and fracture looks stable and hardware does not look compromised.  Recommends nonoperative management. -Mobilize with PT/OT, looks like they recommend SNF  Active problems Right hip hematoma -Hold Plavix at this time and monitor H&H, have asked orthopedic surgery to weigh in when Plavix could be resumed. Avoid anticoagulation for DVT prophylaxis   Right supraorbital and forehead swelling/hematoma -No underlying calvarial or facial bone fracture seen on CT.   Subacute left L3 spinous process fracture -he denies back pain    Incidental pulmonary nodule -CT showing a 6 mm nodule in the right lung apex.  Patient is a former smoker.  Follow-up with CT chest as an outpatient in 6 to 12 months per radiology   Mild hypokalemia -continue to replace as indicated, monitor   Mild normal anion gap metabolic acidosis -resolved   Microscopic hematuria-asymptomatic.  Outpatient follow-up   Hypertension -continue amlodipine  Rheumatoid arthritis-On weekly Actemra and methotrexate, continue prednisone and sulfasalazine   Chronic combined CHF -No signs of volume overload.  Echo done June 2016 showing LVEF 40 to 80%, grade 1 diastolic dysfunction, and trivial AVR. Repeat echocardiogram ordered by admitting MD, currently pending   Peripheral arterial disease -Hold Plavix at this time given fall and hematomas   BPH -Continue Flomax  Scheduled Meds:  amLODipine  10 mg Oral Daily   predniSONE  5 mg Oral BID WC   sulfaSALAzine  1,000 mg Oral BID WC   tamsulosin  0.4 mg Oral QHS   Continuous Infusions: PRN Meds:.morphine injection, naLOXone (NARCAN)  injection  Current Outpatient Medications  Medication Instructions   Actemra ACTPen 162 mg, Subcutaneous, Every Thu   amLODipine (NORVASC) 10 mg, Oral, Daily   ascorbic acid (VITAMIN C) 500 mg, Oral, Daily   baclofen (LIORESAL) 10 mg, Oral, 2 times daily   brimonidine (ALPHAGAN) 0.2 % ophthalmic solution 1 drop, Both Eyes, 2 times daily   cetirizine (ZYRTEC) 10 mg, Oral, Daily   clopidogrel (PLAVIX) 75 mg, Oral, Daily   cyanocobalamin (VITAMIN B12) 1,000 mcg, Oral, Daily   diclofenac sodium (VOLTAREN) 1 % GEL 1 application , Topical, 2 times daily,  Left knee and both feet   dorzolamide (TRUSOPT) 2 % ophthalmic solution 1 drop, Both Eyes, 2 times daily   ferrous sulfate 325 mg, Oral, Daily with breakfast   Fish Oil 1,000 mg, Oral, Daily   folic acid (FOLVITE) 1 mg, Oral, Daily at bedtime   gabapentin (NEURONTIN) 800 mg, Oral, 4 times daily   HYDROcodone-acetaminophen  (NORCO/VICODIN) 5-325 MG tablet 1 tablet, Oral, 3 times daily   methotrexate 25 mg, Subcutaneous, Every Wed   polyethylene glycol (MIRALAX / GLYCOLAX) 17 g, Oral, Nightly   predniSONE (DELTASONE) 5 mg, Oral, 2 times daily   sulfaSALAzine (AZULFIDINE) 1,000 mg, Oral, 2 times daily with breakfast and lunch   tamsulosin (FLOMAX) 0.4 mg, Oral, Nightly   traZODone (DESYREL) 150 mg, Oral, Daily at bedtime   TURMERIC PO 1 capsule, Oral, Daily    Diet Orders (From admission, onward)     Start     Ordered   09/27/22 0816  Diet regular Room service appropriate? Yes; Fluid consistency: Thin  Diet effective now       Question Answer Comment  Room service appropriate? Yes   Fluid consistency: Thin      09/27/22 0815            DVT prophylaxis: SCDs Start: 09/26/22 2318   Lab Results  Component Value Date   PLT 143 (L) 09/27/2022      Code Status: Full Code  Family Communication: no family at bedside   Status is: Observation The patient will require care spanning > 2 midnights and should be moved to inpatient because: Unsafe discharge due to SNF needs   Level of care: Med-Surg  Consultants:  Orthopedic surgery  Objective: Vitals:   09/26/22 2321 09/27/22 0447 09/27/22 0648 09/27/22 0833  BP: (!) 157/91 111/71 128/75 138/80  Pulse: 77  68 (!) 103  Resp: 14   18  Temp: 97.8 F (36.6 C) 98 F (36.7 C) 97.6 F (36.4 C) 98.6 F (37 C)  TempSrc: Oral Oral Oral Oral  SpO2: 96%   96%  Weight:      Height:        Intake/Output Summary (Last 24 hours) at 09/27/2022 1029 Last data filed at 09/27/2022 0645 Gross per 24 hour  Intake 0 ml  Output 200 ml  Net -200 ml   Wt Readings from Last 3 Encounters:  09/26/22 73.5 kg  04/24/19 85.1 kg  04/19/15 81.6 kg    Examination:  Constitutional: NAD Eyes: no scleral icterus ENMT: Mucous membranes are moist.  Neck: normal, supple Respiratory: clear to auscultation bilaterally, no wheezing, no crackles. Normal  respiratory effort. No accessory muscle use.  Cardiovascular: Regular rate and rhythm, no murmurs / rubs / gallops. No LE edema.  Abdomen: non distended, no tenderness. Bowel sounds positive.  Musculoskeletal: no clubbing / cyanosis.   Data Reviewed: I have independently reviewed following labs and imaging studies   CBC Recent Labs  Lab 09/26/22 1739 09/26/22 1749 09/27/22 0315  WBC 8.9  --  8.1  HGB 13.6 13.6 12.7*  HCT 43.9 40.0 39.2  PLT 151  --  143*  MCV 109.2*  --  105.4*  MCH 33.8  --  34.1*  MCHC 31.0  --  32.4  RDW 14.5  --  14.5    Recent Labs  Lab 09/26/22 1739 09/26/22 1749 09/27/22 0315  NA 143 141 144  K 3.5 3.3* 3.4*  CL 109 108 110  CO2 20*  --  25  GLUCOSE 103*  102* 112*  BUN 8 7* 6*  CREATININE 1.10 1.00 0.83  CALCIUM 9.4  --  9.2  AST 24  --   --   ALT 14  --   --   ALKPHOS 71  --   --   BILITOT 0.4  --   --   ALBUMIN 3.9  --   --   MG  --   --  1.8    ------------------------------------------------------------------------------------------------------------------ No results for input(s): "CHOL", "HDL", "LDLCALC", "TRIG", "CHOLHDL", "LDLDIRECT" in the last 72 hours.  Lab Results  Component Value Date   HGBA1C 6.3 (H) 04/20/2015   ------------------------------------------------------------------------------------------------------------------ No results for input(s): "TSH", "T4TOTAL", "T3FREE", "THYROIDAB" in the last 72 hours.  Invalid input(s): "FREET3"  Cardiac Enzymes No results for input(s): "CKMB", "TROPONINI", "MYOGLOBIN" in the last 168 hours.  Invalid input(s): "CK" ------------------------------------------------------------------------------------------------------------------    Component Value Date/Time   BNP 63.6 02/16/2022 0147    CBG: No results for input(s): "GLUCAP" in the last 168 hours.  No results found for this or any previous visit (from the past 240 hour(s)).   Radiology Studies: CT HEAD WO  CONTRAST  Result Date: 09/26/2022 CLINICAL DATA:  Fall EXAM: CT HEAD WITHOUT CONTRAST CT MAXILLOFACIAL WITHOUT CONTRAST CT CERVICAL SPINE WITHOUT CONTRAST TECHNIQUE: Multidetector CT imaging of the head, cervical spine, and maxillofacial structures were performed using the standard protocol without intravenous contrast. Multiplanar CT image reconstructions of the cervical spine and maxillofacial structures were also generated. RADIATION DOSE REDUCTION: This exam was performed according to the departmental dose-optimization program which includes automated exposure control, adjustment of the mA and/or kV according to patient size and/or use of iterative reconstruction technique. COMPARISON:  CT head 02/14/2022, MR head 04/17/2018 FINDINGS: CT HEAD FINDINGS Brain: There is no acute intracranial hemorrhage, extra-axial fluid collection, or acute infarct. Parenchymal volume is normal for age. The ventricles are normal in size. Gray-white differentiation is preserved. 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 An additional small focus of hypodensity in the centrum semiovale on the left more posteriorly may reflect background chronic small-vessel ischemic change or interval infarct, though still remote in appearance. There is no mass lesion.  There is no mass effect or midline shift. Vascular: There is calcification of the bilateral carotid siphons. Skull: Normal. Negative for fracture or focal lesion. Other: None. CT MAXILLOFACIAL FINDINGS Osseous: There is no acute facial bone fracture. There is no evidence of mandibular dislocation. There is no suspicious osseous lesion. Orbits: There is significant right periorbital soft tissue swelling with a right frontal scalp hematoma. There is no retrobulbar hematoma. The globe is intact. 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 There is a punctate metallic density  foreign body in the left globe, present in 2016. Note that at least 2 brain MRIs were obtained after the CT in 2016 which demonstrated this foreign body. Sinuses: Clear. Soft tissues: Right supraorbital soft tissue swelling and frontal scalp swelling/hematoma as above. CT CERVICAL SPINE FINDINGS Alignment: There is trace anterolisthesis of C4 on C5, C5 on C6, and C7 on T1. There is no jumped or perched facet or other evidence of traumatic malalignment. Skull base and vertebrae: Skull base alignment is maintained. There is no evidence of acute fracture. There is no suspicious osseous lesion. Soft tissues and spinal canal: No prevertebral fluid or swelling. No visible canal hematoma. Disc levels: There is disc space narrowing and degenerative endplate change  most advanced at C2-C3 and C6-C7. Facet arthropathy is most advanced on the left at C3-C4. There is no evidence of high-grade spinal canal stenosis. Upper chest: There is fibrotic change with architectural distortion/scarring in both lung apices. There is a 6 mm nodule in the right lung apex. Other: None. IMPRESSION: 1. No acute intracranial pathology. 2. Right supraorbital and forehead swelling/hematoma without underlying calvarial or facial bone fracture. 3. No acute fracture or traumatic malalignment of the cervical spine. 4. 6 mm nodule in the right lung apex. In the absence of prior studies for comparison, non-contrast chest CT at 6-12 months is recommended, depending on patient comorbidities given age. Electronically Signed   By: Valetta Mole M.D.   On: 09/26/2022 18:50   CT MAXILLOFACIAL WO CONTRAST  Result Date: 09/26/2022 CLINICAL DATA:  Fall EXAM: CT HEAD WITHOUT CONTRAST CT MAXILLOFACIAL WITHOUT CONTRAST CT CERVICAL SPINE WITHOUT CONTRAST TECHNIQUE: Multidetector CT imaging of the head, cervical spine, and maxillofacial structures were performed using the standard protocol without intravenous contrast. Multiplanar CT image reconstructions of the  cervical spine and maxillofacial structures were also generated. RADIATION DOSE REDUCTION: This exam was performed according to the departmental dose-optimization program which includes automated exposure control, adjustment of the mA and/or kV according to patient size and/or use of iterative reconstruction technique. COMPARISON:  CT head 02/14/2022, MR head 04/17/2018 FINDINGS: CT HEAD FINDINGS Brain: There is no acute intracranial hemorrhage, extra-axial fluid collection, or acute infarct. Parenchymal volume is normal for age. The ventricles are normal in size. Gray-white differentiation is preserved. 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 An additional small focus of hypodensity in the centrum semiovale on the left more posteriorly may reflect background chronic small-vessel ischemic change or interval infarct, though still remote in appearance. There is no mass lesion.  There is no mass effect or midline shift. Vascular: There is calcification of the bilateral carotid siphons. Skull: Normal. Negative for fracture or focal lesion. Other: None. CT MAXILLOFACIAL FINDINGS Osseous: There is no acute facial bone fracture. There is no evidence of mandibular dislocation. There is no suspicious osseous lesion. Orbits: There is significant right periorbital soft tissue swelling with a right frontal scalp hematoma. There is no retrobulbar hematoma. The globe is intact. 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 There is a punctate metallic density foreign body in the left globe, present in 2016. Note that at least 2 brain MRIs were obtained after the CT in 2016 which demonstrated this foreign body. Sinuses: Clear. Soft tissues: Right supraorbital soft tissue swelling and frontal scalp swelling/hematoma as above. CT CERVICAL SPINE FINDINGS Alignment: There is trace anterolisthesis of C4 on C5, C5 on C6, and C7  on T1. There is no jumped or perched facet or other evidence of traumatic malalignment. Skull base and vertebrae: Skull base alignment is maintained. There is no evidence of acute fracture. There is no suspicious osseous lesion. Soft tissues and spinal canal: No prevertebral fluid or swelling. No visible canal hematoma. Disc levels: There is disc space narrowing and degenerative endplate change most advanced at C2-C3 and C6-C7. Facet arthropathy is most advanced on the left at C3-C4. There is no evidence of high-grade spinal canal stenosis. Upper chest: There is fibrotic change with architectural distortion/scarring in both lung apices. There is a 6 mm nodule in the right lung apex. Other: None. IMPRESSION: 1. No acute intracranial pathology. 2. Right supraorbital and forehead  swelling/hematoma without underlying calvarial or facial bone fracture. 3. No acute fracture or traumatic malalignment of the cervical spine. 4. 6 mm nodule in the right lung apex. In the absence of prior studies for comparison, non-contrast chest CT at 6-12 months is recommended, depending on patient comorbidities given age. Electronically Signed   By: Valetta Mole M.D.   On: 09/26/2022 18:50   CT CERVICAL SPINE WO CONTRAST  Result Date: 09/26/2022 CLINICAL DATA:  Fall EXAM: CT HEAD WITHOUT CONTRAST CT MAXILLOFACIAL WITHOUT CONTRAST CT CERVICAL SPINE WITHOUT CONTRAST TECHNIQUE: Multidetector CT imaging of the head, cervical spine, and maxillofacial structures were performed using the standard protocol without intravenous contrast. Multiplanar CT image reconstructions of the cervical spine and maxillofacial structures were also generated. RADIATION DOSE REDUCTION: This exam was performed according to the departmental dose-optimization program which includes automated exposure control, adjustment of the mA and/or kV according to patient size and/or use of iterative reconstruction technique. COMPARISON:  CT head 02/14/2022, MR head  04/17/2018 FINDINGS: CT HEAD FINDINGS Brain: There is no acute intracranial hemorrhage, extra-axial fluid collection, or acute infarct. Parenchymal volume is normal for age. The ventricles are normal in size. Gray-white differentiation is preserved. 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 An additional small focus of hypodensity in the centrum semiovale on the left more posteriorly may reflect background chronic small-vessel ischemic change or interval infarct, though still remote in appearance. There is no mass lesion.  There is no mass effect or midline shift. Vascular: There is calcification of the bilateral carotid siphons. Skull: Normal. Negative for fracture or focal lesion. Other: None. CT MAXILLOFACIAL FINDINGS Osseous: There is no acute facial bone fracture. There is no evidence of mandibular dislocation. There is no suspicious osseous lesion. Orbits: There is significant right periorbital soft tissue swelling with a right frontal scalp hematoma. There is no retrobulbar hematoma. The globe is intact. 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 There is a punctate metallic density foreign body in the left globe, present in 2016. Note that at least 2 brain MRIs were obtained after the CT in 2016 which demonstrated this foreign body. Sinuses: Clear. Soft tissues: Right supraorbital soft tissue swelling and frontal scalp swelling/hematoma as above. CT CERVICAL SPINE FINDINGS Alignment: There is trace anterolisthesis of C4 on C5, C5 on C6, and C7 on T1. There is no jumped or perched facet or other evidence of traumatic malalignment. Skull base and vertebrae: Skull base alignment is maintained. There is no evidence of acute fracture. There is no suspicious osseous lesion. Soft tissues and spinal canal: No prevertebral fluid or swelling. No visible canal hematoma. Disc levels: There is disc space  narrowing and degenerative endplate change most advanced at C2-C3 and C6-C7. Facet arthropathy is most advanced on the left at C3-C4. There is no evidence of high-grade spinal canal stenosis. Upper chest: There is fibrotic change with architectural distortion/scarring in both lung apices. There is a 6 mm nodule in the right lung apex. Other: None. IMPRESSION: 1. No acute intracranial pathology. 2. Right supraorbital and forehead swelling/hematoma without underlying calvarial or facial bone fracture. 3. No acute fracture or traumatic malalignment of the cervical spine. 4. 6 mm nodule in the right lung apex. In the absence of prior studies for comparison, non-contrast chest CT at 6-12 months is recommended, depending on patient comorbidities given age. Electronically Signed   By: Court Joy.D.  On: 09/26/2022 18:50   CT PELVIS WO CONTRAST  Result Date: 09/26/2022 CLINICAL DATA:  Fall hip fracture suspected EXAM: CT PELVIS WITHOUT CONTRAST TECHNIQUE: Multidetector CT imaging of the pelvis was performed following the standard protocol without intravenous contrast. RADIATION DOSE REDUCTION: This exam was performed according to the departmental dose-optimization program which includes automated exposure control, adjustment of the mA and/or kV according to patient size and/or use of iterative reconstruction technique. COMPARISON:  Same day radiographs FINDINGS: Urinary Tract:  No abnormality visualized. Bowel:  Unremarkable visualized pelvic bowel loops. Vascular/Lymphatic: No pathologically enlarged lymph nodes. Aortic atherosclerosis. Reproductive:  No mass or other significant abnormality Other:  None. Musculoskeletal: Status post right hip total arthroplasty. Minimally displaced perihardware fracture about the posterior aspect of the intratrochanteric right femur and right femoral arthroplasty component (series 3, image 114, 129). Overlying soft tissue contusion and hematoma. The acetabular component and bony  acetabulum appear intact. No other displaced fracture or dislocation of the proximal left femur or bony pelvis. Subacute appearing, partially callused fracture of the left L3 spinous process, partially included (series 3, image 1). Posterior osteopenia. IMPRESSION: 1. Status post right hip total arthroplasty. 2. Minimally displaced perihardware fracture about the posterior aspect of the intratrochanteric right femur and right femoral arthroplasty component. 3. Overlying soft tissue contusion and hematoma. 4. No other displaced fracture or dislocation of the proximal left femur or bony pelvis. 5. Subacute appearing, partially callused fracture of the left L3 spinous process, partially imaged. 6. Osteopenia, which significantly limits sensitivity for nondisplaced fracture. Aortic Atherosclerosis (ICD10-I70.0). Electronically Signed   By: Delanna Ahmadi M.D.   On: 09/26/2022 18:30   DG Pelvis Portable  Result Date: 09/26/2022 CLINICAL DATA:  [place]. EXAM: PORTABLE PELVIS 1-2 VIEWS COMPARISON:  Radiographs 02/14/2022 FINDINGS: The right hip components are intact. No dislocation. Suspect a small nondisplaced fracture involving the lesser trochanter. The bony pelvis and left hip are intact. IMPRESSION: Suspect small nondisplaced fracture involving the right lesser trochanter. Electronically Signed   By: Marijo Sanes M.D.   On: 09/26/2022 18:07   DG Femur Min 2 Views Right  Result Date: 09/26/2022 CLINICAL DATA:  Fall, pain EXAM: RIGHT FEMUR 2 VIEWS COMPARISON:  None Available. FINDINGS: Status post right hip total arthroplasty and right knee total arthroplasty. Acute, minimally displaced perihardware fracture of the intratrochanteric right femur about the femoral stem component, best appreciated on cross-table lateral view. No perihardware fracture about the knee. Soft tissues unremarkable. IMPRESSION: 1. Status post right hip total arthroplasty and right knee total arthroplasty. 2. Acute, minimally displaced  perihardware fracture of the intratrochanteric right femur about the femoral stem component. 3.  No perihardware fracture about the knee. Electronically Signed   By: Delanna Ahmadi M.D.   On: 09/26/2022 18:06   DG Chest Port 1 View  Result Date: 09/26/2022 CLINICAL DATA:  [place]. EXAM: PORTABLE CHEST 1 VIEW COMPARISON:  04/17/2018 FINDINGS: The cardiac silhouette, mediastinal and hilar contours are within normal limits and stable. No acute pulmonary findings. No pleural effusion or pneumothorax. The bony thorax is intact. IMPRESSION: No acute cardiopulmonary findings. Electronically Signed   By: Marijo Sanes M.D.   On: 09/26/2022 18:04     Marzetta Board, MD, PhD [facility]  Between 7 am - 7 pm I am available, please contact me via Amion (for emergencies) or Securechat (non urgent messages)  Between 7 pm - 7 am I am not available, please contact night coverage MD/APP via Amion

## 2022-09-27 NOTE — Evaluation (Signed)
Physical Therapy Evaluation Patient Details Name: Nathaniel [name] MRN: [id number] DOB: 1935-06-03 Today's Date: 09/27/2022  History of Present Illness  Pt is a 87 yr old male who presented 11/24 due to a fall at home while using a leaf blower and holding onto his walker with one hand. CT of pelvis showed minimal displaced perihardware fx  and CT of head showed hematoma of head without fx. PMH: hypertension, GERD, rheumatoid arthritis, chronic pain, glaucoma with left eye blindness, chronic combined CHF, history of GI bleed, Rt THA x3, TKA.    Clinical Impression  Nathaniel [name] is 87 y.o. male admitted with above HPI and diagnosis. Patient is currently limited by functional impairments below (see PT problem list). Patient lives with his wife and is mod independent with electric wheelchair in home and RW out of home at baseline. Pt has a history of falling and spouse assists with some ADL's. HE is limited by pain at Rt LE/hip today. Able to pivot to EOB part way but limited by pain. Patient will benefit from continued skilled PT interventions to address impairments and progress independence with mobility, recommending ST rehab at [facility]. Acute PT will follow and progress as able.        Recommendations for follow up therapy are one component of a multi-disciplinary discharge planning process, led by the attending physician.  Recommendations may be updated based on patient status, additional functional criteria and insurance authorization.  Follow Up Recommendations Skilled nursing-short term rehab (<3 hours/day) Can patient physically be transported by private vehicle: No    Assistance Recommended at Discharge Frequent or constant Supervision/Assistance  Patient can return home with the following  Two people to help with walking and/or transfers;Two people to help with bathing/dressing/bathroom;Direct supervision/assist for medications management;Assist for transportation;Help with stairs or ramp for  entrance    Equipment Recommendations None recommended by PT  Recommendations for Other Services       Functional Status Assessment Patient has had a recent decline in their functional status and demonstrates the ability to make significant improvements in function in a reasonable and predictable amount of time.     Precautions / Restrictions Precautions Precautions: Fall (Pt reported 2-3 falls and does not know when B knees may give out) Restrictions Weight Bearing Restrictions: Yes RLE Weight Bearing: Weight bearing as tolerated      Mobility  Bed Mobility Overal bed mobility: Needs Assistance Bed Mobility: Supine to Sit, Sit to Supine     Supine to sit: Mod assist, Max assist, HOB elevated Sit to supine: Mod assist, Max assist   General bed mobility comments: assist due to limited ROM and difficulty reaching to bed rail. cues to sequence and assist to bring LE's off/onto EOB.    Transfers                        Ambulation/Gait                  Stairs            Wheelchair Mobility    Modified Rankin (Stroke Patients Only)       Balance Overall balance assessment: Needs assistance Sitting-balance support: Feet supported, Bilateral upper extremity supported Sitting balance-Leahy Scale: Fair                                       Pertinent Vitals/Pain Pain  Assessment Faces Pain Scale: Hurts little more Pain Location: B knees Pain Descriptors / Indicators: Aching    Home Living Family/patient expects to be discharged to:: Private residence Living Arrangements: Spouse/significant other Available Help at Discharge: Family;Available 24 hours/day Type of Home: House Home Access: Stairs to enter Entrance Stairs-Rails: None Entrance Stairs-Number of Steps: 1   Home Layout: One level Home Equipment: Conservation officer, nature (2 wheels);Cane - single point;BSC/3in1;Shower seat;Wheelchair - power;Wheelchair - manual Additional  Comments: wife's son is in [place] if need to help    Prior Function Prior Level of Function : Needs assist       Physical Assist : ADLs (physical)   ADLs (physical): Grooming;Bathing Mobility Comments: uses his power chair throughout the house (except bathroom). out of the house uses RW. ADLs Comments: needs assist with socks/shoes and showering, wife does the shopping and meal prep     Hand Dominance   Dominant Hand: Right    Extremity/Trunk Assessment   Upper Extremity Assessment RUE Deficits / Details: Pt reported chronic arthritis in BUE and noted LUE slightly more fluid in movement then RUE. Pt about 90 degrees with BUE but very ridige movements RUE Sensation: WNL RUE Coordination: decreased fine motor;decreased gross motor LUE Deficits / Details: Pt reported chronic arthritis in BUE and noted LUE slightly more fluid in movement then RUE. Pt about 90 degrees with BUE but very ridige movements LUE Sensation: WNL LUE Coordination: decreased fine motor;decreased gross motor    Lower Extremity Assessment Lower Extremity Assessment: RLE deficits/detail;LLE deficits/detail RLE: Unable to fully assess due to pain RLE Sensation: WNL LLE Sensation: WNL    Cervical / Trunk Assessment Cervical / Trunk Assessment: Kyphotic  Communication   Communication: No difficulties  Cognition Arousal/Alertness: Awake/alert Behavior During Therapy: WFL for tasks assessed/performed Overall Cognitive Status: Within Functional Limits for tasks assessed                                          General Comments      Exercises     Assessment/Plan    PT Assessment Patient needs continued PT services  PT Problem List Decreased strength;Decreased range of motion;Decreased activity tolerance;Decreased balance;Decreased mobility;Decreased knowledge of use of DME;Decreased safety awareness;Decreased knowledge of precautions;Pain       PT Treatment Interventions DME  instruction;Gait training;Stair training;Functional mobility training;Therapeutic activities;Therapeutic exercise;Balance training;Patient/family education    PT Goals (Current goals can be found in the Care Plan section)  Acute Rehab PT Goals Patient Stated Goal: stop hurting, get better PT Goal Formulation: With patient/family Time For Goal Achievement: 10/11/22 Potential to Achieve Goals: Fair    Frequency Min 3X/week     Co-evaluation               AM-PAC PT "6 Clicks" Mobility  Outcome Measure Help needed turning from your back to your side while in a flat bed without using bedrails?: A Lot Help needed moving from lying on your back to sitting on the side of a flat bed without using bedrails?: Total Help needed moving to and from a bed to a chair (including a wheelchair)?: Total Help needed standing up from a chair using your arms (e.g., wheelchair or bedside chair)?: Total Help needed to walk in hospital room?: Total Help needed climbing 3-5 steps with a railing? : Total 6 Click Score: 7    End of Session Equipment Utilized During Treatment:  Gait belt Activity Tolerance: Patient limited by pain Patient left: in bed;with call bell/phone within reach;with bed alarm set;with family/visitor present Nurse Communication: Mobility status PT Visit Diagnosis: Unsteadiness on feet (R26.81);Other abnormalities of gait and mobility (R26.89);History of falling (Z91.81);Muscle weakness (generalized) (M62.81);Difficulty in walking, not elsewhere classified (R26.2)    Time: 1607-3710 PT Time Calculation (min) (ACUTE ONLY): 20 min   Charges:   PT Evaluation $PT Eval Moderate Complexity: 1 Mod          Verner Mould, DPT Acute Rehabilitation Services Office [phone]  09/27/22 2:51 PM

## 2022-09-27 NOTE — Consult Note (Signed)
Nathaniel Nakayama, MD  [name], PA-C                                  [facility]                [address] [place], [place]  [address]   [place]            MRN:  [id number] DOB/SEX:  12/07/1934/male     CHIEF COMPLAINT:  Painful right hip  HISTORY: Nathaniel [name] a 87 y.o. male with history of primary THR by Dr Olen Cordial in [facility] in 1999. Had acetabular revision by Dr Mayer Camel in 2005.  Fell yesterday on hip and has had pain. Admitted to medicine and ORS consulted   PAST MEDICAL HISTORY: Patient Active Problem List   Diagnosis Date Noted   Peri-prosthetic fracture around prosthetic hip 09/26/2022   Hematoma of right hip 09/26/2022   Traumatic hematoma of forehead 09/26/2022   Fracture of spinous process of lumbar vertebra ([place]) 09/26/2022   Incidental pulmonary nodule 46/65/9935   Metabolic acidosis 70/17/7939   Microscopic hematuria 09/26/2022   Hematoma 02/15/2022   Essential hypertension 02/15/2022   Peripheral arterial disease ([place]) 02/15/2022   Hypokalemia 02/15/2022   Hematemesis with nausea    Melena    Acute upper GI bleed 04/21/2019   Acute upper GI bleeding 04/21/2019   Rheumatoid arthritis ([place]) 04/21/2019   Chronic pain 04/21/2019   Chronic combined systolic and diastolic CHF (congestive heart failure) ([place]) 04/21/2019   Macrocytic anemia 04/21/2019   Generalized weakness    Altered mental status 04/17/2018   AKI (acute kidney injury) ([place]) 04/17/2018   Dizziness    Leg weakness 04/19/2015   Osteoarthritis of right knee 12/19/2011   Past Medical History:  Diagnosis Date   AKI (acute kidney injury) ([place]) 04/17/2018   Altered mental status 04/17/2018   Arthritis    Blood transfusion 2005   s/p hip replacement   Colon polyps    Environmental allergies    pollen , leaves, grass   Erectile dysfunction    Glaucoma of left eye    legally blind in left eye   Hypertension    Past Surgical  History:  Procedure Laterality Date   CATARACT EXTRACTION     bilateral   ESOPHAGOGASTRODUODENOSCOPY (EGD) WITH PROPOFOL N/A 04/22/2019   Procedure: ESOPHAGOGASTRODUODENOSCOPY (EGD) WITH PROPOFOL;  Surgeon: Ladene Artist, MD;  Location: [facility];  Service: Endoscopy;  Laterality: N/A;   EYE SURGERY     GLAUCOMA VALVE INSERTION  2010,2011   Left eye   HIP ARTHROPLASTY  2005   Right Hip replacement x 3   HOT HEMOSTASIS N/A 04/22/2019   Procedure: HOT HEMOSTASIS (ARGON PLASMA COAGULATION/BICAP);  Surgeon: Ladene Artist, MD;  Location: [facility];  Service: Endoscopy;  Laterality: N/A;   JOINT REPLACEMENT     MULTIPLE TOOTH EXTRACTIONS  2012   had 23 teeth pulled   TOENAIL EXCISION     TOTAL KNEE ARTHROPLASTY  12/15/2011   Procedure: TOTAL KNEE ARTHROPLASTY;  Surgeon: Kerin Salen, MD;  Location: [facility];  Service: Orthopedics;  Laterality: Right;  DEPUY SIGMA      MEDICATIONS:   Current Facility-Administered Medications:    amLODipine (NORVASC) tablet 10 mg, 10 mg, Oral, Daily, Rathore, Vasundhra, MD   morphine (PF) 2 MG/ML injection 1 mg, 1 mg, Intravenous, Q4H PRN, Shela Leff, MD  naloxone (NARCAN) injection 0.4 mg, 0.4 mg, Intravenous, PRN, Shela Leff, MD   predniSONE (DELTASONE) tablet 5 mg, 5 mg, Oral, BID WC, Shela Leff, MD   sulfaSALAzine (AZULFIDINE) tablet 1,000 mg, 1,000 mg, Oral, BID WC, Shela Leff, MD   tamsulosin (FLOMAX) capsule 0.4 mg, 0.4 mg, Oral, QHS, Shela Leff, MD, 0.4 mg at 09/27/22 0046  ALLERGIES:   Allergies  Allergen Reactions   Ibuprofen Hives    Cannot take "Advil", but is able to tolerate ibuprofen   Tylenol [Acetaminophen] Rash    REVIEW OF SYSTEMS: REVIEWED IN DETAIL IN CHART  FAMILY HISTORY:  History reviewed. No pertinent family history.  SOCIAL HISTORY:   Social History   Tobacco Use   Smoking status: Former    Packs/day: 0.25    Years: 40.00    Total pack years: 10.00    Types: Cigarettes     Quit date: 12/07/1998    Years since quitting: 23.8   Smokeless tobacco: Former    Types: Chew  Substance Use Topics   Alcohol use: Yes    Comment: rare     EXAMINATION: Vital signs in last 24 hours: Temp:  [97.6 F (36.4 C)-98.3 F (36.8 C)] 97.6 F (36.4 C) (11/25 0648) Pulse Rate:  [68-94] 68 (11/25 0648) Resp:  [12-21] 14 (11/24 2321) BP: (111-171)/(71-96) 128/75 (11/25 0648) SpO2:  [90 %-99 %] 96 % (11/24 2321) Weight:  [73.5 kg] 73.5 kg (11/24 1818)  BP 128/75 (BP Location: Right Arm)   Pulse 68   Temp 97.6 F (36.4 C) (Oral)   Resp 14   Ht '5\' 10"'$  (1.778 m)   Wt 73.5 kg   SpO2 96%   BMI 23.24 kg/m   General Appearance:    Alert, cooperative, no distress, appears stated age  Head:    Normocephalic, contusion right eye  Eyes:    PERRL, conjunctiva/corneas clear, EOM's intact, fundi    benign, both eyes       Ears:    Normal TM's and external ear canals, both ears  Nose:   Nares normal, septum midline, mucosa normal, no drainage    or sinus tenderness  Throat:   Lips, mucosa, and tongue normal; teeth and gums normal  Neck:   Supple, symmetrical, trachea midline, no adenopathy;       thyroid:  No enlargement/tenderness/nodules; no carotid   bruit or JVD  Back:     Symmetric, no curvature, ROM normal, no CVA tenderness  Lungs:     Clear to auscultation bilaterally, respirations unlabored  Chest wall:    No tenderness or deformity  Heart:    Regular rate and rhythm, S1 and S2 normal, no murmur, rub   or gallop  Abdomen:     Soft, non-tender, bowel sounds active all four quadrants,    no masses, no organomegaly  Genitalia:    Rectal:    Extremities:   Painful ROM right leg. Moderate thigh swelling  Pulses:   2+ and symmetric all extremities  Skin:   Skin color, texture, turgor normal, no rashes or lesions  Lymph nodes:   Cervical, supraclavicular, and axillary nodes normal  Neurologic:   CNII-XII intact. Normal strength, sensation and reflexes      throughout     Musculoskeletal Exam:   as above with pain on ROM right hip   DIAGNOSTIC STUDIES: Recent laboratory studies: Recent Labs    09/26/22 1739 09/26/22 1749 09/27/22 0315  WBC 8.9  --  8.1  HGB 13.6 13.6 12.7*  HCT 43.9 40.0 39.2  PLT 151  --  143*   Recent Labs    09/26/22 1739 09/26/22 1749 09/27/22 0315  NA 143 141 144  K 3.5 3.3* 3.4*  CL 109 108 110  CO2 20*  --  25  BUN 8 7* 6*  CREATININE 1.10 1.00 0.83  GLUCOSE 103* 102* 112*  CALCIUM 9.4  --  9.2   Lab Results  Component Value Date   INR 1.06 04/17/2018   INR 1.07 04/19/2015   INR 1.49 12/19/2011     Recent Radiographic Studies :  CT and plain films show mostly greater troch fracture minimally displaced with long stem in good position.  ASSESSMENT:  right hip periprosthetic fracture stable   PLAN:  Does not need surgery.  Has stable construct.  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 Will need gradual mobilization with PT but will take several days. May need placement depending on progress.  Hessie Dibble 09/27/2022, 7:49 AM

## 2022-09-27 NOTE — Care Management Obs Status (Signed)
[place] NOTIFICATION   Patient Details  Name: DEMARYIUS IMRAN MRN: [id number] Date of Birth: 12-11-1934   Medicare Observation Status Notification Given:  Yes    Bartholomew Crews, RN 09/27/2022, 1:14 PM

## 2022-09-27 NOTE — NC FL2 (Addendum)
[place] LEVEL OF CARE SCREENING TOOL     IDENTIFICATION  Patient Name: Nathaniel [name] Birthdate: 1935-07-25 Sex: male Admission Date (Current Location): 09/26/2022  [facility] and [place] Number:  Herbalist and Address:  [facility] [place] [address] [place], [place] [address]      Provider Number: [id number]  Attending Physician Name and Address:  Caren Griffins, MD  Relative Name and Phone Number:  Deuser,Joyce A  [phone]    Current Level of Care: Hospital Recommended Level of Care: [place] Prior Approval Number:    Date Approved/Denied:   PASRR Number: [id number]  Discharge Plan: SNF    Current Diagnoses: Patient Active Problem List   Diagnosis Date Noted   Peri-prosthetic fracture around prosthetic hip 09/26/2022   Hematoma of right hip 09/26/2022   Traumatic hematoma of forehead 09/26/2022   Fracture of spinous process of lumbar vertebra ([place]) 09/26/2022   Incidental pulmonary nodule 41/93/7902   Metabolic acidosis 40/97/3532   Microscopic hematuria 09/26/2022   Hematoma 02/15/2022   Essential hypertension 02/15/2022   Peripheral arterial disease ([place]) 02/15/2022   Hypokalemia 02/15/2022   Hematemesis with nausea    Melena    Acute upper GI bleed 04/21/2019   Acute upper GI bleeding 04/21/2019   Rheumatoid arthritis ([place]) 04/21/2019   Chronic pain 04/21/2019   Chronic combined systolic and diastolic CHF (congestive heart failure) ([place]) 04/21/2019   Macrocytic anemia 04/21/2019   Generalized weakness    Altered mental status 04/17/2018   AKI (acute kidney injury) ([place]) 04/17/2018   Dizziness    Leg weakness 04/19/2015   Osteoarthritis of right knee 12/19/2011    Orientation RESPIRATION BLADDER Height & Weight     Self, Time, Situation, Place  Normal Incontinent, External catheter Weight: 162 lb (73.5 kg) Height:  '5\' 10"'$  (177.8 cm)  BEHAVIORAL SYMPTOMS/MOOD NEUROLOGICAL BOWEL  NUTRITION STATUS      Continent Diet (see discharge summary)  AMBULATORY STATUS COMMUNICATION OF NEEDS Skin   Limited Assist Verbally Normal                       Personal Care Assistance Level of Assistance  Bathing, Dressing Bathing Assistance: Limited assistance   Dressing Assistance: Limited assistance     Functional Limitations Info             SPECIAL CARE FACTORS FREQUENCY  PT (By licensed PT), OT (By licensed OT)     PT Frequency: 4-5x/wk OT Frequency: 4-5x/wk            Contractures Contractures Info: Not present    Additional Factors Info  Code Status, Allergies Code Status Info: FULL Allergies Info: Advil and Tylenol           Current Medications (09/27/2022):  This is the current hospital active medication list Current Facility-Administered Medications  Medication Dose Route Frequency Provider Last Rate Last Admin   amLODipine (NORVASC) tablet 10 mg  10 mg Oral Daily Caren Griffins, MD   10 mg at 09/27/22 9924   ascorbic acid (VITAMIN C) tablet 500 mg  500 mg Oral Daily Caren Griffins, MD   500 mg at 09/27/22 1349   baclofen (LIORESAL) tablet 10 mg  10 mg Oral QID PRN Caren Griffins, MD   10 mg at 09/27/22 1349   cyanocobalamin (VITAMIN B12) tablet 1,000 mcg  1,000 mcg Oral Daily Caren Griffins, MD   1,000 mcg at 09/27/22  1349   [START ON 09/28/2022] ferrous sulfate tablet 325 mg  325 mg Oral Q breakfast Gherghe, Vella Redhead, MD       folic acid (FOLVITE) tablet 1 mg  1 mg Oral QHS Gherghe, Vella Redhead, MD       HYDROcodone-acetaminophen (NORCO/VICODIN) 5-325 MG per tablet 1-2 tablet  1-2 tablet Oral Q6H PRN Caren Griffins, MD   1 tablet at 09/27/22 1349   morphine (PF) 2 MG/ML injection 1 mg  1 mg Intravenous Q4H PRN Shela Leff, MD   1 mg at 09/27/22 1243   naloxone (NARCAN) injection 0.4 mg  0.4 mg Intravenous PRN Shela Leff, MD       predniSONE (DELTASONE) tablet 5 mg  5 mg Oral BID WC Shela Leff, MD   5 mg at  09/27/22 4742   sulfaSALAzine (AZULFIDINE) tablet 1,000 mg  1,000 mg Oral BID WC Caren Griffins, MD   1,000 mg at 09/27/22 1243   tamsulosin (FLOMAX) capsule 0.4 mg  0.4 mg Oral QHS Caren Griffins, MD   0.4 mg at 09/27/22 0046   traZODone (DESYREL) tablet 150 mg  150 mg Oral QHS Caren Griffins, MD         Discharge Medications: Please see discharge summary for a list of discharge medications.  Relevant Imaging Results:  Relevant Lab Results:   Additional Information  SSN# [id number]  [place], LCSWA

## 2022-09-27 NOTE — Plan of Care (Signed)

## 2022-09-27 NOTE — Evaluation (Signed)
Occupational Therapy Evaluation Patient Details Name: Nathaniel [name] MRN: [id number] DOB: 1935-04-09 Today's Date: 09/27/2022   History of Present Illness Pt is a 87 yr old male who presented 11/24 due to a fall at home while using a leaf blower and holding onto his walker with one hand. CT of pelvis showed minimal displaced perihardware fx  and CT of head showed hematoma of head without fx. PMH: hypertension, GERD, rheumatoid arthritis, chronic pain, glaucoma with left eye blindness, chronic combined CHF, history of GI bleed, Rt THA x3, TKA.   Clinical Impression   Pt at this time required mod to max with supine to sitting, moderate assist with sit to stand from an elevated surface and only able to to take 5-6 lateral steps with mod assist with RW to L side to transfer to chair. Pt at this time requires min guard to min assist with UE ADLS and max to total with LB ADLS. Pt currently with functional limitations due to the deficits listed below (see OT Problem List).  Pt will benefit from skilled OT to increase their safety and independence with ADL and functional mobility for ADL to facilitate discharge to venue listed below.        Recommendations for follow up therapy are one component of a multi-disciplinary discharge planning process, led by the attending physician.  Recommendations may be updated based on patient status, additional functional criteria and insurance authorization.   Follow Up Recommendations  Skilled nursing-short term rehab (<3 hours/day)     Assistance Recommended at Discharge Frequent or constant Supervision/Assistance  Patient can return home with the following A lot of help with walking and/or transfers;A lot of help with bathing/dressing/bathroom;Assistance with cooking/housework;Assist for transportation;Help with stairs or ramp for entrance    Functional Status Assessment  Patient has had a recent decline in their functional status and demonstrates the ability to  make significant improvements in function in a reasonable and predictable amount of time.  Equipment Recommendations  None recommended by OT    Recommendations for Other Services       Precautions / Restrictions Precautions Precautions: Fall (Pt reported 2-3 falls and does not know when B knees may give out) Restrictions Weight Bearing Restrictions: Yes RLE Weight Bearing: Weight bearing as tolerated      Mobility Bed Mobility Overal bed mobility: Needs Assistance Bed Mobility: Supine to Sit     Supine to sit: Mod assist, Max assist, HOB elevated     General bed mobility comments: due to decrease AROM in BUE decrease in ability to use B bed rails    Transfers Overall transfer level: Needs assistance Equipment used: Rolling walker (2 wheels) Transfers: Sit to/from Stand Sit to Stand: Mod assist, From elevated surface                  Balance Overall balance assessment: Needs assistance Sitting-balance support: Feet supported, Bilateral upper extremity supported Sitting balance-Leahy Scale: Fair Sitting balance - Comments: as pt was able to increase in balance was able to complete some tasks without BUE support Postural control: Posterior lean, Left lateral lean Standing balance support: Bilateral upper extremity supported, Reliant on assistive device for balance Standing balance-Leahy Scale: Poor                             ADL either performed or assessed with clinical judgement   ADL Overall ADL's : Needs assistance/impaired Eating/Feeding: Independent;Sitting   Grooming: Wash/dry hands;Wash/dry  face;Set up;Sitting   Upper Body Bathing: Sitting;Minimal assistance   Lower Body Bathing: Maximal assistance;Cueing for safety;Cueing for sequencing;Sitting/lateral leans   Upper Body Dressing : Cueing for safety;Cueing for compensatory techniques;Sitting;Minimal assistance   Lower Body Dressing: Maximal assistance;Cueing for safety;Cueing for  sequencing;Sitting/lateral leans   Toilet Transfer: Moderate assistance;Cueing for safety;Cueing for sequencing;Rolling walker (2 wheels);BSC/3in1   Toileting- Clothing Manipulation and Hygiene: Total assistance;Cueing for safety;Cueing for sequencing;Sit to/from stand       Functional mobility during ADLs: Moderate assistance;Cueing for safety;Cueing for sequencing;Rolling walker (2 wheels) General ADL Comments: very limited ambulation at this time to 5-6 lateral steps     Vision Baseline Vision/History: 1 Wears glasses (blind in L eye) Patient Visual Report: No change from baseline       Perception     Praxis      Pertinent Vitals/Pain Pain Assessment Pain Assessment: Faces Faces Pain Scale: Hurts little more Pain Location: B knees Pain Descriptors / Indicators: Aching Pain Intervention(s): Limited activity within patient's tolerance, Monitored during session, Repositioned     Hand Dominance Right   Extremity/Trunk Assessment Upper Extremity Assessment Upper Extremity Assessment: Generalized weakness;RUE deficits/detail;LUE deficits/detail RUE Deficits / Details: Pt reported chronic arthritis in BUE and noted LUE slightly more fluid in movement then RUE. Pt about 90 degrees with BUE but very ridige movements RUE Sensation: WNL RUE Coordination: decreased fine motor;decreased gross motor LUE Deficits / Details: Pt reported chronic arthritis in BUE and noted LUE slightly more fluid in movement then RUE. Pt about 90 degrees with BUE but very ridige movements LUE Sensation: WNL LUE Coordination: decreased fine motor;decreased gross motor   Lower Extremity Assessment Lower Extremity Assessment: Defer to PT evaluation   Cervical / Trunk Assessment Cervical / Trunk Assessment: Kyphotic   Communication Communication Communication: No difficulties   Cognition Arousal/Alertness: Awake/alert Behavior During Therapy: WFL for tasks assessed/performed Overall Cognitive  Status: Within Functional Limits for tasks assessed                                       General Comments       Exercises     Shoulder Instructions      Home Living Family/patient expects to be discharged to:: Private residence Living Arrangements: Spouse/significant other Available Help at Discharge: Family;Available 24 hours/day Type of Home: House Home Access: Stairs to enter [employer] of Steps: 1 Entrance Stairs-Rails: None Home Layout: One level     Bathroom Shower/Tub: Occupational psychologist: Handicapped height Bathroom Accessibility: No   Home Equipment: Conservation officer, nature (2 wheels);Cane - single point;BSC/3in1;Shower seat;Wheelchair - power          Prior Functioning/Environment Prior Level of Function : Needs assist       Physical Assist : ADLs (physical)   ADLs (physical): Grooming;Bathing Mobility Comments: uses his power chair throughout the house (except bathroom) ADLs Comments: needs assist with socks/shoes and showering, wife does the shopping and meal prep        OT Problem List: Decreased strength;Decreased range of motion;Decreased activity tolerance;Impaired balance (sitting and/or standing);Decreased safety awareness;Decreased knowledge of use of DME or AE;Cardiopulmonary status limiting activity;Pain      OT Treatment/Interventions: Self-care/ADL training;Therapeutic exercise;DME and/or AE instruction;Therapeutic activities;Patient/family education;Balance training    OT Goals(Current goals can be found in the care plan section) Acute Rehab OT Goals Patient Stated Goal: to be able to move more OT Goal  Formulation: With patient Time For Goal Achievement: 10/11/22 Potential to Achieve Goals: Good  OT Frequency: Min 2X/week    Co-evaluation              AM-PAC OT "6 Clicks" Daily Activity     Outcome Measure Help from another person eating meals?: A Little Help from another person taking care of  personal grooming?: A Little Help from another person toileting, which includes using toliet, bedpan, or urinal?: Total Help from another person bathing (including washing, rinsing, drying)?: A Lot Help from another person to put on and taking off regular upper body clothing?: A Little Help from another person to put on and taking off regular lower body clothing?: A Lot 6 Click Score: 14   End of Session Equipment Utilized During Treatment: Gait belt;Rolling walker (2 wheels) Nurse Communication: Mobility status  Activity Tolerance: Patient limited by fatigue Patient left: in chair;with call bell/phone within reach;with nursing/sitter in room;with chair alarm set  OT Visit Diagnosis: Unsteadiness on feet (R26.81);Other abnormalities of gait and mobility (R26.89);Repeated falls (R29.6);Muscle weakness (generalized) (M62.81);History of falling (Z91.81);Pain Pain - Right/Left: Right Pain - part of body: Hip                Time: 6812-7517 OT Time Calculation (min): 40 min Charges:  OT General Charges $OT Visit: 1 Visit OT Evaluation $OT Eval Low Complexity: 1 Low OT Treatments $Self Care/Home Management : 23-37 mins  Joeseph Amor OTR/L  Acute Rehab Services  [phone] office number [phone] pager number   Joeseph Amor 09/27/2022, 9:02 AM

## 2022-09-28 DIAGNOSIS — M978XXD Periprosthetic fracture around other internal prosthetic joint, subsequent encounter: Secondary | ICD-10-CM | POA: Diagnosis not present

## 2022-09-28 DIAGNOSIS — I5042 Chronic combined systolic (congestive) and diastolic (congestive) heart failure: Secondary | ICD-10-CM | POA: Diagnosis not present

## 2022-09-28 DIAGNOSIS — E872 Acidosis, unspecified: Secondary | ICD-10-CM | POA: Diagnosis not present

## 2022-09-28 DIAGNOSIS — R911 Solitary pulmonary nodule: Secondary | ICD-10-CM | POA: Diagnosis not present

## 2022-09-28 LAB — COMPREHENSIVE METABOLIC PANEL
ALT: 14 U/L (ref 0–44)
AST: 19 U/L (ref 15–41)
Albumin: 3.2 g/dL — ABNORMAL LOW (ref 3.5–5.0)
Alkaline Phosphatase: 56 U/L (ref 38–126)
Anion gap: 8 (ref 5–15)
BUN: 7 mg/dL — ABNORMAL LOW (ref 8–23)
CO2: 24 mmol/L (ref 22–32)
Calcium: 8.8 mg/dL — ABNORMAL LOW (ref 8.9–10.3)
Chloride: 109 mmol/L (ref 98–111)
Creatinine, Ser: 0.99 mg/dL (ref 0.61–1.24)
GFR, Estimated: >60 mL/min (ref >60)
Glucose, Bld: 104 mg/dL — ABNORMAL HIGH (ref 70–99)
Potassium: 4.2 mmol/L (ref 3.5–5.1)
Sodium: 141 mmol/L (ref 135–145)
Total Bilirubin: 0.7 mg/dL (ref 0.3–1.2)
Total Protein: 5 g/dL — ABNORMAL LOW (ref 6.5–8.1)

## 2022-09-28 LAB — CBC
HCT: 38.5 % — ABNORMAL LOW (ref 39.0–52.0)
Hemoglobin: 12.4 g/dL — ABNORMAL LOW (ref 13.0–17.0)
MCH: 34.5 pg — ABNORMAL HIGH (ref 26.0–34.0)
MCHC: 32.2 g/dL (ref 30.0–36.0)
MCV: 107.2 fL — ABNORMAL HIGH (ref 80.0–100.0)
Platelets: 141 10*3/uL — ABNORMAL LOW (ref 150–400)
RBC: 3.59 MIL/uL — ABNORMAL LOW (ref 4.22–5.81)
RDW: 14.8 % (ref 11.5–15.5)
WBC: 7 10*3/uL (ref 4.0–10.5)
nRBC: 0 % (ref 0.0–0.2)

## 2022-09-28 LAB — MAGNESIUM: Magnesium: 1.8 mg/dL (ref 1.7–2.4)

## 2022-09-28 MED ORDER — DORZOLAMIDE HCL 2 % OP SOLN
1.0000 [drp] | Freq: Two times a day (BID) | OPHTHALMIC | Status: DC
  Administered 2022-09-28 – 2022-10-01 (×7): 1 [drp] via OPHTHALMIC
  Filled 2022-09-28: qty 10

## 2022-09-28 MED ORDER — CARVEDILOL 3.125 MG PO TABS
3.1250 mg | ORAL_TABLET | Freq: Two times a day (BID) | ORAL | Status: DC
  Administered 2022-09-28 – 2022-10-01 (×8): 3.125 mg via ORAL
  Filled 2022-09-28 (×8): qty 1

## 2022-09-28 MED ORDER — BRIMONIDINE TARTRATE 0.2 % OP SOLN
1.0000 [drp] | Freq: Two times a day (BID) | OPHTHALMIC | Status: DC
  Administered 2022-09-28 – 2022-10-01 (×7): 1 [drp] via OPHTHALMIC
  Filled 2022-09-28: qty 5

## 2022-09-28 NOTE — Progress Notes (Signed)
PROGRESS NOTE  Nathaniel [name] KKX:[id number] DOB: Feb 05, 1935 DOA: 09/26/2022 PCP: Shirline Frees, MD   LOS: 1 day   Brief Narrative / Interim history: 87 year old male with HTN, RA, left eye blindness, chronic combined CHF, prior GI bleed, PAD on Plavix, history of right hip total arthroplasty comes into the hospital after a mechanical fall.  He was out in the yard blowing leaves, 1 hand was on the walker and the other 1 on the leaf blower, when he lost balance, fell to the ground and hit his head.  He came to the ED and imaging of the hip showed a periprosthetic fracture with soft tissue contusion and hematoma, and CT head without acute intracranial pathologies.  Orthopedic surgery consulted  Subjective / 24h Interval events: Complains of pain, awaiting pain medications.  Assesement and Plan: Principal Problem:   Peri-prosthetic fracture around prosthetic hip Active Problems:   Rheumatoid arthritis (HCC)   Chronic combined systolic and diastolic CHF (congestive heart failure) (HCC)   Essential hypertension   Hypokalemia   Hematoma of right hip   Traumatic hematoma of forehead   Fracture of spinous process of lumbar vertebra (HCC)   Incidental pulmonary nodule   Metabolic acidosis   Microscopic hematuria   Hip fracture (HCC)   Principal problem Right hip periprosthetic fracture-orthopedic surgery consulted, Dr. Rhona Raider saw patient on 11/25, and fracture looks stable and hardware does not look compromised.  Recommends nonoperative management. -Mobilize with PT/OT, SNF pending  Active problems Right hip hematoma -Hold Plavix at this time and monitor H&H, discussed with orthopedics this morning, Plavix can be resumed at any time.  Plan for tomorrow   Right supraorbital and forehead swelling/hematoma -No underlying calvarial or facial bone fracture seen on CT.   Subacute left L3 spinous process fracture -he denies back pain   Incidental pulmonary nodule -CT showing a 6 mm nodule  in the right lung apex.  Patient is a former smoker.  Follow-up with CT chest as an outpatient in 6 to 12 months per radiology   Mild hypokalemia -continue to replace as indicated, monitor, potassium normalized today   Mild normal anion gap metabolic acidosis -resolved   Microscopic hematuria-asymptomatic.  Outpatient follow-up   Hypertension -continue amlodipine  Rheumatoid arthritis-On weekly Actemra and methotrexate, continue prednisone and sulfasalazine   Chronic combined CHF -No signs of volume overload.  Echo done June 2016 showing LVEF 40 to 16%, grade 1 diastolic dysfunction, and trivial AVR. Repeat echocardiogram ordered by admitting MD showing worsening EF EF of 96-78%, grade 1 diastolic dysfunction.  RV systolic function was also mildly reduced.  I will discuss with cardiology   Peripheral arterial disease -Hold Plavix at this time given fall and hematomas, resume tomorrow   BPH -Continue Flomax  Scheduled Meds:  amLODipine  10 mg Oral Daily   ascorbic acid  500 mg Oral Daily   cyanocobalamin  1,000 mcg Oral Daily   ferrous sulfate  325 mg Oral Q breakfast   folic acid  1 mg Oral QHS   predniSONE  5 mg Oral BID WC   sulfaSALAzine  1,000 mg Oral BID WC   tamsulosin  0.4 mg Oral QHS   traZODone  150 mg Oral QHS   Continuous Infusions: PRN Meds:.baclofen, HYDROcodone-acetaminophen, morphine injection, naLOXone (NARCAN)  injection  Current Outpatient Medications  Medication Instructions   Actemra ACTPen 162 mg, Subcutaneous, Every Thu   amLODipine (NORVASC) 10 mg, Oral, Daily   ascorbic acid (VITAMIN C) 500 mg, Oral, Daily  baclofen (LIORESAL) 10 mg, Oral, 2 times daily   brimonidine (ALPHAGAN) 0.2 % ophthalmic solution 1 drop, Both Eyes, 2 times daily   cetirizine (ZYRTEC) 10 mg, Oral, Daily   clopidogrel (PLAVIX) 75 mg, Oral, Daily   cyanocobalamin (VITAMIN B12) 1,000 mcg, Oral, Daily   diclofenac sodium (VOLTAREN) 1 % GEL 1 application , Topical, 2 times daily,  Left knee and both feet   dorzolamide (TRUSOPT) 2 % ophthalmic solution 1 drop, Both Eyes, 2 times daily   ferrous sulfate 325 mg, Oral, Daily with breakfast   Fish Oil 1,000 mg, Oral, Daily   folic acid (FOLVITE) 1 mg, Oral, Daily at bedtime   gabapentin (NEURONTIN) 800 mg, Oral, 4 times daily   HYDROcodone-acetaminophen (NORCO/VICODIN) 5-325 MG tablet 1 tablet, Oral, 3 times daily   methotrexate 25 mg, Subcutaneous, Every Wed   polyethylene glycol (MIRALAX / GLYCOLAX) 17 g, Oral, Nightly   predniSONE (DELTASONE) 5 mg, Oral, 2 times daily   sulfaSALAzine (AZULFIDINE) 1,000 mg, Oral, 2 times daily with breakfast and lunch   tamsulosin (FLOMAX) 0.4 mg, Oral, Nightly   traZODone (DESYREL) 150 mg, Oral, Daily at bedtime   TURMERIC PO 1 capsule, Oral, Daily    Diet Orders (From admission, onward)     Start     Ordered   09/27/22 0816  Diet regular Room service appropriate? Yes; Fluid consistency: Thin  Diet effective now       Question Answer Comment  Room service appropriate? Yes   Fluid consistency: Thin      09/27/22 0815            DVT prophylaxis: SCDs Start: 09/26/22 2318   Lab Results  Component Value Date   PLT 141 (L) 09/28/2022      Code Status: Full Code  Family Communication: no family at bedside   Status is: Inpatient, awaiting SNF   Level of care: Med-Surg  Consultants:  Orthopedic surgery  Objective: Vitals:   09/27/22 0648 09/27/22 0833 09/27/22 2109 09/28/22 0924  BP: 128/75 138/80 102/62 (!) 155/78  Pulse: 68 (!) 103 77 92  Resp:  '18 16 15  '$ Temp: 97.6 F (36.4 C) 98.6 F (37 C) 98.2 F (36.8 C) 98.2 F (36.8 C)  TempSrc: Oral Oral Oral Oral  SpO2:  96% 97% 98%  Weight:      Height:        Intake/Output Summary (Last 24 hours) at 09/28/2022 1112 Last data filed at 09/28/2022 0330 Gross per 24 hour  Intake 470 ml  Output 1100 ml  Net -630 ml    Wt Readings from Last 3 Encounters:  09/26/22 73.5 kg  04/24/19 85.1 kg  04/19/15  81.6 kg    Examination:  Constitutional: NAD Eyes: lids and conjunctivae normal, no scleral icterus ENMT: mmm Neck: normal, supple Respiratory: clear to auscultation bilaterally, no wheezing, no crackles. Normal respiratory effort.  Cardiovascular: Regular rate and rhythm, no murmurs / rubs / gallops. No LE edema. Abdomen: soft, no distention, no tenderness. Bowel sounds positive.  Skin: no rashes Neurologic: no focal deficits, equal strength  Data Reviewed: I have independently reviewed following labs and imaging studies   CBC Recent Labs  Lab 09/26/22 1739 09/26/22 1749 09/27/22 0315 09/28/22 0331  WBC 8.9  --  8.1 7.0  HGB 13.6 13.6 12.7* 12.4*  HCT 43.9 40.0 39.2 38.5*  PLT 151  --  143* 141*  MCV 109.2*  --  105.4* 107.2*  MCH 33.8  --  34.1* 34.5*  MCHC 31.0  --  32.4 32.2  RDW 14.5  --  14.5 14.8     Recent Labs  Lab 09/26/22 1739 09/26/22 1749 09/27/22 0315 09/28/22 0331  NA 143 141 144 141  K 3.5 3.3* 3.4* 4.2  CL 109 108 110 109  CO2 20*  --  25 24  GLUCOSE 103* 102* 112* 104*  BUN 8 7* 6* 7*  CREATININE 1.10 1.00 0.83 0.99  CALCIUM 9.4  --  9.2 8.8*  AST 24  --   --  19  ALT 14  --   --  14  ALKPHOS 71  --   --  56  BILITOT 0.4  --   --  0.7  ALBUMIN 3.9  --   --  3.2*  MG  --   --  1.8 1.8     ------------------------------------------------------------------------------------------------------------------ No results for input(s): "CHOL", "HDL", "LDLCALC", "TRIG", "CHOLHDL", "LDLDIRECT" in the last 72 hours.  Lab Results  Component Value Date   HGBA1C 6.3 (H) 04/20/2015   ------------------------------------------------------------------------------------------------------------------ No results for input(s): "TSH", "T4TOTAL", "T3FREE", "THYROIDAB" in the last 72 hours.  Invalid input(s): "FREET3"  Cardiac Enzymes No results for input(s): "CKMB", "TROPONINI", "MYOGLOBIN" in the last 168 hours.  Invalid input(s):  "CK" ------------------------------------------------------------------------------------------------------------------    Component Value Date/Time   BNP 63.6 02/16/2022 0147    CBG: No results for input(s): "GLUCAP" in the last 168 hours.  No results found for this or any previous visit (from the past 240 hour(s)).   Radiology Studies: ECHOCARDIOGRAM COMPLETE  Result Date: 09/27/2022    ECHOCARDIOGRAM REPORT   Patient Name:   Nathaniel [name] Date of Exam: 09/27/2022 Medical Rec #:  [id number]    Height:       70.0 in Accession #:    [id number]   Weight:       162.0 lb Date of Birth:  1935-08-08    BSA:          1.909 m Patient Age:    28 years     BP:           128/75 mmHg Patient Gender: M            HR:           73 bpm. Exam Location:  Inpatient Procedure: 2D Echo, Cardiac Doppler and Color Doppler Indications:    CHF-Acute Systolic V37.10  History:        Patient has prior history of Echocardiogram examinations, most                 recent 04/20/2015. CHF, PAD; Risk Factors:Hypertension.  Sonographer:    Ronny Flurry Referring Phys: [id number] [place]  1. Images are limited.  2. Left ventricular ejection fraction, by estimation, is 25 to 30%. The left ventricle has severely decreased function. Left ventricular endocardial border not optimally defined to evaluate regional wall motion. There is mild left ventricular hypertrophy. Left ventricular diastolic parameters are consistent with Grade I diastolic dysfunction (impaired relaxation).  3. Right ventricular systolic function is mildly reduced. The right ventricular size is normal. Tricuspid regurgitation signal is inadequate for assessing PA pressure.  4. The mitral valve is degenerative. Trivial mitral valve regurgitation.  5. The aortic valve is tricuspid. Aortic valve regurgitation is not visualized. Aortic valve sclerosis is present, with no evidence of aortic valve stenosis. Aortic valve mean gradient measures 2.0 mmHg.   6. Unable to estimate CVP. Comparison(s): Prior images unable to be directly viewed. FINDINGS  Left Ventricle: Left ventricular ejection fraction, by estimation, is 25 to 30%. The left ventricle has severely decreased function. Left ventricular endocardial border not optimally defined to evaluate regional wall motion. The left ventricular internal cavity size was normal in size. There is mild left ventricular hypertrophy. Left ventricular diastolic parameters are consistent with Grade I diastolic dysfunction (impaired relaxation). Right Ventricle: The right ventricular size is normal. No increase in right ventricular wall thickness. Right ventricular systolic function is mildly reduced. Tricuspid regurgitation signal is inadequate for assessing PA pressure. Left Atrium: Left atrial size was normal in size. Right Atrium: Right atrial size was normal in size. Pericardium: There is no evidence of pericardial effusion. Mitral Valve: The mitral valve is degenerative in appearance. There is mild calcification of the mitral valve leaflet(s). Mild mitral annular calcification. Trivial mitral valve regurgitation. Tricuspid Valve: The tricuspid valve is grossly normal. Tricuspid valve regurgitation is trivial. Aortic Valve: The aortic valve is tricuspid. There is mild aortic valve annular calcification. Aortic valve regurgitation is not visualized. Aortic valve sclerosis is present, with no evidence of aortic valve stenosis. Aortic valve mean gradient measures  2.0 mmHg. Aortic valve peak gradient measures 4.8 mmHg. Aortic valve area, by VTI measures 2.52 cm. Pulmonic Valve: The pulmonic valve was not well visualized. Pulmonic valve regurgitation is trivial. Aorta: The aortic root is normal in size and structure. Venous: Unable to estimate CVP. The inferior vena cava was not well visualized. IAS/Shunts: No atrial level shunt detected by color flow Doppler.  LEFT VENTRICLE PLAX 2D LVOT diam:     2.00 cm   Diastology LV SV:          58        LV e' medial:    3.59 cm/s LV SV Index:   30        LV E/e' medial:  17.0 LVOT Area:     3.14 cm  LV e' lateral:   6.96 cm/s                          LV E/e' lateral: 8.8  RIGHT VENTRICLE RV S prime:     11.70 cm/s TAPSE (M-mode): 1.9 cm LEFT ATRIUM             Index        RIGHT ATRIUM           Index LA Vol (A2C):   36.1 ml 18.92 ml/m  RA Area:     14.80 cm LA Vol (A4C):   28.5 ml 14.93 ml/m  RA Volume:   35.60 ml  18.65 ml/m LA Biplane Vol: 31.8 ml 16.66 ml/m  AORTIC VALVE AV Area (Vmax):    2.57 cm AV Area (Vmean):   2.53 cm AV Area (VTI):     2.52 cm AV Vmax:           109.00 cm/s AV Vmean:          71.900 cm/s AV VTI:            0.229 m AV Peak Grad:      4.8 mmHg AV Mean Grad:      2.0 mmHg LVOT Vmax:         89.00 cm/s LVOT Vmean:        58.000 cm/s LVOT VTI:          0.184 m LVOT/AV VTI ratio: 0.80  AORTA Ao Root diam: 3.60 cm MITRAL VALVE MV Area (PHT):  2.73 cm     SHUNTS MV Decel Time: 278 msec     Systemic VTI:  0.18 m MV E velocity: 61.10 cm/s   Systemic Diam: 2.00 cm MV A velocity: 101.00 cm/s MV E/A ratio:  0.60 Rozann Lesches MD Electronically signed by Rozann Lesches MD Signature Date/Time: 09/27/2022/2:01:07 PM    Final      Marzetta Board, MD, PhD [facility]  Between 7 am - 7 pm I am available, please contact me via Amion (for emergencies) or Securechat (non urgent messages)  Between 7 pm - 7 am I am not available, please contact night coverage MD/APP via Amion

## 2022-09-28 NOTE — Plan of Care (Signed)
  Problem: Education: Goal: Knowledge of General Education information will improve Description: Including pain rating scale, medication(s)/side effects and non-pharmacologic comfort measures Outcome: Progressing   Problem: Health Behavior/Discharge Planning: Goal: Ability to manage health-related needs will improve Outcome: Progressing   Problem: Activity: Goal: Risk for activity intolerance will decrease Outcome: Progressing   Problem: Elimination: Goal: Will not experience complications related to bowel motility Outcome: Progressing   

## 2022-09-29 ENCOUNTER — Inpatient Hospital Stay (HOSPITAL_COMMUNITY): Payer: Medicare Other

## 2022-09-29 DIAGNOSIS — I5022 Chronic systolic (congestive) heart failure: Secondary | ICD-10-CM

## 2022-09-29 LAB — TSH: TSH: 5.548 u[IU]/mL — ABNORMAL HIGH (ref 0.350–4.500)

## 2022-09-29 LAB — ECHOCARDIOGRAM LIMITED
Height: 70 in
Weight: 2592 oz

## 2022-09-29 MED ORDER — PERFLUTREN LIPID MICROSPHERE
1.0000 mL | INTRAVENOUS | Status: AC | PRN
  Administered 2022-09-29: 4 mL via INTRAVENOUS

## 2022-09-29 MED ORDER — LOSARTAN POTASSIUM 50 MG PO TABS: 25.0000 mg | ORAL_TABLET | Freq: Every day | ORAL | Status: DC

## 2022-09-29 MED ORDER — CLOPIDOGREL BISULFATE 75 MG PO TABS
75.0000 mg | ORAL_TABLET | Freq: Every day | ORAL | Status: DC
  Administered 2022-09-29 – 2022-10-01 (×3): 75 mg via ORAL
  Filled 2022-09-29 (×3): qty 1

## 2022-09-29 MED ORDER — ATORVASTATIN CALCIUM 10 MG PO TABS
20.0000 mg | ORAL_TABLET | Freq: Every day | ORAL | Status: DC
  Administered 2022-09-29 – 2022-10-01 (×3): 20 mg via ORAL
  Filled 2022-09-29 (×3): qty 2

## 2022-09-29 MED ORDER — LOSARTAN POTASSIUM 50 MG PO TABS
50.0000 mg | ORAL_TABLET | Freq: Every day | ORAL | Status: DC
  Administered 2022-09-29 – 2022-10-01 (×3): 50 mg via ORAL
  Filled 2022-09-29 (×3): qty 1

## 2022-09-29 MED ORDER — ALUM & MAG HYDROXIDE-SIMETH 200-200-20 MG/5ML PO SUSP
15.0000 mL | ORAL | Status: DC | PRN
  Administered 2022-09-29 – 2022-10-01 (×2): 15 mL via ORAL
  Filled 2022-09-29 (×2): qty 30

## 2022-09-29 NOTE — Progress Notes (Signed)
*  PRELIMINARY RESULTS* Echocardiogram 2D Echocardiogram has been performed.  Wynelle Link 09/29/2022, 4:12 PM

## 2022-09-29 NOTE — Consult Note (Signed)
Cardiology Consultation:  Patient ID: Nathaniel Nathaniel [name] MRN: [id number]; DOB: 1935-07-13  Admit date: 09/26/2022 Date of Consult: 09/29/2022  Primary Care Provider: Shirline [name], Nathaniel [name] Primary Cardiologist: None  Primary Electrophysiologist:  None   Patient Profile:  Nathaniel Nathaniel [name] is a 87 y.o. male with a hx of HTN, PAD who is being seen today for the evaluation of congestive heart failure at the request of Nathaniel Nathaniel [name], Nathaniel [name].  History of Present Illness:  Nathaniel Nathaniel [name] was admitted to the hospital on 09/26/2022 after a mechanical fall.  He came to the emergency room and imaging showed evidence of a right hip periprosthetic fracture.  Orthopedic surgery was consulted and recommended nonoperative management.  He is awaiting placement in a skilled nursing facility for rehab.  He was also noted to have a right hip hematoma.  He is on Plavix due to PAD.  See discussion below.  He was noted to have an ejection fraction of 40-45% in 2016.  An echocardiogram was ordered this hospitalization for further evaluation.  That echocardiogram was interpreted as an EF of 25-30%.  I did review the study myself.  The study is extremely poor quality and I cannot accurately determine the ejection fraction.  I have recommended a repeat study with Definity contrast.  I discussed the case with his wife Nathaniel Nathaniel [name].  Nathaniel Nathaniel [name] is currently delirious due to pain medication intake.  She reports no history of congestive heart failure.  He is never had a heart attack or stroke.  He has been quite healthy.  He actually has no evidence of congestive heart failure.  No lower extremity edema.  She tells me he has no symptoms of heart failure.  Laboratory data with normal serum creatinine 0.99.  Hemoglobin 12.4.  Platelets are stable.  Chest x-ray obtained on admission is normal.  Heart Pathway Score:       Past Medical History: Past Medical History:  Diagnosis Date   AKI (acute kidney injury) ([place]) 04/17/2018   Altered mental  status 04/17/2018   Arthritis    Blood transfusion 2005   s/p hip replacement   Colon polyps    Environmental allergies    pollen , leaves, grass   Erectile dysfunction    Glaucoma of left eye    legally blind in left eye   Hypertension     Past Surgical History: Past Surgical History:  Procedure Laterality Date   CATARACT EXTRACTION     bilateral   ESOPHAGOGASTRODUODENOSCOPY (EGD) WITH PROPOFOL N/A 04/22/2019   Procedure: ESOPHAGOGASTRODUODENOSCOPY (EGD) WITH PROPOFOL;  Surgeon: Nathaniel Nathaniel [name], Nathaniel [name];  Location: [facility];  Service: Endoscopy;  Laterality: N/A;   EYE SURGERY     GLAUCOMA VALVE INSERTION  2010,2011   Left eye   HIP ARTHROPLASTY  2005   Right Hip replacement x 3   HOT HEMOSTASIS N/A 04/22/2019   Procedure: HOT HEMOSTASIS (ARGON PLASMA COAGULATION/BICAP);  Surgeon: Nathaniel Nathaniel [name], Nathaniel [name];  [name];  Service: Endoscopy;  Laterality: N/A;   JOINT REPLACEMENT     MULTIPLE TOOTH EXTRACTIONS  2012   had 23 teeth pulled   TOENAIL EXCISION     TOTAL KNEE ARTHROPLASTY  12/15/2011   Procedure: TOTAL KNEE ARTHROPLASTY;  Surgeon: Nathaniel Nathaniel [name], Nathaniel [name];  Location: [place];  Service: Orthopedics;  Laterality: Right;  DEPUY SIGMA      Home Medications:  Prior to Admission medications   Medication Sig Start Date End Date Taking? Authorizing Provider  ACTEMRA ACTPEN 162 MG/0.9ML SOAJ  Inject 162 mg into the skin every Thursday. 11/12/21  Yes Nathaniel Nathaniel [name], Nathaniel [name]  amLODipine (NORVASC) 10 MG tablet Take 1 tablet (10 mg total) by mouth daily. 02/17/22  Yes Nathaniel Nathaniel [name], Nathaniel [name]  baclofen (LIORESAL) 10 MG tablet Take 1 tablet (10 mg total) by mouth 2 (two) times daily. Patient taking differently: Take 10 mg by mouth 4 (four) times daily. 04/18/18  Yes Nathaniel Nathaniel [name], Nathaniel [name]  brimonidine (ALPHAGAN) 0.2 % ophthalmic solution Place 1 drop into both eyes in the morning and at bedtime. 11/04/21  Yes Nathaniel Nathaniel [name], Nathaniel [name]  cetirizine (ZYRTEC) 10 MG tablet Take 10 mg by mouth  daily.   Yes Nathaniel Nathaniel [name], Nathaniel [name]  clopidogrel (PLAVIX) 75 MG tablet Take 1 tablet (75 mg total) by mouth daily. 02/19/22  Yes Nathaniel Nathaniel [name], Nathaniel [name]  diclofenac sodium (VOLTAREN) 1 % GEL Apply 1 application. topically in the morning and at bedtime. Left knee and both feet 04/15/18  Yes Nathaniel Nathaniel [name], Nathaniel [name]  dorzolamide (TRUSOPT) 2 % ophthalmic solution Place 1 drop into both eyes 2 (two) times daily. 01/03/22  Yes Nathaniel Nathaniel [name], Nathaniel [name]  ferrous sulfate 325 (65 FE) MG tablet Take 325 mg by mouth daily with breakfast.   Yes Nathaniel Nathaniel [name], Nathaniel [name]  folic acid (FOLVITE) 1 MG tablet Take 1 mg by mouth at bedtime.    Yes Nathaniel Nathaniel [name], Nathaniel [name]  gabapentin (NEURONTIN) 800 MG tablet Take 800 mg by mouth 4 (four) times daily.   Yes Nathaniel Nathaniel [name], Nathaniel [name]  HYDROcodone-acetaminophen (NORCO/VICODIN) 5-325 MG tablet Take 1 tablet by mouth in the morning, at noon, and at bedtime. 03/09/15  Yes Nathaniel Nathaniel [name], Nathaniel [name]  methotrexate 50 MG/2ML injection Inject 25 mg into the skin every Wednesday.   Yes Nathaniel Nathaniel [name], Nathaniel [name]  Omega-3 Fatty Acids (FISH OIL) 1000 MG CPDR Take 1,000 mg by mouth daily.   Yes Nathaniel Nathaniel [name], Nathaniel [name]  polyethylene glycol (MIRALAX / GLYCOLAX) 17 g packet Take 17 g by mouth at bedtime.   Yes Nathaniel Nathaniel [name], Nathaniel [name]  predniSONE (DELTASONE) 5 MG tablet Take 5 mg by mouth in the morning and at bedtime. 07/23/22  Yes Nathaniel Nathaniel [name], Nathaniel [name]  sulfaSALAzine (AZULFIDINE) 500 MG tablet Take 1,000 mg by mouth 2 (two) times daily with breakfast and lunch. 01/03/22  Yes Nathaniel Nathaniel [name], Nathaniel [name]  tamsulosin (FLOMAX) 0.4 MG CAPS capsule Take 0.4 mg by mouth at bedtime. 09/19/22  Yes Nathaniel Nathaniel [name], Nathaniel [name]  traZODone (DESYREL) 150 MG tablet Take 150 mg by mouth at bedtime. 11/28/21  Yes Nathaniel Nathaniel [name], Nathaniel [name]  TURMERIC PO Take 1 capsule by mouth daily.   Yes Nathaniel Nathaniel [name], Nathaniel [name]  vitamin B-12 (CYANOCOBALAMIN) 1000 MCG tablet Take 1,000 mcg by mouth daily.   Yes Nathaniel Nathaniel [name],  Nathaniel [name]  vitamin C (ASCORBIC ACID) 500 MG tablet Take 500 mg by mouth daily.   Yes Nathaniel Nathaniel [name], Nathaniel [name]    Inpatient Medications: Scheduled Meds:  ascorbic acid  500 mg Oral Daily   brimonidine  1 drop Both Eyes Q12H   carvedilol  3.125 mg Oral BID WC   clopidogrel  75 mg Oral Daily   cyanocobalamin  1,000 mcg Oral Daily   dorzolamide  1 drop Both Eyes BID   ferrous sulfate  325 mg Oral Q breakfast   folic acid  1 mg Oral QHS   predniSONE  5 mg Oral BID WC   sulfaSALAzine  1,000 mg Oral BID WC   tamsulosin  0.4 mg Oral QHS   Continuous Infusions:  PRN Meds: HYDROcodone-acetaminophen, naLOXone (NARCAN)  injection  Allergies:  Allergies  Allergen Reactions   Ibuprofen Hives    Cannot take "Advil", but is able to tolerate ibuprofen   Tylenol [Acetaminophen] Rash    Social History:   Social History   Socioeconomic History   Marital status: Married    Spouse name: Not on file   Number of children: Not on file   Years of education: Not on file   Highest education level: Not on file  Occupational History   Not on file  Tobacco Use   Smoking status: Former    Packs/day: 0.25    Years: 40.00    Total pack years: 10.00    Types: Cigarettes    Quit date: 12/07/1998    Years since quitting: 23.8   Smokeless tobacco: Former    Types: Nurse, children's Use: Never used  Substance and Sexual Activity   Alcohol use: Yes    Comment: rare   Drug use: No   Sexual activity: Not on file  Other Topics Concern   Not on file  Social History Narrative   Not on file   Social Determinants of Health   Financial Resource Strain: Not on file  Food Insecurity: Not on file  Transportation Needs: Not on file  Physical Activity: Not on file  Stress: Not on file  Social Connections: Not on file  Intimate Partner Violence: Not on file     Family History:   History reviewed. No pertinent family history.   ROS:  All other ROS reviewed and negative. Pertinent positives  noted in the HPI.     Physical Exam/Data:   Vitals:   09/28/22 2231 09/29/22 0432 09/29/22 0852 09/29/22 1525  BP: 126/78 121/72 (!) 160/97 (!) 149/106  Pulse: 82 78 (!) 109 100  Resp: '18 16 17 17  '$ Temp:  98.2 F (36.8 C) 98.2 F (36.8 C) 99.2 F (37.3 C)  TempSrc:  Oral Oral Oral  SpO2: 98% 97% 92% 98%  Weight:      Height:        Intake/Output Summary (Last 24 hours) at 09/29/2022 1554 Last data filed at 09/29/2022 0311 Gross per 24 hour  Intake 500 ml  Output --  Net 500 ml       09/26/2022    6:18 PM 04/24/2019    4:52 AM 04/23/2019    6:00 AM  Last 3 Weights  Weight (lbs) 162 lb 187 lb 9.8 oz 194 lb 7.1 oz  Weight (kg) 73.483 kg 85.1 kg 88.2 kg    Body mass index is 23.24 kg/m.  General: Well nourished, well developed, in no acute distress Head: Atraumatic, normal size  Eyes: PEERLA, EOMI  Neck: Supple, no JVD Endocrine: No thryomegaly Cardiac: Normal S1, S2; RRR; no murmurs, rubs, or gallops Lungs: Clear to auscultation bilaterally, no wheezing, rhonchi or rales  Abd: Soft, nontender, no hepatomegaly  Ext: No edema, pulses 2+ Musculoskeletal: No deformities, BUE and BLE strength normal and equal Skin: Warm and dry, no rashes   Neuro: Alert and oriented to person, place, time, and situation, CNII-XII grossly intact, no focal deficits  Psych: Normal mood and affect   EKG:  The EKG was personally reviewed and demonstrates: Normal sinus rhythm heart rate 100, no acute ischemic changes or evidence of infarction  Relevant CV Studies: Vasc US 01/20/2020 Procedure Tibial artery Doppler analysis was performed in the lower extremities bilaterally, with ankle brachial indexes. The study was technically excellent with all images being of optimal quality.  Right Ankle The AB ratio in the right side measures 1.16. The right ABI suggests normal peripheral arterial flow.  Right Segmental RT PTA, ATA, and Peroneal A waveforms are triphasic.  Left Ankle The AB ratio  in the left side measures 1,11. The left ABI suggests normal peripheral arterial flow.  Left Segmental The LT PTA, ATA, and Peroneal A are triphasic.  There is a collateral branch in the distal LT ATA with triphasic antegrade flow distallty and biphasic retrograde flow proximally. This is suggestive of proximal LT ATA occlusion with distal collateral flow.  TTE 09/27/2022  1. Images are limited.   2. Left ventricular ejection fraction, by estimation, is 25 to 30%. The  left ventricle has severely decreased function. Left ventricular  endocardial border not optimally defined to evaluate regional wall motion.  There is mild left ventricular  hypertrophy. Left ventricular diastolic parameters are consistent with  Grade I diastolic dysfunction (impaired relaxation).   3. Right ventricular systolic function is mildly reduced. The right  ventricular size is normal. Tricuspid regurgitation signal is inadequate  for assessing PA pressure.   4. The mitral valve is degenerative. Trivial mitral valve regurgitation.   5. The aortic valve is tricuspid. Aortic valve regurgitation is not  visualized. Aortic valve sclerosis is present, with no evidence of aortic  valve stenosis. Aortic valve mean gradient measures 2.0 mmHg.   6. Unable to estimate CVP.     Laboratory Data: High Sensitivity Troponin:  No results for input(s): "TROPONINIHS" in the last 720 hours.   Cardiac EnzymesNo results for input(s): "TROPONINI" in the last 168 hours. No results for input(s): "TROPIPOC" in the last 168 hours.  Chemistry Recent Labs  Lab 09/26/22 1739 09/26/22 1749 09/27/22 0315 09/28/22 0331  NA 143 141 144 141  K 3.5 3.3* 3.4* 4.2  CL 109 108 110 109  CO2 20*  --  25 24  GLUCOSE 103* 102* 112* 104*  BUN 8 7* 6* 7*  CREATININE 1.10 1.00 0.83 0.99  CALCIUM 9.4  --  9.2 8.8*  GFRNONAA >60  --  >60 >60  ANIONGAP 14  --  9 8    Recent Labs  Lab 09/26/22 1739 09/28/22 0331  PROT 5.7* 5.0*   ALBUMIN 3.9 3.2*  AST 24 19  ALT 14 14  ALKPHOS 71 56  BILITOT 0.4 0.7   Hematology Recent Labs  Lab 09/26/22 1739 09/26/22 1749 09/27/22 0315 09/28/22 0331  WBC 8.9  --  8.1 7.0  RBC 4.02*  --  3.72* 3.59*  HGB 13.6 13.6 12.7* 12.4*  HCT 43.9 40.0 39.2 38.5*  MCV 109.2*  --  105.4* 107.2*  MCH 33.8  --  34.1* 34.5*  MCHC 31.0  --  32.4 32.2  RDW 14.5  --  14.5 14.8  PLT 151  --  143* 141*   BNPNo results for input(s): "BNP", "PROBNP" in the last 168 hours.  DDimer No results for input(s): "DDIMER" in the last 168 hours.  Radiology/Studies:  ECHOCARDIOGRAM COMPLETE  Result Date: 09/27/2022    ECHOCARDIOGRAM REPORT   Patient Name:   KENYEN CANDY Date of Exam: 09/27/2022 Medical Rec #:  [id number]    Height:       70.0 in Accession #:    [id number]   Weight:       162.0 lb Date of Birth:  1935/01/03    BSA:          1.909 m Patient Age:    38 years  BP:           128/75 mmHg Patient Gender: M            HR:           73 bpm. Exam Location:  Inpatient Procedure: 2D Echo, Cardiac Doppler and Color Doppler Indications:    CHF-Acute Systolic M38.46  History:        Patient has prior history of Echocardiogram examinations, most                 recent 04/20/2015. CHF, PAD; Risk Factors:Hypertension.  Sonographer:    Ronny Flurry Referring Phys: [id number] [place]  1. Images are limited.  2. Left ventricular ejection fraction, by estimation, is 25 to 30%. The left ventricle has severely decreased function. Left ventricular endocardial border not optimally defined to evaluate regional wall motion. There is mild left ventricular hypertrophy. Left ventricular diastolic parameters are consistent with Grade I diastolic dysfunction (impaired relaxation).  3. Right ventricular systolic function is mildly reduced. The right ventricular size is normal. Tricuspid regurgitation signal is inadequate for assessing PA pressure.  4. The mitral valve is degenerative. Trivial mitral  valve regurgitation.  5. The aortic valve is tricuspid. Aortic valve regurgitation is not visualized. Aortic valve sclerosis is present, with no evidence of aortic valve stenosis. Aortic valve mean gradient measures 2.0 mmHg.  6. Unable to estimate CVP. Comparison(s): Prior images unable to be directly viewed. FINDINGS  Left Ventricle: Left ventricular ejection fraction, by estimation, is 25 to 30%. The left ventricle has severely decreased function. Left ventricular endocardial border not optimally defined to evaluate regional wall motion. The left ventricular internal cavity size was normal in size. There is mild left ventricular hypertrophy. Left ventricular diastolic parameters are consistent with Grade I diastolic dysfunction (impaired relaxation). Right Ventricle: The right ventricular size is normal. No increase in right ventricular wall thickness. Right ventricular systolic function is mildly reduced. Tricuspid regurgitation signal is inadequate for assessing PA pressure. Left Atrium: Left atrial size was normal in size. Right Atrium: Right atrial size was normal in size. Pericardium: There is no evidence of pericardial effusion. Mitral Valve: The mitral valve is degenerative in appearance. There is mild calcification of the mitral valve leaflet(s). Mild mitral annular calcification. Trivial mitral valve regurgitation. Tricuspid Valve: The tricuspid valve is grossly normal. Tricuspid valve regurgitation is trivial. Aortic Valve: The aortic valve is tricuspid. There is mild aortic valve annular calcification. Aortic valve regurgitation is not visualized. Aortic valve sclerosis is present, with no evidence of aortic valve stenosis. Aortic valve mean gradient measures  2.0 mmHg. Aortic valve peak gradient measures 4.8 mmHg. Aortic valve area, by VTI measures 2.52 cm. Pulmonic Valve: The pulmonic valve was not well visualized. Pulmonic valve regurgitation is trivial. Aorta: The aortic root is normal in size and  structure. Venous: Unable to estimate CVP. The inferior vena cava was not well visualized. IAS/Shunts: No atrial level shunt detected by color flow Doppler.  LEFT VENTRICLE PLAX 2D LVOT diam:     2.00 cm   Diastology LV SV:         58        LV e' medial:    3.59 cm/s LV SV Index:   30        LV E/e' medial:  17.0 LVOT Area:     3.14 cm  LV e' lateral:   6.96 cm/s  LV E/e' lateral: 8.8  RIGHT VENTRICLE RV S prime:     11.70 cm/s TAPSE (M-mode): 1.9 cm LEFT ATRIUM             Index        RIGHT ATRIUM           Index LA Vol (A2C):   36.1 ml 18.92 ml/m  RA Area:     14.80 cm LA Vol (A4C):   28.5 ml 14.93 ml/m  RA Volume:   35.60 ml  18.65 ml/m LA Biplane Vol: 31.8 ml 16.66 ml/m  AORTIC VALVE AV Area (Vmax):    2.57 cm AV Area (Vmean):   2.53 cm AV Area (VTI):     2.52 cm AV Vmax:           109.00 cm/s AV Vmean:          71.900 cm/s AV VTI:            0.229 m AV Peak Grad:      4.8 mmHg AV Mean Grad:      2.0 mmHg LVOT Vmax:         89.00 cm/s LVOT Vmean:        58.000 cm/s LVOT VTI:          0.184 m LVOT/AV VTI ratio: 0.80  AORTA Ao Root diam: 3.60 cm MITRAL VALVE MV Area (PHT): 2.73 cm     SHUNTS MV Decel Time: 278 msec     Systemic VTI:  0.18 m MV E velocity: 61.10 cm/s   Systemic Diam: 2.00 cm MV A velocity: 101.00 cm/s MV E/A ratio:  0.60 [facility]iel [facility]iel [name] Electronically signed by Nathaniel Nathaniel [name] Signature Date/Time: 09/27/2022/2:01:07 PM    Final    CT HEAD WO CONTRAST  Result Date: 09/26/2022 CLINICAL DATA:  Fall EXAM: CT HEAD WITHOUT CONTRAST CT MAXILLOFACIAL WITHOUT CONTRAST CT CERVICAL SPINE WITHOUT CONTRAST TECHNIQUE: Multidetector CT imaging of the head, cervical spine, and maxillofacial structures were performed using the standard protocol without intravenous contrast. Multiplanar CT image reconstructions of the cervical spine and maxillofacial structures were also generated. RADIATION DOSE REDUCTION: This exam was performed according to the departmental  dose-optimization program which includes automated exposure control, adjustment of the mA and/or kV according to patient size and/or use of iterative reconstruction technique. COMPARISON:  CT head 02/14/2022, MR head 04/17/2018 FINDINGS: CT HEAD FINDINGS Brain: There is no acute intracranial hemorrhage, extra-axial fluid collection, or acute infarct. Parenchymal volume is normal for age. The ventricles are normal in size. Gray-white differentiation is preserved. 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 An additional small focus of hypodensity in the centrum semiovale on the left more posteriorly may reflect background chronic small-vessel ischemic change or interval infarct, though still remote in appearance. There is no mass lesion.  There is no mass effect or midline shift. Vascular: There is calcification of the bilateral carotid siphons. Skull: Normal. Negative for fracture or focal lesion. Other: None. CT MAXILLOFACIAL FINDINGS Osseous: There is no acute facial bone fracture. There is no evidence of mandibular dislocation. There is no suspicious osseous lesion. Orbits: There is significant right periorbital soft tissue swelling with a right frontal scalp hematoma. There is no retrobulbar hematoma. The globe is intact. 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 There is a punctate metallic density foreign body in the left globe, present in 2016. Note that at  least 2 brain MRIs were obtained after the CT in 2016 which demonstrated this foreign body. Sinuses: Clear. Soft tissues: Right supraorbital soft tissue swelling and frontal scalp swelling/hematoma as above. CT CERVICAL SPINE FINDINGS Alignment: There is trace anterolisthesis of C4 on C5, C5 on C6, and C7 on T1. There is no jumped or perched facet or other evidence of traumatic malalignment. Skull base and vertebrae: Skull base alignment is  maintained. There is no evidence of acute fracture. There is no suspicious osseous lesion. Soft tissues and spinal canal: No prevertebral fluid or swelling. No visible canal hematoma. Disc levels: There is disc space narrowing and degenerative endplate change most advanced at C2-C3 and C6-C7. Facet arthropathy is most advanced on the left at C3-C4. There is no evidence of high-grade spinal canal stenosis. Upper chest: There is fibrotic change with architectural distortion/scarring in both lung apices. There is a 6 mm nodule in the right lung apex. Other: None. IMPRESSION: 1. No acute intracranial pathology. 2. Right supraorbital and forehead swelling/hematoma without underlying calvarial or facial bone fracture. 3. No acute fracture or traumatic malalignment of the cervical spine. 4. 6 mm nodule in the right lung apex. In the absence of prior studies for comparison, non-contrast chest CT at 6-12 months is recommended, depending on patient comorbidities given age. Electronically Signed   By: Valetta Mole M.D.   On: 09/26/2022 18:50   CT MAXILLOFACIAL WO CONTRAST  Result Date: 09/26/2022 CLINICAL DATA:  Fall EXAM: CT HEAD WITHOUT CONTRAST CT MAXILLOFACIAL WITHOUT CONTRAST CT CERVICAL SPINE WITHOUT CONTRAST TECHNIQUE: Multidetector CT imaging of the head, cervical spine, and maxillofacial structures were performed using the standard protocol without intravenous contrast. Multiplanar CT image reconstructions of the cervical spine and maxillofacial structures were also generated. RADIATION DOSE REDUCTION: This exam was performed according to the departmental dose-optimization program which includes automated exposure control, adjustment of the mA and/or kV according to patient size and/or use of iterative reconstruction technique. COMPARISON:  CT head 02/14/2022, MR head 04/17/2018 FINDINGS: CT HEAD FINDINGS Brain: There is no acute intracranial hemorrhage, extra-axial fluid collection, or acute infarct. Parenchymal  volume is normal for age. The ventricles are normal in size. Gray-white differentiation is preserved. 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 An additional small focus of hypodensity in the centrum semiovale on the left more posteriorly may reflect background chronic small-vessel ischemic change or interval infarct, though still remote in appearance. There is no mass lesion.  There is no mass effect or midline shift. Vascular: There is calcification of the bilateral carotid siphons. Skull: Normal. Negative for fracture or focal lesion. Other: None. CT MAXILLOFACIAL FINDINGS Osseous: There is no acute facial bone fracture. There is no evidence of mandibular dislocation. There is no suspicious osseous lesion. Orbits: There is significant right periorbital soft tissue swelling with a right frontal scalp hematoma. There is no retrobulbar hematoma. The globe is intact. 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 There is a punctate metallic density foreign body in the left globe, present in 2016. Note that at least 2 brain MRIs were obtained after the CT in 2016 which demonstrated this foreign body. Sinuses: Clear. Soft tissues: Right supraorbital soft tissue swelling and frontal scalp swelling/hematoma as above. CT CERVICAL SPINE FINDINGS Alignment: There is trace anterolisthesis of C4 on C5, C5 on C6, and C7 on T1. There is no jumped or perched facet or other evidence of  traumatic malalignment. Skull base and vertebrae: Skull base alignment is maintained. There is no evidence of acute fracture. There is no suspicious osseous lesion. Soft tissues and spinal canal: No prevertebral fluid or swelling. No visible canal hematoma. Disc levels: There is disc space narrowing and degenerative endplate change most advanced at C2-C3 and C6-C7. Facet arthropathy is most advanced on the left at C3-C4. There is no evidence  of high-grade spinal canal stenosis. Upper chest: There is fibrotic change with architectural distortion/scarring in both lung apices. There is a 6 mm nodule in the right lung apex. Other: None. IMPRESSION: 1. No acute intracranial pathology. 2. Right supraorbital and forehead swelling/hematoma without underlying calvarial or facial bone fracture. 3. No acute fracture or traumatic malalignment of the cervical spine. 4. 6 mm nodule in the right lung apex. In the absence of prior studies for comparison, non-contrast chest CT at 6-12 months is recommended, depending on patient comorbidities given age. Electronically Signed   By: Valetta Mole M.D.   On: 09/26/2022 18:50   CT CERVICAL SPINE WO CONTRAST  Result Date: 09/26/2022 CLINICAL DATA:  Fall EXAM: CT HEAD WITHOUT CONTRAST CT MAXILLOFACIAL WITHOUT CONTRAST CT CERVICAL SPINE WITHOUT CONTRAST TECHNIQUE: Multidetector CT imaging of the head, cervical spine, and maxillofacial structures were performed using the standard protocol without intravenous contrast. Multiplanar CT image reconstructions of the cervical spine and maxillofacial structures were also generated. RADIATION DOSE REDUCTION: This exam was performed according to the departmental dose-optimization program which includes automated exposure control, adjustment of the mA and/or kV according to patient size and/or use of iterative reconstruction technique. COMPARISON:  CT head 02/14/2022, MR head 04/17/2018 FINDINGS: CT HEAD FINDINGS Brain: There is no acute intracranial hemorrhage, extra-axial fluid collection, or acute infarct. Parenchymal volume is normal for age. The ventricles are normal in size. Gray-white differentiation is preserved. 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 An additional small focus of hypodensity in the centrum semiovale on the left more posteriorly may reflect background chronic  small-vessel ischemic change or interval infarct, though still remote in appearance. There is no mass lesion.  There is no mass effect or midline shift. Vascular: There is calcification of the bilateral carotid siphons. Skull: Normal. Negative for fracture or focal lesion. Other: None. CT MAXILLOFACIAL FINDINGS Osseous: There is no acute facial bone fracture. There is no evidence of mandibular dislocation. There is no suspicious osseous lesion. Orbits: There is significant right periorbital soft tissue swelling with a right frontal scalp hematoma. There is no retrobulbar hematoma. The globe is intact. 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 There is a punctate metallic density foreign body in the left globe, present in 2016. Note that at least 2 brain MRIs were obtained after the CT in 2016 which demonstrated this foreign body. Sinuses: Clear. Soft tissues: Right supraorbital soft tissue swelling and frontal scalp swelling/hematoma as above. CT CERVICAL SPINE FINDINGS Alignment: There is trace anterolisthesis of C4 on C5, C5 on C6, and C7 on T1. There is no jumped or perched facet or other evidence of traumatic malalignment. Skull base and vertebrae: Skull base alignment is maintained. There is no evidence of acute fracture. There is no suspicious osseous lesion. Soft tissues and spinal canal: No prevertebral fluid or swelling. No visible canal hematoma. Disc levels: There is disc space narrowing and degenerative endplate change most advanced at C2-C3 and C6-C7. Facet arthropathy is most advanced on  the left at C3-C4. There is no evidence of high-grade spinal canal stenosis. Upper chest: There is fibrotic change with architectural distortion/scarring in both lung apices. There is a 6 mm nodule in the right lung apex. Other: None. IMPRESSION: 1. No acute intracranial pathology. 2. Right supraorbital and forehead swelling/hematoma without underlying calvarial or facial bone fracture. 3. No  acute fracture or traumatic malalignment of the cervical spine. 4. 6 mm nodule in the right lung apex. In the absence of prior studies for comparison, non-contrast chest CT at 6-12 months is recommended, depending on patient comorbidities given age. Electronically Signed   By: Valetta Mole M.D.   On: 09/26/2022 18:50   CT PELVIS WO CONTRAST  Result Date: 09/26/2022 CLINICAL DATA:  Fall hip fracture suspected EXAM: CT PELVIS WITHOUT CONTRAST TECHNIQUE: Multidetector CT imaging of the pelvis was performed following the standard protocol without intravenous contrast. RADIATION DOSE REDUCTION: This exam was performed according to the departmental dose-optimization program which includes automated exposure control, adjustment of the mA and/or kV according to patient size and/or use of iterative reconstruction technique. COMPARISON:  Same day radiographs FINDINGS: Urinary Tract:  No abnormality visualized. Bowel:  Unremarkable visualized pelvic bowel loops. Vascular/Lymphatic: No pathologically enlarged lymph nodes. Aortic atherosclerosis. Reproductive:  No mass or other significant abnormality Other:  None. Musculoskeletal: Status post right hip total arthroplasty. Minimally displaced perihardware fracture about the posterior aspect of the intratrochanteric right femur and right femoral arthroplasty component (series 3, image 114, 129). Overlying soft tissue contusion and hematoma. The acetabular component and bony acetabulum appear intact. No other displaced fracture or dislocation of the proximal left femur or bony pelvis. Subacute appearing, partially callused fracture of the left L3 spinous process, partially included (series 3, image 1). Posterior osteopenia. IMPRESSION: 1. Status post right hip total arthroplasty. 2. Minimally displaced perihardware fracture about the posterior aspect of the intratrochanteric right femur and right femoral arthroplasty component. 3. Overlying soft tissue contusion and hematoma.  4. No other displaced fracture or dislocation of the proximal left femur or bony pelvis. 5. Subacute appearing, partially callused fracture of the left L3 spinous process, partially imaged. 6. Osteopenia, which significantly limits sensitivity for nondisplaced fracture. Aortic Atherosclerosis (ICD10-I70.0). Electronically Signed   By: Delanna Ahmadi M.D.   On: 09/26/2022 18:30   DG Pelvis Portable  Result Date: 09/26/2022 CLINICAL DATA:  [place]. EXAM: PORTABLE PELVIS 1-2 VIEWS COMPARISON:  Radiographs 02/14/2022 FINDINGS: The right hip components are intact. No dislocation. Suspect a small nondisplaced fracture involving the lesser trochanter. The bony pelvis and left hip are intact. IMPRESSION: Suspect small nondisplaced fracture involving the right lesser trochanter. Electronically Signed   By: Marijo Sanes M.D.   On: 09/26/2022 18:07   DG Femur Min 2 Views Right  Result Date: 09/26/2022 CLINICAL DATA:  Fall, pain EXAM: RIGHT FEMUR 2 VIEWS COMPARISON:  None Available. FINDINGS: Status post right hip total arthroplasty and right knee total arthroplasty. Acute, minimally displaced perihardware fracture of the intratrochanteric right femur about the femoral stem component, best appreciated on cross-table lateral view. No perihardware fracture about the knee. Soft tissues unremarkable. IMPRESSION: 1. Status post right hip total arthroplasty and right knee total arthroplasty. 2. Acute, minimally displaced perihardware fracture of the intratrochanteric right femur about the femoral stem component. 3.  No perihardware fracture about the knee. Electronically Signed   By: Delanna Ahmadi M.D.   On: 09/26/2022 18:06   DG Chest Port 1 View  Result Date: 09/26/2022 CLINICAL DATA:  [place]. EXAM: PORTABLE  CHEST 1 VIEW COMPARISON:  04/17/2018 FINDINGS: The cardiac silhouette, mediastinal and hilar contours are within normal limits and stable. No acute pulmonary findings. No pleural effusion or pneumothorax. The bony  thorax is intact. IMPRESSION: No acute cardiopulmonary findings. Electronically Signed   By: Marijo Sanes M.D.   On: 09/26/2022 18:04    Assessment and Plan:   #Chronic systolic heart failure, EF 25 to 30% -Echo from 2016 showed an EF of 40 to 45%.  It appears he has not followed with cardiology and has had no real symptoms of congestive heart failure.  He has been managed conservatively without issues.  An echocardiogram was obtained this admission however the quality is so poor I am unable to determine if his ejection fraction is truly 25-30%.  I recommended repeat echocardiogram with contrast to evaluate his LV function.  He has no symptoms of congestive heart failure.  His chest x-ray is clear.  His lungs are clear.  His EKG demonstrates sinus tachycardia which is related to his hip fracture and pain.  We will obtain this and follow-up tomorrow.   -For now continue carvedilol 3.125 mg twice daily.  I have added losartan 50 mg daily to help with his blood pressure.  BPs have been uncontrolled. -Further titration pending repeat echo.  #Hypertension -Continue Coreg 3.125 mg twice daily.  Add losartan 50 mg daily.  #PAD -Vascular ultrasound in 2021 concerned with left anterior tibial artery occlusion.  No symptoms of this.  He is on Plavix for this.  We will add low-dose Lipitor 20 mg daily.  For questions or updates, please contact [place] Please consult [email or web address] for contact info under   Signed, [place] T. Audie Box, Nathaniel [name], [place]  09/29/2022 3:54 PM

## 2022-09-29 NOTE — Progress Notes (Addendum)
PROGRESS NOTE  Nathaniel [name] HKV:[id number] DOB: Dec 15, 1934 DOA: 09/26/2022 PCP: Shirline Frees, MD   LOS: 2 days   Brief Narrative / Interim history: 87 year old male with HTN, RA, left eye blindness, chronic combined CHF, prior GI bleed, PAD on Plavix, history of right hip total arthroplasty comes into the hospital after a mechanical fall.  He was out in the yard blowing leaves, 1 hand was on the walker and the other 1 on the leaf blower, when he lost balance, fell to the ground and hit his head.  He came to the ED and imaging of the hip showed a periprosthetic fracture with soft tissue contusion and hematoma, and CT head without acute intracranial pathologies.  Orthopedic surgery consulted  Subjective / 24h Interval events: Seems to be more confused this morning.  Wants to get up and go help his wife blowing leaves  Assesement and Plan: Principal Problem:   Peri-prosthetic fracture around prosthetic hip Active Problems:   Rheumatoid arthritis ([place])   Chronic combined systolic and diastolic CHF (congestive heart failure) (HCC)   Essential hypertension   Hypokalemia   Hematoma of right hip   Traumatic hematoma of forehead   Fracture of spinous process of lumbar vertebra (HCC)   Incidental pulmonary nodule   Metabolic acidosis   Microscopic hematuria   Hip fracture (H[facility])   Principal problem Right hip periprosthetic fracture-orthopedic surgery consulted, Dr. Rhona Raider saw patient on 11/25, and fracture looks stable and hardware does not look compromised.  Recommends nonoperative management. -Mobilize with PT/OT, SNF pending  Active problems Right hip hematoma -discussed with orthopedic surgery, Plavix can be resumed, started today  Acute metabolic encephalopathy-patient alert and oriented x 4 yesterday, but more confused this morning.?  Medication induced.  Stop trazodone, baclofen, IV morphine  Right supraorbital and forehead swelling/hematoma -No underlying calvarial or facial  bone fracture seen on CT.   Subacute left L3 spinous process fracture -he denies back pain   Incidental pulmonary nodule -CT showing a 6 mm nodule in the right lung apex.  Patient is a former smoker.  Follow-up with CT chest as an outpatient in 6 to 12 months per radiology   Mild hypokalemia -continue to replace as indicated, monitor, potassium normalized today   Mild normal anion gap metabolic acidosis -resolved   Microscopic hematuria-asymptomatic.  Outpatient follow-up   Hypertension -continue amlodipine  Rheumatoid arthritis-On weekly Actemra and methotrexate, continue prednisone and sulfasalazine   Chronic combined CHF -No signs of volume overload.  Echo done June 2016 showing LVEF 40 to 56%, grade 1 diastolic dysfunction, and trivial AVR. Repeat echocardiogram ordered by admitting MD showing worsening EF EF of 43-32%, grade 1 diastolic dysfunction.  RV systolic function was also mildly reduced.  Cardiology consulted today, appreciate input  Peripheral arterial disease -resume Plavix today   BPH -Continue Flomax  Scheduled Meds:  ascorbic acid  500 mg Oral Daily   brimonidine  1 drop Both Eyes Q12H   carvedilol  3.125 mg Oral BID WC   cyanocobalamin  1,000 mcg Oral Daily   dorzolamide  1 drop Both Eyes BID   ferrous sulfate  325 mg Oral Q breakfast   folic acid  1 mg Oral QHS   predniSONE  5 mg Oral BID WC   sulfaSALAzine  1,000 mg Oral BID WC   tamsulosin  0.4 mg Oral QHS   traZODone  150 mg Oral QHS   Continuous Infusions: PRN Meds:.baclofen, HYDROcodone-acetaminophen, morphine injection, naLOXone (NARCAN)  injection  Current Outpatient Medications  Medication Instructions   Actemra ACTPen 162 mg, Subcutaneous, Every Thu   amLODipine (NORVASC) 10 mg, Oral, Daily   ascorbic acid (VITAMIN C) 500 mg, Oral, Daily   baclofen (LIORESAL) 10 mg, Oral, 2 times daily   brimonidine (ALPHAGAN) 0.2 % ophthalmic solution 1 drop, Both Eyes, 2 times daily   cetirizine (ZYRTEC) 10  mg, Oral, Daily   clopidogrel (PLAVIX) 75 mg, Oral, Daily   cyanocobalamin (VITAMIN B12) 1,000 mcg, Oral, Daily   diclofenac sodium (VOLTAREN) 1 % GEL 1 application , Topical, 2 times daily, Left knee and both feet   dorzolamide (TRUSOPT) 2 % ophthalmic solution 1 drop, Both Eyes, 2 times daily   ferrous sulfate 325 mg, Oral, Daily with breakfast   Fish Oil 1,000 mg, Oral, Daily   folic acid (FOLVITE) 1 mg, Oral, Daily at bedtime   gabapentin (NEURONTIN) 800 mg, Oral, 4 times daily   HYDROcodone-acetaminophen (NORCO/VICODIN) 5-325 MG tablet 1 tablet, Oral, 3 times daily   methotrexate 25 mg, Subcutaneous, Every Wed   polyethylene glycol (MIRALAX / GLYCOLAX) 17 g, Oral, Nightly   predniSONE (DELTASONE) 5 mg, Oral, 2 times daily   sulfaSALAzine (AZULFIDINE) 1,000 mg, Oral, 2 times daily with breakfast and lunch   tamsulosin (FLOMAX) 0.4 mg, Oral, Nightly   traZODone (DESYREL) 150 mg, Oral, Daily at bedtime   TURMERIC PO 1 capsule, Oral, Daily    Diet Orders (From admission, onward)     Start     Ordered   09/27/22 0816  Diet regular Room service appropriate? Yes; Fluid consistency: Thin  Diet effective now       Question Answer Comment  Room service appropriate? Yes   Fluid consistency: Thin      09/27/22 0815            DVT prophylaxis: SCDs Start: 09/26/22 2318   Lab Results  Component Value Date   PLT 141 (L) 09/28/2022      Code Status: Full Code  Family Communication: no family at bedside, tried calling wife twice, yesterday, today, no answer.  Left voicemail  Status is: Inpatient, awaiting SNF   Level of care: Med-Surg  Consultants:  Orthopedic surgery  Objective: Vitals:   09/28/22 2015 09/28/22 2231 09/29/22 0432 09/29/22 0852  BP: (!) 160/98 126/78 121/72 (!) 160/97  Pulse: 93 82 78 (!) 109  Resp: '18 18 16 17  '$ Temp: 98.7 F (37.1 C)  98.2 F (36.8 C) 98.2 F (36.8 C)  TempSrc: Oral  Oral Oral  SpO2: 97% 98% 97% 92%  Weight:      Height:         Intake/Output Summary (Last 24 hours) at 09/29/2022 1245 Last data filed at 09/29/2022 8099 Gross per 24 hour  Intake 500 ml  Output 850 ml  Net -350 ml    Wt Readings from Last 3 Encounters:  09/26/22 73.5 kg  04/24/19 85.1 kg  04/19/15 81.6 kg    Examination:  Constitutional: NAD Eyes: lids and conjunctivae normal, no scleral icterus ENMT: mmm Neck: normal, supple Respiratory: clear to auscultation bilaterally, no wheezing, no crackles. Normal respiratory effort.  Cardiovascular: Regular rate and rhythm, no murmurs / rubs / gallops. No LE edema. Abdomen: soft, no distention, no tenderness. Bowel sounds positive.  Skin: no rashes Neurologic: no focal deficits, equal strength  Data Reviewed: I have independently reviewed following labs and imaging studies   CBC Recent Labs  Lab 09/26/22 1739 09/26/22 1749 09/27/22 0315 09/28/22 0331  WBC 8.9  --  8.1 7.0  HGB 13.6 13.6 12.7* 12.4*  HCT 43.9 40.0 39.2 38.5*  PLT 151  --  143* 141*  MCV 109.2*  --  105.4* 107.2*  MCH 33.8  --  34.1* 34.5*  MCHC 31.0  --  32.4 32.2  RDW 14.5  --  14.5 14.8     Recent Labs  Lab 09/26/22 1739 09/26/22 1749 09/27/22 0315 09/28/22 0331  NA 143 141 144 141  K 3.5 3.3* 3.4* 4.2  CL 109 108 110 109  CO2 20*  --  25 24  GLUCOSE 103* 102* 112* 104*  BUN 8 7* 6* 7*  CREATININE 1.10 1.00 0.83 0.99  CALCIUM 9.4  --  9.2 8.8*  AST 24  --   --  19  ALT 14  --   --  14  ALKPHOS 71  --   --  56  BILITOT 0.4  --   --  0.7  ALBUMIN 3.9  --   --  3.2*  MG  --   --  1.8 1.8     ------------------------------------------------------------------------------------------------------------------ No results for input(s): "CHOL", "HDL", "LDLCALC", "TRIG", "CHOLHDL", "LDLDIRECT" in the last 72 hours.  Lab Results  Component Value Date   HGBA1C 6.3 (H) 04/20/2015   ------------------------------------------------------------------------------------------------------------------ No  results for input(s): "TSH", "T4TOTAL", "T3FREE", "THYROIDAB" in the last 72 hours.  Invalid input(s): "FREET3"  Cardiac Enzymes No results for input(s): "CKMB", "TROPONINI", "MYOGLOBIN" in the last 168 hours.  Invalid input(s): "CK" ------------------------------------------------------------------------------------------------------------------    Component Value Date/Time   BNP 63.6 02/16/2022 0147    CBG: No results for input(s): "GLUCAP" in the last 168 hours.  No results found for this or any previous visit (from the past 240 hour(s)).   Radiology Studies: No results found.   Marzetta Board, MD, PhD [facility]  Between 7 am - 7 pm I am available, please contact me via Amion (for emergencies) or Securechat (non urgent messages)  Between 7 pm - 7 am I am not available, please contact night coverage MD/APP via Amion

## 2022-09-29 NOTE — TOC Initial Note (Addendum)
Transition of Care [facility]) - Initial/Assessment Note    Patient Details  Name: Nathaniel [name] MRN: [id number] Date of Birth: 1935-06-18  [facility] CM/SW Contact:    Joanne Chars, LCSW Phone Number: 09/29/2022, 1:22 PM  Clinical Narrative:  Pt oriented x1, CSW spoke with wife Nathaniel [name] by phone.  She confirmed choice for SNF, bed offers presented and she would like to accept [facility].  Pt lives at home with wife, no current services.    1500: [facility].  CSW called to confirm bed, per Lexine Baton, they have another pt in the facility with the same name and they do not want to set up a confusing situation, will not be able to take pt.        1515: CSW called pt wife, updated her, she will accept offer at [facility].   Received confirmation that [facility] can take tomorrow.            Auth request submitted in [place].   Expected Discharge Plan: Skilled Nursing Facility Barriers to Discharge: Continued Medical Work up   Patient Goals and CMS Choice Patient states their goals for this hospitalization and ongoing recovery are:: return home with wife after rehab CMS Medicare.gov Compare Post Acute Care list provided to:: Patient Choice offered to / list presented to : Spouse (wife Nathaniel [name])  Expected Discharge Plan and Services Expected Discharge Plan: [place] In-house Referral: Clinical Social Work Discharge Planning Services: CM Consult Post Acute Care Choice: [place] arrangements for the past 2 months: [place]                                      Prior Living Arrangements/Services Living arrangements for the past 2 months: Single Family Home Lives with:: Spouse Patient language and need for interpreter reviewed:: Yes        Need for Family Participation in Patient Care: Yes (Comment) Care giver support system in place?: Yes (comment) Current home services: Other (comment) (none) Criminal Activity/Legal  Involvement Pertinent to Current Situation/Hospitalization: No - Comment as needed  Activities of Daily Living Home Assistive Devices/Equipment: Wheelchair ADL Screening (condition at time of admission) Patient's cognitive ability adequate to safely complete daily activities?: Yes Patient able to express need for assistance with ADLs?: Yes Does the patient have difficulty dressing or bathing?: Yes Independently performs ADLs?: No Communication: Independent Dressing (OT): Needs assistance Is this a change from baseline?: Pre-admission baseline Grooming: Needs assistance Weakness of Arms/Hands: Both  Permission Sought/Granted Permission sought to share information with : Family Supports Permission granted to share information with : Yes, Verbal Permission Granted  Share Information with NAME: Nathaniel [name]     Permission granted to share info w Relationship: wife  Permission granted to share info w Contact Information: [phone]  Emotional Assessment Appearance:: Appears stated age Attitude/Demeanor/Rapport: Engaged Affect (typically observed): Pleasant Orientation: : Oriented to Self Alcohol / Substance Use: Not Applicable Psych Involvement: No (comment)  Admission diagnosis:  Peri-prosthetic fracture around prosthetic hip [M97.8XXA, Z96.649] Periprosthetic fracture around internal prosthetic right hip joint, initial encounter ([place]) [id number]] Hip fracture ([place]) [G86.761P] Patient Active Problem List   Diagnosis Date Noted   Hip fracture ([place]) 09/27/2022   Peri-prosthetic fracture around prosthetic hip 09/26/2022   Hematoma of right hip 09/26/2022   Traumatic hematoma of forehead 09/26/2022   Fracture of spinous process of lumbar vertebra ([place]) 09/26/2022  Incidental pulmonary nodule 69/79/4801   Metabolic acidosis 65/53/7482   Microscopic hematuria 09/26/2022   Hematoma 02/15/2022   Essential hypertension 02/15/2022   Peripheral arterial disease ([place]) 02/15/2022    Hypokalemia 02/15/2022   Hematemesis with nausea    Melena    Acute upper GI bleed 04/21/2019   Acute upper GI bleeding 04/21/2019   Rheumatoid arthritis ([place]) 04/21/2019   Chronic pain 04/21/2019   Chronic combined systolic and diastolic CHF (congestive heart failure) ([place]) 04/21/2019   Macrocytic anemia 04/21/2019   Generalized weakness    Altered mental status 04/17/2018   AKI (acute kidney injury) ([place]) 04/17/2018   Dizziness    Leg weakness 04/19/2015   Osteoarthritis of right knee 12/19/2011   PCP:  Shirline Frees, MD Pharmacy:   [id number] [place]: [phone]: [address] [address]Phone: [phone]: [phone]    Social Determinants of Health (SDOH) Interventions    Readmission Risk Interventions     No data to display

## 2022-09-29 NOTE — Progress Notes (Signed)
Patient's wife at bedside and upset that patient is confused. Wife asking about the medications that he is taking. RN allowed wife to look over ordered medications. RN also sent secure chat to Dr. Cruzita Lederer and made MD aware that patient's wife is upset that patient is confused and that she is stating "y'all aren't giving him the right medicines for him to be acting so different." Wife is stating that staff does not seem to care that he is confused. RN informed wife that Dr. Cruzita Lederer has been contacted of her concerns. RN also made MD aware via secure chat that patient complains of indigestion. This RN listened to all of wife's concerns and made her aware that the doctor is aware of her concerns. Patient's wife also upset that patient did not eat lunch. RN made wife aware that patient was given coffee this morning and made per her instructions and offered donut that was in room and that patient refused. Patient was also offered lunch and patient declined.

## 2022-09-29 NOTE — Progress Notes (Signed)
Physical Therapy Treatment Patient Details Name: Nathaniel [name] MRN: [id number] DOB: 1935/07/27 Today's Date: 09/29/2022   History of Present Illness Pt is a 87 yr old male who presented 11/24 due to a fall at home while using a leaf blower and holding onto his walker with one hand. CT of pelvis showed minimal displaced perihardware fx  and CT of head showed hematoma of head without fx. PMH: hypertension, GERD, rheumatoid arthritis, chronic pain, glaucoma with left eye blindness, chronic combined CHF, history of GI bleed, Rt THA x3, TKA.    PT Comments    Patient more confused today and stating he is at home, re-oriented however pt unable to retain information. Pt denied pain at rest but noted to have discomfort at Rt hip with mobility. Pt required increased assist today of Max/Total Assist +2 for safety with supine>sit EOB. Pt has strong posterior lean in sitting and extending Lt LE requiring Max Assist to block Lt foot and maintain stable placement on floor. Pt unable to safely sit EOB for >15 seconds due to posterior lean and Total Assist +2 to return to supine. Pt bed soiled with urine and RN/NT present to assist with bed linen change. Pt required Max/Total assist for rolling. Recommend SNF rehab for follow up. Will progress as able.   Recommendations for follow up therapy are one component of a multi-disciplinary discharge planning process, led by the attending physician.  Recommendations may be updated based on patient status, additional functional criteria and insurance authorization.  Follow Up Recommendations  Skilled nursing-short term rehab (<3 hours/day) Can patient physically be transported by private vehicle: No   Assistance Recommended at Discharge Frequent or constant Supervision/Assistance  Patient can return home with the following Two people to help with walking and/or transfers;Two people to help with bathing/dressing/bathroom;Direct supervision/assist for medications  management;Assist for transportation;Help with stairs or ramp for entrance   Equipment Recommendations  None recommended by PT    Recommendations for Other Services       Precautions / Restrictions Precautions Precautions: Fall (Pt reported 2-3 falls and does not know when B knees may give out) Restrictions Weight Bearing Restrictions: Yes RLE Weight Bearing: Weight bearing as tolerated     Mobility  Bed Mobility Overal bed mobility: Needs Assistance Bed Mobility: Supine to Sit, Sit to Supine     Supine to sit: HOB elevated, Max assist, Total assist, +2 for physical assistance, +2 for safety/equipment Sit to supine: Max assist, +2 for physical assistance, +2 for safety/equipment, Total assist   General bed mobility comments: assist due to limited ROM and difficulty reaching to bed rail. cues to sequence and assist to bring LE's off/onto EOB.    Transfers                   General transfer comment: unable to safely sit EOB today    Ambulation/Gait                   Stairs             Wheelchair Mobility    Modified Rankin (Stroke Patients Only)       Balance Overall balance assessment: Needs assistance Sitting-balance support: Feet supported, Bilateral upper extremity supported Sitting balance-Leahy Scale: Poor Sitting balance - Comments: HEAVY Posterior Lean Postural control: Posterior lean  Cognition Arousal/Alertness: Awake/alert Behavior During Therapy: WFL for tasks assessed/performed Overall Cognitive Status: Within Functional Limits for tasks assessed                                          Exercises      General Comments        Pertinent Vitals/Pain Pain Assessment Pain Assessment: Faces Faces Pain Scale: Hurts little more Pain Location: B knees Pain Descriptors / Indicators: Aching Pain Intervention(s): Limited activity within patient's tolerance,  Monitored during session, Repositioned    Home Living                          Prior Function            PT Goals (current goals can now be found in the care plan section) Acute Rehab PT Goals Patient Stated Goal: stop hurting, get better PT Goal Formulation: With patient/family Time For Goal Achievement: 10/11/22 Potential to Achieve Goals: Fair Progress towards PT goals: Progressing toward goals    Frequency    Min 3X/week      PT Plan Current plan remains appropriate    Co-evaluation              AM-PAC PT "6 Clicks" Mobility   Outcome Measure  Help needed turning from your back to your side while in a flat bed without using bedrails?: Total Help needed moving from lying on your back to sitting on the side of a flat bed without using bedrails?: Total Help needed moving to and from a bed to a chair (including a wheelchair)?: Total Help needed standing up from a chair using your arms (e.g., wheelchair or bedside chair)?: Total Help needed to walk in hospital room?: Total Help needed climbing 3-5 steps with a railing? : Total 6 Click Score: 6    End of Session Equipment Utilized During Treatment: Gait belt Activity Tolerance: Patient limited by pain Patient left: in bed;with call bell/phone within reach;with bed alarm set;with family/visitor present Nurse Communication: Mobility status PT Visit Diagnosis: Unsteadiness on feet (R26.81);Other abnormalities of gait and mobility (R26.89);History of falling (Z91.81);Muscle weakness (generalized) (M62.81);Difficulty in walking, not elsewhere classified (R26.2)     Time: 8381-8403 PT Time Calculation (min) (ACUTE ONLY): 23 min  Charges:  $Therapeutic Activity: 8-22 mins                     Verner Mould, DPT Acute Rehabilitation Services Office [phone]  09/29/22 12:31 PM

## 2022-09-29 NOTE — Progress Notes (Signed)
  Echocardiogram 2D Echocardiogram has been performed.  Nathaniel [name] 09/29/2022, 4:18 PM

## 2022-09-30 ENCOUNTER — Inpatient Hospital Stay (HOSPITAL_COMMUNITY): Payer: Medicare Other

## 2022-09-30 DIAGNOSIS — I5022 Chronic systolic (congestive) heart failure: Secondary | ICD-10-CM | POA: Diagnosis not present

## 2022-09-30 DIAGNOSIS — I6330 Cerebral infarction due to thrombosis of unspecified cerebral artery: Secondary | ICD-10-CM

## 2022-09-30 LAB — COMPREHENSIVE METABOLIC PANEL
ALT: 20 U/L (ref 0–44)
AST: 34 U/L (ref 15–41)
Albumin: 4.3 g/dL (ref 3.5–5.0)
Alkaline Phosphatase: 52 U/L (ref 38–126)
Anion gap: 10 (ref 5–15)
BUN: 13 mg/dL (ref 8–23)
CO2: 22 mmol/L (ref 22–32)
Calcium: 9.3 mg/dL (ref 8.9–10.3)
Chloride: 103 mmol/L (ref 98–111)
Creatinine, Ser: 0.96 mg/dL (ref 0.61–1.24)
GFR, Estimated: >60 mL/min (ref >60)
Glucose, Bld: 124 mg/dL — ABNORMAL HIGH (ref 70–99)
Potassium: 3.7 mmol/L (ref 3.5–5.1)
Sodium: 135 mmol/L (ref 135–145)
Total Bilirubin: 1.5 mg/dL — ABNORMAL HIGH (ref 0.3–1.2)
Total Protein: 6.3 g/dL — ABNORMAL LOW (ref 6.5–8.1)

## 2022-09-30 LAB — CBC
HCT: 42.9 % (ref 39.0–52.0)
Hemoglobin: 14.1 g/dL (ref 13.0–17.0)
MCH: 34.1 pg — ABNORMAL HIGH (ref 26.0–34.0)
MCHC: 32.9 g/dL (ref 30.0–36.0)
MCV: 103.6 fL — ABNORMAL HIGH (ref 80.0–100.0)
Platelets: 178 10*3/uL (ref 150–400)
RBC: 4.14 MIL/uL — ABNORMAL LOW (ref 4.22–5.81)
RDW: 14.1 % (ref 11.5–15.5)
WBC: 15.2 10*3/uL — ABNORMAL HIGH (ref 4.0–10.5)
nRBC: 0 % (ref 0.0–0.2)

## 2022-09-30 LAB — URINALYSIS, ROUTINE W REFLEX MICROSCOPIC
Bilirubin Urine: NEGATIVE
Glucose, UA: NEGATIVE mg/dL
Hgb urine dipstick: NEGATIVE
Ketones, ur: 5 mg/dL — AB
Leukocytes,Ua: NEGATIVE
Nitrite: NEGATIVE
Protein, ur: 30 mg/dL — AB
Specific Gravity, Urine: 1.021 (ref 1.005–1.030)
pH: 5 (ref 5.0–8.0)

## 2022-09-30 LAB — VITAMIN B12: Vitamin B-12: 1977 pg/mL — ABNORMAL HIGH (ref 180–914)

## 2022-09-30 LAB — MAGNESIUM: Magnesium: 1.9 mg/dL (ref 1.7–2.4)

## 2022-09-30 LAB — AMMONIA: Ammonia: 22 umol/L (ref 9–35)

## 2022-09-30 MED ORDER — IOHEXOL 350 MG/ML SOLN
75.0000 mL | Freq: Once | INTRAVENOUS | Status: AC | PRN
  Administered 2022-09-30: 75 mL via INTRAVENOUS

## 2022-09-30 NOTE — Plan of Care (Incomplete)
Off the floor at 2045 for CTA and back on the

## 2022-09-30 NOTE — Consult Note (Addendum)
Neurology Consultation Reason for Consult: acute/subacute CVA Requesting Physician: Dr. Cruzita Lederer  CC: new onset confusion w/ acute/subacute CVA on MRI  History is obtained from: Patient and Chart  HPI: Nathaniel [name] is a 87 y.o. male with PMHx of HTN, GERD, RA, chronic pain, glaucoma with left eye blidnness, plavix for small vessel cerebrovascular disease, chronic combined CHF (LVEF 55-60%), BPH, PAD, hx of GI bleed, presenting to [facility] ED for right hip pain and impact to the head caused by mechanical fall. Yesterday morning (11/27),due to persistent confusion MRI brain was ordered which revealed an incidental infarct.   On evaluation, patient is unable to provide accurate history of events leading up to his hospitalization, wife is present at bedside and she is able to provide further details. Reports that patient's ,mentation at baseline is "sharp", however he has mobility deficits that do not allow him to carry out ADLs. He is insistent on helping around the house, which is what caused his fall. Denies any history of delirium in previous hospitalizations or any episodes of confusion at home. Wife provides his medication at home and confirms patient has not missed any doses of Plavix. She noted confusion onset Sunday afternoon.  He is seen by Dr. Doy Hutching for outpatient neurology in [facility], with normal mental status on previous examinations.   LKW: 11/26 morning Thrombolytic given?: No, outside of window IA performed?: No Premorbid modified rankin scale:      4 - Moderately severe disability. Unable to attend to own bodily needs without assistance, and unable to walk unassisted.     ROS: All other review of systems was negative except as noted in the HPI. Unable to obtain due to altered mental status.   Past Medical History:  Diagnosis Date   AKI (acute kidney injury) ([place]) 04/17/2018   Altered mental status 04/17/2018   Arthritis    Blood transfusion 2005   s/p hip replacement   Colon  polyps    Environmental allergies    pollen , leaves, grass   Erectile dysfunction    Glaucoma of left eye    legally blind in left eye   Hypertension    Past Surgical History:  Procedure Laterality Date   CATARACT EXTRACTION     bilateral   ESOPHAGOGASTRODUODENOSCOPY (EGD) WITH PROPOFOL N/A 04/22/2019   Procedure: ESOPHAGOGASTRODUODENOSCOPY (EGD) WITH PROPOFOL;  Surgeon: Ladene Artist, MD;  Location: [facility];  Service: Endoscopy;  Laterality: N/A;   EYE SURGERY     GLAUCOMA VALVE INSERTION  2010,2011   Left eye   HIP ARTHROPLASTY  2005   Right Hip replacement x 3   HOT HEMOSTASIS N/A 04/22/2019   Procedure: HOT HEMOSTASIS (ARGON PLASMA COAGULATION/BICAP);  Surgeon: Ladene Artist, MD;  Location: [facility];  Service: Endoscopy;  Laterality: N/A;   JOINT REPLACEMENT     MULTIPLE TOOTH EXTRACTIONS  2012   had 23 teeth pulled   TOENAIL EXCISION     TOTAL KNEE ARTHROPLASTY  12/15/2011   Procedure: TOTAL KNEE ARTHROPLASTY;  Surgeon: Kerin Salen, MD;  Location: [facility];  Service: Orthopedics;  Laterality: Right;  DEPUY SIGMA    Current Outpatient Medications  Medication Instructions   Actemra ACTPen 162 mg, Subcutaneous, Every Thu   amLODipine (NORVASC) 10 mg, Oral, Daily   ascorbic acid (VITAMIN C) 500 mg, Oral, Daily   baclofen (LIORESAL) 10 mg, Oral, 2 times daily   brimonidine (ALPHAGAN) 0.2 % ophthalmic solution 1 drop, Both Eyes, 2 times daily   cetirizine (ZYRTEC)  10 mg, Oral, Daily   clopidogrel (PLAVIX) 75 mg, Oral, Daily   cyanocobalamin (VITAMIN B12) 1,000 mcg, Oral, Daily   diclofenac sodium (VOLTAREN) 1 % GEL 1 application , Topical, 2 times daily, Left knee and both feet   dorzolamide (TRUSOPT) 2 % ophthalmic solution 1 drop, Both Eyes, 2 times daily   ferrous sulfate 325 mg, Oral, Daily with breakfast   Fish Oil 1,000 mg, Oral, Daily   folic acid (FOLVITE) 1 mg, Oral, Daily at bedtime   gabapentin (NEURONTIN) 800 mg, Oral, 4 times daily    HYDROcodone-acetaminophen (NORCO/VICODIN) 5-325 MG tablet 1 tablet, Oral, 3 times daily   methotrexate 25 mg, Subcutaneous, Every Wed   polyethylene glycol (MIRALAX / GLYCOLAX) 17 g, Oral, Nightly   predniSONE (DELTASONE) 5 mg, Oral, 2 times daily   sulfaSALAzine (AZULFIDINE) 1,000 mg, Oral, 2 times daily with breakfast and lunch   tamsulosin (FLOMAX) 0.4 mg, Oral, Nightly   traZODone (DESYREL) 150 mg, Oral, Daily at bedtime   TURMERIC PO 1 capsule, Oral, Daily     History reviewed. No pertinent family history.   Social History:  reports that he quit smoking about 23 years ago. His smoking use included cigarettes. He has a 10.00 pack-year smoking history. He has quit using smokeless tobacco.  His smokeless tobacco use included chew. He reports current alcohol use. He reports that he does not use drugs.   Exam: Current vital signs: BP (!) 143/109 (BP Location: Left Arm)   Pulse (!) 106   Temp 98.3 F (36.8 C) (Oral)   Resp 16   Ht '5\' 10"'$  (1.778 m)   Wt 73.5 kg   SpO2 99%   BMI 23.24 kg/m  Vital signs in last 24 hours: Temp:  [98.2 F (36.8 C)-99.2 F (37.3 C)] 98.3 F (36.8 C) (11/28 0426) Pulse Rate:  [96-109] 106 (11/28 0426) Resp:  [16-17] 16 (11/28 0426) BP: (127-160)/(79-109) 143/109 (11/28 0426) SpO2:  [92 %-99 %] 99 % (11/28 0426)   Physical Exam  Constitutional: Appears thin, no acute distress Psych: Affect appropriate to situation, no apparent delusional or paranoid thought processes. Associations are somewhat tangential and disorganized. HENT: No oropharyngeal obstruction. Right periorbital bruising and forehead hematoma  MSK: Diffuse arthritic changes Respiratory: Effort normal, non-labored breathing GI: Soft.  No distension. There is no tenderness.  Skin: Right hip hematoma  Neuro: Mental Status: Patient is awake, alert, oriented to name. Not oriented to age and year. States he is at home, but is oriented to city,  Patient is unable to provide a clear  and coherent history.  Fluency, repetition, and naming intact. Has difficulty following three step commands. Recalls 1/3 words.  On MD exam, calls his thumb and his pinkie finger his tibia  Cranial Nerves: II: Visual Fields are diminished at periphery on right particularly right lower quadrant, with left eye blindness consistent with history.  III,IV, VI: EOMI without ptosis or diploplia, saccadic pursuits V: Facial sensation is symmetric to light touch VII: Facial movement is symmetric.  VIII: hearing is intact to voice X: Uvula elevates symmetrically XI: Shoulder shrug is symmetric. XII: tongue is midline without atrophy or fasciculations.   Motor: Tone and bulk are normal UE Strength  Right  Left Deltoids  -  - Triceps   4/5  4/5 Biceps   4/5  4/5 Wrist FL  -  - Wrist Ext  -  - Finger grip  3/5  3/5  limited by arthritic changes LE Strength  Right  Left  Hip Flex  4/5  4/5 Knee flexed  3/5  5/5 Knee X  3/5  5/5 Dorsiflexion  5/5  5/5 Plantarflexion  5/5  5/5 On attending eval, pain limited exam especially of the right hip which he does not lift antigravity. Left leg with some slight drift.  Sensory: Sensation is symmetric, however given patient's mentation, he confused his right and left during the examination.  Deep Tendon Reflexes: Hypoactive throughout Plantars: Toes are downgoing bilaterally.  Cerebellar: Finger to nose intact, heel to shin deferred due to pain Gait:  Deferred in acute setting  NIHSS total 5  Score breakdown: 1 for incorrectly stating age is 1, one for left leg drift, 3 for right leg no effort against gravity (pain limited) Performed at 7 PM  11/28    I have reviewed labs in epic and the results pertinent to this consultation are:  Basic Metabolic Panel: Recent Labs  Lab 09/26/22 1739 09/26/22 1749 09/27/22 0315 09/28/22 0331 09/30/22 0422  NA 143 141 144 141 135  K 3.5 3.3* 3.4* 4.2 3.7  CL 109 108 110 109 103  CO2 20*  --  '25 24 22   '$ GLUCOSE 103* 102* 112* 104* 124*  BUN 8 7* 6* 7* 13  CREATININE 1.10 1.00 0.83 0.99 0.96  CALCIUM 9.4  --  9.2 8.8* 9.3  MG  --   --  1.8 1.8 1.9    CBC: Recent Labs  Lab 09/26/22 1739 09/26/22 1749 09/27/22 0315 09/28/22 0331 09/30/22 0422  WBC 8.9  --  8.1 7.0 15.2*  HGB 13.6 13.6 12.7* 12.4* 14.1  HCT 43.9 40.0 39.2 38.5* 42.9  MCV 109.2*  --  105.4* 107.2* 103.6*  PLT 151  --  143* 141* 178    Coagulation Studies: No results for input(s): "LABPROT", "INR" in the last 72 hours.   Lab Results  Component Value Date   SPQZRAQT62 2,633 (H) 09/30/2022    Lab Results  Component Value Date   TSH 5.548 (H) 09/28/2022   Lab Results  Component Value Date   HGBA1C 6.3 (H) 04/20/2015   Lab Results  Component Value Date   CHOL 160 04/20/2015   HDL 41 04/20/2015   LDLCALC 103 (H) 04/20/2015   TRIG 78 04/20/2015   CHOLHDL 3.9 04/20/2015    I have reviewed the images obtained:  MRI brain personally reviewed, agree with radiology:   1. Small acute/early subacute infarct of the left radiata. No hemorrhage or mass effect. 2. Multiple old cerebellar infarcts and findings of chronic small vessel ischemia. 3. Right frontal scalp hematoma  ECHO 11/25  1. Images are limited.   2. Left ventricular ejection fraction, by estimation, is 25 to 30%. The  left ventricle has severely decreased function. Left ventricular  endocardial border not optimally defined to evaluate regional wall motion.  There is mild left ventricular  hypertrophy. Left ventricular diastolic parameters are consistent with  Grade I diastolic dysfunction (impaired relaxation).   3. Right ventricular systolic function is mildly reduced. The right  ventricular size is normal. Tricuspid regurgitation signal is inadequate  for assessing PA pressure.   4. The mitral valve is degenerative. Trivial mitral valve regurgitation.   5. The aortic valve is tricuspid. Aortic valve regurgitation is not  visualized.  Aortic valve sclerosis is present, with no evidence of aortic  valve stenosis. Aortic valve mean gradient measures 2.0 mmHg.   6. Unable to estimate CVP.  [Normal biatrial sizes] ECHO 11/27 Comments "Technically challenging study due  to limited  acoustic windows. Image acquisition challenging due to uncooperative  patient and Image acquisition challenging due to respiratory motion.  Patient was confused during scan, kept grabbing  my arm while stateing he was falling backwards "  1. Limited echo with Definity, only the LV was imaged. Difficult images.  LV EF appears to be 55-60% (normal) with no obvious wall motion  abnormalities.   Impression:  87 yo male with PMHX of  HTN, GERD, RA, chronic pain, glaucoma with left eye blidnness, plavix for small vessel cerebrovascular disease, chronic combined CHF (LVEF 55-60%), BPH, PAD, hx of GI bleed, presenting to [facility] ED for right hip pain and impact to the head caused by mechanical fall, now with acute changes in mental status and found to have small acute/subacute infarct at L cerebral hemisphere.   Favor mental status changes being related to hospital associated delirium though there could be some language impairment from his small stroke, given overall atrophy on MRI query underlying mild vascular dementia well compensated in his home bound status.   Recommendations:  # Left corona radiata stroke, likely secondary to small vessel disease - Stroke labs HgbA1c, fasting lipid panel ordered - CTA head and neck ordered  - Frequent neuro checks - Echocardiogram already completed as above - Continue home plavix 75 mg daily at this time, will NOT add aspirin given bleeding risk as patient also on sulfasalazine - Risk factor modification - Telemetry monitoring - Blood pressure goal   - Normotension - PT consult, OT consult, Speech consult, - Consider outpatient neurocognitive evaluation  - Stroke team to follow   Christene Slates,  MD PGY-1   Attending Neurologist's note:  I personally saw this patient, gathering history, performing a full neurologic examination, reviewing relevant labs, personally reviewing relevant imaging including MRI brain, and formulated the assessment and plan, adding the note above for completeness and clarity to accurately reflect my thoughts   Lesleigh Noe MD-PhD [facility] [phone] Available 7 AM to 7 PM, outside these hours please contact Neurologist on call listed on AMION

## 2022-09-30 NOTE — Progress Notes (Signed)
PROGRESS NOTE  Nathaniel [name] OIZ:[id number] DOB: Dec 13, 1934 DOA: 09/26/2022 PCP: Shirline Frees, MD   LOS: 3 days   Brief Narrative / Interim history: 87 year old male with HTN, RA, left eye blindness, chronic combined CHF, prior GI bleed, PAD on Plavix, history of right hip total arthroplasty comes into the hospital after a mechanical fall.  He was out in the yard blowing leaves, 1 hand was on the walker and the other 1 on the leaf blower, when he lost balance, fell to the ground and hit his head.  He came to the ED and imaging of the hip showed a periprosthetic fracture with soft tissue contusion and hematoma, and CT head without acute intracranial pathologies.  Orthopedic surgery consulted  Subjective / 24h Interval events: Still confused today, but less than yesterday.  Thinks he is in rehab, but knows the year is 2023.  Assesement and Plan: Principal Problem:   Peri-prosthetic fracture around prosthetic hip Active Problems:   Rheumatoid arthritis (HCC)   Chronic combined systolic and diastolic CHF (congestive heart failure) (HCC)   Essential hypertension   Hypokalemia   Hematoma of right hip   Traumatic hematoma of forehead   Fracture of spinous process of lumbar vertebra (HCC)   Incidental pulmonary nodule   Metabolic acidosis   Microscopic hematuria   Hip fracture (HCC)   Principal problem Right hip periprosthetic fracture, right hip hematoma-orthopedic surgery consulted, Dr. Rhona Raider saw patient on 11/25, and fracture looks stable and hardware does not look compromised.  Recommends nonoperative management. -Mobilize with PT/OT, SNF pending -Discussed with orthopedic surgery, Plavix was resumed yesterday  Active problems Acute metabolic encephalopathy-patient alert and oriented x 4 on Sunday, but had increased confusion on Monday.  His baclofen, gabapentin, trazodone all discontinued.  Despite that, remained confused and underwent an MRI last night which showed  acute/subacute CVA  Acute CVA-MRI of the brain done on 11/27 shows small acute/early subacute infarct of the left radiata without hemorrhage or mass effect.  It also showed multiple old cerebellar infarcts and findings of chronic small vessel ischemia.  Neurology consulted, appreciate input.  Right supraorbital and forehead swelling/hematoma -No underlying calvarial or facial bone fracture seen on CT.   Subacute left L3 spinous process fracture -he denies back pain   Incidental pulmonary nodule -CT showing a 6 mm nodule in the right lung apex.  Patient is a former smoker.  Follow-up with CT chest as an outpatient in 6 to 12 months per radiology   Mild hypokalemia -potassium normalized, continue to monitor   Mild normal anion gap metabolic acidosis -resolved   Microscopic hematuria-asymptomatic.  Outpatient follow-up   Hypertension -has been placed on Coreg as well as losartan, home amlodipine has been held  Rheumatoid arthritis-On weekly Actemra and methotrexate, continue prednisone and sulfasalazine   Chronic combined CHF -No signs of volume overload.  Echo done June 2016 showing LVEF 40 to 33%, grade 1 diastolic dysfunction, and trivial AVR. Repeat echocardiogram ordered by admitting MD showing worsening EF EF of 82-50%, grade 1 diastolic dysfunction.  Cardiology consulted, felt like the echo done on admission had very poor windows, and it was repeated yesterday with Definity contrast, showing actually normal EF.  Peripheral arterial disease -continue Plavix   BPH -Continue Flomax  Scheduled Meds:  ascorbic acid  500 mg Oral Daily   atorvastatin  20 mg Oral Daily   brimonidine  1 drop Both Eyes Q12H   carvedilol  3.125 mg Oral BID WC   clopidogrel  75  mg Oral Daily   cyanocobalamin  1,000 mcg Oral Daily   dorzolamide  1 drop Both Eyes BID   ferrous sulfate  325 mg Oral Q breakfast   folic acid  1 mg Oral QHS   losartan  50 mg Oral Daily   predniSONE  5 mg Oral BID WC    sulfaSALAzine  1,000 mg Oral BID WC   tamsulosin  0.4 mg Oral QHS   Continuous Infusions: PRN Meds:.alum & mag hydroxide-simeth, HYDROcodone-acetaminophen, naLOXone (NARCAN)  injection  Current Outpatient Medications  Medication Instructions   Actemra ACTPen 162 mg, Subcutaneous, Every Thu   amLODipine (NORVASC) 10 mg, Oral, Daily   ascorbic acid (VITAMIN C) 500 mg, Oral, Daily   baclofen (LIORESAL) 10 mg, Oral, 2 times daily   brimonidine (ALPHAGAN) 0.2 % ophthalmic solution 1 drop, Both Eyes, 2 times daily   cetirizine (ZYRTEC) 10 mg, Oral, Daily   clopidogrel (PLAVIX) 75 mg, Oral, Daily   cyanocobalamin (VITAMIN B12) 1,000 mcg, Oral, Daily   diclofenac sodium (VOLTAREN) 1 % GEL 1 application , Topical, 2 times daily, Left knee and both feet   dorzolamide (TRUSOPT) 2 % ophthalmic solution 1 drop, Both Eyes, 2 times daily   ferrous sulfate 325 mg, Oral, Daily with breakfast   Fish Oil 1,000 mg, Oral, Daily   folic acid (FOLVITE) 1 mg, Oral, Daily at bedtime   gabapentin (NEURONTIN) 800 mg, Oral, 4 times daily   HYDROcodone-acetaminophen (NORCO/VICODIN) 5-325 MG tablet 1 tablet, Oral, 3 times daily   methotrexate 25 mg, Subcutaneous, Every Wed   polyethylene glycol (MIRALAX / GLYCOLAX) 17 g, Oral, Nightly   predniSONE (DELTASONE) 5 mg, Oral, 2 times daily   sulfaSALAzine (AZULFIDINE) 1,000 mg, Oral, 2 times daily with breakfast and lunch   tamsulosin (FLOMAX) 0.4 mg, Oral, Nightly   traZODone (DESYREL) 150 mg, Oral, Daily at bedtime   TURMERIC PO 1 capsule, Oral, Daily    Diet Orders (From admission, onward)     Start     Ordered   09/27/22 0816  Diet regular Room service appropriate? Yes; Fluid consistency: Thin  Diet effective now       Question Answer Comment  Room service appropriate? Yes   Fluid consistency: Thin      09/27/22 0815            DVT prophylaxis: SCDs Start: 09/26/22 2318   Lab Results  Component Value Date   PLT 178 09/30/2022      Code  Status: Full Code  Family Communication: no family at bedside, tried calling wife twice, yesterday, today, no answer.  Left voicemail  Status is: Inpatient, awaiting SNF   Level of care: Med-Surg  Consultants:  Orthopedic surgery  Objective: Vitals:   09/29/22 0852 09/29/22 1525 09/29/22 1950 09/30/22 0426  BP: (!) 160/97 (!) 149/106 127/79 (!) 143/109  Pulse: (!) 109 100 96 (!) 106  Resp: '17 17 16 16  '$ Temp: 98.2 F (36.8 C) 99.2 F (37.3 C)  98.3 F (36.8 C)  TempSrc: Oral Oral  Oral  SpO2: 92% 98% 99% 99%  Weight:      Height:        Intake/Output Summary (Last 24 hours) at 09/30/2022 1145 Last data filed at 09/29/2022 1400 Gross per 24 hour  Intake 90 ml  Output --  Net 90 ml    Wt Readings from Last 3 Encounters:  09/26/22 73.5 kg  04/24/19 85.1 kg  04/19/15 81.6 kg    Examination: Constitutional: NAD Eyes:  lids and conjunctivae normal, no scleral icterus ENMT: mmm Neck: normal, supple Respiratory: clear to auscultation bilaterally, no wheezing, no crackles. Normal respiratory effort.  Cardiovascular: Regular rate and rhythm, no murmurs / rubs / gallops. No LE edema. Abdomen: soft, no distention, no tenderness. Bowel sounds positive.  Skin: no rashes Neurologic: no focal deficits, equal strength  Data Reviewed: I have independently reviewed following labs and imaging studies   CBC Recent Labs  Lab 09/26/22 1739 09/26/22 1749 09/27/22 0315 09/28/22 0331 09/30/22 0422  WBC 8.9  --  8.1 7.0 15.2*  HGB 13.6 13.6 12.7* 12.4* 14.1  HCT 43.9 40.0 39.2 38.5* 42.9  PLT 151  --  143* 141* 178  MCV 109.2*  --  105.4* 107.2* 103.6*  MCH 33.8  --  34.1* 34.5* 34.1*  MCHC 31.0  --  32.4 32.2 32.9  RDW 14.5  --  14.5 14.8 14.1     Recent Labs  Lab 09/26/22 1739 09/26/22 1749 09/27/22 0315 09/28/22 0326 09/28/22 0331 09/30/22 0422  NA 143 141 144  --  141 135  K 3.5 3.3* 3.4*  --  4.2 3.7  CL 109 108 110  --  109 103  CO2 20*  --  25  --  24 22   GLUCOSE 103* 102* 112*  --  104* 124*  BUN 8 7* 6*  --  7* 13  CREATININE 1.10 1.00 0.83  --  0.99 0.96  CALCIUM 9.4  --  9.2  --  8.8* 9.3  AST 24  --   --   --  19 34  ALT 14  --   --   --  14 20  ALKPHOS 71  --   --   --  56 52  BILITOT 0.4  --   --   --  0.7 1.5*  ALBUMIN 3.9  --   --   --  3.2* 4.3  MG  --   --  1.8  --  1.8 1.9  TSH  --   --   --  5.548*  --   --   AMMONIA  --   --   --   --   --  22     ------------------------------------------------------------------------------------------------------------------ No results for input(s): "CHOL", "HDL", "LDLCALC", "TRIG", "CHOLHDL", "LDLDIRECT" in the last 72 hours.  Lab Results  Component Value Date   HGBA1C 6.3 (H) 04/20/2015   ------------------------------------------------------------------------------------------------------------------ Recent Labs    09/28/22 0326  TSH 5.548*    Cardiac Enzymes No results for input(s): "CKMB", "TROPONINI", "MYOGLOBIN" in the last 168 hours.  Invalid input(s): "CK" ------------------------------------------------------------------------------------------------------------------    Component Value Date/Time   BNP 63.6 02/16/2022 0147    CBG: No results for input(s): "GLUCAP" in the last 168 hours.  No results found for this or any previous visit (from the past 240 hour(s)).   Radiology Studies: DG CHEST PORT 1 VIEW  Result Date: 09/30/2022 CLINICAL DATA:  660630 Leukocytosis 100030 EXAM: PORTABLE CHEST - 1 VIEW COMPARISON:  09/26/2022. FINDINGS: Unremarkable cardiac silhouette. Stable biapical pleural thickening. Lungs are otherwise clear. No pneumothorax or pleural effusion. There are thoracic degenerative changes. Note is made of gaseous distention of stomach. IMPRESSION: No acute cardiopulmonary disease. Electronically Signed   By: Sammie Bench M.D.   On: 09/30/2022 08:17   MR BRAIN WO CONTRAST  Result Date: 09/30/2022 CLINICAL DATA:  Altered mental status  EXAM: MRI HEAD WITHOUT CONTRAST TECHNIQUE: Multiplanar, multiecho pulse sequences of the brain and surrounding structures were  obtained without intravenous contrast. COMPARISON:  None Available. FINDINGS: Brain: There is a small acute/early subacute infarct of the left radiata. No acute or chronic hemorrhage. There is multifocal hyperintense T2-weighted signal within the white matter. There is advanced atrophy. There are multiple old cerebellar infarcts. The midline structures are normal. Vascular: Major flow voids are preserved. Skull and upper cervical spine: Right frontal scalp hematoma Sinuses/Orbits:No paranasal sinus fluid levels or advanced mucosal thickening. No mastoid or middle ear effusion. Normal orbits. IMPRESSION: 1. Small acute/early subacute infarct of the left radiata. No hemorrhage or mass effect. 2. Multiple old cerebellar infarcts and findings of chronic small vessel ischemia. 3. Right frontal scalp hematoma. Electronically Signed   By: Ulyses Jarred M.D.   On: 09/30/2022 01:55   ECHOCARDIOGRAM LIMITED  Result Date: 09/29/2022    ECHOCARDIOGRAM LIMITED REPORT   Patient Name:   Nathaniel [name] Date of Exam: 09/29/2022 Medical Rec #:  [id number]    Height:       70.0 in Accession #:    [id number]   Weight:       162.0 lb Date of Birth:  [birth year]    BSA:          1.909 m Patient Age:    63 years     BP:           149/106 mmHg Patient Gender: M            HR:           109 bpm. Exam Location:  Inpatient Procedure: Limited Echo and Intracardiac Opacification Agent Indications:    Congestive heart failure  History:        Patient has prior history of Echocardiogram examinations, most                 recent 09/27/2022. CHF, AMS and PAD; Risk Factors:Hypertension                 and Former Smoker.  Sonographer:    Greer Pickerel Referring Phys: [id number] [place]  Sonographer Comments: Technically challenging study due to limited acoustic windows. Image acquisition challenging due to  uncooperative patient and Image acquisition challenging due to respiratory motion. Patient was confused during scan, kept grabbing my arm while stateing he was falling backwards. IMPRESSIONS  1. Limited echo with Definity, only the LV was imaged. Difficult images. LV EF appears to be 55-60% (normal) with no obvious wall motion abnormalities. FINDINGS  Left Ventricle: Limited echo with Definity, only the LV was imaged. Difficult images. LV EF appears to be 55-60% (normal) with no obvious wall motion abnormalities. Definity contrast agent was given IV to delineate the left ventricular endocardial borders. [employer] Electronically signed by Franki Monte Signature Date/Time: 09/29/2022/4:38:30 PM    Final      Marzetta Board, MD, PhD [facility]  Between 7 am - 7 pm I am available, please contact me via Amion (for emergencies) or Securechat (non urgent messages)  Between 7 pm - 7 am I am not available, please contact night coverage MD/APP via Amion

## 2022-09-30 NOTE — Progress Notes (Signed)
Cardiology Progress Note  Patient ID: JOHNTAY DOOLEN MRN: [id number] DOB: 09/16/1935 Date of Encounter: 09/30/2022  Primary Cardiologist: None  Subjective   Chief Complaint: Confused this morning.  HPI: Contrast echo confirms normal left ventricular function.  Brain MRI with subacute stroke.  Seems to be related to small vessel disease on my review.  ROS:  All other ROS reviewed and negative. Pertinent positives noted in the HPI.     Inpatient Medications  Scheduled Meds:  ascorbic acid  500 mg Oral Daily   atorvastatin  20 mg Oral Daily   brimonidine  1 drop Both Eyes Q12H   carvedilol  3.125 mg Oral BID WC   clopidogrel  75 mg Oral Daily   cyanocobalamin  1,000 mcg Oral Daily   dorzolamide  1 drop Both Eyes BID   ferrous sulfate  325 mg Oral Q breakfast   folic acid  1 mg Oral QHS   losartan  50 mg Oral Daily   predniSONE  5 mg Oral BID WC   sulfaSALAzine  1,000 mg Oral BID WC   tamsulosin  0.4 mg Oral QHS   Continuous Infusions:  PRN Meds: alum & mag hydroxide-simeth, HYDROcodone-acetaminophen, naLOXone (NARCAN)  injection   Vital Signs   Vitals:   09/29/22 0852 09/29/22 1525 09/29/22 1950 09/30/22 0426  BP: (!) 160/97 (!) 149/106 127/79 (!) 143/109  Pulse: (!) 109 100 96 (!) 106  Resp: '17 17 16 16  '$ Temp: 98.2 F (36.8 C) 99.2 F (37.3 C)  98.3 F (36.8 C)  TempSrc: Oral Oral  Oral  SpO2: 92% 98% 99% 99%  Weight:      Height:        Intake/Output Summary (Last 24 hours) at 09/30/2022 0940 Last data filed at 09/29/2022 1400 Gross per 24 hour  Intake 90 ml  Output --  Net 90 ml      09/26/2022    6:18 PM 04/24/2019    4:52 AM 04/23/2019    6:00 AM  Last 3 Weights  Weight (lbs) 162 lb 187 lb 9.8 oz 194 lb 7.1 oz  Weight (kg) 73.483 kg 85.1 kg 88.2 kg     ECG  The most recent ECG shows normal sinus rhythm heart rate 100, no acute ischemic changes, which I personally reviewed.   Physical Exam   Vitals:   09/29/22 0852 09/29/22 1525 09/29/22 1950  09/30/22 0426  BP: (!) 160/97 (!) 149/106 127/79 (!) 143/109  Pulse: (!) 109 100 96 (!) 106  Resp: '17 17 16 16  '$ Temp: 98.2 F (36.8 C) 99.2 F (37.3 C)  98.3 F (36.8 C)  TempSrc: Oral Oral  Oral  SpO2: 92% 98% 99% 99%  Weight:      Height:        Intake/Output Summary (Last 24 hours) at 09/30/2022 0940 Last data filed at 09/29/2022 1400 Gross per 24 hour  Intake 90 ml  Output --  Net 90 ml       09/26/2022    6:18 PM 04/24/2019    4:52 AM 04/23/2019    6:00 AM  Last 3 Weights  Weight (lbs) 162 lb 187 lb 9.8 oz 194 lb 7.1 oz  Weight (kg) 73.483 kg 85.1 kg 88.2 kg    Body mass index is 23.24 kg/m.  General: Well nourished, well developed, in no acute distress Head: Atraumatic, normal size  Eyes: PEERLA, EOMI  Neck: Supple, no JVD Endocrine: No thryomegaly Cardiac: Normal S1, S2; RRR; no murmurs, rubs, or  gallops Lungs: Clear to auscultation bilaterally, no wheezing, rhonchi or rales  Abd: Soft, nontender, no hepatomegaly  Ext: No edema, pulses 2+ Musculoskeletal: No deformities, BUE and BLE strength normal and equal Skin: Warm and dry, no rashes   Neuro: Awake, alert, oriented to self only  Labs  High Sensitivity Troponin:  No results for input(s): "TROPONINIHS" in the last 720 hours.   Cardiac EnzymesNo results for input(s): "TROPONINI" in the last 168 hours. No results for input(s): "TROPIPOC" in the last 168 hours.  Chemistry Recent Labs  Lab 09/26/22 1739 09/26/22 1749 09/27/22 0315 09/28/22 0331 09/30/22 0422  NA 143   < > 144 141 135  K 3.5   < > 3.4* 4.2 3.7  CL 109   < > 110 109 103  CO2 20*  --  '25 24 22  '$ GLUCOSE 103*   < > 112* 104* 124*  BUN 8   < > 6* 7* 13  CREATININE 1.10   < > 0.83 0.99 0.96  CALCIUM 9.4  --  9.2 8.8* 9.3  PROT 5.7*  --   --  5.0* 6.3*  ALBUMIN 3.9  --   --  3.2* 4.3  AST 24  --   --  19 34  ALT 14  --   --  14 20  ALKPHOS 71  --   --  56 52  BILITOT 0.4  --   --  0.7 1.5*  GFRNONAA >60  --  >60 >60 >60  ANIONGAP 14  --   '9 8 10   '$ < > = values in this interval not displayed.    Hematology Recent Labs  Lab 09/27/22 0315 09/28/22 0331 09/30/22 0422  WBC 8.1 7.0 15.2*  RBC 3.72* 3.59* 4.14*  HGB 12.7* 12.4* 14.1  HCT 39.2 38.5* 42.9  MCV 105.4* 107.2* 103.6*  MCH 34.1* 34.5* 34.1*  MCHC 32.4 32.2 32.9  RDW 14.5 14.8 14.1  PLT 143* 141* 178   BNPNo results for input(s): "BNP", "PROBNP" in the last 168 hours.  DDimer No results for input(s): "DDIMER" in the last 168 hours.   Radiology  DG CHEST PORT 1 VIEW  Result Date: 09/30/2022 CLINICAL DATA:  [id number] Leukocytosis 100030 EXAM: PORTABLE CHEST - 1 VIEW COMPARISON:  09/26/2022. FINDINGS: Unremarkable cardiac silhouette. Stable biapical pleural thickening. Lungs are otherwise clear. No pneumothorax or pleural effusion. There are thoracic degenerative changes. Note is made of gaseous distention of stomach. IMPRESSION: No acute cardiopulmonary disease. Electronically Signed   By: Sammie Bench M.D.   On: 09/30/2022 08:17   MR BRAIN WO CONTRAST  Result Date: 09/30/2022 CLINICAL DATA:  Altered mental status EXAM: MRI HEAD WITHOUT CONTRAST TECHNIQUE: Multiplanar, multiecho pulse sequences of the brain and surrounding structures were obtained without intravenous contrast. COMPARISON:  None Available. FINDINGS: Brain: There is a small acute/early subacute infarct of the left radiata. No acute or chronic hemorrhage. There is multifocal hyperintense T2-weighted signal within the white matter. There is advanced atrophy. There are multiple old cerebellar infarcts. The midline structures are normal. Vascular: Major flow voids are preserved. Skull and upper cervical spine: Right frontal scalp hematoma Sinuses/Orbits:No paranasal sinus fluid levels or advanced mucosal thickening. No mastoid or middle ear effusion. Normal orbits. IMPRESSION: 1. Small acute/early subacute infarct of the left radiata. No hemorrhage or mass effect. 2. Multiple old cerebellar infarcts and  findings of chronic small vessel ischemia. 3. Right frontal scalp hematoma. Electronically Signed   By: Ulyses Jarred M.D.   On:  09/30/2022 01:55   ECHOCARDIOGRAM LIMITED  Result Date: 09/29/2022    ECHOCARDIOGRAM LIMITED REPORT   Patient Name:   KEAHI MCCARNEY Date of Exam: 09/29/2022 Medical Rec #:  [id number]    Height:       70.0 in Accession #:    [id number]   Weight:       162.0 lb Date of Birth:  02/05/1935    BSA:          1.909 m Patient Age:    82 years     BP:           149/106 mmHg Patient Gender: M            HR:           109 bpm. Exam Location:  Inpatient Procedure: Limited Echo and Intracardiac Opacification Agent Indications:    Congestive heart failure  History:        Patient has prior history of Echocardiogram examinations, most                 recent 09/27/2022. CHF, AMS and PAD; Risk Factors:Hypertension                 and Former Smoker.  Sonographer:    Greer Pickerel Referring Phys: [id number] [place]  Sonographer Comments: Technically challenging study due to limited acoustic windows. Image acquisition challenging due to uncooperative patient and Image acquisition challenging due to respiratory motion. Patient was confused during scan, kept grabbing my arm while stateing he was falling backwards. IMPRESSIONS  1. Limited echo with Definity, only the LV was imaged. Difficult images. LV EF appears to be 55-60% (normal) with no obvious wall motion abnormalities. FINDINGS  Left Ventricle: Limited echo with Definity, only the LV was imaged. Difficult images. LV EF appears to be 55-60% (normal) with no obvious wall motion abnormalities. Definity contrast agent was given IV to delineate the left ventricular endocardial borders. Dalton McleanMD Electronically signed by Franki Monte Signature Date/Time: 09/29/2022/4:38:30 PM    Final     Cardiac Studies  TTE 09/29/2022  1. Limited echo with Definity, only the LV was imaged. Difficult images.  LV EF appears to be 55-60% (normal) with  no obvious wall motion  abnormalities.   Patient [place] is a 86 y.o. male with hypertension, PAD, systolic heart failure who was admitted on 09/26/2022 with mechanical fall and periprosthetic hip fracture.  This has been managed nonoperatively.  Course complicated by encephalopathy and found to have acute stroke.  Cardiology consulted due to prior history of congestive heart failure.  Assessment & Plan   #Chronic systolic heart failure with recovered ejection fraction, 55 to 60% -Contrast echo confirms that his LV function is normal.  We will just continue with good blood pressure control.  Can resume home medications.  #Lacunar stroke #Intracranial atherosclerosis -Confusion noted.  Found to have a left radiata stroke.  Seems to be lacunar.  Also with multiple old cerebellar infarcts and findings of chronic small vessel ischemia.  Seems to be related to intracranial atherosclerosis.  Further work-up per neurology.  #PAD -History of left anterior tibial artery occlusion.  No symptoms.  Has been managed on Plavix.  Statin was added yesterday.  [place] will sign off.   Medication Recommendations: Medical management as above Other recommendations (labs, testing, etc): None Follow up as an outpatient: He has been stable.  His ejection fraction is recovered.  He can follow-up with his primary  care physician.    For questions or updates, please contact [place] Please consult [email or web address] for contact info under   Signed, [place] T. Audie Box, MD, [place]  09/30/2022 9:40 AM

## 2022-10-01 DIAGNOSIS — D7589 Other specified diseases of blood and blood-forming organs: Secondary | ICD-10-CM | POA: Diagnosis not present

## 2022-10-01 DIAGNOSIS — R3129 Other microscopic hematuria: Secondary | ICD-10-CM | POA: Diagnosis not present

## 2022-10-01 DIAGNOSIS — G894 Chronic pain syndrome: Secondary | ICD-10-CM | POA: Diagnosis not present

## 2022-10-01 DIAGNOSIS — K219 Gastro-esophageal reflux disease without esophagitis: Secondary | ICD-10-CM | POA: Diagnosis not present

## 2022-10-01 DIAGNOSIS — I63312 Cerebral infarction due to thrombosis of left middle cerebral artery: Secondary | ICD-10-CM

## 2022-10-01 DIAGNOSIS — M6281 Muscle weakness (generalized): Secondary | ICD-10-CM | POA: Diagnosis not present

## 2022-10-01 DIAGNOSIS — Z7982 Long term (current) use of aspirin: Secondary | ICD-10-CM | POA: Diagnosis not present

## 2022-10-01 DIAGNOSIS — M6250 Muscle wasting and atrophy, not elsewhere classified, unspecified site: Secondary | ICD-10-CM | POA: Diagnosis not present

## 2022-10-01 DIAGNOSIS — R911 Solitary pulmonary nodule: Secondary | ICD-10-CM | POA: Diagnosis not present

## 2022-10-01 DIAGNOSIS — D6869 Other thrombophilia: Secondary | ICD-10-CM | POA: Diagnosis not present

## 2022-10-01 DIAGNOSIS — M069 Rheumatoid arthritis, unspecified: Secondary | ICD-10-CM | POA: Diagnosis not present

## 2022-10-01 DIAGNOSIS — Z743 Need for continuous supervision: Secondary | ICD-10-CM | POA: Diagnosis not present

## 2022-10-01 DIAGNOSIS — M978XXD Periprosthetic fracture around other internal prosthetic joint, subsequent encounter: Secondary | ICD-10-CM | POA: Diagnosis not present

## 2022-10-01 DIAGNOSIS — M9701XD Periprosthetic fracture around internal prosthetic right hip joint, subsequent encounter: Secondary | ICD-10-CM | POA: Diagnosis not present

## 2022-10-01 DIAGNOSIS — Z23 Encounter for immunization: Secondary | ICD-10-CM | POA: Diagnosis not present

## 2022-10-01 DIAGNOSIS — W1830XA Fall on same level, unspecified, initial encounter: Secondary | ICD-10-CM | POA: Diagnosis not present

## 2022-10-01 DIAGNOSIS — R2689 Other abnormalities of gait and mobility: Secondary | ICD-10-CM | POA: Diagnosis not present

## 2022-10-01 DIAGNOSIS — R410 Disorientation, unspecified: Secondary | ICD-10-CM | POA: Diagnosis not present

## 2022-10-01 DIAGNOSIS — R58 Hemorrhage, not elsewhere classified: Secondary | ICD-10-CM | POA: Diagnosis not present

## 2022-10-01 DIAGNOSIS — I5040 Unspecified combined systolic (congestive) and diastolic (congestive) heart failure: Secondary | ICD-10-CM | POA: Diagnosis not present

## 2022-10-01 DIAGNOSIS — Z8673 Personal history of transient ischemic attack (TIA), and cerebral infarction without residual deficits: Secondary | ICD-10-CM | POA: Diagnosis not present

## 2022-10-01 DIAGNOSIS — R279 Unspecified lack of coordination: Secondary | ICD-10-CM | POA: Diagnosis not present

## 2022-10-01 DIAGNOSIS — I499 Cardiac arrhythmia, unspecified: Secondary | ICD-10-CM | POA: Diagnosis not present

## 2022-10-01 DIAGNOSIS — R52 Pain, unspecified: Secondary | ICD-10-CM | POA: Diagnosis not present

## 2022-10-01 DIAGNOSIS — Z7902 Long term (current) use of antithrombotics/antiplatelets: Secondary | ICD-10-CM | POA: Diagnosis not present

## 2022-10-01 DIAGNOSIS — I639 Cerebral infarction, unspecified: Secondary | ICD-10-CM

## 2022-10-01 DIAGNOSIS — D849 Immunodeficiency, unspecified: Secondary | ICD-10-CM | POA: Diagnosis not present

## 2022-10-01 DIAGNOSIS — R5381 Other malaise: Secondary | ICD-10-CM | POA: Diagnosis not present

## 2022-10-01 DIAGNOSIS — Z7189 Other specified counseling: Secondary | ICD-10-CM | POA: Diagnosis not present

## 2022-10-01 DIAGNOSIS — I5021 Acute systolic (congestive) heart failure: Secondary | ICD-10-CM | POA: Diagnosis not present

## 2022-10-01 DIAGNOSIS — I110 Hypertensive heart disease with heart failure: Secondary | ICD-10-CM | POA: Diagnosis not present

## 2022-10-01 DIAGNOSIS — M9701XA Periprosthetic fracture around internal prosthetic right hip joint, initial encounter: Secondary | ICD-10-CM | POA: Diagnosis not present

## 2022-10-01 DIAGNOSIS — Y93H2 Activity, gardening and landscaping: Secondary | ICD-10-CM | POA: Diagnosis not present

## 2022-10-01 DIAGNOSIS — Z9181 History of falling: Secondary | ICD-10-CM | POA: Diagnosis not present

## 2022-10-01 DIAGNOSIS — R1311 Dysphagia, oral phase: Secondary | ICD-10-CM | POA: Diagnosis not present

## 2022-10-01 DIAGNOSIS — G9341 Metabolic encephalopathy: Secondary | ICD-10-CM | POA: Diagnosis not present

## 2022-10-01 DIAGNOSIS — Z96649 Presence of unspecified artificial hip joint: Secondary | ICD-10-CM | POA: Diagnosis not present

## 2022-10-01 DIAGNOSIS — L308 Other specified dermatitis: Secondary | ICD-10-CM | POA: Diagnosis not present

## 2022-10-01 DIAGNOSIS — S7002XA Contusion of left hip, initial encounter: Secondary | ICD-10-CM | POA: Diagnosis not present

## 2022-10-01 DIAGNOSIS — I739 Peripheral vascular disease, unspecified: Secondary | ICD-10-CM | POA: Diagnosis not present

## 2022-10-01 DIAGNOSIS — I6789 Other cerebrovascular disease: Secondary | ICD-10-CM | POA: Diagnosis not present

## 2022-10-01 DIAGNOSIS — S0083XA Contusion of other part of head, initial encounter: Secondary | ICD-10-CM | POA: Diagnosis not present

## 2022-10-01 LAB — LIPID PANEL
Cholesterol: 184 mg/dL (ref 0–200)
HDL: 55 mg/dL (ref >40)
LDL Cholesterol: 113 mg/dL — ABNORMAL HIGH (ref 0–99)
Total CHOL/HDL Ratio: 3.3 RATIO
Triglycerides: 81 mg/dL (ref <150)
VLDL: 16 mg/dL (ref 0–40)

## 2022-10-01 MED ORDER — PANTOPRAZOLE SODIUM 40 MG PO TBEC
40.0000 mg | DELAYED_RELEASE_TABLET | Freq: Every day | ORAL | Status: DC
  Administered 2022-10-01: 40 mg via ORAL
  Filled 2022-10-01: qty 1

## 2022-10-01 MED ORDER — CARVEDILOL 3.125 MG PO TABS: 3.1250 mg | ORAL_TABLET | Freq: Two times a day (BID) | ORAL | Status: AC

## 2022-10-01 MED ORDER — ATORVASTATIN CALCIUM 20 MG PO TABS: 20.0000 mg | ORAL_TABLET | Freq: Every day | ORAL | Status: AC

## 2022-10-01 MED ORDER — LOSARTAN POTASSIUM 50 MG PO TABS: 50.0000 mg | ORAL_TABLET | Freq: Every day | ORAL | Status: AC

## 2022-10-01 MED ORDER — HYDROCODONE-ACETAMINOPHEN 5-325 MG PO TABS: 1.0000 | ORAL_TABLET | Freq: Four times a day (QID) | ORAL | 0 refills | Status: AC | PRN

## 2022-10-01 MED ORDER — PANTOPRAZOLE SODIUM 40 MG PO TBEC: 40.0000 mg | DELAYED_RELEASE_TABLET | Freq: Every day | ORAL | Status: AC

## 2022-10-01 MED ORDER — CALCIUM CARBONATE ANTACID 500 MG PO CHEW
1.0000 | CHEWABLE_TABLET | Freq: Once | ORAL | Status: AC
  Administered 2022-10-01: 200 mg via ORAL
  Filled 2022-10-01: qty 1

## 2022-10-01 MED ORDER — ASPIRIN 81 MG PO CHEW: 81.0000 mg | CHEWABLE_TABLET | Freq: Every day | ORAL | 0 refills | Status: AC

## 2022-10-01 MED ORDER — ASPIRIN 81 MG PO CHEW
81.0000 mg | CHEWABLE_TABLET | Freq: Every day | ORAL | Status: DC
  Administered 2022-10-01: 81 mg via ORAL
  Filled 2022-10-01: qty 1

## 2022-10-01 NOTE — Progress Notes (Addendum)
STROKE TEAM PROGRESS NOTE   SUBJECTIVE (INTERVAL HISTORY) His RN is at the bedside.  Overall his condition is stable. Pt still confused, not orientated to time or age, still has right leg weakness but was told that is due to the right hip injury in the past.    OBJECTIVE Temp:  [98 F (36.7 C)-98.3 F (36.8 C)] 98 F (36.7 C) (11/29 0821) Pulse Rate:  [92-100] 100 (11/29 0821) Resp:  [17-18] 18 (11/29 0821) BP: (128-152)/(73-83) 152/83 (11/29 0821) SpO2:  [96 %-97 %] 97 % (11/29 0821)  No results for input(s): "GLUCAP" in the last 168 hours. Recent Labs  Lab 09/26/22 1739 09/26/22 1749 09/27/22 0315 09/28/22 0331 09/30/22 0422  NA 143 141 144 141 135  K 3.5 3.3* 3.4* 4.2 3.7  CL 109 108 110 109 103  CO2 20*  --  '25 24 22  '$ GLUCOSE 103* 102* 112* 104* 124*  BUN 8 7* 6* 7* 13  CREATININE 1.10 1.00 0.83 0.99 0.96  CALCIUM 9.4  --  9.2 8.8* 9.3  MG  --   --  1.8 1.8 1.9   Recent Labs  Lab 09/26/22 1739 09/28/22 0331 09/30/22 0422  AST 24 19 34  ALT '14 14 20  '$ ALKPHOS 71 56 52  BILITOT 0.4 0.7 1.5*  PROT 5.7* 5.0* 6.3*  ALBUMIN 3.9 3.2* 4.3   Recent Labs  Lab 09/26/22 1739 09/26/22 1749 09/27/22 0315 09/28/22 0331 09/30/22 0422  WBC 8.9  --  8.1 7.0 15.2*  HGB 13.6 13.6 12.7* 12.4* 14.1  HCT 43.9 40.0 39.2 38.5* 42.9  MCV 109.2*  --  105.4* 107.2* 103.6*  PLT 151  --  143* 141* 178   No results for input(s): "CKTOTAL", "CKMB", "CKMBINDEX", "TROPONINI" in the last 168 hours. No results for input(s): "LABPROT", "INR" in the last 72 hours. Recent Labs    09/30/22 1904  COLORURINE AMBER*  LABSPEC 1.021  PHURINE 5.0  GLUCOSEU NEGATIVE  HGBUR NEGATIVE  BILIRUBINUR NEGATIVE  KETONESUR 5*  PROTEINUR 30*  NITRITE NEGATIVE  LEUKOCYTESUR NEGATIVE       Component Value Date/Time   CHOL 184 10/01/2022 0543   TRIG 81 10/01/2022 0543   HDL 55 10/01/2022 0543   CHOLHDL 3.3 10/01/2022 0543   VLDL 16 10/01/2022 0543   LDLCALC 113 (H) 10/01/2022 0543   Lab  Results  Component Value Date   HGBA1C 6.3 (H) 04/20/2015      Component Value Date/Time   LABOPIA POSITIVE (A) 04/17/2018 1202   COCAINSCRNUR NONE DETECTED 04/17/2018 1202   LABBENZ NONE DETECTED 04/17/2018 1202   AMPHETMU NONE DETECTED 04/17/2018 1202   THCU NONE DETECTED 04/17/2018 1202   LABBARB (A) 04/17/2018 1202    Result not available. Reagent lot number recalled by manufacturer.    Recent Labs  Lab 09/26/22 [place] <10    I have personally reviewed the radiological images below and agree with the radiology interpretations.  CT ANGIO HEAD NECK W WO CM  Result Date: 09/30/2022 CLINICAL DATA:  Stroke//TIA EXAM: CT ANGIOGRAPHY HEAD AND NECK TECHNIQUE: Multidetector CT imaging of the head and neck was performed using the standard protocol during bolus administration of intravenous contrast. Multiplanar CT image reconstructions and MIPs were obtained to evaluate the vascular anatomy. Carotid stenosis measurements (when applicable) are obtained utilizing NASCET criteria, using the distal internal carotid diameter as the denominator. RADIATION DOSE REDUCTION: This exam was performed according to the departmental dose-optimization program which includes automated exposure control, adjustment of the  mA and/or kV according to patient size and/or use of iterative reconstruction technique. CONTRAST:  70m OMNIPAQUE IOHEXOL 350 MG/ML SOLN COMPARISON:  Brain MRI 09/29/2022 FINDINGS: CT HEAD FINDINGS Brain: There is no mass, hemorrhage or extra-axial collection. The size and configuration of the ventricles and extra-axial CSF spaces are normal. There is no acute or chronic infarction. The brain parenchyma is normal. Skull: Right periorbital hematoma.  No skull fracture. Sinuses/Orbits: No fluid levels or advanced mucosal thickening of the visualized paranasal sinuses. No mastoid or middle ear effusion. The orbits are normal. CTA NECK FINDINGS SKELETON: There is no bony spinal canal stenosis. No  lytic or blastic lesion. OTHER NECK: Normal pharynx, larynx and major salivary glands. No cervical lymphadenopathy. Unremarkable thyroid gland. UPPER CHEST: Biapical scarring AORTIC ARCH: There is calcific atherosclerosis of the aortic arch. There is no aneurysm, dissection or hemodynamically significant stenosis of the visualized portion of the aorta. Conventional 3 vessel aortic branching pattern. The visualized proximal subclavian arteries are widely patent. RIGHT CAROTID SYSTEM: Normal without aneurysm, dissection or stenosis. LEFT CAROTID SYSTEM: Normal without aneurysm, dissection or stenosis. VERTEBRAL ARTERIES: Left dominant configuration. Both origins are clearly patent. There is no dissection, occlusion or flow-limiting stenosis to the skull base (V1-V3 segments). CTA HEAD FINDINGS POSTERIOR CIRCULATION: --Vertebral arteries: Normal V4 segments. --Inferior cerebellar arteries: Normal. --Basilar artery: Normal. --Superior cerebellar arteries: Normal. --Posterior cerebral arteries (PCA): Normal. ANTERIOR CIRCULATION: --Intracranial internal carotid arteries: Normal. --Anterior cerebral arteries (ACA): Normal. Both A1 segments are present. Patent anterior communicating artery (a-comm). --Middle cerebral arteries (MCA): Normal. VENOUS SINUSES: As permitted by contrast timing, patent. ANATOMIC VARIANTS: None Review of the MIP images confirms the above findings. IMPRESSION: 1. No emergent large vessel occlusion or high-grade stenosis of the intracranial arteries. 2. Right periorbital hematoma. No skull fracture. Aortic atherosclerosis (ICD10-I70.0). Electronically Signed   By: KUlyses JarredM.D.   On: 09/30/2022 21:33   DG CHEST PORT 1 VIEW  Result Date: 09/30/2022 CLINICAL DATA:  [id number] EXAM: PORTABLE CHEST - 1 VIEW COMPARISON:  09/26/2022. FINDINGS: Unremarkable cardiac silhouette. Stable biapical pleural thickening. Lungs are otherwise clear. No pneumothorax or pleural effusion. There  are thoracic degenerative changes. Note is made of gaseous distention of stomach. IMPRESSION: No acute cardiopulmonary disease. Electronically Signed   By: JSammie BenchM.D.   On: 09/30/2022 08:17   MR BRAIN WO CONTRAST  Result Date: 09/30/2022 CLINICAL DATA:  Altered mental status EXAM: MRI HEAD WITHOUT CONTRAST TECHNIQUE: Multiplanar, multiecho pulse sequences of the brain and surrounding structures were obtained without intravenous contrast. COMPARISON:  None Available. FINDINGS: Brain: There is a small acute/early subacute infarct of the left radiata. No acute or chronic hemorrhage. There is multifocal hyperintense T2-weighted signal within the white matter. There is advanced atrophy. There are multiple old cerebellar infarcts. The midline structures are normal. Vascular: Major flow voids are preserved. Skull and upper cervical spine: Right frontal scalp hematoma Sinuses/Orbits:No paranasal sinus fluid levels or advanced mucosal thickening. No mastoid or middle ear effusion. Normal orbits. IMPRESSION: 1. Small acute/early subacute infarct of the left radiata. No hemorrhage or mass effect. 2. Multiple old cerebellar infarcts and findings of chronic small vessel ischemia. 3. Right frontal scalp hematoma. Electronically Signed   By: KUlyses JarredM.D.   On: 09/30/2022 01:55   ECHOCARDIOGRAM LIMITED  Result Date: 09/29/2022    ECHOCARDIOGRAM LIMITED REPORT   Patient Name:   Nathaniel SITZERDate of Exam: 09/29/2022 Medical Rec #:  [id number]   Height:  70.0 in Accession #:    [id number]   Weight:       162.0 lb Date of Birth:  [birth year]    BSA:          1.909 m Patient Age:    87 years     BP:           149/106 mmHg Patient Gender: M            HR:           109 bpm. Exam Location:  Inpatient Procedure: Limited Echo and Intracardiac Opacification Agent Indications:    Congestive heart failure  History:        Patient has prior history of Echocardiogram examinations, most                 recent  09/27/2022. CHF, AMS and PAD; Risk Factors:Hypertension                 and Former Smoker.  Sonographer:    Greer Pickerel Referring Phys: [id number] [place]  Sonographer Comments: Technically challenging study due to limited acoustic windows. Image acquisition challenging due to uncooperative patient and Image acquisition challenging due to respiratory motion. Patient was confused during scan, kept grabbing my arm while stateing he was falling backwards. IMPRESSIONS  1. Limited echo with Definity, only the LV was imaged. Difficult images. LV EF appears to be 55-60% (normal) with no obvious wall motion abnormalities. FINDINGS  Left Ventricle: Limited echo with Definity, only the LV was imaged. Difficult images. LV EF appears to be 55-60% (normal) with no obvious wall motion abnormalities. Definity contrast agent was given IV to delineate the left ventricular endocardial borders. [employer] Electronically signed by Franki Monte Signature Date/Time: 09/29/2022/4:38:30 PM    Final    ECHOCARDIOGRAM COMPLETE  Result Date: 09/27/2022    ECHOCARDIOGRAM REPORT   Patient Name:   Nathaniel [name] Date of Exam: 09/27/2022 Medical Rec #:  [id number]    Height:       70.0 in Accession #:    [id number]   Weight:       162.0 lb Date of Birth:  [birth year]    BSA:          1.909 m Patient Age:    7 years     BP:           128/75 mmHg Patient Gender: M            HR:           73 bpm. Exam Location:  Inpatient Procedure: 2D Echo, Cardiac Doppler and Color Doppler Indications:    CHF-Acute Systolic O27.74  History:        Patient has prior history of Echocardiogram examinations, most                 recent 04/20/2015. CHF, PAD; Risk Factors:Hypertension.  Sonographer:    Ronny Flurry Referring Phys: [id number] [place]  1. Images are limited.  2. Left ventricular ejection fraction, by estimation, is 25 to 30%. The left ventricle has severely decreased function. Left ventricular endocardial border  not optimally defined to evaluate regional wall motion. There is mild left ventricular hypertrophy. Left ventricular diastolic parameters are consistent with Grade I diastolic dysfunction (impaired relaxation).  3. Right ventricular systolic function is mildly reduced. The right ventricular size is normal. Tricuspid regurgitation signal is inadequate for assessing PA pressure.  4. The mitral valve is degenerative. Trivial mitral valve regurgitation.  5. The aortic valve is tricuspid. Aortic valve regurgitation is not visualized. Aortic valve sclerosis is present, with no evidence of aortic valve stenosis. Aortic valve mean gradient measures 2.0 mmHg.  6. Unable to estimate CVP. Comparison(s): Prior images unable to be directly viewed. FINDINGS  Left Ventricle: Left ventricular ejection fraction, by estimation, is 25 to 30%. The left ventricle has severely decreased function. Left ventricular endocardial border not optimally defined to evaluate regional wall motion. The left ventricular internal cavity size was normal in size. There is mild left ventricular hypertrophy. Left ventricular diastolic parameters are consistent with Grade I diastolic dysfunction (impaired relaxation). Right Ventricle: The right ventricular size is normal. No increase in right ventricular wall thickness. Right ventricular systolic function is mildly reduced. Tricuspid regurgitation signal is inadequate for assessing PA pressure. Left Atrium: Left atrial size was normal in size. Right Atrium: Right atrial size was normal in size. Pericardium: There is no evidence of pericardial effusion. Mitral Valve: The mitral valve is degenerative in appearance. There is mild calcification of the mitral valve leaflet(s). Mild mitral annular calcification. Trivial mitral valve regurgitation. Tricuspid Valve: The tricuspid valve is grossly normal. Tricuspid valve regurgitation is trivial. Aortic Valve: The aortic valve is tricuspid. There is mild aortic  valve annular calcification. Aortic valve regurgitation is not visualized. Aortic valve sclerosis is present, with no evidence of aortic valve stenosis. Aortic valve mean gradient measures  2.0 mmHg. Aortic valve peak gradient measures 4.8 mmHg. Aortic valve area, by VTI measures 2.52 cm. Pulmonic Valve: The pulmonic valve was not well visualized. Pulmonic valve regurgitation is trivial. Aorta: The aortic root is normal in size and structure. Venous: Unable to estimate CVP. The inferior vena cava was not well visualized. IAS/Shunts: No atrial level shunt detected by color flow Doppler.  LEFT VENTRICLE PLAX 2D LVOT diam:     2.00 cm   Diastology LV SV:         58        LV e' medial:    3.59 cm/s LV SV Index:   30        LV E/e' medial:  17.0 LVOT Area:     3.14 cm  LV e' lateral:   6.96 cm/s                          LV E/e' lateral: 8.8  RIGHT VENTRICLE RV S prime:     11.70 cm/s TAPSE (M-mode): 1.9 cm LEFT ATRIUM             Index        RIGHT ATRIUM           Index LA Vol (A2C):   36.1 ml 18.92 ml/m  RA Area:     14.80 cm LA Vol (A4C):   28.5 ml 14.93 ml/m  RA Volume:   35.60 ml  18.65 ml/m LA Biplane Vol: 31.8 ml 16.66 ml/m  AORTIC VALVE AV Area (Vmax):    2.57 cm AV Area (Vmean):   2.53 cm AV Area (VTI):     2.52 cm AV Vmax:           109.00 cm/s AV Vmean:          71.900 cm/s AV VTI:            0.229 m AV Peak Grad:      4.8 mmHg AV Mean Grad:      2.0 mmHg LVOT Vmax:  89.00 cm/s LVOT Vmean:        58.000 cm/s LVOT VTI:          0.184 m LVOT/AV VTI ratio: 0.80  AORTA Ao Root diam: 3.60 cm MITRAL VALVE MV Area (PHT): 2.73 cm     SHUNTS MV Decel Time: 278 msec     Systemic VTI:  0.18 m MV E velocity: 61.10 cm/s   Systemic Diam: 2.00 cm MV A velocity: 101.00 cm/s MV E/A ratio:  0.60 Rozann Lesches MD Electronically signed by Rozann Lesches MD Signature Date/Time: 09/27/2022/2:01:07 PM    Final    CT HEAD WO CONTRAST  Result Date: 09/26/2022 CLINICAL DATA:  Fall EXAM: CT HEAD WITHOUT  CONTRAST CT MAXILLOFACIAL WITHOUT CONTRAST CT CERVICAL SPINE WITHOUT CONTRAST TECHNIQUE: Multidetector CT imaging of the head, cervical spine, and maxillofacial structures were performed using the standard protocol without intravenous contrast. Multiplanar CT image reconstructions of the cervical spine and maxillofacial structures were also generated. RADIATION DOSE REDUCTION: This exam was performed according to the departmental dose-optimization program which includes automated exposure control, adjustment of the mA and/or kV according to patient size and/or use of iterative reconstruction technique. COMPARISON:  CT head 02/14/2022, MR head 04/17/2018 FINDINGS: CT HEAD FINDINGS Brain: There is no acute intracranial hemorrhage, extra-axial fluid collection, or acute infarct. Parenchymal volume is normal for age. The ventricles are normal in size. Gray-white differentiation is preserved. 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 An additional small focus of hypodensity in the centrum semiovale on the left more posteriorly may reflect background chronic small-vessel ischemic change or interval infarct, though still remote in appearance. There is no mass lesion.  There is no mass effect or midline shift. Vascular: There is calcification of the bilateral carotid siphons. Skull: Normal. Negative for fracture or focal lesion. Other: None. CT MAXILLOFACIAL FINDINGS Osseous: There is no acute facial bone fracture. There is no evidence of mandibular dislocation. There is no suspicious osseous lesion. Orbits: There is significant right periorbital soft tissue swelling with a right frontal scalp hematoma. There is no retrobulbar hematoma. The globe is intact. 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 There is a punctate metallic density foreign body in the left globe, present in 2016. Note that at least 2 brain  MRIs were obtained after the CT in 2016 which demonstrated this foreign body. Sinuses: Clear. Soft tissues: Right supraorbital soft tissue swelling and frontal scalp swelling/hematoma as above. CT CERVICAL SPINE FINDINGS Alignment: There is trace anterolisthesis of C4 on C5, C5 on C6, and C7 on T1. There is no jumped or perched facet or other evidence of traumatic malalignment. Skull base and vertebrae: Skull base alignment is maintained. There is no evidence of acute fracture. There is no suspicious osseous lesion. Soft tissues and spinal canal: No prevertebral fluid or swelling. No visible canal hematoma. Disc levels: There is disc space narrowing and degenerative endplate change most advanced at C2-C3 and C6-C7. Facet arthropathy is most advanced on the left at C3-C4. There is no evidence of high-grade spinal canal stenosis. Upper chest: There is fibrotic change with architectural distortion/scarring in both lung apices. There is a 6 mm nodule in the right lung apex. Other: None. IMPRESSION: 1. No acute intracranial pathology. 2. Right supraorbital and forehead swelling/hematoma without underlying calvarial or facial bone fracture. 3. No acute fracture or traumatic malalignment of the cervical spine. 4. 6 mm nodule in  the right lung apex. In the absence of prior studies for comparison, non-contrast chest CT at 6-12 months is recommended, depending on patient comorbidities given age. Electronically Signed   By: Valetta Mole M.D.   On: 09/26/2022 18:50   CT MAXILLOFACIAL WO CONTRAST  Result Date: 09/26/2022 CLINICAL DATA:  Fall EXAM: CT HEAD WITHOUT CONTRAST CT MAXILLOFACIAL WITHOUT CONTRAST CT CERVICAL SPINE WITHOUT CONTRAST TECHNIQUE: Multidetector CT imaging of the head, cervical spine, and maxillofacial structures were performed using the standard protocol without intravenous contrast. Multiplanar CT image reconstructions of the cervical spine and maxillofacial structures were also generated. RADIATION  DOSE REDUCTION: This exam was performed according to the departmental dose-optimization program which includes automated exposure control, adjustment of the mA and/or kV according to patient size and/or use of iterative reconstruction technique. COMPARISON:  CT head 02/14/2022, MR head 04/17/2018 FINDINGS: CT HEAD FINDINGS Brain: There is no acute intracranial hemorrhage, extra-axial fluid collection, or acute infarct. Parenchymal volume is normal for age. The ventricles are normal in size. Gray-white differentiation is preserved. 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 An additional small focus of hypodensity in the centrum semiovale on the left more posteriorly may reflect background chronic small-vessel ischemic change or interval infarct, though still remote in appearance. There is no mass lesion.  There is no mass effect or midline shift. Vascular: There is calcification of the bilateral carotid siphons. Skull: Normal. Negative for fracture or focal lesion. Other: None. CT MAXILLOFACIAL FINDINGS Osseous: There is no acute facial bone fracture. There is no evidence of mandibular dislocation. There is no suspicious osseous lesion. Orbits: There is significant right periorbital soft tissue swelling with a right frontal scalp hematoma. There is no retrobulbar hematoma. The globe is intact. 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 There is a punctate metallic density foreign body in the left globe, present in 2016. Note that at least 2 brain MRIs were obtained after the CT in 2016 which demonstrated this foreign body. Sinuses: Clear. Soft tissues: Right supraorbital soft tissue swelling and frontal scalp swelling/hematoma as above. CT CERVICAL SPINE FINDINGS Alignment: There is trace anterolisthesis of C4 on C5, C5 on C6, and C7 on T1. There is no jumped or perched facet or other evidence of traumatic  malalignment. Skull base and vertebrae: Skull base alignment is maintained. There is no evidence of acute fracture. There is no suspicious osseous lesion. Soft tissues and spinal canal: No prevertebral fluid or swelling. No visible canal hematoma. Disc levels: There is disc space narrowing and degenerative endplate change most advanced at C2-C3 and C6-C7. Facet arthropathy is most advanced on the left at C3-C4. There is no evidence of high-grade spinal canal stenosis. Upper chest: There is fibrotic change with architectural distortion/scarring in both lung apices. There is a 6 mm nodule in the right lung apex. Other: None. IMPRESSION: 1. No acute intracranial pathology. 2. Right supraorbital and forehead swelling/hematoma without underlying calvarial or facial bone fracture. 3. No acute fracture or traumatic malalignment of the cervical spine. 4. 6 mm nodule in the right lung apex. In the absence of prior studies for comparison, non-contrast chest CT at 6-12 months is recommended, depending on patient comorbidities given age. Electronically Signed   By: Valetta Mole M.D.   On: 09/26/2022 18:50   CT CERVICAL SPINE WO CONTRAST  Result Date: 09/26/2022 CLINICAL DATA:  Fall EXAM: CT HEAD WITHOUT CONTRAST CT MAXILLOFACIAL  WITHOUT CONTRAST CT CERVICAL SPINE WITHOUT CONTRAST TECHNIQUE: Multidetector CT imaging of the head, cervical spine, and maxillofacial structures were performed using the standard protocol without intravenous contrast. Multiplanar CT image reconstructions of the cervical spine and maxillofacial structures were also generated. RADIATION DOSE REDUCTION: This exam was performed according to the departmental dose-optimization program which includes automated exposure control, adjustment of the mA and/or kV according to patient size and/or use of iterative reconstruction technique. COMPARISON:  CT head 02/14/2022, MR head 04/17/2018 FINDINGS: CT HEAD FINDINGS Brain: There is no acute intracranial  hemorrhage, extra-axial fluid collection, or acute infarct. Parenchymal volume is normal for age. The ventricles are normal in size. Gray-white differentiation is preserved. 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 An additional small focus of hypodensity in the centrum semiovale on the left more posteriorly may reflect background chronic small-vessel ischemic change or interval infarct, though still remote in appearance. There is no mass lesion.  There is no mass effect or midline shift. Vascular: There is calcification of the bilateral carotid siphons. Skull: Normal. Negative for fracture or focal lesion. Other: None. CT MAXILLOFACIAL FINDINGS Osseous: There is no acute facial bone fracture. There is no evidence of mandibular dislocation. There is no suspicious osseous lesion. Orbits: There is significant right periorbital soft tissue swelling with a right frontal scalp hematoma. There is no retrobulbar hematoma. The globe is intact. 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 There is a punctate metallic density foreign body in the left globe, present in 2016. Note that at least 2 brain MRIs were obtained after the CT in 2016 which demonstrated this foreign body. Sinuses: Clear. Soft tissues: Right supraorbital soft tissue swelling and frontal scalp swelling/hematoma as above. CT CERVICAL SPINE FINDINGS Alignment: There is trace anterolisthesis of C4 on C5, C5 on C6, and C7 on T1. There is no jumped or perched facet or other evidence of traumatic malalignment. Skull base and vertebrae: Skull base alignment is maintained. There is no evidence of acute fracture. There is no suspicious osseous lesion. Soft tissues and spinal canal: No prevertebral fluid or swelling. No visible canal hematoma. Disc levels: There is disc space narrowing and degenerative endplate change most advanced at C2-C3 and C6-C7. Facet  arthropathy is most advanced on the left at C3-C4. There is no evidence of high-grade spinal canal stenosis. Upper chest: There is fibrotic change with architectural distortion/scarring in both lung apices. There is a 6 mm nodule in the right lung apex. Other: None. IMPRESSION: 1. No acute intracranial pathology. 2. Right supraorbital and forehead swelling/hematoma without underlying calvarial or facial bone fracture. 3. No acute fracture or traumatic malalignment of the cervical spine. 4. 6 mm nodule in the right lung apex. In the absence of prior studies for comparison, non-contrast chest CT at 6-12 months is recommended, depending on patient comorbidities given age. Electronically Signed   By: Valetta Mole M.D.   On: 09/26/2022 18:50   CT PELVIS WO CONTRAST  Result Date: 09/26/2022 CLINICAL DATA:  Fall hip fracture suspected EXAM: CT PELVIS WITHOUT CONTRAST TECHNIQUE: Multidetector CT imaging of the pelvis was performed following the standard protocol without intravenous contrast. RADIATION DOSE REDUCTION: This exam was performed according to the departmental dose-optimization program which includes automated exposure control, adjustment of the mA and/or kV according to patient size and/or use of iterative reconstruction technique. COMPARISON:  Same day radiographs FINDINGS: Urinary Tract:  No abnormality  visualized. Bowel:  Unremarkable visualized pelvic bowel loops. Vascular/Lymphatic: No pathologically enlarged lymph nodes. Aortic atherosclerosis. Reproductive:  No mass or other significant abnormality Other:  None. Musculoskeletal: Status post right hip total arthroplasty. Minimally displaced perihardware fracture about the posterior aspect of the intratrochanteric right femur and right femoral arthroplasty component (series 3, image 114, 129). Overlying soft tissue contusion and hematoma. The acetabular component and bony acetabulum appear intact. No other displaced fracture or dislocation of the  proximal left femur or bony pelvis. Subacute appearing, partially callused fracture of the left L3 spinous process, partially included (series 3, image 1). Posterior osteopenia. IMPRESSION: 1. Status post right hip total arthroplasty. 2. Minimally displaced perihardware fracture about the posterior aspect of the intratrochanteric right femur and right femoral arthroplasty component. 3. Overlying soft tissue contusion and hematoma. 4. No other displaced fracture or dislocation of the proximal left femur or bony pelvis. 5. Subacute appearing, partially callused fracture of the left L3 spinous process, partially imaged. 6. Osteopenia, which significantly limits sensitivity for nondisplaced fracture. Aortic Atherosclerosis (ICD10-I70.0). Electronically Signed   By: Delanna Ahmadi M.D.   On: 09/26/2022 18:30   DG Pelvis Portable  Result Date: 09/26/2022 CLINICAL DATA:  [place]. EXAM: PORTABLE PELVIS 1-2 VIEWS COMPARISON:  Radiographs 02/14/2022 FINDINGS: The right hip components are intact. No dislocation. Suspect a small nondisplaced fracture involving the lesser trochanter. The bony pelvis and left hip are intact. IMPRESSION: Suspect small nondisplaced fracture involving the right lesser trochanter. Electronically Signed   By: Marijo Sanes M.D.   On: 09/26/2022 18:07   DG Femur Min 2 Views Right  Result Date: 09/26/2022 CLINICAL DATA:  Fall, pain EXAM: RIGHT FEMUR 2 VIEWS COMPARISON:  None Available. FINDINGS: Status post right hip total arthroplasty and right knee total arthroplasty. Acute, minimally displaced perihardware fracture of the intratrochanteric right femur about the femoral stem component, best appreciated on cross-table lateral view. No perihardware fracture about the knee. Soft tissues unremarkable. IMPRESSION: 1. Status post right hip total arthroplasty and right knee total arthroplasty. 2. Acute, minimally displaced perihardware fracture of the intratrochanteric right femur about the femoral  stem component. 3.  No perihardware fracture about the knee. Electronically Signed   By: Delanna Ahmadi M.D.   On: 09/26/2022 18:06   DG Chest Port 1 View  Result Date: 09/26/2022 CLINICAL DATA:  [place]. EXAM: PORTABLE CHEST 1 VIEW COMPARISON:  04/17/2018 FINDINGS: The cardiac silhouette, mediastinal and hilar contours are within normal limits and stable. No acute pulmonary findings. No pleural effusion or pneumothorax. The bony thorax is intact. IMPRESSION: No acute cardiopulmonary findings. Electronically Signed   By: Marijo Sanes M.D.   On: 09/26/2022 18:04     PHYSICAL EXAM  Temp:  [98 F (36.7 C)-98.3 F (36.8 C)] 98 F (36.7 C) (11/29 0821) Pulse Rate:  [92-100] 100 (11/29 0821) Resp:  [17-18] 18 (11/29 0821) BP: (128-152)/(73-83) 152/83 (11/29 0821) SpO2:  [96 %-97 %] 97 % (11/29 0821)  General - Well nourished, well developed, in no apparent distress.  Ophthalmologic - fundi not visualized due to noncooperation.  Cardiovascular - Regular rhythm and rate.  Neuro - awake, alert, eyes open, orientated to self and place only, not orientated to age or time. No aphasia but paucity of speech, able to repeat, but prominent perseverations, following most simple commands. Able to name 3/4 and repeat. No gaze palsy, tracking bilaterally, visual field full, PERRL. No facial droop. Tongue midline. Bilateral UEs 5/5, no drift. LLE 4/5 proximal and distal. However RLE barely  against gravity proximal due to hip and knee pain and 2-3/5 distal ankle DF/PF. Sensation symmetrical bilaterally subjectively, b/l FTN not cooperative but no gross ataxia, gait not tested.     ASSESSMENT/PLAN Nathaniel [name] is a 87 y.o. male with history of HTN, RA, left eye blind, CHF, PAD, hx of GIB admitted for right hip pain, fall hitting head and confusion. No tPA given due to outside window.    Stroke, incidental finding:  left CR/SO infarct likely secondary to small vessel disease source MRI small acute/early  subacute infarct of the left corona radiata.  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 CTA head and neck unremarkable 2D Echo EF 55 to 60% LDL 113 HgbA1c pending SCDs for VTE prophylaxis clopidogrel 75 mg daily prior to admission, now on aspirin 81 mg daily and clopidogrel 75 mg daily DAPT for 3 weeks and then Plavix alone. Patient counseled to be compliant with his antithrombotic medications Ongoing aggressive stroke risk factor management Therapy recommendations: SNF Disposition: Pending  Hypertension Stable Long term BP goal normotensive  Hyperlipidemia Home meds: None LDL 113, goal < 70 Now on Lipitor 20 No high intensity statin given advanced age Continue statin at discharge  Other Stroke Risk Factors Advanced age PAD  Right hip periprosthetic fracture Hx of right hip total arthroplasty Admitted for fall and right hip pain 11/24 X-ray showed Acute, minimally displaced perihardware fracture of the intratrochanteric right femur about the femoral stem component Orthopedics on board  Other Active Problems Left eye blind RA on sulfasalazine Likely dementia versus cognitive impairment  Hospital day # 4  Neurology will sign off. Please call with questions. Pt will follow up with stroke clinic NP at [facility] in about 4 weeks. Thanks for the consult.   Rosalin Hawking, MD PhD Stroke Neurology 10/01/2022 12:24 PM    To contact Stroke Continuity provider, please refer to [email or web address] After hours, contact General Neurology

## 2022-10-01 NOTE — Progress Notes (Addendum)
Occupational Therapy Treatment Patient Details Name: Nathaniel [name] MRN: [id number] DOB: 10/30/1935 Today's Date: 10/01/2022   History of present illness Pt is a 87 yr old male who presented 11/24 due to a fall at home while using a leaf blower and holding onto his walker with one hand. CT of pelvis showed minimal displaced perihardware fx  and CT of head showed hematoma of head without fx. During hospitalization pt with increased confusion. 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 PMH: hypertension, GERD, rheumatoid arthritis, chronic pain, glaucoma with left eye blindness, chronic combined CHF, history of GI bleed, Rt THA x3, TKA.   OT comments  Pt seen in conjunction with PT to maximize pts activity tolerance and optimize pt participation. Pt continues to present with increased pain, decreased activity tolerance, impaired balance and generalized deconditioning impacting pts ability to complete BADLs independently. Pt currently requires MAX A +2 for ADL transfers with RW, pt with L lean in standing and increased pain with all functional mobility. Pt completed seated grooming tasks with supervision. Pt would continue to benefit from skilled occupational therapy while admitted and after d/c to address the below listed limitations in order to improve overall functional mobility and facilitate independence with BADL participation. DC plan remains appropriate, will follow acutely per POC.       Recommendations for follow up therapy are one component of a multi-disciplinary discharge planning process, led by the attending physician.  Recommendations may be updated based on patient status, additional functional criteria and insurance authorization.    Follow Up Recommendations  Skilled nursing-short term rehab (<3 hours/day)     Assistance Recommended at Discharge Frequent or constant Supervision/Assistance   Patient can return home with the following  A lot of help with walking and/or transfers;A lot of help with bathing/dressing/bathroom;Assistance with cooking/housework;Assist for transportation;Help with stairs or ramp for entrance   Equipment Recommendations  None recommended by OT    Recommendations for Other Services      Precautions / Restrictions Precautions Precautions: Fall Restrictions Weight Bearing Restrictions: Yes RLE Weight Bearing: Weight bearing as tolerated       Mobility Bed Mobility Overal bed mobility: Needs Assistance Bed Mobility: Supine to Sit     Supine to sit: Max assist, HOB elevated     General bed mobility comments: assistance to move BLE off EOB then upright trunk, cuing for UE placement to assist with uprighting self    Transfers Overall transfer level: Needs assistance Equipment used: Rolling walker (2 wheels) Transfers: Sit to/from Stand, Bed to chair/wheelchair/BSC Sit to Stand: +2 physical assistance, Mod assist     Step pivot transfers: Max assist, +2 physical assistance     General transfer comment: Pt with flexed posture & left lateral lean, decreased weight shift to RLE that impacts stepping with LLE. Poor eccentric control during stand>sit.     Balance Overall balance assessment: Needs assistance Sitting-balance support: Feet supported, Bilateral upper extremity supported Sitting balance-Leahy Scale: Poor Sitting balance - Comments: CGA static sitting EOB once therapists assisted pt with midline sitting vs posterior lean Postural control: Posterior lean Standing balance support: Bilateral upper extremity supported, Reliant on assistive device for balance Standing balance-Leahy Scale: Zero Standing balance comment: L lateral lean                           ADL either performed or assessed with clinical  judgement   ADL Overall ADL's : Needs assistance/impaired     Grooming: Wash/dry face;Oral  care;Sitting;Supervision/safety;Set up;Cueing for sequencing, noted to switch off hands when completing oral care           Upper Body Dressing : Minimal assistance;Sitting Upper Body Dressing Details (indicate cue type and reason): to don back side gown     Toilet Transfer: Cueing for safety;Cueing for sequencing;Rolling walker (2 wheels);Maximal assistance;+2 for physical assistance Toilet Transfer Details (indicate cue type and reason): simulated via stand pivot to recliner with Rw         Functional mobility during ADLs: Moderate assistance;Cueing for safety;Cueing for sequencing;Rolling walker (2 wheels);Maximal assistance;+2 for safety/equipment;+2 for physical assistance General ADL Comments: ADl participation impacted by increased pain and decreased activity tolerance    Extremity/Trunk Assessment Upper Extremity Assessment Upper Extremity Assessment: Generalized weakness, difficult to assess BUEs d/t cog and perseveration on pain    Lower Extremity Assessment Lower Extremity Assessment: Defer to PT evaluation        Vision Baseline Vision/History: 1 Wears glasses (blind in L eye) Patient Visual Report: No change from baseline     Perception Perception Perception: Within Functional Limits   Praxis Praxis Praxis: Intact    Cognition Arousal/Alertness: Awake/alert Behavior During Therapy: WFL for tasks assessed/performed Overall Cognitive Status: Impaired/Different from baseline Area of Impairment: Orientation, Attention, Memory, Following commands, Safety/judgement, Awareness, Problem solving                 Orientation Level: Disoriented to, Time, Place, Situation Current Attention Level: Focused Memory: Decreased recall of precautions, Decreased short-term memory Following Commands: Follows one step commands inconsistently, Follows one step commands with increased time Safety/Judgement: Decreased awareness of safety, Decreased awareness of  deficits Awareness: Intellectual Problem Solving: Difficulty sequencing, Slow processing, Requires verbal cues, Requires tactile cues, Decreased initiation General Comments: PT orients pt on location & situation but pt unable to recall location at end of session, unable to select choice when give 2 re: location. Follows simple commands inconsistently.        Exercises      Shoulder Instructions       General Comments      Pertinent Vitals/ Pain       Pain Assessment Pain Assessment: Faces Faces Pain Scale: Hurts little more Pain Location: R hip with movement Pain Descriptors / Indicators: Grimacing, Guarding, Discomfort Pain Intervention(s): Monitored during session, Repositioned  Home Living                                          Prior Functioning/Environment              Frequency  Min 2X/week        Progress Toward Goals  OT Goals(current goals can now be found in the care plan section)  Progress towards OT goals: Progressing toward goals  Acute Rehab OT Goals Patient Stated Goal: none stated Time For Goal Achievement: 10/11/22 Potential to Achieve Goals: [place] Discharge plan remains appropriate;Frequency remains appropriate    Co-evaluation      Reason for Co-Treatment: Complexity of the patient's impairments (multi-system involvement);Necessary to address cognition/behavior during functional activity;For patient/therapist safety PT goals addressed during session: Mobility/safety with mobility;Balance;Proper use of DME        AM-PAC OT "6 Clicks" Daily Activity     Outcome Measure   Help from another person  eating meals?: A Little Help from another person taking care of personal grooming?: A Little Help from another person toileting, which includes using toliet, bedpan, or urinal?: A Lot Help from another person bathing (including washing, rinsing, drying)?: A Lot Help from another person to put on and taking off regular  upper body clothing?: A Little Help from another person to put on and taking off regular lower body clothing?: A Lot 6 Click Score: 15    End of Session Equipment Utilized During Treatment: Gait belt;Rolling walker (2 wheels)  OT Visit Diagnosis: Unsteadiness on feet (R26.81);Other abnormalities of gait and mobility (R26.89);Repeated falls (R29.6);Muscle weakness (generalized) (M62.81);History of falling (Z91.81);Pain Pain - Right/Left: Right Pain - part of body: Hip   Activity Tolerance Patient tolerated treatment well;Patient limited by pain   Patient Left in chair;with call bell/phone within reach;with chair alarm set   Nurse Communication Mobility status;Other (comment) (stedy back to bed)        Time: 2620-3559 OT Time Calculation (min): 23 min  Charges: OT General Charges $OT Visit: 1 Visit OT Treatments $Self Care/Home Management : 8-22 mins  Harley Alto., COTA/L Acute Rehabilitation Services [phone]   Precious Haws 10/01/2022, 2:40 PM

## 2022-10-01 NOTE — Discharge Summary (Addendum)
Physician Discharge Summary  CHRISTOP HIPPERT ZOX:[id number] DOB: 01-Aug-1935 DOA: 09/26/2022  PCP: Shirline Frees, MD  Admit date: 09/26/2022 Discharge date: 10/01/2022  Admitted From: home Discharge disposition: SNF   Recommendations for Outpatient Follow-Up:   Asa/plavix x 3 weeks then plavix alone-- monitor closely for bleeding start on PPI while on ASA Neurology follow up Consider palliative care referral at SNF pending progress  Follow-up with CT chest as an outpatient in 6 to 12 months per radiology While at SNF can give methotrexate as oral form or injection pending availability   Discharge Diagnosis:   Principal Problem:   Peri-prosthetic fracture around prosthetic hip Active Problems:   Rheumatoid arthritis ([place])   Chronic combined systolic and diastolic CHF (congestive heart failure) ([place])   Essential hypertension   Hypokalemia   Hematoma of right hip   Traumatic hematoma of forehead   Fracture of spinous process of lumbar vertebra ([place])   Incidental pulmonary nodule   Metabolic acidosis   Microscopic hematuria   Hip fracture ([place])    Discharge Condition: Improved.  Diet recommendation: regular  Wound care: None.  Code status: Full.   History of Present Illness:   Nathaniel [name] is a 87 y.o. male with medical history significant of hypertension, GERD, rheumatoid arthritis, chronic pain, glaucoma with left eye blindness, chronic combined CHF, history of GI bleed, peripheral arterial disease on Plavix, BPH, history of right hip total arthroplasty presented to the ED with right hip pain after a mechanical fall.  He did strike his head.  Slightly hypertensive but remainder of vital signs stable.  Labs showing no leukocytosis or anemia, potassium 3.3, bicarb 20, anion gap 14, glucose 103, creatinine 1.1, blood ethanol level undetectable, UA with evidence of microscopic hematuria.  Chest x-ray negative for acute finding.    Hospital Course by Problem:    Right hip periprosthetic fracture, right hip hematoma-orthopedic surgery consulted, Dr. Rhona Raider saw patient on 11/25, and fracture looks stable and hardware does not look compromised.  Recommends nonoperative management. -Mobilize with PT/OT--- SNF pending -Discussed with orthopedic surgery, Plavix was resumed yesterday   Acute metabolic encephalopathy-patient alert and oriented x 4 on Sunday, but had increased confusion on Monday.  His baclofen, gabapentin, trazodone all discontinued.  Despite that, remained confused and underwent an MRI last night which showed acute/subacute CVA -resume prior meds except for high dose Neurontin  -delirium precautions    Acute CVA-MRI of the brain done on 11/27 shows small acute/early subacute infarct of the left radiata without hemorrhage or mass effect.  It also showed multiple old cerebellar infarcts and findings of chronic small vessel ischemia.  Neurology consulted, appreciate input.-- ASA/plavix x 3 weeks then plavix alone   Right supraorbital and forehead swelling/hematoma -No underlying calvarial or facial bone fracture seen on CT.   Subacute left L3 spinous process fracture - denies back pain   Incidental pulmonary nodule -CT showing a 6 mm nodule in the right lung apex.  Patient is a former smoker.  Follow-up with CT chest as an outpatient in 6 to 12 months per radiology   Mild hypokalemia -potassium normalized, continue to monitor   Mild normal anion gap metabolic acidosis -resolved   Microscopic hematuria-asymptomatic.  Outpatient follow-up   Hypertension -has been placed on Coreg as well as losartan -resume home norvasc-- can stop if BP low   Rheumatoid arthritis-On weekly Actemra and methotrexate, continue prednisone and sulfasalazine   Chronic combined CHF -No signs of volume overload.  Echo done June 2016 showing LVEF 40 to 38%, grade 1 diastolic dysfunction, and trivial AVR. Repeat echocardiogram ordered by admitting MD showing  worsening EF EF of 25-05%, grade 1 diastolic dysfunction.  Cardiology consulted, felt like the echo done on admission had very poor windows, and it was repeated yesterday with Definity contrast, showing actually normal EF.   Peripheral arterial disease -continue Plavix    Medical Consultants:   [facility] Neurology ortho   Discharge Exam:   Vitals:   10/01/22 0335 10/01/22 0821  BP: 138/81 (!) 152/83  Pulse: 92 100  Resp: 17 18  Temp: 98.1 F (36.7 C) 98 F (36.7 C)  SpO2: 96% 97%   Vitals:   09/30/22 1228 09/30/22 2017 10/01/22 0335 10/01/22 0821  BP: 128/81 135/73 138/81 (!) 152/83  Pulse: 97 95 92 100  Resp: '18 18 17 18  '$ Temp: 98.1 F (36.7 C) 98.3 F (36.8 C) 98.1 F (36.7 C) 98 F (36.7 C)  TempSrc: Oral Oral  Oral  SpO2: 97% 97% 96% 97%  Weight:      Height:        General exam: Appears calm and comfortable.     -called wife, no answer  The results of significant diagnostics from this hospitalization (including imaging, microbiology, ancillary and laboratory) are listed below for reference.     Procedures and Diagnostic Studies:   ECHOCARDIOGRAM COMPLETE  Result Date: 09/27/2022    ECHOCARDIOGRAM REPORT   Patient Name:   AAREN ATALLAH Date of Exam: 09/27/2022 Medical Rec #:  [id number]    Height:       70.0 in Accession #:    [id number]   Weight:       162.0 lb Date of Birth:  [birth year]    BSA:          1.909 m Patient Age:    52 years     BP:           128/75 mmHg Patient Gender: M            HR:           73 bpm. Exam Location:  Inpatient Procedure: 2D Echo, Cardiac Doppler and Color Doppler Indications:    CHF-Acute Systolic Z32.99  History:        Patient has prior history of Echocardiogram examinations, most                 recent 04/20/2015. CHF, PAD; Risk Factors:Hypertension.  Sonographer:    Ronny Flurry Referring Phys: [id number] [place]  1. Images are limited.  2. Left ventricular ejection fraction, by estimation, is 25 to 30%.  The left ventricle has severely decreased function. Left ventricular endocardial border not optimally defined to evaluate regional wall motion. There is mild left ventricular hypertrophy. Left ventricular diastolic parameters are consistent with Grade I diastolic dysfunction (impaired relaxation).  3. Right ventricular systolic function is mildly reduced. The right ventricular size is normal. Tricuspid regurgitation signal is inadequate for assessing PA pressure.  4. The mitral valve is degenerative. Trivial mitral valve regurgitation.  5. The aortic valve is tricuspid. Aortic valve regurgitation is not visualized. Aortic valve sclerosis is present, with no evidence of aortic valve stenosis. Aortic valve mean gradient measures 2.0 mmHg.  6. Unable to estimate CVP. Comparison(s): Prior images unable to be directly viewed. FINDINGS  Left Ventricle: Left ventricular ejection fraction, by estimation, is 25 to 30%. The left ventricle has severely decreased function. Left ventricular endocardial border not  optimally defined to evaluate regional wall motion. The left ventricular internal cavity size was normal in size. There is mild left ventricular hypertrophy. Left ventricular diastolic parameters are consistent with Grade I diastolic dysfunction (impaired relaxation). Right Ventricle: The right ventricular size is normal. No increase in right ventricular wall thickness. Right ventricular systolic function is mildly reduced. Tricuspid regurgitation signal is inadequate for assessing PA pressure. Left Atrium: Left atrial size was normal in size. Right Atrium: Right atrial size was normal in size. Pericardium: There is no evidence of pericardial effusion. Mitral Valve: The mitral valve is degenerative in appearance. There is mild calcification of the mitral valve leaflet(s). Mild mitral annular calcification. Trivial mitral valve regurgitation. Tricuspid Valve: The tricuspid valve is grossly normal. Tricuspid valve  regurgitation is trivial. Aortic Valve: The aortic valve is tricuspid. There is mild aortic valve annular calcification. Aortic valve regurgitation is not visualized. Aortic valve sclerosis is present, with no evidence of aortic valve stenosis. Aortic valve mean gradient measures  2.0 mmHg. Aortic valve peak gradient measures 4.8 mmHg. Aortic valve area, by VTI measures 2.52 cm. Pulmonic Valve: The pulmonic valve was not well visualized. Pulmonic valve regurgitation is trivial. Aorta: The aortic root is normal in size and structure. Venous: Unable to estimate CVP. The inferior vena cava was not well visualized. IAS/Shunts: No atrial level shunt detected by color flow Doppler.  LEFT VENTRICLE PLAX 2D LVOT diam:     2.00 cm   Diastology LV SV:         58        LV e' medial:    3.59 cm/s LV SV Index:   30        LV E/e' medial:  17.0 LVOT Area:     3.14 cm  LV e' lateral:   6.96 cm/s                          LV E/e' lateral: 8.8  RIGHT VENTRICLE RV S prime:     11.70 cm/s TAPSE (M-mode): 1.9 cm LEFT ATRIUM             Index        RIGHT ATRIUM           Index LA Vol (A2C):   36.1 ml 18.92 ml/m  RA Area:     14.80 cm LA Vol (A4C):   28.5 ml 14.93 ml/m  RA Volume:   35.60 ml  18.65 ml/m LA Biplane Vol: 31.8 ml 16.66 ml/m  AORTIC VALVE AV Area (Vmax):    2.57 cm AV Area (Vmean):   2.53 cm AV Area (VTI):     2.52 cm AV Vmax:           109.00 cm/s AV Vmean:          71.900 cm/s AV VTI:            0.229 m AV Peak Grad:      4.8 mmHg AV Mean Grad:      2.0 mmHg LVOT Vmax:         89.00 cm/s LVOT Vmean:        58.000 cm/s LVOT VTI:          0.184 m LVOT/AV VTI ratio: 0.80  AORTA Ao Root diam: 3.60 cm MITRAL VALVE MV Area (PHT): 2.73 cm     SHUNTS MV Decel Time: 278 msec     Systemic VTI:  0.18 m MV E velocity:  61.10 cm/s   Systemic Diam: 2.00 cm MV A velocity: 101.00 cm/s MV E/A ratio:  0.60 Rozann Lesches MD Electronically signed by Rozann Lesches MD Signature Date/Time: 09/27/2022/2:01:07 PM    Final    CT  HEAD WO CONTRAST  Result Date: 09/26/2022 CLINICAL DATA:  Fall EXAM: CT HEAD WITHOUT CONTRAST CT MAXILLOFACIAL WITHOUT CONTRAST CT CERVICAL SPINE WITHOUT CONTRAST TECHNIQUE: Multidetector CT imaging of the head, cervical spine, and maxillofacial structures were performed using the standard protocol without intravenous contrast. Multiplanar CT image reconstructions of the cervical spine and maxillofacial structures were also generated. RADIATION DOSE REDUCTION: This exam was performed according to the departmental dose-optimization program which includes automated exposure control, adjustment of the mA and/or kV according to patient size and/or use of iterative reconstruction technique. COMPARISON:  CT head 02/14/2022, MR head 04/17/2018 FINDINGS: CT HEAD FINDINGS Brain: There is no acute intracranial hemorrhage, extra-axial fluid collection, or acute infarct. Parenchymal volume is normal for age. The ventricles are normal in size. Gray-white differentiation is preserved. 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 An additional small focus of hypodensity in the centrum semiovale on the left more posteriorly may reflect background chronic small-vessel ischemic change or interval infarct, though still remote in appearance. There is no mass lesion.  There is no mass effect or midline shift. Vascular: There is calcification of the bilateral carotid siphons. Skull: Normal. Negative for fracture or focal lesion. Other: None. CT MAXILLOFACIAL FINDINGS Osseous: There is no acute facial bone fracture. There is no evidence of mandibular dislocation. There is no suspicious osseous lesion. Orbits: There is significant right periorbital soft tissue swelling with a right frontal scalp hematoma. There is no retrobulbar hematoma. The globe is intact. 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 There is a punctate metallic  density foreign body in the left globe, present in 2016. Note that at least 2 brain MRIs were obtained after the CT in 2016 which demonstrated this foreign body. Sinuses: Clear. Soft tissues: Right supraorbital soft tissue swelling and frontal scalp swelling/hematoma as above. CT CERVICAL SPINE FINDINGS Alignment: There is trace anterolisthesis of C4 on C5, C5 on C6, and C7 on T1. There is no jumped or perched facet or other evidence of traumatic malalignment. Skull base and vertebrae: Skull base alignment is maintained. There is no evidence of acute fracture. There is no suspicious osseous lesion. Soft tissues and spinal canal: No prevertebral fluid or swelling. No visible canal hematoma. Disc levels: There is disc space narrowing and degenerative endplate change most advanced at C2-C3 and C6-C7. Facet arthropathy is most advanced on the left at C3-C4. There is no evidence of high-grade spinal canal stenosis. Upper chest: There is fibrotic change with architectural distortion/scarring in both lung apices. There is a 6 mm nodule in the right lung apex. Other: None. IMPRESSION: 1. No acute intracranial pathology. 2. Right supraorbital and forehead swelling/hematoma without underlying calvarial or facial bone fracture. 3. No acute fracture or traumatic malalignment of the cervical spine. 4. 6 mm nodule in the right lung apex. In the absence of prior studies for comparison, non-contrast chest CT at 6-12 months is recommended, depending on patient comorbidities given age. Electronically Signed   By: Valetta Mole M.D.   On: 09/26/2022 18:50   CT MAXILLOFACIAL WO CONTRAST  Result Date: 09/26/2022 CLINICAL DATA:  Fall EXAM: CT HEAD WITHOUT CONTRAST CT MAXILLOFACIAL WITHOUT CONTRAST CT CERVICAL SPINE  WITHOUT CONTRAST TECHNIQUE: Multidetector CT imaging of the head, cervical spine, and maxillofacial structures were performed using the standard protocol without intravenous contrast. Multiplanar CT image reconstructions  of the cervical spine and maxillofacial structures were also generated. RADIATION DOSE REDUCTION: This exam was performed according to the departmental dose-optimization program which includes automated exposure control, adjustment of the mA and/or kV according to patient size and/or use of iterative reconstruction technique. COMPARISON:  CT head 02/14/2022, MR head 04/17/2018 FINDINGS: CT HEAD FINDINGS Brain: There is no acute intracranial hemorrhage, extra-axial fluid collection, or acute infarct. Parenchymal volume is normal for age. The ventricles are normal in size. Gray-white differentiation is preserved. 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 An additional small focus of hypodensity in the centrum semiovale on the left more posteriorly may reflect background chronic small-vessel ischemic change or interval infarct, though still remote in appearance. There is no mass lesion.  There is no mass effect or midline shift. Vascular: There is calcification of the bilateral carotid siphons. Skull: Normal. Negative for fracture or focal lesion. Other: None. CT MAXILLOFACIAL FINDINGS Osseous: There is no acute facial bone fracture. There is no evidence of mandibular dislocation. There is no suspicious osseous lesion. Orbits: There is significant right periorbital soft tissue swelling with a right frontal scalp hematoma. There is no retrobulbar hematoma. The globe is intact. 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 There is a punctate metallic density foreign body in the left globe, present in 2016. Note that at least 2 brain MRIs were obtained after the CT in 2016 which demonstrated this foreign body. Sinuses: Clear. Soft tissues: Right supraorbital soft tissue swelling and frontal scalp swelling/hematoma as above. CT CERVICAL SPINE FINDINGS Alignment: There is trace anterolisthesis of C4 on C5, C5 on C6,  and C7 on T1. There is no jumped or perched facet or other evidence of traumatic malalignment. Skull base and vertebrae: Skull base alignment is maintained. There is no evidence of acute fracture. There is no suspicious osseous lesion. Soft tissues and spinal canal: No prevertebral fluid or swelling. No visible canal hematoma. Disc levels: There is disc space narrowing and degenerative endplate change most advanced at C2-C3 and C6-C7. Facet arthropathy is most advanced on the left at C3-C4. There is no evidence of high-grade spinal canal stenosis. Upper chest: There is fibrotic change with architectural distortion/scarring in both lung apices. There is a 6 mm nodule in the right lung apex. Other: None. IMPRESSION: 1. No acute intracranial pathology. 2. Right supraorbital and forehead swelling/hematoma without underlying calvarial or facial bone fracture. 3. No acute fracture or traumatic malalignment of the cervical spine. 4. 6 mm nodule in the right lung apex. In the absence of prior studies for comparison, non-contrast chest CT at 6-12 months is recommended, depending on patient comorbidities given age. Electronically Signed   By: Valetta Mole M.D.   On: 09/26/2022 18:50   CT CERVICAL SPINE WO CONTRAST  Result Date: 09/26/2022 CLINICAL DATA:  Fall EXAM: CT HEAD WITHOUT CONTRAST CT MAXILLOFACIAL WITHOUT CONTRAST CT CERVICAL SPINE WITHOUT CONTRAST TECHNIQUE: Multidetector CT imaging of the head, cervical spine, and maxillofacial structures were performed using the standard protocol without intravenous contrast. Multiplanar CT image reconstructions of the cervical spine and maxillofacial structures were also generated. RADIATION DOSE REDUCTION: This exam was performed according to the departmental dose-optimization program which includes automated exposure control, adjustment of the mA and/or kV  according to patient size and/or use of iterative reconstruction technique. COMPARISON:  CT head 02/14/2022, MR head  04/17/2018 FINDINGS: CT HEAD FINDINGS Brain: There is no acute intracranial hemorrhage, extra-axial fluid collection, or acute infarct. Parenchymal volume is normal for age. The ventricles are normal in size. Gray-white differentiation is preserved. 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 An additional small focus of hypodensity in the centrum semiovale on the left more posteriorly may reflect background chronic small-vessel ischemic change or interval infarct, though still remote in appearance. There is no mass lesion.  There is no mass effect or midline shift. Vascular: There is calcification of the bilateral carotid siphons. Skull: Normal. Negative for fracture or focal lesion. Other: None. CT MAXILLOFACIAL FINDINGS Osseous: There is no acute facial bone fracture. There is no evidence of mandibular dislocation. There is no suspicious osseous lesion. Orbits: There is significant right periorbital soft tissue swelling with a right frontal scalp hematoma. There is no retrobulbar hematoma. The globe is intact. 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 There is a punctate metallic density foreign body in the left globe, present in 2016. Note that at least 2 brain MRIs were obtained after the CT in 2016 which demonstrated this foreign body. Sinuses: Clear. Soft tissues: Right supraorbital soft tissue swelling and frontal scalp swelling/hematoma as above. CT CERVICAL SPINE FINDINGS Alignment: There is trace anterolisthesis of C4 on C5, C5 on C6, and C7 on T1. There is no jumped or perched facet or other evidence of traumatic malalignment. Skull base and vertebrae: Skull base alignment is maintained. There is no evidence of acute fracture. There is no suspicious osseous lesion. Soft tissues and spinal canal: No prevertebral fluid or swelling. No visible canal hematoma. Disc levels: There is disc space  narrowing and degenerative endplate change most advanced at C2-C3 and C6-C7. Facet arthropathy is most advanced on the left at C3-C4. There is no evidence of high-grade spinal canal stenosis. Upper chest: There is fibrotic change with architectural distortion/scarring in both lung apices. There is a 6 mm nodule in the right lung apex. Other: None. IMPRESSION: 1. No acute intracranial pathology. 2. Right supraorbital and forehead swelling/hematoma without underlying calvarial or facial bone fracture. 3. No acute fracture or traumatic malalignment of the cervical spine. 4. 6 mm nodule in the right lung apex. In the absence of prior studies for comparison, non-contrast chest CT at 6-12 months is recommended, depending on patient comorbidities given age. Electronically Signed   By: Valetta Mole M.D.   On: 09/26/2022 18:50   CT PELVIS WO CONTRAST  Result Date: 09/26/2022 CLINICAL DATA:  Fall hip fracture suspected EXAM: CT PELVIS WITHOUT CONTRAST TECHNIQUE: Multidetector CT imaging of the pelvis was performed following the standard protocol without intravenous contrast. RADIATION DOSE REDUCTION: This exam was performed according to the departmental dose-optimization program which includes automated exposure control, adjustment of the mA and/or kV according to patient size and/or use of iterative reconstruction technique. COMPARISON:  Same day radiographs FINDINGS: Urinary Tract:  No abnormality visualized. Bowel:  Unremarkable visualized pelvic bowel loops. Vascular/Lymphatic: No pathologically enlarged lymph nodes. Aortic atherosclerosis. Reproductive:  No mass or other significant abnormality Other:  None. Musculoskeletal: Status post right hip total arthroplasty. Minimally displaced perihardware fracture about the posterior aspect of the intratrochanteric right femur and right femoral arthroplasty component (series 3, image 114, 129). Overlying soft tissue contusion and hematoma. The acetabular component  and bony  acetabulum appear intact. No other displaced fracture or dislocation of the proximal left femur or bony pelvis. Subacute appearing, partially callused fracture of the left L3 spinous process, partially included (series 3, image 1). Posterior osteopenia. IMPRESSION: 1. Status post right hip total arthroplasty. 2. Minimally displaced perihardware fracture about the posterior aspect of the intratrochanteric right femur and right femoral arthroplasty component. 3. Overlying soft tissue contusion and hematoma. 4. No other displaced fracture or dislocation of the proximal left femur or bony pelvis. 5. Subacute appearing, partially callused fracture of the left L3 spinous process, partially imaged. 6. Osteopenia, which significantly limits sensitivity for nondisplaced fracture. Aortic Atherosclerosis (ICD10-I70.0). Electronically Signed   By: Delanna Ahmadi M.D.   On: 09/26/2022 18:30   DG Pelvis Portable  Result Date: 09/26/2022 CLINICAL DATA:  [place]. EXAM: PORTABLE PELVIS 1-2 VIEWS COMPARISON:  Radiographs 02/14/2022 FINDINGS: The right hip components are intact. No dislocation. Suspect a small nondisplaced fracture involving the lesser trochanter. The bony pelvis and left hip are intact. IMPRESSION: Suspect small nondisplaced fracture involving the right lesser trochanter. Electronically Signed   By: Marijo Sanes M.D.   On: 09/26/2022 18:07   DG Femur Min 2 Views Right  Result Date: 09/26/2022 CLINICAL DATA:  Fall, pain EXAM: RIGHT FEMUR 2 VIEWS COMPARISON:  None Available. FINDINGS: Status post right hip total arthroplasty and right knee total arthroplasty. Acute, minimally displaced perihardware fracture of the intratrochanteric right femur about the femoral stem component, best appreciated on cross-table lateral view. No perihardware fracture about the knee. Soft tissues unremarkable. IMPRESSION: 1. Status post right hip total arthroplasty and right knee total arthroplasty. 2. Acute, minimally displaced  perihardware fracture of the intratrochanteric right femur about the femoral stem component. 3.  No perihardware fracture about the knee. Electronically Signed   By: Delanna Ahmadi M.D.   On: 09/26/2022 18:06   DG Chest Port 1 View  Result Date: 09/26/2022 CLINICAL DATA:  [place]. EXAM: PORTABLE CHEST 1 VIEW COMPARISON:  04/17/2018 FINDINGS: The cardiac silhouette, mediastinal and hilar contours are within normal limits and stable. No acute pulmonary findings. No pleural effusion or pneumothorax. The bony thorax is intact. IMPRESSION: No acute cardiopulmonary findings. Electronically Signed   By: Marijo Sanes M.D.   On: 09/26/2022 18:04     Labs:   Basic Metabolic Panel: Recent Labs  Lab 09/26/22 1739 09/26/22 1749 09/27/22 0315 09/28/22 0331 09/30/22 0422  NA 143 141 144 141 135  K 3.5 3.3* 3.4* 4.2 3.7  CL 109 108 110 109 103  CO2 20*  --  '25 24 22  '$ GLUCOSE 103* 102* 112* 104* 124*  BUN 8 7* 6* 7* 13  CREATININE 1.10 1.00 0.83 0.99 0.96  CALCIUM 9.4  --  9.2 8.8* 9.3  MG  --   --  1.8 1.8 1.9   GFR Estimated Creatinine Clearance: 58.1 mL/min (by C-G formula based on SCr of 0.96 mg/dL). Liver Function Tests: Recent Labs  Lab 09/26/22 1739 09/28/22 0331 09/30/22 0422  AST 24 19 34  ALT '14 14 20  '$ ALKPHOS 71 56 52  BILITOT 0.4 0.7 1.5*  PROT 5.7* 5.0* 6.3*  ALBUMIN 3.9 3.2* 4.3   No results for input(s): "LIPASE", "AMYLASE" in the last 168 hours. Recent Labs  Lab 09/30/22 0422  AMMONIA 22   Coagulation profile No results for input(s): "INR", "PROTIME" in the last 168 hours.  CBC: Recent Labs  Lab 09/26/22 1739 09/26/22 1749 09/27/22 0315 09/28/22 0331 09/30/22 0422  WBC  8.9  --  8.1 7.0 15.2*  HGB 13.6 13.6 12.7* 12.4* 14.1  HCT 43.9 40.0 39.2 38.5* 42.9  MCV 109.2*  --  105.4* 107.2* 103.6*  PLT 151  --  143* 141* 178   Cardiac Enzymes: No results for input(s): "CKTOTAL", "CKMB", "CKMBINDEX", "TROPONINI" in the last 168 hours. BNP: Invalid input(s):  "POCBNP" CBG: No results for input(s): "GLUCAP" in the last 168 hours. D-Dimer No results for input(s): "DDIMER" in the last 72 hours. Hgb A1c No results for input(s): "HGBA1C" in the last 72 hours. Lipid Profile Recent Labs    10/01/22 0543  CHOL 184  HDL 55  LDLCALC 113*  TRIG 81  CHOLHDL 3.3   Thyroid function studies No results for input(s): "TSH", "T4TOTAL", "T3FREE", "THYROIDAB" in the last 72 hours.  Invalid input(s): "FREET3" Anemia work up Recent Labs    09/30/22 0422  [id number] 1,977*   Microbiology No results found for this or any previous visit (from the past 240 hour(s)).   Discharge Instructions:   Discharge Instructions     Diet general   Complete by: As directed    Increase activity slowly   Complete by: As directed       Allergies as of 10/01/2022       Reactions   Ibuprofen Hives   Cannot take "Advil", but is able to tolerate ibuprofen   Tylenol [acetaminophen] Rash        Medication List     STOP taking these medications    baclofen 10 MG tablet Commonly known as: LIORESAL   gabapentin 800 MG tablet Commonly known as: NEURONTIN       TAKE these medications    Actemra ACTPen 162 MG/0.9ML [place] Generic drug: Tocilizumab Inject 162 mg into the skin every Thursday.   amLODipine 10 MG tablet Commonly known as: NORVASC Take 1 tablet (10 mg total) by mouth daily.   ascorbic acid 500 MG tablet Commonly known as: VITAMIN C Take 500 mg by mouth daily.   aspirin 81 MG chewable tablet Chew 1 tablet (81 mg total) by mouth daily for 20 days.   atorvastatin 20 MG tablet Commonly known as: LIPITOR Take 1 tablet (20 mg total) by mouth daily. Start taking on: October 02, 2022   brimonidine 0.2 % ophthalmic solution Commonly known as: ALPHAGAN Place 1 drop into both eyes in the morning and at bedtime.   carvedilol 3.125 MG tablet Commonly known as: COREG Take 1 tablet (3.125 mg total) by mouth 2 (two) times daily with a  meal.   cetirizine 10 MG tablet Commonly known as: ZYRTEC Take 10 mg by mouth daily.   clopidogrel 75 MG tablet Commonly known as: PLAVIX Take 1 tablet (75 mg total) by mouth daily.   cyanocobalamin 1000 MCG tablet Commonly known as: VITAMIN B12 Take 1,000 mcg by mouth daily.   diclofenac sodium 1 % Gel Commonly known as: VOLTAREN Apply 1 application. topically in the morning and at bedtime. Left knee and both feet   dorzolamide 2 % ophthalmic solution Commonly known as: TRUSOPT Place 1 drop into both eyes 2 (two) times daily.   ferrous sulfate 325 (65 FE) MG tablet Take 325 mg by mouth daily with breakfast.   Fish Oil 1000 MG Cpdr Take 1,000 mg by mouth daily.   folic acid 1 MG tablet Commonly known as: FOLVITE Take 1 mg by mouth at bedtime.   HYDROcodone-acetaminophen 5-325 MG tablet Commonly known as: NORCO/VICODIN Take 1-2 tablets by mouth every 6 (  six) hours as needed for severe pain. What changed:  how much to take when to take this reasons to take this   losartan 50 MG tablet Commonly known as: COZAAR Take 1 tablet (50 mg total) by mouth daily. Start taking on: October 02, 2022   methotrexate 50 MG/2ML injection Inject 25 mg into the skin every Wednesday.   pantoprazole 40 MG tablet Commonly known as: PROTONIX Take 1 tablet (40 mg total) by mouth daily. Start taking on: October 02, 2022   polyethylene glycol 17 g packet Commonly known as: MIRALAX / GLYCOLAX Take 17 g by mouth at bedtime.   predniSONE 5 MG tablet Commonly known as: DELTASONE Take 5 mg by mouth in the morning and at bedtime.   sulfaSALAzine 500 MG tablet Commonly known as: AZULFIDINE Take 1,000 mg by mouth 2 (two) times daily with breakfast and lunch.   tamsulosin 0.4 MG Caps capsule Commonly known as: FLOMAX Take 0.4 mg by mouth at bedtime.   traZODone 150 MG tablet Commonly known as: DESYREL Take 150 mg by mouth at bedtime.   TURMERIC PO Take 1 capsule by mouth  daily.          Time coordinating discharge: 45 min  Signed:  Geradine Girt DO  [facility] 10/01/2022, 1:46 PM

## 2022-10-01 NOTE — TOC Progression Note (Signed)
Transition of Care [facility]) - Progression Note    Patient Details  Name: Nathaniel [name] MRN: [id number] Date of Birth: 10-Jan-1935  [facility] CM/SW Contact  Joanne Chars, LCSW Phone Number: 10/01/2022, 10:10 AM  Clinical Narrative:  Auth approved in [place]: [id number], [id number], 3 days: 11/28-11/30.  MD informed.      Expected Discharge Plan: [place] Barriers to Discharge: Continued Medical Work up  Expected Discharge Plan and Services Expected Discharge Plan: [place] In-house Referral: Clinical Social Work Discharge Planning Services: CM Consult Post Acute Care Choice: [place] arrangements for the past 2 months: Single Family Home                                       Social Determinants of Health (SDOH) Interventions    Readmission Risk Interventions     No data to display

## 2022-10-01 NOTE — Progress Notes (Signed)
Physical Therapy Treatment Patient Details Name: Nathaniel [name] MRN: [id number] DOB: December 16, 1934 Today's Date: 10/01/2022   History of Present Illness Pt is a 87 yr old male who presented 11/24 due to a fall at home while using a leaf blower and holding onto his walker with one hand. CT of pelvis showed minimal displaced perihardware fx  and CT of head showed hematoma of head without fx. During hospitalization pt with increased confusion. 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 PMH: hypertension, GERD, rheumatoid arthritis, chronic pain, glaucoma with left eye blindness, chronic combined CHF, history of GI bleed, Rt THA x3, TKA.    PT Comments    Pt seen for PT tx with co-tx with OT for pt & therapists safety. Pt is only oriented to self & demonstrates decreased ability to follow simple commands during session. Pt requires max assist for bed mobility & max assist +2 for step pivot transfers & attempting to take a few steps forward with RW. Pt demonstrates significant trunk flexion & L lateral lean in standing, decreased ability to weight shift to RLE/hip in sitting or standing which greatly impacts transfers & gait. Continue to recommend STR upon d/c to maximize independence with functional mobility, decrease caregiver burden, & reduce fall risk prior to return home.     Recommendations for follow up therapy are one component of a multi-disciplinary discharge planning process, led by the attending physician.  Recommendations may be updated based on patient status, additional functional criteria and insurance authorization.  Follow Up Recommendations  Skilled nursing-short term rehab (<3 hours/day) Can patient physically be transported by private vehicle: No   Assistance Recommended at Discharge Frequent or constant Supervision/Assistance  Patient can return home with the following Two people to help  with walking and/or transfers;Two people to help with bathing/dressing/bathroom;Direct supervision/assist for medications management;Assist for transportation;Help with stairs or ramp for entrance   Equipment Recommendations  None recommended by PT (TBD in next venue)    Recommendations for Other Services       Precautions / Restrictions Precautions Precautions: Fall Restrictions Weight Bearing Restrictions: Yes RLE Weight Bearing: Weight bearing as tolerated     Mobility  Bed Mobility Overal bed mobility: Needs Assistance Bed Mobility: Supine to Sit     Supine to sit: Max assist, HOB elevated     General bed mobility comments: assistance to move BLE off EOB then upright trunk, cuing for UE placement to assist with uprighting self    Transfers   Equipment used: Rolling walker (2 wheels) Transfers: Sit to/from Stand, Bed to chair/wheelchair/BSC Sit to Stand: +2 physical assistance, Mod assist (max cuing re: safe hand placement with poor return demo, cuing/assistance to scoot out to edge of seat & for BLE placement)   Step pivot transfers: Max assist, +2 physical assistance       General transfer comment: Pt with flexed posture & left lateral lean, decreased weight shift to RLE that impacts stepping with LLE. Poor eccentric control during stand>sit.    Ambulation/Gait Ambulation/Gait assistance: Max assist, +2 physical assistance, +2 safety/equipment Gait Distance (Feet): 2 Feet Assistive device: Rolling walker (2 wheels) Gait Pattern/deviations: Decreased step length - right, Decreased step length - left, Decreased dorsiflexion - left, Decreased dorsiflexion - right, Decreased stride length, Decreased weight shift to right, Trunk flexed Gait velocity: decreased     General Gait Details: Chair follow for safety. Forward trunk lean on RW with  L lateral lean & decreased ability to weight shift/weight bear through RLE. Poor ability to step with either BLE. Ultimately  requires assistance to safelly sit in recliner.   Stairs             Wheelchair Mobility    Modified Rankin (Stroke Patients Only)       Balance Overall balance assessment: Needs assistance Sitting-balance support: Feet supported, Bilateral upper extremity supported Sitting balance-Leahy Scale: Poor Sitting balance - Comments: CGA static sitting EOB once therapists assisted pt with midline sitting vs posterior lean Postural control: Posterior lean Standing balance support: Bilateral upper extremity supported, Reliant on assistive device for balance Standing balance-Leahy Scale: Zero Standing balance comment: L lateral lean                            Cognition Arousal/Alertness: Awake/alert Behavior During Therapy: WFL for tasks assessed/performed Overall Cognitive Status: Impaired/Different from baseline Area of Impairment: Orientation, Attention, Memory, Following commands, Safety/judgement, Awareness, Problem solving                 Orientation Level: Disoriented to, Time, Place, Situation   Memory: Decreased recall of precautions, Decreased short-term memory Following Commands: Follows one step commands inconsistently, Follows one step commands with increased time Safety/Judgement: Decreased awareness of safety, Decreased awareness of deficits Awareness: Intellectual Problem Solving: Difficulty sequencing, Slow processing, Requires verbal cues, Requires tactile cues, Decreased initiation General Comments: PT orients pt on location & situation but pt unable to recall location at end of session, unable to select choice when give 2 re: location. Follows simple commands inconsistently.        Exercises      General Comments        Pertinent Vitals/Pain Pain Assessment Pain Location: R hip with movement Pain Descriptors / Indicators: Grimacing, Guarding, Discomfort Pain Intervention(s): Monitored during session, Repositioned    Home Living                           Prior Function            PT Goals (current goals can now be found in the care plan section) Acute Rehab PT Goals Patient Stated Goal: decreased pain PT Goal Formulation: With patient Time For Goal Achievement: 10/11/22 Potential to Achieve Goals: Fair Progress towards PT goals: Progressing toward goals    Frequency    Min 3X/week      PT Plan Current plan remains appropriate    Co-evaluation PT/OT/SLP Co-Evaluation/Treatment: Yes Reason for Co-Treatment: Complexity of the patient's impairments (multi-system involvement);Necessary to address cognition/behavior during functional activity;For patient/therapist safety PT goals addressed during session: Mobility/safety with mobility;Balance;Proper use of DME        AM-PAC PT "6 Clicks" Mobility   Outcome Measure  Help needed turning from your back to your side while in a flat bed without using bedrails?: Total Help needed moving from lying on your back to sitting on the side of a flat bed without using bedrails?: Total Help needed moving to and from a bed to a chair (including a wheelchair)?: Total Help needed standing up from a chair using your arms (e.g., wheelchair or bedside chair)?: Total Help needed to walk in hospital room?: Total Help needed climbing 3-5 steps with a railing? : Total 6 Click Score: 6    End of Session Equipment Utilized During Treatment: Gait belt Activity Tolerance: Patient tolerated treatment well;Patient limited by pain  Patient left: in chair;with chair alarm set;with call bell/phone within reach Nurse Communication: Mobility status PT Visit Diagnosis: Unsteadiness on feet (R26.81);Other abnormalities of gait and mobility (R26.89);History of falling (Z91.81);Muscle weakness (generalized) (M62.81);Difficulty in walking, not elsewhere classified (R26.2);Pain Pain - Right/Left: Right Pain - part of body: Hip     Time: 4599-7741 PT Time Calculation (min)  (ACUTE ONLY): 23 min  Charges:  $Therapeutic Activity: 8-22 mins                     Lavone Nian, PT, DPT 10/01/22, 1:52 PM    Waunita Schooner 10/01/2022, 1:50 PM

## 2022-10-01 NOTE — Care Management Important Message (Signed)
Important Message  Patient Details  Name: Nathaniel [name] MRN: [id number] Date of Birth: 10-01-1935   Medicare Important Message Given:  Yes     Hannah Beat 10/01/2022, 8:04 AM

## 2022-10-01 NOTE — Plan of Care (Signed)
  Problem: Education: Goal: Knowledge of General Education information will improve Description: Including pain rating scale, medication(s)/side effects and non-pharmacologic comfort measures Outcome: Adequate for Discharge   Problem: Health Behavior/Discharge Planning: Goal: Ability to manage health-related needs will improve Outcome: Adequate for Discharge   Problem: Clinical Measurements: Goal: Ability to maintain clinical measurements within normal limits will improve Outcome: Adequate for Discharge Goal: Will remain free from infection Outcome: Adequate for Discharge Goal: Diagnostic test results will improve Outcome: Adequate for Discharge Goal: Respiratory complications will improve Outcome: Adequate for Discharge Goal: Cardiovascular complication will be avoided Outcome: Adequate for Discharge   Problem: Activity: Goal: Risk for activity intolerance will decrease Outcome: Adequate for Discharge   Problem: Nutrition: Goal: Adequate nutrition will be maintained Outcome: Adequate for Discharge   Problem: Coping: Goal: Level of anxiety will decrease Outcome: Adequate for Discharge   Problem: Elimination: Goal: Will not experience complications related to bowel motility Outcome: Adequate for Discharge Goal: Will not experience complications related to urinary retention Outcome: Adequate for Discharge   Problem: Pain Managment: Goal: General experience of comfort will improve Outcome: Adequate for Discharge   Problem: Safety: Goal: Ability to remain free from injury will improve Outcome: Adequate for Discharge   Problem: Skin Integrity: Goal: Risk for impaired skin integrity will decrease Outcome: Adequate for Discharge   Problem: Acute Rehab OT Goals (only OT should resolve) Goal: Pt. Will Perform Upper Body Bathing Outcome: Adequate for Discharge Goal: Pt. Will Perform Lower Body Bathing Outcome: Adequate for Discharge Goal: Pt. Will Transfer To  Toilet Outcome: Adequate for Discharge   Problem: Acute Rehab PT Goals(only PT should resolve) Goal: Pt Will Go Supine/Side To Sit Outcome: Adequate for Discharge Goal: Patient Will Transfer Sit To/From Stand Outcome: Adequate for Discharge Goal: Pt Will Ambulate Outcome: Adequate for Discharge

## 2022-10-01 NOTE — Progress Notes (Signed)
Report given to AshleyLPN  at [facility], All questions and concerns were fully addressed.

## 2022-10-01 NOTE — TOC Transition Note (Signed)
Transition of Care [facility]) - CM/SW Discharge Note   Patient Details  Name: Nathaniel [name] MRN: [id number] Date of Birth: 04-16-1935  [facility] CM/SW Contact:  Joanne Chars, LCSW Phone Number: 10/01/2022, 2:35 PM   Clinical Narrative:   Pt discharging to [place], room 805A.  RN call [phone] for report.    Final next level of care: Skilled Nursing Facility Barriers to Discharge: Barriers Resolved   Patient Goals and CMS Choice Patient states their goals for this hospitalization and ongoing recovery are:: return home with wife after rehab CMS Medicare.gov Compare Post Acute Care list provided to:: Patient Choice offered to / list presented to : Spouse (wife Blanch Media)  Discharge Placement              Patient chooses bed at:  [facility]) Patient to be transferred to facility by: [place] Name of family member notified: wife Blanch Media in room Patient and family notified of of transfer: 10/01/22  Discharge Plan and Services In-house Referral: Clinical Social Work Discharge Planning Services: CM Consult Post Acute Care Choice: [place]                               Social Determinants of Health (SDOH) Interventions     Readmission Risk Interventions     No data to display

## 2022-10-02 ENCOUNTER — Other Ambulatory Visit: Payer: Self-pay | Admitting: *Deleted

## 2022-10-02 DIAGNOSIS — D7589 Other specified diseases of blood and blood-forming organs: Secondary | ICD-10-CM | POA: Diagnosis not present

## 2022-10-02 DIAGNOSIS — G9341 Metabolic encephalopathy: Secondary | ICD-10-CM | POA: Diagnosis not present

## 2022-10-02 DIAGNOSIS — M9701XD Periprosthetic fracture around internal prosthetic right hip joint, subsequent encounter: Secondary | ICD-10-CM | POA: Diagnosis not present

## 2022-10-02 DIAGNOSIS — M069 Rheumatoid arthritis, unspecified: Secondary | ICD-10-CM | POA: Diagnosis not present

## 2022-10-02 DIAGNOSIS — I110 Hypertensive heart disease with heart failure: Secondary | ICD-10-CM | POA: Diagnosis not present

## 2022-10-02 DIAGNOSIS — R911 Solitary pulmonary nodule: Secondary | ICD-10-CM | POA: Diagnosis not present

## 2022-10-02 DIAGNOSIS — R3129 Other microscopic hematuria: Secondary | ICD-10-CM | POA: Diagnosis not present

## 2022-10-02 DIAGNOSIS — D849 Immunodeficiency, unspecified: Secondary | ICD-10-CM | POA: Diagnosis not present

## 2022-10-02 DIAGNOSIS — S0083XA Contusion of other part of head, initial encounter: Secondary | ICD-10-CM | POA: Diagnosis not present

## 2022-10-02 DIAGNOSIS — K219 Gastro-esophageal reflux disease without esophagitis: Secondary | ICD-10-CM | POA: Diagnosis not present

## 2022-10-02 DIAGNOSIS — I739 Peripheral vascular disease, unspecified: Secondary | ICD-10-CM | POA: Diagnosis not present

## 2022-10-02 DIAGNOSIS — I5021 Acute systolic (congestive) heart failure: Secondary | ICD-10-CM | POA: Diagnosis not present

## 2022-10-02 LAB — HEMOGLOBIN A1C
Hgb A1c MFr Bld: 5 % (ref 4.8–5.6)
Mean Plasma Glucose: 97 mg/dL

## 2022-10-02 NOTE — Patient Outreach (Signed)
Per [name] recently admitted to [facility] Will follow for transition plans and potential care coordination services as benefit of insurance plan and PCP.   PC[facility] office [facility] care management.   Will continue to follow while Nathaniel [name] resides in SNF.   Marthenia Rolling, MSN, RN,BSN [place] Acute Care Coordinator [phone] (Direct dial)

## 2022-10-03 DIAGNOSIS — I5040 Unspecified combined systolic (congestive) and diastolic (congestive) heart failure: Secondary | ICD-10-CM | POA: Diagnosis not present

## 2022-10-03 DIAGNOSIS — Z7982 Long term (current) use of aspirin: Secondary | ICD-10-CM | POA: Diagnosis not present

## 2022-10-03 DIAGNOSIS — Z7902 Long term (current) use of antithrombotics/antiplatelets: Secondary | ICD-10-CM | POA: Diagnosis not present

## 2022-10-03 DIAGNOSIS — R3129 Other microscopic hematuria: Secondary | ICD-10-CM | POA: Diagnosis not present

## 2022-10-03 DIAGNOSIS — M9701XD Periprosthetic fracture around internal prosthetic right hip joint, subsequent encounter: Secondary | ICD-10-CM | POA: Diagnosis not present

## 2022-10-03 DIAGNOSIS — G9341 Metabolic encephalopathy: Secondary | ICD-10-CM | POA: Diagnosis not present

## 2022-10-03 DIAGNOSIS — Z7189 Other specified counseling: Secondary | ICD-10-CM | POA: Diagnosis not present

## 2022-10-03 DIAGNOSIS — Z8673 Personal history of transient ischemic attack (TIA), and cerebral infarction without residual deficits: Secondary | ICD-10-CM | POA: Diagnosis not present

## 2022-10-03 LAB — VITAMIN B1: Vitamin B1 (Thiamine): 158.7 nmol/L (ref 66.5–200.0)

## 2022-10-06 DIAGNOSIS — G894 Chronic pain syndrome: Secondary | ICD-10-CM | POA: Diagnosis not present

## 2022-10-06 DIAGNOSIS — M9701XD Periprosthetic fracture around internal prosthetic right hip joint, subsequent encounter: Secondary | ICD-10-CM | POA: Diagnosis not present

## 2022-10-06 DIAGNOSIS — M6281 Muscle weakness (generalized): Secondary | ICD-10-CM | POA: Diagnosis not present

## 2022-10-06 DIAGNOSIS — M6250 Muscle wasting and atrophy, not elsewhere classified, unspecified site: Secondary | ICD-10-CM | POA: Diagnosis not present

## 2022-10-06 DIAGNOSIS — I6789 Other cerebrovascular disease: Secondary | ICD-10-CM | POA: Diagnosis not present

## 2022-10-06 DIAGNOSIS — Z9181 History of falling: Secondary | ICD-10-CM | POA: Diagnosis not present

## 2022-10-06 DIAGNOSIS — R52 Pain, unspecified: Secondary | ICD-10-CM | POA: Diagnosis not present

## 2022-10-06 DIAGNOSIS — R5381 Other malaise: Secondary | ICD-10-CM | POA: Diagnosis not present

## 2022-10-06 DIAGNOSIS — M9701XA Periprosthetic fracture around internal prosthetic right hip joint, initial encounter: Secondary | ICD-10-CM | POA: Diagnosis not present

## 2022-10-06 DIAGNOSIS — M069 Rheumatoid arthritis, unspecified: Secondary | ICD-10-CM | POA: Diagnosis not present

## 2022-10-06 DIAGNOSIS — G9341 Metabolic encephalopathy: Secondary | ICD-10-CM | POA: Diagnosis not present

## 2022-10-08 DIAGNOSIS — G894 Chronic pain syndrome: Secondary | ICD-10-CM | POA: Diagnosis not present

## 2022-10-08 DIAGNOSIS — M069 Rheumatoid arthritis, unspecified: Secondary | ICD-10-CM | POA: Diagnosis not present

## 2022-10-08 DIAGNOSIS — R5381 Other malaise: Secondary | ICD-10-CM | POA: Diagnosis not present

## 2022-10-08 DIAGNOSIS — Z9181 History of falling: Secondary | ICD-10-CM | POA: Diagnosis not present

## 2022-10-08 DIAGNOSIS — M6250 Muscle wasting and atrophy, not elsewhere classified, unspecified site: Secondary | ICD-10-CM | POA: Diagnosis not present

## 2022-10-08 DIAGNOSIS — I6789 Other cerebrovascular disease: Secondary | ICD-10-CM | POA: Diagnosis not present

## 2022-10-08 DIAGNOSIS — G9341 Metabolic encephalopathy: Secondary | ICD-10-CM | POA: Diagnosis not present

## 2022-10-08 DIAGNOSIS — R52 Pain, unspecified: Secondary | ICD-10-CM | POA: Diagnosis not present

## 2022-10-08 DIAGNOSIS — M9701XA Periprosthetic fracture around internal prosthetic right hip joint, initial encounter: Secondary | ICD-10-CM | POA: Diagnosis not present

## 2022-10-08 DIAGNOSIS — M9701XD Periprosthetic fracture around internal prosthetic right hip joint, subsequent encounter: Secondary | ICD-10-CM | POA: Diagnosis not present

## 2022-10-08 DIAGNOSIS — M6281 Muscle weakness (generalized): Secondary | ICD-10-CM | POA: Diagnosis not present

## 2022-10-13 DIAGNOSIS — G894 Chronic pain syndrome: Secondary | ICD-10-CM | POA: Diagnosis not present

## 2022-10-13 DIAGNOSIS — Z9181 History of falling: Secondary | ICD-10-CM | POA: Diagnosis not present

## 2022-10-13 DIAGNOSIS — M9701XD Periprosthetic fracture around internal prosthetic right hip joint, subsequent encounter: Secondary | ICD-10-CM | POA: Diagnosis not present

## 2022-10-13 DIAGNOSIS — G9341 Metabolic encephalopathy: Secondary | ICD-10-CM | POA: Diagnosis not present

## 2022-10-13 DIAGNOSIS — M6281 Muscle weakness (generalized): Secondary | ICD-10-CM | POA: Diagnosis not present

## 2022-10-13 DIAGNOSIS — R5381 Other malaise: Secondary | ICD-10-CM | POA: Diagnosis not present

## 2022-10-13 DIAGNOSIS — M9701XA Periprosthetic fracture around internal prosthetic right hip joint, initial encounter: Secondary | ICD-10-CM | POA: Diagnosis not present

## 2022-10-13 DIAGNOSIS — R52 Pain, unspecified: Secondary | ICD-10-CM | POA: Diagnosis not present

## 2022-10-13 DIAGNOSIS — I6789 Other cerebrovascular disease: Secondary | ICD-10-CM | POA: Diagnosis not present

## 2022-10-13 DIAGNOSIS — M069 Rheumatoid arthritis, unspecified: Secondary | ICD-10-CM | POA: Diagnosis not present

## 2022-10-13 DIAGNOSIS — M6250 Muscle wasting and atrophy, not elsewhere classified, unspecified site: Secondary | ICD-10-CM | POA: Diagnosis not present

## 2022-10-15 DIAGNOSIS — M9701XA Periprosthetic fracture around internal prosthetic right hip joint, initial encounter: Secondary | ICD-10-CM | POA: Diagnosis not present

## 2022-10-15 DIAGNOSIS — I739 Peripheral vascular disease, unspecified: Secondary | ICD-10-CM | POA: Diagnosis not present

## 2022-10-15 DIAGNOSIS — I5040 Unspecified combined systolic (congestive) and diastolic (congestive) heart failure: Secondary | ICD-10-CM | POA: Diagnosis not present

## 2022-10-15 DIAGNOSIS — M6250 Muscle wasting and atrophy, not elsewhere classified, unspecified site: Secondary | ICD-10-CM | POA: Diagnosis not present

## 2022-10-15 DIAGNOSIS — I110 Hypertensive heart disease with heart failure: Secondary | ICD-10-CM | POA: Diagnosis not present

## 2022-10-15 DIAGNOSIS — R911 Solitary pulmonary nodule: Secondary | ICD-10-CM | POA: Diagnosis not present

## 2022-10-15 DIAGNOSIS — R5381 Other malaise: Secondary | ICD-10-CM | POA: Diagnosis not present

## 2022-10-15 DIAGNOSIS — Z7982 Long term (current) use of aspirin: Secondary | ICD-10-CM | POA: Diagnosis not present

## 2022-10-15 DIAGNOSIS — I6789 Other cerebrovascular disease: Secondary | ICD-10-CM | POA: Diagnosis not present

## 2022-10-15 DIAGNOSIS — M9701XD Periprosthetic fracture around internal prosthetic right hip joint, subsequent encounter: Secondary | ICD-10-CM | POA: Diagnosis not present

## 2022-10-15 DIAGNOSIS — M6281 Muscle weakness (generalized): Secondary | ICD-10-CM | POA: Diagnosis not present

## 2022-10-15 DIAGNOSIS — L308 Other specified dermatitis: Secondary | ICD-10-CM | POA: Diagnosis not present

## 2022-10-15 DIAGNOSIS — R3129 Other microscopic hematuria: Secondary | ICD-10-CM | POA: Diagnosis not present

## 2022-10-15 DIAGNOSIS — M069 Rheumatoid arthritis, unspecified: Secondary | ICD-10-CM | POA: Diagnosis not present

## 2022-10-15 DIAGNOSIS — G894 Chronic pain syndrome: Secondary | ICD-10-CM | POA: Diagnosis not present

## 2022-10-15 DIAGNOSIS — R52 Pain, unspecified: Secondary | ICD-10-CM | POA: Diagnosis not present

## 2022-10-15 DIAGNOSIS — Z7902 Long term (current) use of antithrombotics/antiplatelets: Secondary | ICD-10-CM | POA: Diagnosis not present

## 2022-10-15 DIAGNOSIS — Z9181 History of falling: Secondary | ICD-10-CM | POA: Diagnosis not present

## 2022-10-15 DIAGNOSIS — G9341 Metabolic encephalopathy: Secondary | ICD-10-CM | POA: Diagnosis not present

## 2022-10-15 DIAGNOSIS — Z8673 Personal history of transient ischemic attack (TIA), and cerebral infarction without residual deficits: Secondary | ICD-10-CM | POA: Diagnosis not present

## 2022-10-16 ENCOUNTER — Other Ambulatory Visit: Payer: Self-pay | Admitting: *Deleted

## 2022-10-16 NOTE — Patient Outreach (Signed)
[place] Coordinator follow up. Mr. Dyke resides in [facility]. PCP office [facility].   Spoke with [facility] SN[facility] social workers and Civil engineer, contracting. Mr. Folz will transition home on Sunday, 10/19/22 with spouse. He will have [place] home health services arranged.   Will send notification to [facility] care management.    Marthenia Rolling, MSN, RN,BSN [place] Acute Care Coordinator [phone] (Direct dial)

## 2022-10-17 DIAGNOSIS — D6869 Other thrombophilia: Secondary | ICD-10-CM | POA: Diagnosis not present

## 2022-10-17 DIAGNOSIS — S7002XA Contusion of left hip, initial encounter: Secondary | ICD-10-CM | POA: Diagnosis not present

## 2022-10-21 DIAGNOSIS — S72141D Displaced intertrochanteric fracture of right femur, subsequent encounter for closed fracture with routine healing: Secondary | ICD-10-CM | POA: Diagnosis not present

## 2022-10-21 DIAGNOSIS — R911 Solitary pulmonary nodule: Secondary | ICD-10-CM | POA: Diagnosis not present

## 2022-10-21 DIAGNOSIS — K219 Gastro-esophageal reflux disease without esophagitis: Secondary | ICD-10-CM | POA: Diagnosis not present

## 2022-10-21 DIAGNOSIS — M1990 Unspecified osteoarthritis, unspecified site: Secondary | ICD-10-CM | POA: Diagnosis not present

## 2022-10-21 DIAGNOSIS — R3129 Other microscopic hematuria: Secondary | ICD-10-CM | POA: Diagnosis not present

## 2022-10-21 DIAGNOSIS — S32039D Unspecified fracture of third lumbar vertebra, subsequent encounter for fracture with routine healing: Secondary | ICD-10-CM | POA: Diagnosis not present

## 2022-10-21 DIAGNOSIS — E7800 Pure hypercholesterolemia, unspecified: Secondary | ICD-10-CM | POA: Diagnosis not present

## 2022-10-21 DIAGNOSIS — M05841 Other rheumatoid arthritis with rheumatoid factor of right hand: Secondary | ICD-10-CM | POA: Diagnosis not present

## 2022-10-21 DIAGNOSIS — Z8673 Personal history of transient ischemic attack (TIA), and cerebral infarction without residual deficits: Secondary | ICD-10-CM | POA: Diagnosis not present

## 2022-10-21 DIAGNOSIS — I110 Hypertensive heart disease with heart failure: Secondary | ICD-10-CM | POA: Diagnosis not present

## 2022-10-21 DIAGNOSIS — Z7902 Long term (current) use of antithrombotics/antiplatelets: Secondary | ICD-10-CM | POA: Diagnosis not present

## 2022-10-21 DIAGNOSIS — G4700 Insomnia, unspecified: Secondary | ICD-10-CM | POA: Diagnosis not present

## 2022-10-21 DIAGNOSIS — G9341 Metabolic encephalopathy: Secondary | ICD-10-CM | POA: Diagnosis not present

## 2022-10-21 DIAGNOSIS — E611 Iron deficiency: Secondary | ICD-10-CM | POA: Diagnosis not present

## 2022-10-21 DIAGNOSIS — M9701XD Periprosthetic fracture around internal prosthetic right hip joint, subsequent encounter: Secondary | ICD-10-CM | POA: Diagnosis not present

## 2022-10-21 DIAGNOSIS — Z9181 History of falling: Secondary | ICD-10-CM | POA: Diagnosis not present

## 2022-10-21 DIAGNOSIS — S0083XD Contusion of other part of head, subsequent encounter: Secondary | ICD-10-CM | POA: Diagnosis not present

## 2022-10-21 DIAGNOSIS — Z7952 Long term (current) use of systemic steroids: Secondary | ICD-10-CM | POA: Diagnosis not present

## 2022-10-21 DIAGNOSIS — G8929 Other chronic pain: Secondary | ICD-10-CM | POA: Diagnosis not present

## 2022-10-21 DIAGNOSIS — M069 Rheumatoid arthritis, unspecified: Secondary | ICD-10-CM | POA: Diagnosis not present

## 2022-10-21 DIAGNOSIS — I5042 Chronic combined systolic (congestive) and diastolic (congestive) heart failure: Secondary | ICD-10-CM | POA: Diagnosis not present

## 2022-10-21 DIAGNOSIS — I739 Peripheral vascular disease, unspecified: Secondary | ICD-10-CM | POA: Diagnosis not present

## 2022-10-21 DIAGNOSIS — E876 Hypokalemia: Secondary | ICD-10-CM | POA: Diagnosis not present

## 2022-10-22 ENCOUNTER — Other Ambulatory Visit: Payer: Self-pay | Admitting: *Deleted

## 2022-10-22 NOTE — Patient Outreach (Signed)
[facility] Post- Acute Care Coordinator follow up. Verified in [place] Mr. [place] discharged from [facility] on 10/18/22. Has [facility] home [facility].   Secure notification of SNF discharge sent to [facility] care management.   Marthenia Rolling, MSN, RN,BSN [place] Acute Care Coordinator [phone] (Direct dial)

## 2022-10-23 DIAGNOSIS — Z7902 Long term (current) use of antithrombotics/antiplatelets: Secondary | ICD-10-CM | POA: Diagnosis not present

## 2022-10-23 DIAGNOSIS — S72141D Displaced intertrochanteric fracture of right femur, subsequent encounter for closed fracture with routine healing: Secondary | ICD-10-CM | POA: Diagnosis not present

## 2022-10-23 DIAGNOSIS — K219 Gastro-esophageal reflux disease without esophagitis: Secondary | ICD-10-CM | POA: Diagnosis not present

## 2022-10-23 DIAGNOSIS — E876 Hypokalemia: Secondary | ICD-10-CM | POA: Diagnosis not present

## 2022-10-23 DIAGNOSIS — G9341 Metabolic encephalopathy: Secondary | ICD-10-CM | POA: Diagnosis not present

## 2022-10-23 DIAGNOSIS — Z7952 Long term (current) use of systemic steroids: Secondary | ICD-10-CM | POA: Diagnosis not present

## 2022-10-23 DIAGNOSIS — Z9181 History of falling: Secondary | ICD-10-CM | POA: Diagnosis not present

## 2022-10-23 DIAGNOSIS — I5042 Chronic combined systolic (congestive) and diastolic (congestive) heart failure: Secondary | ICD-10-CM | POA: Diagnosis not present

## 2022-10-23 DIAGNOSIS — M9701XD Periprosthetic fracture around internal prosthetic right hip joint, subsequent encounter: Secondary | ICD-10-CM | POA: Diagnosis not present

## 2022-10-23 DIAGNOSIS — G8929 Other chronic pain: Secondary | ICD-10-CM | POA: Diagnosis not present

## 2022-10-23 DIAGNOSIS — R911 Solitary pulmonary nodule: Secondary | ICD-10-CM | POA: Diagnosis not present

## 2022-10-23 DIAGNOSIS — I739 Peripheral vascular disease, unspecified: Secondary | ICD-10-CM | POA: Diagnosis not present

## 2022-10-23 DIAGNOSIS — M069 Rheumatoid arthritis, unspecified: Secondary | ICD-10-CM | POA: Diagnosis not present

## 2022-10-23 DIAGNOSIS — S32039D Unspecified fracture of third lumbar vertebra, subsequent encounter for fracture with routine healing: Secondary | ICD-10-CM | POA: Diagnosis not present

## 2022-10-23 DIAGNOSIS — M1990 Unspecified osteoarthritis, unspecified site: Secondary | ICD-10-CM | POA: Diagnosis not present

## 2022-10-23 DIAGNOSIS — I110 Hypertensive heart disease with heart failure: Secondary | ICD-10-CM | POA: Diagnosis not present

## 2022-10-23 DIAGNOSIS — S0083XD Contusion of other part of head, subsequent encounter: Secondary | ICD-10-CM | POA: Diagnosis not present

## 2022-10-24 DIAGNOSIS — M1990 Unspecified osteoarthritis, unspecified site: Secondary | ICD-10-CM | POA: Diagnosis not present

## 2022-10-24 DIAGNOSIS — G8929 Other chronic pain: Secondary | ICD-10-CM | POA: Diagnosis not present

## 2022-10-24 DIAGNOSIS — E876 Hypokalemia: Secondary | ICD-10-CM | POA: Diagnosis not present

## 2022-10-24 DIAGNOSIS — I739 Peripheral vascular disease, unspecified: Secondary | ICD-10-CM | POA: Diagnosis not present

## 2022-10-24 DIAGNOSIS — K219 Gastro-esophageal reflux disease without esophagitis: Secondary | ICD-10-CM | POA: Diagnosis not present

## 2022-10-24 DIAGNOSIS — S0083XD Contusion of other part of head, subsequent encounter: Secondary | ICD-10-CM | POA: Diagnosis not present

## 2022-10-24 DIAGNOSIS — S72141D Displaced intertrochanteric fracture of right femur, subsequent encounter for closed fracture with routine healing: Secondary | ICD-10-CM | POA: Diagnosis not present

## 2022-10-24 DIAGNOSIS — M9701XD Periprosthetic fracture around internal prosthetic right hip joint, subsequent encounter: Secondary | ICD-10-CM | POA: Diagnosis not present

## 2022-10-24 DIAGNOSIS — I110 Hypertensive heart disease with heart failure: Secondary | ICD-10-CM | POA: Diagnosis not present

## 2022-10-24 DIAGNOSIS — R911 Solitary pulmonary nodule: Secondary | ICD-10-CM | POA: Diagnosis not present

## 2022-10-24 DIAGNOSIS — Z9181 History of falling: Secondary | ICD-10-CM | POA: Diagnosis not present

## 2022-10-24 DIAGNOSIS — M069 Rheumatoid arthritis, unspecified: Secondary | ICD-10-CM | POA: Diagnosis not present

## 2022-10-24 DIAGNOSIS — S32039D Unspecified fracture of third lumbar vertebra, subsequent encounter for fracture with routine healing: Secondary | ICD-10-CM | POA: Diagnosis not present

## 2022-10-24 DIAGNOSIS — Z7902 Long term (current) use of antithrombotics/antiplatelets: Secondary | ICD-10-CM | POA: Diagnosis not present

## 2022-10-24 DIAGNOSIS — I5042 Chronic combined systolic (congestive) and diastolic (congestive) heart failure: Secondary | ICD-10-CM | POA: Diagnosis not present

## 2022-10-24 DIAGNOSIS — G9341 Metabolic encephalopathy: Secondary | ICD-10-CM | POA: Diagnosis not present

## 2022-10-24 DIAGNOSIS — Z7952 Long term (current) use of systemic steroids: Secondary | ICD-10-CM | POA: Diagnosis not present

## 2022-10-28 DIAGNOSIS — G8929 Other chronic pain: Secondary | ICD-10-CM | POA: Diagnosis not present

## 2022-10-28 DIAGNOSIS — Z7902 Long term (current) use of antithrombotics/antiplatelets: Secondary | ICD-10-CM | POA: Diagnosis not present

## 2022-10-28 DIAGNOSIS — M9701XD Periprosthetic fracture around internal prosthetic right hip joint, subsequent encounter: Secondary | ICD-10-CM | POA: Diagnosis not present

## 2022-10-28 DIAGNOSIS — I110 Hypertensive heart disease with heart failure: Secondary | ICD-10-CM | POA: Diagnosis not present

## 2022-10-28 DIAGNOSIS — Z7952 Long term (current) use of systemic steroids: Secondary | ICD-10-CM | POA: Diagnosis not present

## 2022-10-28 DIAGNOSIS — G9341 Metabolic encephalopathy: Secondary | ICD-10-CM | POA: Diagnosis not present

## 2022-10-28 DIAGNOSIS — M069 Rheumatoid arthritis, unspecified: Secondary | ICD-10-CM | POA: Diagnosis not present

## 2022-10-28 DIAGNOSIS — Z9181 History of falling: Secondary | ICD-10-CM | POA: Diagnosis not present

## 2022-10-28 DIAGNOSIS — R911 Solitary pulmonary nodule: Secondary | ICD-10-CM | POA: Diagnosis not present

## 2022-10-28 DIAGNOSIS — S0083XD Contusion of other part of head, subsequent encounter: Secondary | ICD-10-CM | POA: Diagnosis not present

## 2022-10-28 DIAGNOSIS — I739 Peripheral vascular disease, unspecified: Secondary | ICD-10-CM | POA: Diagnosis not present

## 2022-10-28 DIAGNOSIS — K219 Gastro-esophageal reflux disease without esophagitis: Secondary | ICD-10-CM | POA: Diagnosis not present

## 2022-10-28 DIAGNOSIS — I5042 Chronic combined systolic (congestive) and diastolic (congestive) heart failure: Secondary | ICD-10-CM | POA: Diagnosis not present

## 2022-10-28 DIAGNOSIS — E876 Hypokalemia: Secondary | ICD-10-CM | POA: Diagnosis not present

## 2022-10-28 DIAGNOSIS — S32039D Unspecified fracture of third lumbar vertebra, subsequent encounter for fracture with routine healing: Secondary | ICD-10-CM | POA: Diagnosis not present

## 2022-10-28 DIAGNOSIS — S72141D Displaced intertrochanteric fracture of right femur, subsequent encounter for closed fracture with routine healing: Secondary | ICD-10-CM | POA: Diagnosis not present

## 2022-10-28 DIAGNOSIS — M1990 Unspecified osteoarthritis, unspecified site: Secondary | ICD-10-CM | POA: Diagnosis not present

## 2022-10-29 DIAGNOSIS — M25551 Pain in right hip: Secondary | ICD-10-CM | POA: Diagnosis not present

## 2022-10-29 DIAGNOSIS — M25562 Pain in left knee: Secondary | ICD-10-CM | POA: Diagnosis not present

## 2022-10-30 DIAGNOSIS — H401112 Primary open-angle glaucoma, right eye, moderate stage: Secondary | ICD-10-CM | POA: Diagnosis not present

## 2022-10-30 DIAGNOSIS — I5042 Chronic combined systolic (congestive) and diastolic (congestive) heart failure: Secondary | ICD-10-CM | POA: Diagnosis not present

## 2022-10-30 DIAGNOSIS — I739 Peripheral vascular disease, unspecified: Secondary | ICD-10-CM | POA: Diagnosis not present

## 2022-11-04 DIAGNOSIS — I739 Peripheral vascular disease, unspecified: Secondary | ICD-10-CM | POA: Diagnosis not present

## 2022-11-04 DIAGNOSIS — G8929 Other chronic pain: Secondary | ICD-10-CM | POA: Diagnosis not present

## 2022-11-04 DIAGNOSIS — R911 Solitary pulmonary nodule: Secondary | ICD-10-CM | POA: Diagnosis not present

## 2022-11-04 DIAGNOSIS — Z9181 History of falling: Secondary | ICD-10-CM | POA: Diagnosis not present

## 2022-11-04 DIAGNOSIS — I110 Hypertensive heart disease with heart failure: Secondary | ICD-10-CM | POA: Diagnosis not present

## 2022-11-04 DIAGNOSIS — S0083XD Contusion of other part of head, subsequent encounter: Secondary | ICD-10-CM | POA: Diagnosis not present

## 2022-11-04 DIAGNOSIS — S32039D Unspecified fracture of third lumbar vertebra, subsequent encounter for fracture with routine healing: Secondary | ICD-10-CM | POA: Diagnosis not present

## 2022-11-04 DIAGNOSIS — I5042 Chronic combined systolic (congestive) and diastolic (congestive) heart failure: Secondary | ICD-10-CM | POA: Diagnosis not present

## 2022-11-04 DIAGNOSIS — K219 Gastro-esophageal reflux disease without esophagitis: Secondary | ICD-10-CM | POA: Diagnosis not present

## 2022-11-04 DIAGNOSIS — E876 Hypokalemia: Secondary | ICD-10-CM | POA: Diagnosis not present

## 2022-11-04 DIAGNOSIS — S72141D Displaced intertrochanteric fracture of right femur, subsequent encounter for closed fracture with routine healing: Secondary | ICD-10-CM | POA: Diagnosis not present

## 2022-11-04 DIAGNOSIS — M1990 Unspecified osteoarthritis, unspecified site: Secondary | ICD-10-CM | POA: Diagnosis not present

## 2022-11-04 DIAGNOSIS — M9701XD Periprosthetic fracture around internal prosthetic right hip joint, subsequent encounter: Secondary | ICD-10-CM | POA: Diagnosis not present

## 2022-11-04 DIAGNOSIS — M069 Rheumatoid arthritis, unspecified: Secondary | ICD-10-CM | POA: Diagnosis not present

## 2022-11-04 DIAGNOSIS — Z7902 Long term (current) use of antithrombotics/antiplatelets: Secondary | ICD-10-CM | POA: Diagnosis not present

## 2022-11-04 DIAGNOSIS — G9341 Metabolic encephalopathy: Secondary | ICD-10-CM | POA: Diagnosis not present

## 2022-11-04 DIAGNOSIS — Z7952 Long term (current) use of systemic steroids: Secondary | ICD-10-CM | POA: Diagnosis not present

## 2022-11-05 DIAGNOSIS — Z7902 Long term (current) use of antithrombotics/antiplatelets: Secondary | ICD-10-CM | POA: Diagnosis not present

## 2022-11-05 DIAGNOSIS — R911 Solitary pulmonary nodule: Secondary | ICD-10-CM | POA: Diagnosis not present

## 2022-11-05 DIAGNOSIS — Z9181 History of falling: Secondary | ICD-10-CM | POA: Diagnosis not present

## 2022-11-05 DIAGNOSIS — M9701XD Periprosthetic fracture around internal prosthetic right hip joint, subsequent encounter: Secondary | ICD-10-CM | POA: Diagnosis not present

## 2022-11-05 DIAGNOSIS — S72141D Displaced intertrochanteric fracture of right femur, subsequent encounter for closed fracture with routine healing: Secondary | ICD-10-CM | POA: Diagnosis not present

## 2022-11-05 DIAGNOSIS — M069 Rheumatoid arthritis, unspecified: Secondary | ICD-10-CM | POA: Diagnosis not present

## 2022-11-05 DIAGNOSIS — G9341 Metabolic encephalopathy: Secondary | ICD-10-CM | POA: Diagnosis not present

## 2022-11-05 DIAGNOSIS — I110 Hypertensive heart disease with heart failure: Secondary | ICD-10-CM | POA: Diagnosis not present

## 2022-11-05 DIAGNOSIS — Z7952 Long term (current) use of systemic steroids: Secondary | ICD-10-CM | POA: Diagnosis not present

## 2022-11-05 DIAGNOSIS — K219 Gastro-esophageal reflux disease without esophagitis: Secondary | ICD-10-CM | POA: Diagnosis not present

## 2022-11-05 DIAGNOSIS — G8929 Other chronic pain: Secondary | ICD-10-CM | POA: Diagnosis not present

## 2022-11-05 DIAGNOSIS — S32039D Unspecified fracture of third lumbar vertebra, subsequent encounter for fracture with routine healing: Secondary | ICD-10-CM | POA: Diagnosis not present

## 2022-11-05 DIAGNOSIS — E876 Hypokalemia: Secondary | ICD-10-CM | POA: Diagnosis not present

## 2022-11-05 DIAGNOSIS — I739 Peripheral vascular disease, unspecified: Secondary | ICD-10-CM | POA: Diagnosis not present

## 2022-11-05 DIAGNOSIS — I5042 Chronic combined systolic (congestive) and diastolic (congestive) heart failure: Secondary | ICD-10-CM | POA: Diagnosis not present

## 2022-11-05 DIAGNOSIS — S0083XD Contusion of other part of head, subsequent encounter: Secondary | ICD-10-CM | POA: Diagnosis not present

## 2022-11-05 DIAGNOSIS — M1990 Unspecified osteoarthritis, unspecified site: Secondary | ICD-10-CM | POA: Diagnosis not present

## 2022-11-06 DIAGNOSIS — G8929 Other chronic pain: Secondary | ICD-10-CM | POA: Diagnosis not present

## 2022-11-06 DIAGNOSIS — Z7902 Long term (current) use of antithrombotics/antiplatelets: Secondary | ICD-10-CM | POA: Diagnosis not present

## 2022-11-06 DIAGNOSIS — I110 Hypertensive heart disease with heart failure: Secondary | ICD-10-CM | POA: Diagnosis not present

## 2022-11-06 DIAGNOSIS — S32039D Unspecified fracture of third lumbar vertebra, subsequent encounter for fracture with routine healing: Secondary | ICD-10-CM | POA: Diagnosis not present

## 2022-11-06 DIAGNOSIS — M1990 Unspecified osteoarthritis, unspecified site: Secondary | ICD-10-CM | POA: Diagnosis not present

## 2022-11-06 DIAGNOSIS — G9341 Metabolic encephalopathy: Secondary | ICD-10-CM | POA: Diagnosis not present

## 2022-11-06 DIAGNOSIS — I5042 Chronic combined systolic (congestive) and diastolic (congestive) heart failure: Secondary | ICD-10-CM | POA: Diagnosis not present

## 2022-11-06 DIAGNOSIS — Z7952 Long term (current) use of systemic steroids: Secondary | ICD-10-CM | POA: Diagnosis not present

## 2022-11-06 DIAGNOSIS — S72141D Displaced intertrochanteric fracture of right femur, subsequent encounter for closed fracture with routine healing: Secondary | ICD-10-CM | POA: Diagnosis not present

## 2022-11-06 DIAGNOSIS — K219 Gastro-esophageal reflux disease without esophagitis: Secondary | ICD-10-CM | POA: Diagnosis not present

## 2022-11-06 DIAGNOSIS — R911 Solitary pulmonary nodule: Secondary | ICD-10-CM | POA: Diagnosis not present

## 2022-11-06 DIAGNOSIS — E876 Hypokalemia: Secondary | ICD-10-CM | POA: Diagnosis not present

## 2022-11-06 DIAGNOSIS — I739 Peripheral vascular disease, unspecified: Secondary | ICD-10-CM | POA: Diagnosis not present

## 2022-11-06 DIAGNOSIS — Z9181 History of falling: Secondary | ICD-10-CM | POA: Diagnosis not present

## 2022-11-06 DIAGNOSIS — M069 Rheumatoid arthritis, unspecified: Secondary | ICD-10-CM | POA: Diagnosis not present

## 2022-11-06 DIAGNOSIS — S0083XD Contusion of other part of head, subsequent encounter: Secondary | ICD-10-CM | POA: Diagnosis not present

## 2022-11-06 DIAGNOSIS — M9701XD Periprosthetic fracture around internal prosthetic right hip joint, subsequent encounter: Secondary | ICD-10-CM | POA: Diagnosis not present

## 2022-11-10 DIAGNOSIS — S32039D Unspecified fracture of third lumbar vertebra, subsequent encounter for fracture with routine healing: Secondary | ICD-10-CM | POA: Diagnosis not present

## 2022-11-10 DIAGNOSIS — I5042 Chronic combined systolic (congestive) and diastolic (congestive) heart failure: Secondary | ICD-10-CM | POA: Diagnosis not present

## 2022-11-10 DIAGNOSIS — S72141D Displaced intertrochanteric fracture of right femur, subsequent encounter for closed fracture with routine healing: Secondary | ICD-10-CM | POA: Diagnosis not present

## 2022-11-10 DIAGNOSIS — I739 Peripheral vascular disease, unspecified: Secondary | ICD-10-CM | POA: Diagnosis not present

## 2022-11-10 DIAGNOSIS — Z9181 History of falling: Secondary | ICD-10-CM | POA: Diagnosis not present

## 2022-11-10 DIAGNOSIS — M1990 Unspecified osteoarthritis, unspecified site: Secondary | ICD-10-CM | POA: Diagnosis not present

## 2022-11-10 DIAGNOSIS — G9341 Metabolic encephalopathy: Secondary | ICD-10-CM | POA: Diagnosis not present

## 2022-11-10 DIAGNOSIS — M069 Rheumatoid arthritis, unspecified: Secondary | ICD-10-CM | POA: Diagnosis not present

## 2022-11-10 DIAGNOSIS — E876 Hypokalemia: Secondary | ICD-10-CM | POA: Diagnosis not present

## 2022-11-10 DIAGNOSIS — R911 Solitary pulmonary nodule: Secondary | ICD-10-CM | POA: Diagnosis not present

## 2022-11-10 DIAGNOSIS — S0083XD Contusion of other part of head, subsequent encounter: Secondary | ICD-10-CM | POA: Diagnosis not present

## 2022-11-10 DIAGNOSIS — Z7902 Long term (current) use of antithrombotics/antiplatelets: Secondary | ICD-10-CM | POA: Diagnosis not present

## 2022-11-10 DIAGNOSIS — I110 Hypertensive heart disease with heart failure: Secondary | ICD-10-CM | POA: Diagnosis not present

## 2022-11-10 DIAGNOSIS — Z7952 Long term (current) use of systemic steroids: Secondary | ICD-10-CM | POA: Diagnosis not present

## 2022-11-10 DIAGNOSIS — K219 Gastro-esophageal reflux disease without esophagitis: Secondary | ICD-10-CM | POA: Diagnosis not present

## 2022-11-10 DIAGNOSIS — G8929 Other chronic pain: Secondary | ICD-10-CM | POA: Diagnosis not present

## 2022-11-10 DIAGNOSIS — M9701XD Periprosthetic fracture around internal prosthetic right hip joint, subsequent encounter: Secondary | ICD-10-CM | POA: Diagnosis not present

## 2022-11-11 DIAGNOSIS — K219 Gastro-esophageal reflux disease without esophagitis: Secondary | ICD-10-CM | POA: Diagnosis not present

## 2022-11-11 DIAGNOSIS — I5042 Chronic combined systolic (congestive) and diastolic (congestive) heart failure: Secondary | ICD-10-CM | POA: Diagnosis not present

## 2022-11-11 DIAGNOSIS — M069 Rheumatoid arthritis, unspecified: Secondary | ICD-10-CM | POA: Diagnosis not present

## 2022-11-11 DIAGNOSIS — Z7902 Long term (current) use of antithrombotics/antiplatelets: Secondary | ICD-10-CM | POA: Diagnosis not present

## 2022-11-11 DIAGNOSIS — M1990 Unspecified osteoarthritis, unspecified site: Secondary | ICD-10-CM | POA: Diagnosis not present

## 2022-11-11 DIAGNOSIS — G9341 Metabolic encephalopathy: Secondary | ICD-10-CM | POA: Diagnosis not present

## 2022-11-11 DIAGNOSIS — I110 Hypertensive heart disease with heart failure: Secondary | ICD-10-CM | POA: Diagnosis not present

## 2022-11-11 DIAGNOSIS — G8929 Other chronic pain: Secondary | ICD-10-CM | POA: Diagnosis not present

## 2022-11-11 DIAGNOSIS — S72141D Displaced intertrochanteric fracture of right femur, subsequent encounter for closed fracture with routine healing: Secondary | ICD-10-CM | POA: Diagnosis not present

## 2022-11-11 DIAGNOSIS — Z7952 Long term (current) use of systemic steroids: Secondary | ICD-10-CM | POA: Diagnosis not present

## 2022-11-11 DIAGNOSIS — R911 Solitary pulmonary nodule: Secondary | ICD-10-CM | POA: Diagnosis not present

## 2022-11-11 DIAGNOSIS — S32039D Unspecified fracture of third lumbar vertebra, subsequent encounter for fracture with routine healing: Secondary | ICD-10-CM | POA: Diagnosis not present

## 2022-11-11 DIAGNOSIS — S0083XD Contusion of other part of head, subsequent encounter: Secondary | ICD-10-CM | POA: Diagnosis not present

## 2022-11-11 DIAGNOSIS — E876 Hypokalemia: Secondary | ICD-10-CM | POA: Diagnosis not present

## 2022-11-11 DIAGNOSIS — M9701XD Periprosthetic fracture around internal prosthetic right hip joint, subsequent encounter: Secondary | ICD-10-CM | POA: Diagnosis not present

## 2022-11-11 DIAGNOSIS — Z9181 History of falling: Secondary | ICD-10-CM | POA: Diagnosis not present

## 2022-11-11 DIAGNOSIS — I739 Peripheral vascular disease, unspecified: Secondary | ICD-10-CM | POA: Diagnosis not present

## 2022-11-12 DIAGNOSIS — Z9181 History of falling: Secondary | ICD-10-CM | POA: Diagnosis not present

## 2022-11-12 DIAGNOSIS — I5042 Chronic combined systolic (congestive) and diastolic (congestive) heart failure: Secondary | ICD-10-CM | POA: Diagnosis not present

## 2022-11-12 DIAGNOSIS — I110 Hypertensive heart disease with heart failure: Secondary | ICD-10-CM | POA: Diagnosis not present

## 2022-11-12 DIAGNOSIS — Z7902 Long term (current) use of antithrombotics/antiplatelets: Secondary | ICD-10-CM | POA: Diagnosis not present

## 2022-11-12 DIAGNOSIS — M9701XD Periprosthetic fracture around internal prosthetic right hip joint, subsequent encounter: Secondary | ICD-10-CM | POA: Diagnosis not present

## 2022-11-12 DIAGNOSIS — Z7952 Long term (current) use of systemic steroids: Secondary | ICD-10-CM | POA: Diagnosis not present

## 2022-11-12 DIAGNOSIS — S0083XD Contusion of other part of head, subsequent encounter: Secondary | ICD-10-CM | POA: Diagnosis not present

## 2022-11-12 DIAGNOSIS — M1990 Unspecified osteoarthritis, unspecified site: Secondary | ICD-10-CM | POA: Diagnosis not present

## 2022-11-12 DIAGNOSIS — S72141D Displaced intertrochanteric fracture of right femur, subsequent encounter for closed fracture with routine healing: Secondary | ICD-10-CM | POA: Diagnosis not present

## 2022-11-12 DIAGNOSIS — K219 Gastro-esophageal reflux disease without esophagitis: Secondary | ICD-10-CM | POA: Diagnosis not present

## 2022-11-12 DIAGNOSIS — S32039D Unspecified fracture of third lumbar vertebra, subsequent encounter for fracture with routine healing: Secondary | ICD-10-CM | POA: Diagnosis not present

## 2022-11-12 DIAGNOSIS — I739 Peripheral vascular disease, unspecified: Secondary | ICD-10-CM | POA: Diagnosis not present

## 2022-11-12 DIAGNOSIS — G9341 Metabolic encephalopathy: Secondary | ICD-10-CM | POA: Diagnosis not present

## 2022-11-12 DIAGNOSIS — R911 Solitary pulmonary nodule: Secondary | ICD-10-CM | POA: Diagnosis not present

## 2022-11-12 DIAGNOSIS — E876 Hypokalemia: Secondary | ICD-10-CM | POA: Diagnosis not present

## 2022-11-12 DIAGNOSIS — M069 Rheumatoid arthritis, unspecified: Secondary | ICD-10-CM | POA: Diagnosis not present

## 2022-11-12 DIAGNOSIS — G8929 Other chronic pain: Secondary | ICD-10-CM | POA: Diagnosis not present

## 2022-11-13 DIAGNOSIS — Z9181 History of falling: Secondary | ICD-10-CM | POA: Diagnosis not present

## 2022-11-13 DIAGNOSIS — Z7902 Long term (current) use of antithrombotics/antiplatelets: Secondary | ICD-10-CM | POA: Diagnosis not present

## 2022-11-13 DIAGNOSIS — K219 Gastro-esophageal reflux disease without esophagitis: Secondary | ICD-10-CM | POA: Diagnosis not present

## 2022-11-13 DIAGNOSIS — Z7952 Long term (current) use of systemic steroids: Secondary | ICD-10-CM | POA: Diagnosis not present

## 2022-11-13 DIAGNOSIS — I739 Peripheral vascular disease, unspecified: Secondary | ICD-10-CM | POA: Diagnosis not present

## 2022-11-13 DIAGNOSIS — M069 Rheumatoid arthritis, unspecified: Secondary | ICD-10-CM | POA: Diagnosis not present

## 2022-11-13 DIAGNOSIS — S72141D Displaced intertrochanteric fracture of right femur, subsequent encounter for closed fracture with routine healing: Secondary | ICD-10-CM | POA: Diagnosis not present

## 2022-11-13 DIAGNOSIS — M1990 Unspecified osteoarthritis, unspecified site: Secondary | ICD-10-CM | POA: Diagnosis not present

## 2022-11-13 DIAGNOSIS — M9701XD Periprosthetic fracture around internal prosthetic right hip joint, subsequent encounter: Secondary | ICD-10-CM | POA: Diagnosis not present

## 2022-11-13 DIAGNOSIS — I110 Hypertensive heart disease with heart failure: Secondary | ICD-10-CM | POA: Diagnosis not present

## 2022-11-13 DIAGNOSIS — G9341 Metabolic encephalopathy: Secondary | ICD-10-CM | POA: Diagnosis not present

## 2022-11-13 DIAGNOSIS — G8929 Other chronic pain: Secondary | ICD-10-CM | POA: Diagnosis not present

## 2022-11-13 DIAGNOSIS — R911 Solitary pulmonary nodule: Secondary | ICD-10-CM | POA: Diagnosis not present

## 2022-11-13 DIAGNOSIS — I5042 Chronic combined systolic (congestive) and diastolic (congestive) heart failure: Secondary | ICD-10-CM | POA: Diagnosis not present

## 2022-11-13 DIAGNOSIS — S32039D Unspecified fracture of third lumbar vertebra, subsequent encounter for fracture with routine healing: Secondary | ICD-10-CM | POA: Diagnosis not present

## 2022-11-13 DIAGNOSIS — S0083XD Contusion of other part of head, subsequent encounter: Secondary | ICD-10-CM | POA: Diagnosis not present

## 2022-11-13 DIAGNOSIS — E876 Hypokalemia: Secondary | ICD-10-CM | POA: Diagnosis not present

## 2022-11-17 DIAGNOSIS — S7002XA Contusion of left hip, initial encounter: Secondary | ICD-10-CM | POA: Diagnosis not present

## 2022-11-17 DIAGNOSIS — D6869 Other thrombophilia: Secondary | ICD-10-CM | POA: Diagnosis not present

## 2022-11-18 DIAGNOSIS — K219 Gastro-esophageal reflux disease without esophagitis: Secondary | ICD-10-CM | POA: Diagnosis not present

## 2022-11-18 DIAGNOSIS — M47816 Spondylosis without myelopathy or radiculopathy, lumbar region: Secondary | ICD-10-CM | POA: Diagnosis not present

## 2022-11-18 DIAGNOSIS — S0083XD Contusion of other part of head, subsequent encounter: Secondary | ICD-10-CM | POA: Diagnosis not present

## 2022-11-18 DIAGNOSIS — I5042 Chronic combined systolic (congestive) and diastolic (congestive) heart failure: Secondary | ICD-10-CM | POA: Diagnosis not present

## 2022-11-18 DIAGNOSIS — I110 Hypertensive heart disease with heart failure: Secondary | ICD-10-CM | POA: Diagnosis not present

## 2022-11-18 DIAGNOSIS — Z7902 Long term (current) use of antithrombotics/antiplatelets: Secondary | ICD-10-CM | POA: Diagnosis not present

## 2022-11-18 DIAGNOSIS — S32039D Unspecified fracture of third lumbar vertebra, subsequent encounter for fracture with routine healing: Secondary | ICD-10-CM | POA: Diagnosis not present

## 2022-11-18 DIAGNOSIS — G5621 Lesion of ulnar nerve, right upper limb: Secondary | ICD-10-CM | POA: Diagnosis not present

## 2022-11-18 DIAGNOSIS — M069 Rheumatoid arthritis, unspecified: Secondary | ICD-10-CM | POA: Diagnosis not present

## 2022-11-18 DIAGNOSIS — G894 Chronic pain syndrome: Secondary | ICD-10-CM | POA: Diagnosis not present

## 2022-11-18 DIAGNOSIS — Z9181 History of falling: Secondary | ICD-10-CM | POA: Diagnosis not present

## 2022-11-18 DIAGNOSIS — E876 Hypokalemia: Secondary | ICD-10-CM | POA: Diagnosis not present

## 2022-11-18 DIAGNOSIS — G9341 Metabolic encephalopathy: Secondary | ICD-10-CM | POA: Diagnosis not present

## 2022-11-18 DIAGNOSIS — M9701XD Periprosthetic fracture around internal prosthetic right hip joint, subsequent encounter: Secondary | ICD-10-CM | POA: Diagnosis not present

## 2022-11-18 DIAGNOSIS — M0589 Other rheumatoid arthritis with rheumatoid factor of multiple sites: Secondary | ICD-10-CM | POA: Diagnosis not present

## 2022-11-18 DIAGNOSIS — R911 Solitary pulmonary nodule: Secondary | ICD-10-CM | POA: Diagnosis not present

## 2022-11-18 DIAGNOSIS — M1990 Unspecified osteoarthritis, unspecified site: Secondary | ICD-10-CM | POA: Diagnosis not present

## 2022-11-18 DIAGNOSIS — I739 Peripheral vascular disease, unspecified: Secondary | ICD-10-CM | POA: Diagnosis not present

## 2022-11-18 DIAGNOSIS — Z7952 Long term (current) use of systemic steroids: Secondary | ICD-10-CM | POA: Diagnosis not present

## 2022-11-18 DIAGNOSIS — S72141D Displaced intertrochanteric fracture of right femur, subsequent encounter for closed fracture with routine healing: Secondary | ICD-10-CM | POA: Diagnosis not present

## 2022-11-18 DIAGNOSIS — G8929 Other chronic pain: Secondary | ICD-10-CM | POA: Diagnosis not present

## 2022-11-20 DIAGNOSIS — K219 Gastro-esophageal reflux disease without esophagitis: Secondary | ICD-10-CM | POA: Diagnosis not present

## 2022-11-20 DIAGNOSIS — I110 Hypertensive heart disease with heart failure: Secondary | ICD-10-CM | POA: Diagnosis not present

## 2022-11-20 DIAGNOSIS — E876 Hypokalemia: Secondary | ICD-10-CM | POA: Diagnosis not present

## 2022-11-20 DIAGNOSIS — M069 Rheumatoid arthritis, unspecified: Secondary | ICD-10-CM | POA: Diagnosis not present

## 2022-11-20 DIAGNOSIS — G8929 Other chronic pain: Secondary | ICD-10-CM | POA: Diagnosis not present

## 2022-11-20 DIAGNOSIS — M9701XD Periprosthetic fracture around internal prosthetic right hip joint, subsequent encounter: Secondary | ICD-10-CM | POA: Diagnosis not present

## 2022-11-20 DIAGNOSIS — M1990 Unspecified osteoarthritis, unspecified site: Secondary | ICD-10-CM | POA: Diagnosis not present

## 2022-11-20 DIAGNOSIS — Z7902 Long term (current) use of antithrombotics/antiplatelets: Secondary | ICD-10-CM | POA: Diagnosis not present

## 2022-11-20 DIAGNOSIS — I739 Peripheral vascular disease, unspecified: Secondary | ICD-10-CM | POA: Diagnosis not present

## 2022-11-20 DIAGNOSIS — R911 Solitary pulmonary nodule: Secondary | ICD-10-CM | POA: Diagnosis not present

## 2022-11-20 DIAGNOSIS — S72141D Displaced intertrochanteric fracture of right femur, subsequent encounter for closed fracture with routine healing: Secondary | ICD-10-CM | POA: Diagnosis not present

## 2022-11-20 DIAGNOSIS — G9341 Metabolic encephalopathy: Secondary | ICD-10-CM | POA: Diagnosis not present

## 2022-11-20 DIAGNOSIS — S32039D Unspecified fracture of third lumbar vertebra, subsequent encounter for fracture with routine healing: Secondary | ICD-10-CM | POA: Diagnosis not present

## 2022-11-20 DIAGNOSIS — Z7952 Long term (current) use of systemic steroids: Secondary | ICD-10-CM | POA: Diagnosis not present

## 2022-11-20 DIAGNOSIS — S0083XD Contusion of other part of head, subsequent encounter: Secondary | ICD-10-CM | POA: Diagnosis not present

## 2022-11-20 DIAGNOSIS — I5042 Chronic combined systolic (congestive) and diastolic (congestive) heart failure: Secondary | ICD-10-CM | POA: Diagnosis not present

## 2022-11-20 DIAGNOSIS — Z9181 History of falling: Secondary | ICD-10-CM | POA: Diagnosis not present

## 2022-11-24 DIAGNOSIS — Z9181 History of falling: Secondary | ICD-10-CM | POA: Diagnosis not present

## 2022-11-24 DIAGNOSIS — G9341 Metabolic encephalopathy: Secondary | ICD-10-CM | POA: Diagnosis not present

## 2022-11-24 DIAGNOSIS — I110 Hypertensive heart disease with heart failure: Secondary | ICD-10-CM | POA: Diagnosis not present

## 2022-11-24 DIAGNOSIS — M1990 Unspecified osteoarthritis, unspecified site: Secondary | ICD-10-CM | POA: Diagnosis not present

## 2022-11-24 DIAGNOSIS — Z7902 Long term (current) use of antithrombotics/antiplatelets: Secondary | ICD-10-CM | POA: Diagnosis not present

## 2022-11-24 DIAGNOSIS — M9701XD Periprosthetic fracture around internal prosthetic right hip joint, subsequent encounter: Secondary | ICD-10-CM | POA: Diagnosis not present

## 2022-11-24 DIAGNOSIS — Z7952 Long term (current) use of systemic steroids: Secondary | ICD-10-CM | POA: Diagnosis not present

## 2022-11-24 DIAGNOSIS — I5042 Chronic combined systolic (congestive) and diastolic (congestive) heart failure: Secondary | ICD-10-CM | POA: Diagnosis not present

## 2022-11-24 DIAGNOSIS — S72141D Displaced intertrochanteric fracture of right femur, subsequent encounter for closed fracture with routine healing: Secondary | ICD-10-CM | POA: Diagnosis not present

## 2022-11-24 DIAGNOSIS — G8929 Other chronic pain: Secondary | ICD-10-CM | POA: Diagnosis not present

## 2022-11-24 DIAGNOSIS — R911 Solitary pulmonary nodule: Secondary | ICD-10-CM | POA: Diagnosis not present

## 2022-11-24 DIAGNOSIS — I739 Peripheral vascular disease, unspecified: Secondary | ICD-10-CM | POA: Diagnosis not present

## 2022-11-24 DIAGNOSIS — K219 Gastro-esophageal reflux disease without esophagitis: Secondary | ICD-10-CM | POA: Diagnosis not present

## 2022-11-24 DIAGNOSIS — S32039D Unspecified fracture of third lumbar vertebra, subsequent encounter for fracture with routine healing: Secondary | ICD-10-CM | POA: Diagnosis not present

## 2022-11-24 DIAGNOSIS — E876 Hypokalemia: Secondary | ICD-10-CM | POA: Diagnosis not present

## 2022-11-24 DIAGNOSIS — M069 Rheumatoid arthritis, unspecified: Secondary | ICD-10-CM | POA: Diagnosis not present

## 2022-11-24 DIAGNOSIS — S0083XD Contusion of other part of head, subsequent encounter: Secondary | ICD-10-CM | POA: Diagnosis not present

## 2022-11-25 DIAGNOSIS — S72141D Displaced intertrochanteric fracture of right femur, subsequent encounter for closed fracture with routine healing: Secondary | ICD-10-CM | POA: Diagnosis not present

## 2022-11-25 DIAGNOSIS — K219 Gastro-esophageal reflux disease without esophagitis: Secondary | ICD-10-CM | POA: Diagnosis not present

## 2022-11-25 DIAGNOSIS — S0083XD Contusion of other part of head, subsequent encounter: Secondary | ICD-10-CM | POA: Diagnosis not present

## 2022-11-25 DIAGNOSIS — I739 Peripheral vascular disease, unspecified: Secondary | ICD-10-CM | POA: Diagnosis not present

## 2022-11-25 DIAGNOSIS — R911 Solitary pulmonary nodule: Secondary | ICD-10-CM | POA: Diagnosis not present

## 2022-11-25 DIAGNOSIS — I110 Hypertensive heart disease with heart failure: Secondary | ICD-10-CM | POA: Diagnosis not present

## 2022-11-25 DIAGNOSIS — I5042 Chronic combined systolic (congestive) and diastolic (congestive) heart failure: Secondary | ICD-10-CM | POA: Diagnosis not present

## 2022-11-25 DIAGNOSIS — Z7952 Long term (current) use of systemic steroids: Secondary | ICD-10-CM | POA: Diagnosis not present

## 2022-11-25 DIAGNOSIS — Z9181 History of falling: Secondary | ICD-10-CM | POA: Diagnosis not present

## 2022-11-25 DIAGNOSIS — G9341 Metabolic encephalopathy: Secondary | ICD-10-CM | POA: Diagnosis not present

## 2022-11-25 DIAGNOSIS — M1990 Unspecified osteoarthritis, unspecified site: Secondary | ICD-10-CM | POA: Diagnosis not present

## 2022-11-25 DIAGNOSIS — G8929 Other chronic pain: Secondary | ICD-10-CM | POA: Diagnosis not present

## 2022-11-25 DIAGNOSIS — M069 Rheumatoid arthritis, unspecified: Secondary | ICD-10-CM | POA: Diagnosis not present

## 2022-11-25 DIAGNOSIS — Z7902 Long term (current) use of antithrombotics/antiplatelets: Secondary | ICD-10-CM | POA: Diagnosis not present

## 2022-11-25 DIAGNOSIS — E876 Hypokalemia: Secondary | ICD-10-CM | POA: Diagnosis not present

## 2022-11-25 DIAGNOSIS — M9701XD Periprosthetic fracture around internal prosthetic right hip joint, subsequent encounter: Secondary | ICD-10-CM | POA: Diagnosis not present

## 2022-11-25 DIAGNOSIS — S32039D Unspecified fracture of third lumbar vertebra, subsequent encounter for fracture with routine healing: Secondary | ICD-10-CM | POA: Diagnosis not present

## 2022-11-28 DIAGNOSIS — I5042 Chronic combined systolic (congestive) and diastolic (congestive) heart failure: Secondary | ICD-10-CM | POA: Diagnosis not present

## 2022-11-28 DIAGNOSIS — I739 Peripheral vascular disease, unspecified: Secondary | ICD-10-CM | POA: Diagnosis not present

## 2022-12-01 DIAGNOSIS — M069 Rheumatoid arthritis, unspecified: Secondary | ICD-10-CM | POA: Diagnosis not present

## 2022-12-01 DIAGNOSIS — M1990 Unspecified osteoarthritis, unspecified site: Secondary | ICD-10-CM | POA: Diagnosis not present

## 2022-12-01 DIAGNOSIS — I5042 Chronic combined systolic (congestive) and diastolic (congestive) heart failure: Secondary | ICD-10-CM | POA: Diagnosis not present

## 2022-12-01 DIAGNOSIS — Z7902 Long term (current) use of antithrombotics/antiplatelets: Secondary | ICD-10-CM | POA: Diagnosis not present

## 2022-12-01 DIAGNOSIS — M9701XD Periprosthetic fracture around internal prosthetic right hip joint, subsequent encounter: Secondary | ICD-10-CM | POA: Diagnosis not present

## 2022-12-01 DIAGNOSIS — G9341 Metabolic encephalopathy: Secondary | ICD-10-CM | POA: Diagnosis not present

## 2022-12-01 DIAGNOSIS — K219 Gastro-esophageal reflux disease without esophagitis: Secondary | ICD-10-CM | POA: Diagnosis not present

## 2022-12-01 DIAGNOSIS — G8929 Other chronic pain: Secondary | ICD-10-CM | POA: Diagnosis not present

## 2022-12-01 DIAGNOSIS — Z9181 History of falling: Secondary | ICD-10-CM | POA: Diagnosis not present

## 2022-12-01 DIAGNOSIS — R911 Solitary pulmonary nodule: Secondary | ICD-10-CM | POA: Diagnosis not present

## 2022-12-01 DIAGNOSIS — S32039D Unspecified fracture of third lumbar vertebra, subsequent encounter for fracture with routine healing: Secondary | ICD-10-CM | POA: Diagnosis not present

## 2022-12-01 DIAGNOSIS — I110 Hypertensive heart disease with heart failure: Secondary | ICD-10-CM | POA: Diagnosis not present

## 2022-12-01 DIAGNOSIS — Z7952 Long term (current) use of systemic steroids: Secondary | ICD-10-CM | POA: Diagnosis not present

## 2022-12-01 DIAGNOSIS — E876 Hypokalemia: Secondary | ICD-10-CM | POA: Diagnosis not present

## 2022-12-01 DIAGNOSIS — S0083XD Contusion of other part of head, subsequent encounter: Secondary | ICD-10-CM | POA: Diagnosis not present

## 2022-12-01 DIAGNOSIS — S72141D Displaced intertrochanteric fracture of right femur, subsequent encounter for closed fracture with routine healing: Secondary | ICD-10-CM | POA: Diagnosis not present

## 2022-12-01 DIAGNOSIS — I739 Peripheral vascular disease, unspecified: Secondary | ICD-10-CM | POA: Diagnosis not present

## 2022-12-02 DIAGNOSIS — M25551 Pain in right hip: Secondary | ICD-10-CM | POA: Diagnosis not present

## 2022-12-03 DIAGNOSIS — R600 Localized edema: Secondary | ICD-10-CM | POA: Diagnosis not present

## 2022-12-03 DIAGNOSIS — Z79899 Other long term (current) drug therapy: Secondary | ICD-10-CM | POA: Diagnosis not present

## 2022-12-03 DIAGNOSIS — R5382 Chronic fatigue, unspecified: Secondary | ICD-10-CM | POA: Diagnosis not present

## 2022-12-03 DIAGNOSIS — Z6822 Body mass index (BMI) 22.0-22.9, adult: Secondary | ICD-10-CM | POA: Diagnosis not present

## 2022-12-03 DIAGNOSIS — M0579 Rheumatoid arthritis with rheumatoid factor of multiple sites without organ or systems involvement: Secondary | ICD-10-CM | POA: Diagnosis not present

## 2022-12-03 DIAGNOSIS — M1991 Primary osteoarthritis, unspecified site: Secondary | ICD-10-CM | POA: Diagnosis not present

## 2022-12-04 DIAGNOSIS — Z9181 History of falling: Secondary | ICD-10-CM | POA: Diagnosis not present

## 2022-12-04 DIAGNOSIS — Z7952 Long term (current) use of systemic steroids: Secondary | ICD-10-CM | POA: Diagnosis not present

## 2022-12-04 DIAGNOSIS — R911 Solitary pulmonary nodule: Secondary | ICD-10-CM | POA: Diagnosis not present

## 2022-12-04 DIAGNOSIS — G8929 Other chronic pain: Secondary | ICD-10-CM | POA: Diagnosis not present

## 2022-12-04 DIAGNOSIS — Z7902 Long term (current) use of antithrombotics/antiplatelets: Secondary | ICD-10-CM | POA: Diagnosis not present

## 2022-12-04 DIAGNOSIS — I110 Hypertensive heart disease with heart failure: Secondary | ICD-10-CM | POA: Diagnosis not present

## 2022-12-04 DIAGNOSIS — I739 Peripheral vascular disease, unspecified: Secondary | ICD-10-CM | POA: Diagnosis not present

## 2022-12-04 DIAGNOSIS — E876 Hypokalemia: Secondary | ICD-10-CM | POA: Diagnosis not present

## 2022-12-04 DIAGNOSIS — G9341 Metabolic encephalopathy: Secondary | ICD-10-CM | POA: Diagnosis not present

## 2022-12-04 DIAGNOSIS — S72141D Displaced intertrochanteric fracture of right femur, subsequent encounter for closed fracture with routine healing: Secondary | ICD-10-CM | POA: Diagnosis not present

## 2022-12-04 DIAGNOSIS — S32039D Unspecified fracture of third lumbar vertebra, subsequent encounter for fracture with routine healing: Secondary | ICD-10-CM | POA: Diagnosis not present

## 2022-12-04 DIAGNOSIS — K219 Gastro-esophageal reflux disease without esophagitis: Secondary | ICD-10-CM | POA: Diagnosis not present

## 2022-12-04 DIAGNOSIS — M9701XD Periprosthetic fracture around internal prosthetic right hip joint, subsequent encounter: Secondary | ICD-10-CM | POA: Diagnosis not present

## 2022-12-04 DIAGNOSIS — S0083XD Contusion of other part of head, subsequent encounter: Secondary | ICD-10-CM | POA: Diagnosis not present

## 2022-12-04 DIAGNOSIS — M069 Rheumatoid arthritis, unspecified: Secondary | ICD-10-CM | POA: Diagnosis not present

## 2022-12-04 DIAGNOSIS — I5042 Chronic combined systolic (congestive) and diastolic (congestive) heart failure: Secondary | ICD-10-CM | POA: Diagnosis not present

## 2022-12-04 DIAGNOSIS — M1990 Unspecified osteoarthritis, unspecified site: Secondary | ICD-10-CM | POA: Diagnosis not present

## 2022-12-09 DIAGNOSIS — M069 Rheumatoid arthritis, unspecified: Secondary | ICD-10-CM | POA: Diagnosis not present

## 2022-12-09 DIAGNOSIS — G8929 Other chronic pain: Secondary | ICD-10-CM | POA: Diagnosis not present

## 2022-12-09 DIAGNOSIS — Z7952 Long term (current) use of systemic steroids: Secondary | ICD-10-CM | POA: Diagnosis not present

## 2022-12-09 DIAGNOSIS — S0083XD Contusion of other part of head, subsequent encounter: Secondary | ICD-10-CM | POA: Diagnosis not present

## 2022-12-09 DIAGNOSIS — S32039D Unspecified fracture of third lumbar vertebra, subsequent encounter for fracture with routine healing: Secondary | ICD-10-CM | POA: Diagnosis not present

## 2022-12-09 DIAGNOSIS — R911 Solitary pulmonary nodule: Secondary | ICD-10-CM | POA: Diagnosis not present

## 2022-12-09 DIAGNOSIS — Z7902 Long term (current) use of antithrombotics/antiplatelets: Secondary | ICD-10-CM | POA: Diagnosis not present

## 2022-12-09 DIAGNOSIS — S72141D Displaced intertrochanteric fracture of right femur, subsequent encounter for closed fracture with routine healing: Secondary | ICD-10-CM | POA: Diagnosis not present

## 2022-12-09 DIAGNOSIS — M1990 Unspecified osteoarthritis, unspecified site: Secondary | ICD-10-CM | POA: Diagnosis not present

## 2022-12-09 DIAGNOSIS — I739 Peripheral vascular disease, unspecified: Secondary | ICD-10-CM | POA: Diagnosis not present

## 2022-12-09 DIAGNOSIS — I110 Hypertensive heart disease with heart failure: Secondary | ICD-10-CM | POA: Diagnosis not present

## 2022-12-09 DIAGNOSIS — Z9181 History of falling: Secondary | ICD-10-CM | POA: Diagnosis not present

## 2022-12-09 DIAGNOSIS — I5042 Chronic combined systolic (congestive) and diastolic (congestive) heart failure: Secondary | ICD-10-CM | POA: Diagnosis not present

## 2022-12-09 DIAGNOSIS — M9701XD Periprosthetic fracture around internal prosthetic right hip joint, subsequent encounter: Secondary | ICD-10-CM | POA: Diagnosis not present

## 2022-12-09 DIAGNOSIS — K219 Gastro-esophageal reflux disease without esophagitis: Secondary | ICD-10-CM | POA: Diagnosis not present

## 2022-12-09 DIAGNOSIS — E876 Hypokalemia: Secondary | ICD-10-CM | POA: Diagnosis not present

## 2022-12-09 DIAGNOSIS — G9341 Metabolic encephalopathy: Secondary | ICD-10-CM | POA: Diagnosis not present

## 2022-12-16 DIAGNOSIS — S0083XD Contusion of other part of head, subsequent encounter: Secondary | ICD-10-CM | POA: Diagnosis not present

## 2022-12-16 DIAGNOSIS — Z7902 Long term (current) use of antithrombotics/antiplatelets: Secondary | ICD-10-CM | POA: Diagnosis not present

## 2022-12-16 DIAGNOSIS — K219 Gastro-esophageal reflux disease without esophagitis: Secondary | ICD-10-CM | POA: Diagnosis not present

## 2022-12-16 DIAGNOSIS — I739 Peripheral vascular disease, unspecified: Secondary | ICD-10-CM | POA: Diagnosis not present

## 2022-12-16 DIAGNOSIS — M9701XD Periprosthetic fracture around internal prosthetic right hip joint, subsequent encounter: Secondary | ICD-10-CM | POA: Diagnosis not present

## 2022-12-16 DIAGNOSIS — M069 Rheumatoid arthritis, unspecified: Secondary | ICD-10-CM | POA: Diagnosis not present

## 2022-12-16 DIAGNOSIS — S32039D Unspecified fracture of third lumbar vertebra, subsequent encounter for fracture with routine healing: Secondary | ICD-10-CM | POA: Diagnosis not present

## 2022-12-16 DIAGNOSIS — S72141D Displaced intertrochanteric fracture of right femur, subsequent encounter for closed fracture with routine healing: Secondary | ICD-10-CM | POA: Diagnosis not present

## 2022-12-16 DIAGNOSIS — E876 Hypokalemia: Secondary | ICD-10-CM | POA: Diagnosis not present

## 2022-12-16 DIAGNOSIS — I5042 Chronic combined systolic (congestive) and diastolic (congestive) heart failure: Secondary | ICD-10-CM | POA: Diagnosis not present

## 2022-12-16 DIAGNOSIS — R911 Solitary pulmonary nodule: Secondary | ICD-10-CM | POA: Diagnosis not present

## 2022-12-16 DIAGNOSIS — I110 Hypertensive heart disease with heart failure: Secondary | ICD-10-CM | POA: Diagnosis not present

## 2022-12-16 DIAGNOSIS — Z7952 Long term (current) use of systemic steroids: Secondary | ICD-10-CM | POA: Diagnosis not present

## 2022-12-16 DIAGNOSIS — M1990 Unspecified osteoarthritis, unspecified site: Secondary | ICD-10-CM | POA: Diagnosis not present

## 2022-12-16 DIAGNOSIS — G9341 Metabolic encephalopathy: Secondary | ICD-10-CM | POA: Diagnosis not present

## 2022-12-16 DIAGNOSIS — G8929 Other chronic pain: Secondary | ICD-10-CM | POA: Diagnosis not present

## 2022-12-16 DIAGNOSIS — Z9181 History of falling: Secondary | ICD-10-CM | POA: Diagnosis not present

## 2022-12-18 DIAGNOSIS — G9341 Metabolic encephalopathy: Secondary | ICD-10-CM | POA: Diagnosis not present

## 2022-12-18 DIAGNOSIS — Z7952 Long term (current) use of systemic steroids: Secondary | ICD-10-CM | POA: Diagnosis not present

## 2022-12-18 DIAGNOSIS — I5042 Chronic combined systolic (congestive) and diastolic (congestive) heart failure: Secondary | ICD-10-CM | POA: Diagnosis not present

## 2022-12-18 DIAGNOSIS — Z7902 Long term (current) use of antithrombotics/antiplatelets: Secondary | ICD-10-CM | POA: Diagnosis not present

## 2022-12-18 DIAGNOSIS — S72141D Displaced intertrochanteric fracture of right femur, subsequent encounter for closed fracture with routine healing: Secondary | ICD-10-CM | POA: Diagnosis not present

## 2022-12-18 DIAGNOSIS — S0083XD Contusion of other part of head, subsequent encounter: Secondary | ICD-10-CM | POA: Diagnosis not present

## 2022-12-18 DIAGNOSIS — R600 Localized edema: Secondary | ICD-10-CM | POA: Diagnosis not present

## 2022-12-18 DIAGNOSIS — K219 Gastro-esophageal reflux disease without esophagitis: Secondary | ICD-10-CM | POA: Diagnosis not present

## 2022-12-18 DIAGNOSIS — R911 Solitary pulmonary nodule: Secondary | ICD-10-CM | POA: Diagnosis not present

## 2022-12-18 DIAGNOSIS — M1990 Unspecified osteoarthritis, unspecified site: Secondary | ICD-10-CM | POA: Diagnosis not present

## 2022-12-18 DIAGNOSIS — G8929 Other chronic pain: Secondary | ICD-10-CM | POA: Diagnosis not present

## 2022-12-18 DIAGNOSIS — S7002XA Contusion of left hip, initial encounter: Secondary | ICD-10-CM | POA: Diagnosis not present

## 2022-12-18 DIAGNOSIS — Z9181 History of falling: Secondary | ICD-10-CM | POA: Diagnosis not present

## 2022-12-18 DIAGNOSIS — M069 Rheumatoid arthritis, unspecified: Secondary | ICD-10-CM | POA: Diagnosis not present

## 2022-12-18 DIAGNOSIS — L89602 Pressure ulcer of unspecified heel, stage 2: Secondary | ICD-10-CM | POA: Diagnosis not present

## 2022-12-18 DIAGNOSIS — I110 Hypertensive heart disease with heart failure: Secondary | ICD-10-CM | POA: Diagnosis not present

## 2022-12-18 DIAGNOSIS — L97919 Non-pressure chronic ulcer of unspecified part of right lower leg with unspecified severity: Secondary | ICD-10-CM | POA: Diagnosis not present

## 2022-12-18 DIAGNOSIS — S32039D Unspecified fracture of third lumbar vertebra, subsequent encounter for fracture with routine healing: Secondary | ICD-10-CM | POA: Diagnosis not present

## 2022-12-18 DIAGNOSIS — M9701XD Periprosthetic fracture around internal prosthetic right hip joint, subsequent encounter: Secondary | ICD-10-CM | POA: Diagnosis not present

## 2022-12-18 DIAGNOSIS — D6869 Other thrombophilia: Secondary | ICD-10-CM | POA: Diagnosis not present

## 2022-12-18 DIAGNOSIS — E876 Hypokalemia: Secondary | ICD-10-CM | POA: Diagnosis not present

## 2022-12-18 DIAGNOSIS — I739 Peripheral vascular disease, unspecified: Secondary | ICD-10-CM | POA: Diagnosis not present

## 2022-12-20 DIAGNOSIS — I5042 Chronic combined systolic (congestive) and diastolic (congestive) heart failure: Secondary | ICD-10-CM | POA: Diagnosis not present

## 2022-12-20 DIAGNOSIS — G8929 Other chronic pain: Secondary | ICD-10-CM | POA: Diagnosis not present

## 2022-12-20 DIAGNOSIS — M069 Rheumatoid arthritis, unspecified: Secondary | ICD-10-CM | POA: Diagnosis not present

## 2022-12-20 DIAGNOSIS — I739 Peripheral vascular disease, unspecified: Secondary | ICD-10-CM | POA: Diagnosis not present

## 2022-12-20 DIAGNOSIS — I110 Hypertensive heart disease with heart failure: Secondary | ICD-10-CM | POA: Diagnosis not present

## 2022-12-20 DIAGNOSIS — G9341 Metabolic encephalopathy: Secondary | ICD-10-CM | POA: Diagnosis not present

## 2022-12-20 DIAGNOSIS — S72141D Displaced intertrochanteric fracture of right femur, subsequent encounter for closed fracture with routine healing: Secondary | ICD-10-CM | POA: Diagnosis not present

## 2022-12-20 DIAGNOSIS — S32039D Unspecified fracture of third lumbar vertebra, subsequent encounter for fracture with routine healing: Secondary | ICD-10-CM | POA: Diagnosis not present

## 2022-12-20 DIAGNOSIS — M1990 Unspecified osteoarthritis, unspecified site: Secondary | ICD-10-CM | POA: Diagnosis not present

## 2022-12-20 DIAGNOSIS — M9701XD Periprosthetic fracture around internal prosthetic right hip joint, subsequent encounter: Secondary | ICD-10-CM | POA: Diagnosis not present

## 2022-12-20 DIAGNOSIS — S0083XD Contusion of other part of head, subsequent encounter: Secondary | ICD-10-CM | POA: Diagnosis not present

## 2022-12-20 DIAGNOSIS — E876 Hypokalemia: Secondary | ICD-10-CM | POA: Diagnosis not present

## 2022-12-22 DIAGNOSIS — R911 Solitary pulmonary nodule: Secondary | ICD-10-CM | POA: Diagnosis not present

## 2022-12-22 DIAGNOSIS — M9701XD Periprosthetic fracture around internal prosthetic right hip joint, subsequent encounter: Secondary | ICD-10-CM | POA: Diagnosis not present

## 2022-12-22 DIAGNOSIS — Z7902 Long term (current) use of antithrombotics/antiplatelets: Secondary | ICD-10-CM | POA: Diagnosis not present

## 2022-12-22 DIAGNOSIS — I110 Hypertensive heart disease with heart failure: Secondary | ICD-10-CM | POA: Diagnosis not present

## 2022-12-22 DIAGNOSIS — S72141D Displaced intertrochanteric fracture of right femur, subsequent encounter for closed fracture with routine healing: Secondary | ICD-10-CM | POA: Diagnosis not present

## 2022-12-22 DIAGNOSIS — H548 Legal blindness, as defined in USA: Secondary | ICD-10-CM | POA: Diagnosis not present

## 2022-12-22 DIAGNOSIS — K219 Gastro-esophageal reflux disease without esophagitis: Secondary | ICD-10-CM | POA: Diagnosis not present

## 2022-12-22 DIAGNOSIS — S0083XD Contusion of other part of head, subsequent encounter: Secondary | ICD-10-CM | POA: Diagnosis not present

## 2022-12-22 DIAGNOSIS — S32039D Unspecified fracture of third lumbar vertebra, subsequent encounter for fracture with routine healing: Secondary | ICD-10-CM | POA: Diagnosis not present

## 2022-12-22 DIAGNOSIS — M1990 Unspecified osteoarthritis, unspecified site: Secondary | ICD-10-CM | POA: Diagnosis not present

## 2022-12-22 DIAGNOSIS — Z86010 Personal history of colonic polyps: Secondary | ICD-10-CM | POA: Diagnosis not present

## 2022-12-22 DIAGNOSIS — I739 Peripheral vascular disease, unspecified: Secondary | ICD-10-CM | POA: Diagnosis not present

## 2022-12-22 DIAGNOSIS — M069 Rheumatoid arthritis, unspecified: Secondary | ICD-10-CM | POA: Diagnosis not present

## 2022-12-22 DIAGNOSIS — I5042 Chronic combined systolic (congestive) and diastolic (congestive) heart failure: Secondary | ICD-10-CM | POA: Diagnosis not present

## 2022-12-22 DIAGNOSIS — G8929 Other chronic pain: Secondary | ICD-10-CM | POA: Diagnosis not present

## 2022-12-22 DIAGNOSIS — Z9181 History of falling: Secondary | ICD-10-CM | POA: Diagnosis not present

## 2022-12-22 DIAGNOSIS — G9341 Metabolic encephalopathy: Secondary | ICD-10-CM | POA: Diagnosis not present

## 2022-12-22 DIAGNOSIS — H409 Unspecified glaucoma: Secondary | ICD-10-CM | POA: Diagnosis not present

## 2022-12-22 DIAGNOSIS — Z7952 Long term (current) use of systemic steroids: Secondary | ICD-10-CM | POA: Diagnosis not present

## 2022-12-22 DIAGNOSIS — E876 Hypokalemia: Secondary | ICD-10-CM | POA: Diagnosis not present

## 2022-12-23 DIAGNOSIS — I5042 Chronic combined systolic (congestive) and diastolic (congestive) heart failure: Secondary | ICD-10-CM | POA: Diagnosis not present

## 2022-12-23 DIAGNOSIS — S0083XD Contusion of other part of head, subsequent encounter: Secondary | ICD-10-CM | POA: Diagnosis not present

## 2022-12-23 DIAGNOSIS — M9701XD Periprosthetic fracture around internal prosthetic right hip joint, subsequent encounter: Secondary | ICD-10-CM | POA: Diagnosis not present

## 2022-12-23 DIAGNOSIS — Z9181 History of falling: Secondary | ICD-10-CM | POA: Diagnosis not present

## 2022-12-23 DIAGNOSIS — G8929 Other chronic pain: Secondary | ICD-10-CM | POA: Diagnosis not present

## 2022-12-23 DIAGNOSIS — I739 Peripheral vascular disease, unspecified: Secondary | ICD-10-CM | POA: Diagnosis not present

## 2022-12-23 DIAGNOSIS — R911 Solitary pulmonary nodule: Secondary | ICD-10-CM | POA: Diagnosis not present

## 2022-12-23 DIAGNOSIS — S72141D Displaced intertrochanteric fracture of right femur, subsequent encounter for closed fracture with routine healing: Secondary | ICD-10-CM | POA: Diagnosis not present

## 2022-12-23 DIAGNOSIS — K219 Gastro-esophageal reflux disease without esophagitis: Secondary | ICD-10-CM | POA: Diagnosis not present

## 2022-12-23 DIAGNOSIS — G9341 Metabolic encephalopathy: Secondary | ICD-10-CM | POA: Diagnosis not present

## 2022-12-23 DIAGNOSIS — Z86010 Personal history of colonic polyps: Secondary | ICD-10-CM | POA: Diagnosis not present

## 2022-12-23 DIAGNOSIS — H409 Unspecified glaucoma: Secondary | ICD-10-CM | POA: Diagnosis not present

## 2022-12-23 DIAGNOSIS — Z7952 Long term (current) use of systemic steroids: Secondary | ICD-10-CM | POA: Diagnosis not present

## 2022-12-23 DIAGNOSIS — I110 Hypertensive heart disease with heart failure: Secondary | ICD-10-CM | POA: Diagnosis not present

## 2022-12-23 DIAGNOSIS — M1990 Unspecified osteoarthritis, unspecified site: Secondary | ICD-10-CM | POA: Diagnosis not present

## 2022-12-23 DIAGNOSIS — S32039D Unspecified fracture of third lumbar vertebra, subsequent encounter for fracture with routine healing: Secondary | ICD-10-CM | POA: Diagnosis not present

## 2022-12-23 DIAGNOSIS — E876 Hypokalemia: Secondary | ICD-10-CM | POA: Diagnosis not present

## 2022-12-23 DIAGNOSIS — M069 Rheumatoid arthritis, unspecified: Secondary | ICD-10-CM | POA: Diagnosis not present

## 2022-12-23 DIAGNOSIS — Z7902 Long term (current) use of antithrombotics/antiplatelets: Secondary | ICD-10-CM | POA: Diagnosis not present

## 2022-12-23 DIAGNOSIS — H548 Legal blindness, as defined in USA: Secondary | ICD-10-CM | POA: Diagnosis not present

## 2022-12-24 DIAGNOSIS — Z86010 Personal history of colonic polyps: Secondary | ICD-10-CM | POA: Diagnosis not present

## 2022-12-24 DIAGNOSIS — S32039D Unspecified fracture of third lumbar vertebra, subsequent encounter for fracture with routine healing: Secondary | ICD-10-CM | POA: Diagnosis not present

## 2022-12-24 DIAGNOSIS — M069 Rheumatoid arthritis, unspecified: Secondary | ICD-10-CM | POA: Diagnosis not present

## 2022-12-24 DIAGNOSIS — Z7952 Long term (current) use of systemic steroids: Secondary | ICD-10-CM | POA: Diagnosis not present

## 2022-12-24 DIAGNOSIS — R911 Solitary pulmonary nodule: Secondary | ICD-10-CM | POA: Diagnosis not present

## 2022-12-24 DIAGNOSIS — M1990 Unspecified osteoarthritis, unspecified site: Secondary | ICD-10-CM | POA: Diagnosis not present

## 2022-12-24 DIAGNOSIS — K219 Gastro-esophageal reflux disease without esophagitis: Secondary | ICD-10-CM | POA: Diagnosis not present

## 2022-12-24 DIAGNOSIS — E876 Hypokalemia: Secondary | ICD-10-CM | POA: Diagnosis not present

## 2022-12-24 DIAGNOSIS — H548 Legal blindness, as defined in USA: Secondary | ICD-10-CM | POA: Diagnosis not present

## 2022-12-24 DIAGNOSIS — I110 Hypertensive heart disease with heart failure: Secondary | ICD-10-CM | POA: Diagnosis not present

## 2022-12-24 DIAGNOSIS — I739 Peripheral vascular disease, unspecified: Secondary | ICD-10-CM | POA: Diagnosis not present

## 2022-12-24 DIAGNOSIS — Z7902 Long term (current) use of antithrombotics/antiplatelets: Secondary | ICD-10-CM | POA: Diagnosis not present

## 2022-12-24 DIAGNOSIS — I5042 Chronic combined systolic (congestive) and diastolic (congestive) heart failure: Secondary | ICD-10-CM | POA: Diagnosis not present

## 2022-12-24 DIAGNOSIS — G8929 Other chronic pain: Secondary | ICD-10-CM | POA: Diagnosis not present

## 2022-12-24 DIAGNOSIS — S72141D Displaced intertrochanteric fracture of right femur, subsequent encounter for closed fracture with routine healing: Secondary | ICD-10-CM | POA: Diagnosis not present

## 2022-12-24 DIAGNOSIS — Z9181 History of falling: Secondary | ICD-10-CM | POA: Diagnosis not present

## 2022-12-24 DIAGNOSIS — G9341 Metabolic encephalopathy: Secondary | ICD-10-CM | POA: Diagnosis not present

## 2022-12-24 DIAGNOSIS — H409 Unspecified glaucoma: Secondary | ICD-10-CM | POA: Diagnosis not present

## 2022-12-24 DIAGNOSIS — M9701XD Periprosthetic fracture around internal prosthetic right hip joint, subsequent encounter: Secondary | ICD-10-CM | POA: Diagnosis not present

## 2022-12-24 DIAGNOSIS — S0083XD Contusion of other part of head, subsequent encounter: Secondary | ICD-10-CM | POA: Diagnosis not present

## 2022-12-29 DIAGNOSIS — R911 Solitary pulmonary nodule: Secondary | ICD-10-CM | POA: Diagnosis not present

## 2022-12-29 DIAGNOSIS — H409 Unspecified glaucoma: Secondary | ICD-10-CM | POA: Diagnosis not present

## 2022-12-29 DIAGNOSIS — S32039D Unspecified fracture of third lumbar vertebra, subsequent encounter for fracture with routine healing: Secondary | ICD-10-CM | POA: Diagnosis not present

## 2022-12-29 DIAGNOSIS — Z86010 Personal history of colonic polyps: Secondary | ICD-10-CM | POA: Diagnosis not present

## 2022-12-29 DIAGNOSIS — S72141D Displaced intertrochanteric fracture of right femur, subsequent encounter for closed fracture with routine healing: Secondary | ICD-10-CM | POA: Diagnosis not present

## 2022-12-29 DIAGNOSIS — E876 Hypokalemia: Secondary | ICD-10-CM | POA: Diagnosis not present

## 2022-12-29 DIAGNOSIS — M9701XD Periprosthetic fracture around internal prosthetic right hip joint, subsequent encounter: Secondary | ICD-10-CM | POA: Diagnosis not present

## 2022-12-29 DIAGNOSIS — I5042 Chronic combined systolic (congestive) and diastolic (congestive) heart failure: Secondary | ICD-10-CM | POA: Diagnosis not present

## 2022-12-29 DIAGNOSIS — H548 Legal blindness, as defined in USA: Secondary | ICD-10-CM | POA: Diagnosis not present

## 2022-12-29 DIAGNOSIS — G9341 Metabolic encephalopathy: Secondary | ICD-10-CM | POA: Diagnosis not present

## 2022-12-29 DIAGNOSIS — Z7902 Long term (current) use of antithrombotics/antiplatelets: Secondary | ICD-10-CM | POA: Diagnosis not present

## 2022-12-29 DIAGNOSIS — G8929 Other chronic pain: Secondary | ICD-10-CM | POA: Diagnosis not present

## 2022-12-29 DIAGNOSIS — Z7952 Long term (current) use of systemic steroids: Secondary | ICD-10-CM | POA: Diagnosis not present

## 2022-12-29 DIAGNOSIS — I110 Hypertensive heart disease with heart failure: Secondary | ICD-10-CM | POA: Diagnosis not present

## 2022-12-29 DIAGNOSIS — K219 Gastro-esophageal reflux disease without esophagitis: Secondary | ICD-10-CM | POA: Diagnosis not present

## 2022-12-29 DIAGNOSIS — M069 Rheumatoid arthritis, unspecified: Secondary | ICD-10-CM | POA: Diagnosis not present

## 2022-12-29 DIAGNOSIS — I739 Peripheral vascular disease, unspecified: Secondary | ICD-10-CM | POA: Diagnosis not present

## 2022-12-29 DIAGNOSIS — S0083XD Contusion of other part of head, subsequent encounter: Secondary | ICD-10-CM | POA: Diagnosis not present

## 2022-12-29 DIAGNOSIS — M1990 Unspecified osteoarthritis, unspecified site: Secondary | ICD-10-CM | POA: Diagnosis not present

## 2022-12-29 DIAGNOSIS — Z9181 History of falling: Secondary | ICD-10-CM | POA: Diagnosis not present

## 2022-12-30 DIAGNOSIS — K219 Gastro-esophageal reflux disease without esophagitis: Secondary | ICD-10-CM | POA: Diagnosis not present

## 2022-12-30 DIAGNOSIS — R911 Solitary pulmonary nodule: Secondary | ICD-10-CM | POA: Diagnosis not present

## 2022-12-30 DIAGNOSIS — Z9181 History of falling: Secondary | ICD-10-CM | POA: Diagnosis not present

## 2022-12-30 DIAGNOSIS — E876 Hypokalemia: Secondary | ICD-10-CM | POA: Diagnosis not present

## 2022-12-30 DIAGNOSIS — M069 Rheumatoid arthritis, unspecified: Secondary | ICD-10-CM | POA: Diagnosis not present

## 2022-12-30 DIAGNOSIS — H548 Legal blindness, as defined in USA: Secondary | ICD-10-CM | POA: Diagnosis not present

## 2022-12-30 DIAGNOSIS — M9701XD Periprosthetic fracture around internal prosthetic right hip joint, subsequent encounter: Secondary | ICD-10-CM | POA: Diagnosis not present

## 2022-12-30 DIAGNOSIS — I110 Hypertensive heart disease with heart failure: Secondary | ICD-10-CM | POA: Diagnosis not present

## 2022-12-30 DIAGNOSIS — S0083XD Contusion of other part of head, subsequent encounter: Secondary | ICD-10-CM | POA: Diagnosis not present

## 2022-12-30 DIAGNOSIS — I739 Peripheral vascular disease, unspecified: Secondary | ICD-10-CM | POA: Diagnosis not present

## 2022-12-30 DIAGNOSIS — G8929 Other chronic pain: Secondary | ICD-10-CM | POA: Diagnosis not present

## 2022-12-30 DIAGNOSIS — G9341 Metabolic encephalopathy: Secondary | ICD-10-CM | POA: Diagnosis not present

## 2022-12-30 DIAGNOSIS — M1990 Unspecified osteoarthritis, unspecified site: Secondary | ICD-10-CM | POA: Diagnosis not present

## 2022-12-30 DIAGNOSIS — H409 Unspecified glaucoma: Secondary | ICD-10-CM | POA: Diagnosis not present

## 2022-12-30 DIAGNOSIS — Z86010 Personal history of colonic polyps: Secondary | ICD-10-CM | POA: Diagnosis not present

## 2022-12-30 DIAGNOSIS — Z7902 Long term (current) use of antithrombotics/antiplatelets: Secondary | ICD-10-CM | POA: Diagnosis not present

## 2022-12-30 DIAGNOSIS — S72141D Displaced intertrochanteric fracture of right femur, subsequent encounter for closed fracture with routine healing: Secondary | ICD-10-CM | POA: Diagnosis not present

## 2022-12-30 DIAGNOSIS — I5042 Chronic combined systolic (congestive) and diastolic (congestive) heart failure: Secondary | ICD-10-CM | POA: Diagnosis not present

## 2022-12-30 DIAGNOSIS — S32039D Unspecified fracture of third lumbar vertebra, subsequent encounter for fracture with routine healing: Secondary | ICD-10-CM | POA: Diagnosis not present

## 2022-12-30 DIAGNOSIS — Z7952 Long term (current) use of systemic steroids: Secondary | ICD-10-CM | POA: Diagnosis not present

## 2022-12-31 DIAGNOSIS — M1990 Unspecified osteoarthritis, unspecified site: Secondary | ICD-10-CM | POA: Diagnosis not present

## 2022-12-31 DIAGNOSIS — I110 Hypertensive heart disease with heart failure: Secondary | ICD-10-CM | POA: Diagnosis not present

## 2022-12-31 DIAGNOSIS — M9701XD Periprosthetic fracture around internal prosthetic right hip joint, subsequent encounter: Secondary | ICD-10-CM | POA: Diagnosis not present

## 2022-12-31 DIAGNOSIS — R911 Solitary pulmonary nodule: Secondary | ICD-10-CM | POA: Diagnosis not present

## 2022-12-31 DIAGNOSIS — M069 Rheumatoid arthritis, unspecified: Secondary | ICD-10-CM | POA: Diagnosis not present

## 2022-12-31 DIAGNOSIS — H409 Unspecified glaucoma: Secondary | ICD-10-CM | POA: Diagnosis not present

## 2022-12-31 DIAGNOSIS — E876 Hypokalemia: Secondary | ICD-10-CM | POA: Diagnosis not present

## 2022-12-31 DIAGNOSIS — S32039D Unspecified fracture of third lumbar vertebra, subsequent encounter for fracture with routine healing: Secondary | ICD-10-CM | POA: Diagnosis not present

## 2022-12-31 DIAGNOSIS — G9341 Metabolic encephalopathy: Secondary | ICD-10-CM | POA: Diagnosis not present

## 2022-12-31 DIAGNOSIS — Z86010 Personal history of colonic polyps: Secondary | ICD-10-CM | POA: Diagnosis not present

## 2022-12-31 DIAGNOSIS — S0083XD Contusion of other part of head, subsequent encounter: Secondary | ICD-10-CM | POA: Diagnosis not present

## 2022-12-31 DIAGNOSIS — I5042 Chronic combined systolic (congestive) and diastolic (congestive) heart failure: Secondary | ICD-10-CM | POA: Diagnosis not present

## 2022-12-31 DIAGNOSIS — G8929 Other chronic pain: Secondary | ICD-10-CM | POA: Diagnosis not present

## 2022-12-31 DIAGNOSIS — I739 Peripheral vascular disease, unspecified: Secondary | ICD-10-CM | POA: Diagnosis not present

## 2022-12-31 DIAGNOSIS — K219 Gastro-esophageal reflux disease without esophagitis: Secondary | ICD-10-CM | POA: Diagnosis not present

## 2022-12-31 DIAGNOSIS — S72141D Displaced intertrochanteric fracture of right femur, subsequent encounter for closed fracture with routine healing: Secondary | ICD-10-CM | POA: Diagnosis not present

## 2022-12-31 DIAGNOSIS — Z7952 Long term (current) use of systemic steroids: Secondary | ICD-10-CM | POA: Diagnosis not present

## 2022-12-31 DIAGNOSIS — Z7902 Long term (current) use of antithrombotics/antiplatelets: Secondary | ICD-10-CM | POA: Diagnosis not present

## 2022-12-31 DIAGNOSIS — Z9181 History of falling: Secondary | ICD-10-CM | POA: Diagnosis not present

## 2022-12-31 DIAGNOSIS — H548 Legal blindness, as defined in USA: Secondary | ICD-10-CM | POA: Diagnosis not present

## 2023-01-01 DIAGNOSIS — B351 Tinea unguium: Secondary | ICD-10-CM | POA: Diagnosis not present

## 2023-01-01 DIAGNOSIS — L89893 Pressure ulcer of other site, stage 3: Secondary | ICD-10-CM | POA: Diagnosis not present

## 2023-01-01 DIAGNOSIS — L03031 Cellulitis of right toe: Secondary | ICD-10-CM | POA: Diagnosis not present

## 2023-01-01 DIAGNOSIS — M79671 Pain in right foot: Secondary | ICD-10-CM | POA: Diagnosis not present

## 2023-01-01 DIAGNOSIS — M79672 Pain in left foot: Secondary | ICD-10-CM | POA: Diagnosis not present

## 2023-01-05 DIAGNOSIS — S72141D Displaced intertrochanteric fracture of right femur, subsequent encounter for closed fracture with routine healing: Secondary | ICD-10-CM | POA: Diagnosis not present

## 2023-01-05 DIAGNOSIS — R911 Solitary pulmonary nodule: Secondary | ICD-10-CM | POA: Diagnosis not present

## 2023-01-05 DIAGNOSIS — Z86010 Personal history of colonic polyps: Secondary | ICD-10-CM | POA: Diagnosis not present

## 2023-01-05 DIAGNOSIS — M9701XD Periprosthetic fracture around internal prosthetic right hip joint, subsequent encounter: Secondary | ICD-10-CM | POA: Diagnosis not present

## 2023-01-05 DIAGNOSIS — I5042 Chronic combined systolic (congestive) and diastolic (congestive) heart failure: Secondary | ICD-10-CM | POA: Diagnosis not present

## 2023-01-05 DIAGNOSIS — H548 Legal blindness, as defined in USA: Secondary | ICD-10-CM | POA: Diagnosis not present

## 2023-01-05 DIAGNOSIS — S32039D Unspecified fracture of third lumbar vertebra, subsequent encounter for fracture with routine healing: Secondary | ICD-10-CM | POA: Diagnosis not present

## 2023-01-05 DIAGNOSIS — I739 Peripheral vascular disease, unspecified: Secondary | ICD-10-CM | POA: Diagnosis not present

## 2023-01-05 DIAGNOSIS — G8929 Other chronic pain: Secondary | ICD-10-CM | POA: Diagnosis not present

## 2023-01-05 DIAGNOSIS — I110 Hypertensive heart disease with heart failure: Secondary | ICD-10-CM | POA: Diagnosis not present

## 2023-01-05 DIAGNOSIS — E876 Hypokalemia: Secondary | ICD-10-CM | POA: Diagnosis not present

## 2023-01-05 DIAGNOSIS — H409 Unspecified glaucoma: Secondary | ICD-10-CM | POA: Diagnosis not present

## 2023-01-05 DIAGNOSIS — Z7952 Long term (current) use of systemic steroids: Secondary | ICD-10-CM | POA: Diagnosis not present

## 2023-01-05 DIAGNOSIS — M069 Rheumatoid arthritis, unspecified: Secondary | ICD-10-CM | POA: Diagnosis not present

## 2023-01-05 DIAGNOSIS — K219 Gastro-esophageal reflux disease without esophagitis: Secondary | ICD-10-CM | POA: Diagnosis not present

## 2023-01-05 DIAGNOSIS — G9341 Metabolic encephalopathy: Secondary | ICD-10-CM | POA: Diagnosis not present

## 2023-01-05 DIAGNOSIS — Z9181 History of falling: Secondary | ICD-10-CM | POA: Diagnosis not present

## 2023-01-05 DIAGNOSIS — S0083XD Contusion of other part of head, subsequent encounter: Secondary | ICD-10-CM | POA: Diagnosis not present

## 2023-01-05 DIAGNOSIS — Z7902 Long term (current) use of antithrombotics/antiplatelets: Secondary | ICD-10-CM | POA: Diagnosis not present

## 2023-01-05 DIAGNOSIS — M1990 Unspecified osteoarthritis, unspecified site: Secondary | ICD-10-CM | POA: Diagnosis not present

## 2023-01-06 DIAGNOSIS — G9341 Metabolic encephalopathy: Secondary | ICD-10-CM | POA: Diagnosis not present

## 2023-01-06 DIAGNOSIS — Z7952 Long term (current) use of systemic steroids: Secondary | ICD-10-CM | POA: Diagnosis not present

## 2023-01-06 DIAGNOSIS — G8929 Other chronic pain: Secondary | ICD-10-CM | POA: Diagnosis not present

## 2023-01-06 DIAGNOSIS — H548 Legal blindness, as defined in USA: Secondary | ICD-10-CM | POA: Diagnosis not present

## 2023-01-06 DIAGNOSIS — Z7902 Long term (current) use of antithrombotics/antiplatelets: Secondary | ICD-10-CM | POA: Diagnosis not present

## 2023-01-06 DIAGNOSIS — Z86010 Personal history of colonic polyps: Secondary | ICD-10-CM | POA: Diagnosis not present

## 2023-01-06 DIAGNOSIS — K219 Gastro-esophageal reflux disease without esophagitis: Secondary | ICD-10-CM | POA: Diagnosis not present

## 2023-01-06 DIAGNOSIS — S32039D Unspecified fracture of third lumbar vertebra, subsequent encounter for fracture with routine healing: Secondary | ICD-10-CM | POA: Diagnosis not present

## 2023-01-06 DIAGNOSIS — M1990 Unspecified osteoarthritis, unspecified site: Secondary | ICD-10-CM | POA: Diagnosis not present

## 2023-01-06 DIAGNOSIS — H409 Unspecified glaucoma: Secondary | ICD-10-CM | POA: Diagnosis not present

## 2023-01-06 DIAGNOSIS — M069 Rheumatoid arthritis, unspecified: Secondary | ICD-10-CM | POA: Diagnosis not present

## 2023-01-06 DIAGNOSIS — E876 Hypokalemia: Secondary | ICD-10-CM | POA: Diagnosis not present

## 2023-01-06 DIAGNOSIS — I739 Peripheral vascular disease, unspecified: Secondary | ICD-10-CM | POA: Diagnosis not present

## 2023-01-06 DIAGNOSIS — S0083XD Contusion of other part of head, subsequent encounter: Secondary | ICD-10-CM | POA: Diagnosis not present

## 2023-01-06 DIAGNOSIS — S72141D Displaced intertrochanteric fracture of right femur, subsequent encounter for closed fracture with routine healing: Secondary | ICD-10-CM | POA: Diagnosis not present

## 2023-01-06 DIAGNOSIS — I5042 Chronic combined systolic (congestive) and diastolic (congestive) heart failure: Secondary | ICD-10-CM | POA: Diagnosis not present

## 2023-01-06 DIAGNOSIS — M9701XD Periprosthetic fracture around internal prosthetic right hip joint, subsequent encounter: Secondary | ICD-10-CM | POA: Diagnosis not present

## 2023-01-06 DIAGNOSIS — R911 Solitary pulmonary nodule: Secondary | ICD-10-CM | POA: Diagnosis not present

## 2023-01-06 DIAGNOSIS — Z9181 History of falling: Secondary | ICD-10-CM | POA: Diagnosis not present

## 2023-01-06 DIAGNOSIS — I110 Hypertensive heart disease with heart failure: Secondary | ICD-10-CM | POA: Diagnosis not present

## 2023-01-12 DIAGNOSIS — G9341 Metabolic encephalopathy: Secondary | ICD-10-CM | POA: Diagnosis not present

## 2023-01-12 DIAGNOSIS — E876 Hypokalemia: Secondary | ICD-10-CM | POA: Diagnosis not present

## 2023-01-12 DIAGNOSIS — M9701XD Periprosthetic fracture around internal prosthetic right hip joint, subsequent encounter: Secondary | ICD-10-CM | POA: Diagnosis not present

## 2023-01-12 DIAGNOSIS — I739 Peripheral vascular disease, unspecified: Secondary | ICD-10-CM | POA: Diagnosis not present

## 2023-01-12 DIAGNOSIS — M1990 Unspecified osteoarthritis, unspecified site: Secondary | ICD-10-CM | POA: Diagnosis not present

## 2023-01-12 DIAGNOSIS — G8929 Other chronic pain: Secondary | ICD-10-CM | POA: Diagnosis not present

## 2023-01-12 DIAGNOSIS — S72141D Displaced intertrochanteric fracture of right femur, subsequent encounter for closed fracture with routine healing: Secondary | ICD-10-CM | POA: Diagnosis not present

## 2023-01-12 DIAGNOSIS — Z7952 Long term (current) use of systemic steroids: Secondary | ICD-10-CM | POA: Diagnosis not present

## 2023-01-12 DIAGNOSIS — I5042 Chronic combined systolic (congestive) and diastolic (congestive) heart failure: Secondary | ICD-10-CM | POA: Diagnosis not present

## 2023-01-12 DIAGNOSIS — S32039D Unspecified fracture of third lumbar vertebra, subsequent encounter for fracture with routine healing: Secondary | ICD-10-CM | POA: Diagnosis not present

## 2023-01-12 DIAGNOSIS — I110 Hypertensive heart disease with heart failure: Secondary | ICD-10-CM | POA: Diagnosis not present

## 2023-01-12 DIAGNOSIS — H409 Unspecified glaucoma: Secondary | ICD-10-CM | POA: Diagnosis not present

## 2023-01-12 DIAGNOSIS — R911 Solitary pulmonary nodule: Secondary | ICD-10-CM | POA: Diagnosis not present

## 2023-01-12 DIAGNOSIS — K219 Gastro-esophageal reflux disease without esophagitis: Secondary | ICD-10-CM | POA: Diagnosis not present

## 2023-01-12 DIAGNOSIS — Z86010 Personal history of colonic polyps: Secondary | ICD-10-CM | POA: Diagnosis not present

## 2023-01-12 DIAGNOSIS — Z9181 History of falling: Secondary | ICD-10-CM | POA: Diagnosis not present

## 2023-01-12 DIAGNOSIS — Z7902 Long term (current) use of antithrombotics/antiplatelets: Secondary | ICD-10-CM | POA: Diagnosis not present

## 2023-01-12 DIAGNOSIS — H548 Legal blindness, as defined in USA: Secondary | ICD-10-CM | POA: Diagnosis not present

## 2023-01-12 DIAGNOSIS — M069 Rheumatoid arthritis, unspecified: Secondary | ICD-10-CM | POA: Diagnosis not present

## 2023-01-12 DIAGNOSIS — S0083XD Contusion of other part of head, subsequent encounter: Secondary | ICD-10-CM | POA: Diagnosis not present

## 2023-01-13 DIAGNOSIS — M47816 Spondylosis without myelopathy or radiculopathy, lumbar region: Secondary | ICD-10-CM | POA: Diagnosis not present

## 2023-01-13 DIAGNOSIS — G894 Chronic pain syndrome: Secondary | ICD-10-CM | POA: Diagnosis not present

## 2023-01-13 DIAGNOSIS — M0589 Other rheumatoid arthritis with rheumatoid factor of multiple sites: Secondary | ICD-10-CM | POA: Diagnosis not present

## 2023-01-13 DIAGNOSIS — G5621 Lesion of ulnar nerve, right upper limb: Secondary | ICD-10-CM | POA: Diagnosis not present

## 2023-01-14 DIAGNOSIS — Z7902 Long term (current) use of antithrombotics/antiplatelets: Secondary | ICD-10-CM | POA: Diagnosis not present

## 2023-01-14 DIAGNOSIS — I5042 Chronic combined systolic (congestive) and diastolic (congestive) heart failure: Secondary | ICD-10-CM | POA: Diagnosis not present

## 2023-01-14 DIAGNOSIS — Z86010 Personal history of colonic polyps: Secondary | ICD-10-CM | POA: Diagnosis not present

## 2023-01-14 DIAGNOSIS — M1990 Unspecified osteoarthritis, unspecified site: Secondary | ICD-10-CM | POA: Diagnosis not present

## 2023-01-14 DIAGNOSIS — K219 Gastro-esophageal reflux disease without esophagitis: Secondary | ICD-10-CM | POA: Diagnosis not present

## 2023-01-14 DIAGNOSIS — S72141D Displaced intertrochanteric fracture of right femur, subsequent encounter for closed fracture with routine healing: Secondary | ICD-10-CM | POA: Diagnosis not present

## 2023-01-14 DIAGNOSIS — R911 Solitary pulmonary nodule: Secondary | ICD-10-CM | POA: Diagnosis not present

## 2023-01-14 DIAGNOSIS — H409 Unspecified glaucoma: Secondary | ICD-10-CM | POA: Diagnosis not present

## 2023-01-14 DIAGNOSIS — S0083XD Contusion of other part of head, subsequent encounter: Secondary | ICD-10-CM | POA: Diagnosis not present

## 2023-01-14 DIAGNOSIS — Z9181 History of falling: Secondary | ICD-10-CM | POA: Diagnosis not present

## 2023-01-14 DIAGNOSIS — E876 Hypokalemia: Secondary | ICD-10-CM | POA: Diagnosis not present

## 2023-01-14 DIAGNOSIS — G9341 Metabolic encephalopathy: Secondary | ICD-10-CM | POA: Diagnosis not present

## 2023-01-14 DIAGNOSIS — I739 Peripheral vascular disease, unspecified: Secondary | ICD-10-CM | POA: Diagnosis not present

## 2023-01-14 DIAGNOSIS — I110 Hypertensive heart disease with heart failure: Secondary | ICD-10-CM | POA: Diagnosis not present

## 2023-01-14 DIAGNOSIS — S32039D Unspecified fracture of third lumbar vertebra, subsequent encounter for fracture with routine healing: Secondary | ICD-10-CM | POA: Diagnosis not present

## 2023-01-14 DIAGNOSIS — Z7952 Long term (current) use of systemic steroids: Secondary | ICD-10-CM | POA: Diagnosis not present

## 2023-01-14 DIAGNOSIS — G8929 Other chronic pain: Secondary | ICD-10-CM | POA: Diagnosis not present

## 2023-01-14 DIAGNOSIS — M9701XD Periprosthetic fracture around internal prosthetic right hip joint, subsequent encounter: Secondary | ICD-10-CM | POA: Diagnosis not present

## 2023-01-14 DIAGNOSIS — H548 Legal blindness, as defined in USA: Secondary | ICD-10-CM | POA: Diagnosis not present

## 2023-01-14 DIAGNOSIS — M069 Rheumatoid arthritis, unspecified: Secondary | ICD-10-CM | POA: Diagnosis not present

## 2023-01-16 DIAGNOSIS — Z7902 Long term (current) use of antithrombotics/antiplatelets: Secondary | ICD-10-CM | POA: Diagnosis not present

## 2023-01-16 DIAGNOSIS — Z9181 History of falling: Secondary | ICD-10-CM | POA: Diagnosis not present

## 2023-01-16 DIAGNOSIS — R911 Solitary pulmonary nodule: Secondary | ICD-10-CM | POA: Diagnosis not present

## 2023-01-16 DIAGNOSIS — M069 Rheumatoid arthritis, unspecified: Secondary | ICD-10-CM | POA: Diagnosis not present

## 2023-01-16 DIAGNOSIS — S32039D Unspecified fracture of third lumbar vertebra, subsequent encounter for fracture with routine healing: Secondary | ICD-10-CM | POA: Diagnosis not present

## 2023-01-16 DIAGNOSIS — G8929 Other chronic pain: Secondary | ICD-10-CM | POA: Diagnosis not present

## 2023-01-16 DIAGNOSIS — M1990 Unspecified osteoarthritis, unspecified site: Secondary | ICD-10-CM | POA: Diagnosis not present

## 2023-01-16 DIAGNOSIS — I739 Peripheral vascular disease, unspecified: Secondary | ICD-10-CM | POA: Diagnosis not present

## 2023-01-16 DIAGNOSIS — G9341 Metabolic encephalopathy: Secondary | ICD-10-CM | POA: Diagnosis not present

## 2023-01-16 DIAGNOSIS — H548 Legal blindness, as defined in USA: Secondary | ICD-10-CM | POA: Diagnosis not present

## 2023-01-16 DIAGNOSIS — Z7952 Long term (current) use of systemic steroids: Secondary | ICD-10-CM | POA: Diagnosis not present

## 2023-01-16 DIAGNOSIS — S0083XD Contusion of other part of head, subsequent encounter: Secondary | ICD-10-CM | POA: Diagnosis not present

## 2023-01-16 DIAGNOSIS — H409 Unspecified glaucoma: Secondary | ICD-10-CM | POA: Diagnosis not present

## 2023-01-16 DIAGNOSIS — Z86010 Personal history of colonic polyps: Secondary | ICD-10-CM | POA: Diagnosis not present

## 2023-01-16 DIAGNOSIS — M9701XD Periprosthetic fracture around internal prosthetic right hip joint, subsequent encounter: Secondary | ICD-10-CM | POA: Diagnosis not present

## 2023-01-16 DIAGNOSIS — E876 Hypokalemia: Secondary | ICD-10-CM | POA: Diagnosis not present

## 2023-01-16 DIAGNOSIS — S72141D Displaced intertrochanteric fracture of right femur, subsequent encounter for closed fracture with routine healing: Secondary | ICD-10-CM | POA: Diagnosis not present

## 2023-01-16 DIAGNOSIS — K219 Gastro-esophageal reflux disease without esophagitis: Secondary | ICD-10-CM | POA: Diagnosis not present

## 2023-01-16 DIAGNOSIS — I5042 Chronic combined systolic (congestive) and diastolic (congestive) heart failure: Secondary | ICD-10-CM | POA: Diagnosis not present

## 2023-01-16 DIAGNOSIS — I110 Hypertensive heart disease with heart failure: Secondary | ICD-10-CM | POA: Diagnosis not present

## 2023-01-19 DIAGNOSIS — G8929 Other chronic pain: Secondary | ICD-10-CM | POA: Diagnosis not present

## 2023-01-19 DIAGNOSIS — Z7952 Long term (current) use of systemic steroids: Secondary | ICD-10-CM | POA: Diagnosis not present

## 2023-01-19 DIAGNOSIS — S72141D Displaced intertrochanteric fracture of right femur, subsequent encounter for closed fracture with routine healing: Secondary | ICD-10-CM | POA: Diagnosis not present

## 2023-01-19 DIAGNOSIS — S32039D Unspecified fracture of third lumbar vertebra, subsequent encounter for fracture with routine healing: Secondary | ICD-10-CM | POA: Diagnosis not present

## 2023-01-19 DIAGNOSIS — R911 Solitary pulmonary nodule: Secondary | ICD-10-CM | POA: Diagnosis not present

## 2023-01-19 DIAGNOSIS — Z7902 Long term (current) use of antithrombotics/antiplatelets: Secondary | ICD-10-CM | POA: Diagnosis not present

## 2023-01-19 DIAGNOSIS — K219 Gastro-esophageal reflux disease without esophagitis: Secondary | ICD-10-CM | POA: Diagnosis not present

## 2023-01-19 DIAGNOSIS — H548 Legal blindness, as defined in USA: Secondary | ICD-10-CM | POA: Diagnosis not present

## 2023-01-19 DIAGNOSIS — S0083XD Contusion of other part of head, subsequent encounter: Secondary | ICD-10-CM | POA: Diagnosis not present

## 2023-01-19 DIAGNOSIS — E876 Hypokalemia: Secondary | ICD-10-CM | POA: Diagnosis not present

## 2023-01-19 DIAGNOSIS — M9701XD Periprosthetic fracture around internal prosthetic right hip joint, subsequent encounter: Secondary | ICD-10-CM | POA: Diagnosis not present

## 2023-01-19 DIAGNOSIS — M069 Rheumatoid arthritis, unspecified: Secondary | ICD-10-CM | POA: Diagnosis not present

## 2023-01-19 DIAGNOSIS — H409 Unspecified glaucoma: Secondary | ICD-10-CM | POA: Diagnosis not present

## 2023-01-19 DIAGNOSIS — I110 Hypertensive heart disease with heart failure: Secondary | ICD-10-CM | POA: Diagnosis not present

## 2023-01-19 DIAGNOSIS — I5042 Chronic combined systolic (congestive) and diastolic (congestive) heart failure: Secondary | ICD-10-CM | POA: Diagnosis not present

## 2023-01-19 DIAGNOSIS — I739 Peripheral vascular disease, unspecified: Secondary | ICD-10-CM | POA: Diagnosis not present

## 2023-01-19 DIAGNOSIS — M1990 Unspecified osteoarthritis, unspecified site: Secondary | ICD-10-CM | POA: Diagnosis not present

## 2023-01-19 DIAGNOSIS — G9341 Metabolic encephalopathy: Secondary | ICD-10-CM | POA: Diagnosis not present

## 2023-01-19 DIAGNOSIS — Z86010 Personal history of colonic polyps: Secondary | ICD-10-CM | POA: Diagnosis not present

## 2023-01-19 DIAGNOSIS — Z9181 History of falling: Secondary | ICD-10-CM | POA: Diagnosis not present

## 2023-01-20 ENCOUNTER — Emergency Department (HOSPITAL_COMMUNITY): Payer: Medicare Other

## 2023-01-20 ENCOUNTER — Encounter (HOSPITAL_COMMUNITY): Payer: Self-pay

## 2023-01-20 ENCOUNTER — Emergency Department (HOSPITAL_COMMUNITY)
Admission: EM | Admit: 2023-01-20 | Discharge: 2023-01-20 | Disposition: A | Discharge status: Home / Self Care | Payer: Medicare Other | Attending: Emergency Medicine | Admitting: Emergency Medicine

## 2023-01-20 DIAGNOSIS — G8929 Other chronic pain: Secondary | ICD-10-CM | POA: Diagnosis present

## 2023-01-20 DIAGNOSIS — R4182 Altered mental status, unspecified: Secondary | ICD-10-CM | POA: Insufficient documentation

## 2023-01-20 DIAGNOSIS — Z743 Need for continuous supervision: Secondary | ICD-10-CM | POA: Diagnosis not present

## 2023-01-20 DIAGNOSIS — S0003XA Contusion of scalp, initial encounter: Secondary | ICD-10-CM | POA: Diagnosis not present

## 2023-01-20 DIAGNOSIS — D539 Nutritional anemia, unspecified: Secondary | ICD-10-CM | POA: Diagnosis present

## 2023-01-20 DIAGNOSIS — I6381 Other cerebral infarction due to occlusion or stenosis of small artery: Secondary | ICD-10-CM | POA: Diagnosis not present

## 2023-01-20 DIAGNOSIS — I10 Essential (primary) hypertension: Secondary | ICD-10-CM | POA: Diagnosis present

## 2023-01-20 DIAGNOSIS — Z79899 Other long term (current) drug therapy: Secondary | ICD-10-CM | POA: Diagnosis not present

## 2023-01-20 DIAGNOSIS — I509 Heart failure, unspecified: Secondary | ICD-10-CM | POA: Insufficient documentation

## 2023-01-20 DIAGNOSIS — N179 Acute kidney failure, unspecified: Secondary | ICD-10-CM | POA: Diagnosis not present

## 2023-01-20 DIAGNOSIS — Z20822 Contact with and (suspected) exposure to covid-19: Secondary | ICD-10-CM | POA: Diagnosis not present

## 2023-01-20 DIAGNOSIS — R509 Fever, unspecified: Secondary | ICD-10-CM | POA: Diagnosis not present

## 2023-01-20 DIAGNOSIS — J811 Chronic pulmonary edema: Secondary | ICD-10-CM | POA: Diagnosis not present

## 2023-01-20 DIAGNOSIS — M069 Rheumatoid arthritis, unspecified: Secondary | ICD-10-CM | POA: Diagnosis present

## 2023-01-20 DIAGNOSIS — R6889 Other general symptoms and signs: Secondary | ICD-10-CM | POA: Diagnosis not present

## 2023-01-20 DIAGNOSIS — R404 Transient alteration of awareness: Secondary | ICD-10-CM | POA: Diagnosis not present

## 2023-01-20 DIAGNOSIS — I739 Peripheral vascular disease, unspecified: Secondary | ICD-10-CM | POA: Diagnosis present

## 2023-01-20 DIAGNOSIS — I110 Hypertensive heart disease with heart failure: Secondary | ICD-10-CM | POA: Diagnosis not present

## 2023-01-20 DIAGNOSIS — R531 Weakness: Secondary | ICD-10-CM | POA: Insufficient documentation

## 2023-01-20 DIAGNOSIS — I499 Cardiac arrhythmia, unspecified: Secondary | ICD-10-CM | POA: Diagnosis not present

## 2023-01-20 DIAGNOSIS — I5042 Chronic combined systolic (congestive) and diastolic (congestive) heart failure: Secondary | ICD-10-CM | POA: Diagnosis present

## 2023-01-20 LAB — RAPID URINE DRUG SCREEN, HOSP PERFORMED
Amphetamines: NOT DETECTED
Barbiturates: NOT DETECTED
Benzodiazepines: NOT DETECTED
Cocaine: NOT DETECTED
Opiates: POSITIVE — AB
Tetrahydrocannabinol: NOT DETECTED

## 2023-01-20 LAB — CBC
HCT: 43.1 % (ref 39.0–52.0)
Hemoglobin: 13.9 g/dL (ref 13.0–17.0)
MCH: 33.1 pg (ref 26.0–34.0)
MCHC: 32.3 g/dL (ref 30.0–36.0)
MCV: 102.6 fL — ABNORMAL HIGH (ref 80.0–100.0)
Platelets: 146 10*3/uL — ABNORMAL LOW (ref 150–400)
RBC: 4.2 MIL/uL — ABNORMAL LOW (ref 4.22–5.81)
RDW: 13.6 % (ref 11.5–15.5)
WBC: 7.5 10*3/uL (ref 4.0–10.5)
nRBC: 0 % (ref 0.0–0.2)

## 2023-01-20 LAB — RESP PANEL BY RT-PCR (RSV, FLU A&B, COVID)  RVPGX2
Influenza A by PCR: NEGATIVE
Influenza B by PCR: NEGATIVE
Resp Syncytial Virus by PCR: NEGATIVE
SARS Coronavirus 2 by RT PCR: NEGATIVE

## 2023-01-20 LAB — URINALYSIS, ROUTINE W REFLEX MICROSCOPIC
Bilirubin Urine: NEGATIVE
Glucose, UA: NEGATIVE mg/dL
Hgb urine dipstick: NEGATIVE
Ketones, ur: NEGATIVE mg/dL
Leukocytes,Ua: NEGATIVE
Nitrite: NEGATIVE
Protein, ur: NEGATIVE mg/dL
Specific Gravity, Urine: 1.013 (ref 1.005–1.030)
pH: 6 (ref 5.0–8.0)

## 2023-01-20 LAB — BASIC METABOLIC PANEL
Anion gap: 9 (ref 5–15)
BUN: 16 mg/dL (ref 8–23)
CO2: 25 mmol/L (ref 22–32)
Calcium: 9.1 mg/dL (ref 8.9–10.3)
Chloride: 109 mmol/L (ref 98–111)
Creatinine, Ser: 1.56 mg/dL — ABNORMAL HIGH (ref 0.61–1.24)
GFR, Estimated: 43 mL/min — ABNORMAL LOW (ref >60)
Glucose, Bld: 77 mg/dL (ref 70–99)
Potassium: 3.7 mmol/L (ref 3.5–5.1)
Sodium: 143 mmol/L (ref 135–145)

## 2023-01-20 LAB — CBG MONITORING, ED: Glucose-Capillary: 80 mg/dL (ref 70–99)

## 2023-01-20 MED ORDER — SODIUM CHLORIDE 0.9 % IV BOLUS
1000.0000 mL | Freq: Once | INTRAVENOUS | Status: AC
  Administered 2023-01-20: 1000 mL via INTRAVENOUS

## 2023-01-20 NOTE — ED Notes (Signed)
@   13:16 pt sated at [place] RN she checked on PT. @ 13:28 pt sating at [facility] to check the SPO2 on pt.

## 2023-01-20 NOTE — Discharge Instructions (Signed)
Please follow-up closely with your doctor for further care.  Return to the ER if you have any concern.  Please drink plenty of fluid as you are dehydrated.

## 2023-01-20 NOTE — ED Provider Notes (Signed)
[facility] Provider Note   CSN: [id number] Arrival date & time: 01/20/23  1031     History  Chief Complaint  Patient presents with   Weakness    Nathaniel [name] is a 88 y.o. male.  The history is provided by the patient, the EMS personnel, the spouse and medical records. No language interpreter was used.  Weakness    88 year old male significant history of hypertension, chronic pain, CHF, PAD on Plavix, brought here via EMS from home for evaluation of weakness and altered mental status.  Per EMS note, patient's wife noticed patient has been sleeping more than usual and having been himself.  I was able to obtain much history from patient, level 5 caveat's applied.  Patient is without any complaint of any pain or trouble breathing he does not endorse any abdominal pain no urinary symptoms.  He denies any headache cough.  Initial EMS report an oral temperature of 100.  Home Medications Prior to Admission medications   Medication Sig Start Date End Date Taking? Authorizing Provider  ACTEMRA ACTPEN 162 MG/0.9ML SOAJ Inject 162 mg into the skin every Thursday. 11/12/21   [provider]  amLODipine (NORVASC) 10 MG tablet Take 1 tablet (10 mg total) by mouth daily. 02/17/22   Thurnell Lose, MD  atorvastatin (LIPITOR) 20 MG tablet Take 1 tablet (20 mg total) by mouth daily. 10/02/22   Geradine Girt, DO  brimonidine (ALPHAGAN) 0.2 % ophthalmic solution Place 1 drop into both eyes in the morning and at bedtime. 11/04/21   [provider]  carvedilol (COREG) 3.125 MG tablet Take 1 tablet (3.125 mg total) by mouth 2 (two) times daily with a meal. 10/01/22   Geradine Girt, DO  cetirizine (ZYRTEC) 10 MG tablet Take 10 mg by mouth daily.    [provider]  clopidogrel (PLAVIX) 75 MG tablet Take 1 tablet (75 mg total) by mouth daily. 02/19/22   Thurnell Lose, MD  diclofenac sodium (VOLTAREN) 1 % GEL Apply 1 application.  topically in the morning and at bedtime. Left knee and both feet 04/15/18   [provider]  dorzolamide (TRUSOPT) 2 % ophthalmic solution Place 1 drop into both eyes 2 (two) times daily. 01/03/22   [provider]  ferrous sulfate 325 (65 FE) MG tablet Take 325 mg by mouth daily with breakfast.    [provider]  folic acid (FOLVITE) 1 MG tablet Take 1 mg by mouth at bedtime.     [provider]  HYDROcodone-acetaminophen (NORCO/VICODIN) 5-325 MG tablet Take 1-2 tablets by mouth every 6 (six) hours as needed for severe pain. 10/01/22   Geradine Girt, DO  losartan (COZAAR) 50 MG tablet Take 1 tablet (50 mg total) by mouth daily. 10/02/22   Geradine Girt, DO  methotrexate 50 MG/2ML injection Inject 25 mg into the skin every Wednesday.    [provider]  Omega-3 Fatty Acids (FISH OIL) 1000 MG CPDR Take 1,000 mg by mouth daily.    [provider]  pantoprazole (PROTONIX) 40 MG tablet Take 1 tablet (40 mg total) by mouth daily. 10/02/22   Geradine Girt, DO  polyethylene glycol (MIRALAX / GLYCOLAX) 17 g packet Take 17 g by mouth at bedtime.    [provider]  predniSONE (DELTASONE) 5 MG tablet Take 5 mg by mouth in the morning and at bedtime. 07/23/22   [provider]  sulfaSALAzine (AZULFIDINE) 500 MG tablet  Take 1,000 mg by mouth 2 (two) times daily with breakfast and lunch. 01/03/22   [provider]  tamsulosin (FLOMAX) 0.4 MG CAPS capsule Take 0.4 mg by mouth at bedtime. 09/19/22   [provider]  traZODone (DESYREL) 150 MG tablet Take 150 mg by mouth at bedtime. 11/28/21   [provider]  TURMERIC PO Take 1 capsule by mouth daily.    [provider]  vitamin B-12 (CYANOCOBALAMIN) 1000 MCG tablet Take 1,000 mcg by mouth daily.    [provider]  vitamin C (ASCORBIC ACID) 500 MG tablet Take 500 mg by mouth daily.    [provider]      Allergies    Ibuprofen and  Tylenol [acetaminophen]    Review of Systems   Review of Systems  Neurological:  Positive for weakness.  All other systems reviewed and are negative.   Physical Exam Updated Vital Signs BP (!) 168/105   Pulse 62   Temp 97.7 F (36.5 C) (Rectal)   Resp 20   SpO2 97%  Physical Exam Vitals and nursing note reviewed.  Constitutional:      General: He is not in acute distress.    Appearance: He is well-developed.     Comments: Drowsy and sleepy but arousable and in no acute discomfort.  HENT:     Head: Normocephalic and atraumatic.  Eyes:     Comments: Pupil 2 mm right eye and reactive.  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  Cardiovascular:     Rate and Rhythm: Normal rate and regular rhythm.     Pulses: Normal pulses.     Heart sounds: Normal heart sounds.  Pulmonary:     Effort: Pulmonary effort is normal.     Comments: Diminished breath sounds without wheezes rales or rhonchi Abdominal:     Palpations: Abdomen is soft.     Tenderness: There is no abdominal tenderness.  Genitourinary:    Comments: Chaperone present during exam.  Normal rectal tone, patient has several decubitus ulcer to sacral region without signs of infection. Musculoskeletal:     Cervical back: Normal range of motion and neck supple.     Comments: Global weakness but follow command.  Does not seem to be able to lift up his right leg compared to left.  Skin:    Findings: No rash.  Neurological:     Mental Status: He is disoriented.     ED Results / Procedures / Treatments   Labs (all labs ordered are listed, but only abnormal results are displayed) Labs Reviewed  BASIC METABOLIC PANEL - Abnormal; Notable for the following components:      Result Value   Creatinine, Ser 1.56 (*)    GFR, Estimated 43 (*)    All other components within normal limits  CBC - Abnormal; Notable for the following components:   RBC 4.20 (*)    MCV 102.6 (*)    Platelets 146 (*)    All other components  within normal limits  RAPID URINE DRUG SCREEN, HOSP PERFORMED - Abnormal; Notable for the following components:   Opiates POSITIVE (*)    All other components within normal limits  RESP PANEL BY RT-PCR (RSV, FLU A&B, COVID)  [id number]  URINALYSIS, ROUTINE W REFLEX MICROSCOPIC  CBG MONITORING, ED    EKG None  Date: 01/20/2023  Rate: 66  Rhythm: normal sinus rhythm  QRS Axis: normal  Intervals: normal  ST/T Wave abnormalities: normal  Conduction Disutrbances: none  Narrative Interpretation:   Old EKG Reviewed: No significant changes noted    Radiology CT Head Wo Contrast  Result Date: 01/20/2023 CLINICAL DATA:  88 year old male with altered mental status. EXAM: CT HEAD WITHOUT CONTRAST TECHNIQUE: Contiguous axial images were obtained from the base of the skull through the vertex without intravenous contrast. RADIATION DOSE REDUCTION: This exam was performed according to the departmental dose-optimization program which includes automated exposure control, adjustment of the mA and/or kV according to patient size and/or use of iterative reconstruction technique. COMPARISON:  Brain MRI 09/29/2022.  Head CT 09/30/2022. FINDINGS: Brain: Stable cerebral volume. Expected evolution of the small posterior left centrum semiovale lacunar infarct since November (series 3, image 24). No midline shift, ventriculomegaly, mass effect, evidence of mass lesion, intracranial hemorrhage or evidence of cortically based acute infarction. 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 And stable asymmetric white matter hypodensity near the left frontal horn. Vascular: Calcified atherosclerosis at the skull base. No suspicious intracranial vascular hyperdensity. Skull: No acute osseous abnormality identified. Sinuses/Orbits: Visualized paranasal sinuses and mastoids are stable and well aerated. Other: Postoperative changes to both globes greater on the left. Mostly resolved  right forehead and periorbital scalp hematoma demonstrated in November. IMPRESSION: 1. No acute intracranial abnormality. 2. Stable noncontrast CT appearance of cerebral white matter and cerebellar ischemia. 3. Regressed, mostly resolved right forehead and periorbital scalp hematoma since November. Electronically Signed   By: Genevie Ann M.D.   On: 01/20/2023 12:28   DG Chest Portable 1 View  Result Date: 01/20/2023 CLINICAL DATA:  Weakness EXAM: PORTABLE CHEST 1 VIEW COMPARISON:  09/30/2022 FINDINGS: Pulmonary vascular congestion without focal consolidation. No pneumothorax or pleural effusion. Unremarkable cardiac silhouette. IMPRESSION: Pulmonary vascular congestion.  No focal consolidation. Electronically Signed   By: Sammie Bench M.D.   On: 01/20/2023 12:08    Procedures Procedures    Medications Ordered in ED Medications  sodium chloride 0.9 % bolus 1,000 mL (1,000 mLs Intravenous New Bag/Given 01/20/23 1318)    ED Course/ Medical Decision Making/ A&P                             Medical Decision Making Amount and/or Complexity of Data Reviewed Labs: ordered. Radiology: ordered.   BP (!) 153/68   Pulse (!) 58   Temp 97.7 F (36.5 C) (Rectal)   Resp 15   SpO2 99%   73:29 AM  88 year old male significant history of hypertension, chronic pain, CHF, PAD on Plavix, brought here via EMS from home for evaluation of weakness and altered mental status.  Per EMS note, patient's wife noticed patient has been sleeping more than usual and having been himself.  I was able to obtain much history from patient, level 5 caveat's applied.  Patient is without any complaint of any pain or trouble breathing he does not endorse any abdominal pain no urinary symptoms.  He denies any headache cough.  Initial EMS report an oral temperature of 100.  On exam, patient is laying in bed, sleepy but arousable and appears to be in no acute discomfort.  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  Mouth is dry.  No nuchal rigidity.  Heart with normal rate and rhythm, lungs diminished without wheezes rales or rhonchi.  Abdomen is soft nontender.  Patient has several sacral decubitus ulcers with dressing in place and does not  appear to be infected.  He also has pressure ulcers to his left heel with dressing in place as well.  Pressure ulcer to R 2nd toe. He has global weakness with more pronounced weakness to his right leg compared to left.  He is alert and oriented x 1.  Rectal temp is 97.7.  Workup initiated.  -Labs ordered, independently viewed and interpreted by me.  Labs remarkable for UDS positive for opiate. Pt does take chronic opiate for his arthritis.  UA without UTI, negative covid/flu/rsv, new AKI with Cr. 1.56 will give IVF.  Normal WBC, normal H&H -The patient was maintained on a cardiac monitor.  I personally viewed and interpreted the cardiac monitored which showed an underlying rhythm of: NSR -Imaging independently viewed and interpreted by me and I agree with radiologist's interpretation.  Result remarkable for CXR showing pulmonary vascular congestion without focal consolidation.  -This patient presents to the ED for concern of weakness, this involves an extensive number of treatment options, and is a complaint that carries with it a high risk of complications and morbidity.  The differential diagnosis includes stroke, UTI, pneumonia, electrolytes derangement, infection, depression, dementia, opiate induced -Co morbidities that complicate the patient evaluation includes prior stroke, anemia, HTN, CHF -Treatment includes IVF -Reevaluation of the patient after these medicines showed that the patient improved -PCP office notes or outside notes reviewed -Discussion with attending Dr. Alvino Chapel -Escalation to admission/observation considered: patients feels much better, is comfortable with discharge, and will follow up with PCP -Prescription medication considered,  patient comfortable with home medication -Social Determinant of Health considered   2:04 PM Workup today remarkable for evidence of AKI with a creatinine of 1.57 which is worse than his baseline.  Since patient was having some trouble with confusion and weakness, I have ordered a brain MRI.  However while waiting for MRI, patient appears to be back to his baseline according to wife. She request for patient to be discharged home.  MRI have not been performed yet.  However on reassessment patient appears alert and oriented and does not exhibit any focal neurodeficit.         Final Clinical Impression(s) / ED Diagnoses Final diagnoses:  Weakness  AKI (acute kidney injury) [facility])    Rx / DC Orders ED Discharge Orders     None         Domenic Moras, PA-C 01/20/23 1606    Davonna Belling, MD 01/22/23 1419

## 2023-01-20 NOTE — ED Triage Notes (Signed)
Pt BIB [facility] for c/o weakness and AMS. Wife states that he has been sleeping more than usual and has not been himself. Pt is responsive to voice, follows commands.

## 2023-01-22 DIAGNOSIS — M1990 Unspecified osteoarthritis, unspecified site: Secondary | ICD-10-CM | POA: Diagnosis not present

## 2023-01-22 DIAGNOSIS — R911 Solitary pulmonary nodule: Secondary | ICD-10-CM | POA: Diagnosis not present

## 2023-01-22 DIAGNOSIS — G9341 Metabolic encephalopathy: Secondary | ICD-10-CM | POA: Diagnosis not present

## 2023-01-22 DIAGNOSIS — M9701XD Periprosthetic fracture around internal prosthetic right hip joint, subsequent encounter: Secondary | ICD-10-CM | POA: Diagnosis not present

## 2023-01-22 DIAGNOSIS — Z7952 Long term (current) use of systemic steroids: Secondary | ICD-10-CM | POA: Diagnosis not present

## 2023-01-22 DIAGNOSIS — M069 Rheumatoid arthritis, unspecified: Secondary | ICD-10-CM | POA: Diagnosis not present

## 2023-01-22 DIAGNOSIS — Z7902 Long term (current) use of antithrombotics/antiplatelets: Secondary | ICD-10-CM | POA: Diagnosis not present

## 2023-01-22 DIAGNOSIS — S72141D Displaced intertrochanteric fracture of right femur, subsequent encounter for closed fracture with routine healing: Secondary | ICD-10-CM | POA: Diagnosis not present

## 2023-01-22 DIAGNOSIS — H548 Legal blindness, as defined in USA: Secondary | ICD-10-CM | POA: Diagnosis not present

## 2023-01-22 DIAGNOSIS — S0083XD Contusion of other part of head, subsequent encounter: Secondary | ICD-10-CM | POA: Diagnosis not present

## 2023-01-22 DIAGNOSIS — G8929 Other chronic pain: Secondary | ICD-10-CM | POA: Diagnosis not present

## 2023-01-22 DIAGNOSIS — Z9181 History of falling: Secondary | ICD-10-CM | POA: Diagnosis not present

## 2023-01-22 DIAGNOSIS — Z86010 Personal history of colonic polyps: Secondary | ICD-10-CM | POA: Diagnosis not present

## 2023-01-22 DIAGNOSIS — S32039D Unspecified fracture of third lumbar vertebra, subsequent encounter for fracture with routine healing: Secondary | ICD-10-CM | POA: Diagnosis not present

## 2023-01-22 DIAGNOSIS — I110 Hypertensive heart disease with heart failure: Secondary | ICD-10-CM | POA: Diagnosis not present

## 2023-01-22 DIAGNOSIS — E876 Hypokalemia: Secondary | ICD-10-CM | POA: Diagnosis not present

## 2023-01-22 DIAGNOSIS — H409 Unspecified glaucoma: Secondary | ICD-10-CM | POA: Diagnosis not present

## 2023-01-22 DIAGNOSIS — I5042 Chronic combined systolic (congestive) and diastolic (congestive) heart failure: Secondary | ICD-10-CM | POA: Diagnosis not present

## 2023-01-22 DIAGNOSIS — M5416 Radiculopathy, lumbar region: Secondary | ICD-10-CM | POA: Diagnosis not present

## 2023-01-22 DIAGNOSIS — K219 Gastro-esophageal reflux disease without esophagitis: Secondary | ICD-10-CM | POA: Diagnosis not present

## 2023-01-22 DIAGNOSIS — I739 Peripheral vascular disease, unspecified: Secondary | ICD-10-CM | POA: Diagnosis not present

## 2023-01-22 DIAGNOSIS — M48061 Spinal stenosis, lumbar region without neurogenic claudication: Secondary | ICD-10-CM | POA: Diagnosis not present

## 2023-01-22 DIAGNOSIS — M5441 Lumbago with sciatica, right side: Secondary | ICD-10-CM | POA: Diagnosis not present

## 2023-01-26 DIAGNOSIS — S0083XD Contusion of other part of head, subsequent encounter: Secondary | ICD-10-CM | POA: Diagnosis not present

## 2023-01-26 DIAGNOSIS — G8929 Other chronic pain: Secondary | ICD-10-CM | POA: Diagnosis not present

## 2023-01-26 DIAGNOSIS — I110 Hypertensive heart disease with heart failure: Secondary | ICD-10-CM | POA: Diagnosis not present

## 2023-01-26 DIAGNOSIS — G9341 Metabolic encephalopathy: Secondary | ICD-10-CM | POA: Diagnosis not present

## 2023-01-26 DIAGNOSIS — I5042 Chronic combined systolic (congestive) and diastolic (congestive) heart failure: Secondary | ICD-10-CM | POA: Diagnosis not present

## 2023-01-26 DIAGNOSIS — Z7952 Long term (current) use of systemic steroids: Secondary | ICD-10-CM | POA: Diagnosis not present

## 2023-01-26 DIAGNOSIS — S32039D Unspecified fracture of third lumbar vertebra, subsequent encounter for fracture with routine healing: Secondary | ICD-10-CM | POA: Diagnosis not present

## 2023-01-26 DIAGNOSIS — Z7902 Long term (current) use of antithrombotics/antiplatelets: Secondary | ICD-10-CM | POA: Diagnosis not present

## 2023-01-26 DIAGNOSIS — E876 Hypokalemia: Secondary | ICD-10-CM | POA: Diagnosis not present

## 2023-01-26 DIAGNOSIS — R911 Solitary pulmonary nodule: Secondary | ICD-10-CM | POA: Diagnosis not present

## 2023-01-26 DIAGNOSIS — H409 Unspecified glaucoma: Secondary | ICD-10-CM | POA: Diagnosis not present

## 2023-01-26 DIAGNOSIS — M9701XD Periprosthetic fracture around internal prosthetic right hip joint, subsequent encounter: Secondary | ICD-10-CM | POA: Diagnosis not present

## 2023-01-26 DIAGNOSIS — M1990 Unspecified osteoarthritis, unspecified site: Secondary | ICD-10-CM | POA: Diagnosis not present

## 2023-01-26 DIAGNOSIS — S72141D Displaced intertrochanteric fracture of right femur, subsequent encounter for closed fracture with routine healing: Secondary | ICD-10-CM | POA: Diagnosis not present

## 2023-01-26 DIAGNOSIS — K219 Gastro-esophageal reflux disease without esophagitis: Secondary | ICD-10-CM | POA: Diagnosis not present

## 2023-01-26 DIAGNOSIS — M069 Rheumatoid arthritis, unspecified: Secondary | ICD-10-CM | POA: Diagnosis not present

## 2023-01-26 DIAGNOSIS — H548 Legal blindness, as defined in USA: Secondary | ICD-10-CM | POA: Diagnosis not present

## 2023-01-26 DIAGNOSIS — Z86010 Personal history of colonic polyps: Secondary | ICD-10-CM | POA: Diagnosis not present

## 2023-01-26 DIAGNOSIS — I739 Peripheral vascular disease, unspecified: Secondary | ICD-10-CM | POA: Diagnosis not present

## 2023-01-26 DIAGNOSIS — Z9181 History of falling: Secondary | ICD-10-CM | POA: Diagnosis not present

## 2023-01-27 DIAGNOSIS — I739 Peripheral vascular disease, unspecified: Secondary | ICD-10-CM | POA: Diagnosis not present

## 2023-01-27 DIAGNOSIS — S32039D Unspecified fracture of third lumbar vertebra, subsequent encounter for fracture with routine healing: Secondary | ICD-10-CM | POA: Diagnosis not present

## 2023-01-27 DIAGNOSIS — R911 Solitary pulmonary nodule: Secondary | ICD-10-CM | POA: Diagnosis not present

## 2023-01-27 DIAGNOSIS — G9341 Metabolic encephalopathy: Secondary | ICD-10-CM | POA: Diagnosis not present

## 2023-01-27 DIAGNOSIS — Z7952 Long term (current) use of systemic steroids: Secondary | ICD-10-CM | POA: Diagnosis not present

## 2023-01-27 DIAGNOSIS — M9701XD Periprosthetic fracture around internal prosthetic right hip joint, subsequent encounter: Secondary | ICD-10-CM | POA: Diagnosis not present

## 2023-01-27 DIAGNOSIS — S0083XD Contusion of other part of head, subsequent encounter: Secondary | ICD-10-CM | POA: Diagnosis not present

## 2023-01-27 DIAGNOSIS — I110 Hypertensive heart disease with heart failure: Secondary | ICD-10-CM | POA: Diagnosis not present

## 2023-01-27 DIAGNOSIS — S72141D Displaced intertrochanteric fracture of right femur, subsequent encounter for closed fracture with routine healing: Secondary | ICD-10-CM | POA: Diagnosis not present

## 2023-01-27 DIAGNOSIS — G8929 Other chronic pain: Secondary | ICD-10-CM | POA: Diagnosis not present

## 2023-01-27 DIAGNOSIS — M1990 Unspecified osteoarthritis, unspecified site: Secondary | ICD-10-CM | POA: Diagnosis not present

## 2023-01-27 DIAGNOSIS — Z9181 History of falling: Secondary | ICD-10-CM | POA: Diagnosis not present

## 2023-01-27 DIAGNOSIS — H548 Legal blindness, as defined in USA: Secondary | ICD-10-CM | POA: Diagnosis not present

## 2023-01-27 DIAGNOSIS — Z7902 Long term (current) use of antithrombotics/antiplatelets: Secondary | ICD-10-CM | POA: Diagnosis not present

## 2023-01-27 DIAGNOSIS — H409 Unspecified glaucoma: Secondary | ICD-10-CM | POA: Diagnosis not present

## 2023-01-27 DIAGNOSIS — Z86010 Personal history of colonic polyps: Secondary | ICD-10-CM | POA: Diagnosis not present

## 2023-01-27 DIAGNOSIS — K219 Gastro-esophageal reflux disease without esophagitis: Secondary | ICD-10-CM | POA: Diagnosis not present

## 2023-01-27 DIAGNOSIS — E876 Hypokalemia: Secondary | ICD-10-CM | POA: Diagnosis not present

## 2023-01-27 DIAGNOSIS — I5042 Chronic combined systolic (congestive) and diastolic (congestive) heart failure: Secondary | ICD-10-CM | POA: Diagnosis not present

## 2023-01-27 DIAGNOSIS — M069 Rheumatoid arthritis, unspecified: Secondary | ICD-10-CM | POA: Diagnosis not present

## 2023-01-28 DIAGNOSIS — G9341 Metabolic encephalopathy: Secondary | ICD-10-CM | POA: Diagnosis not present

## 2023-01-28 DIAGNOSIS — E870 Hyperosmolality and hypernatremia: Secondary | ICD-10-CM | POA: Diagnosis not present

## 2023-01-28 DIAGNOSIS — G8929 Other chronic pain: Secondary | ICD-10-CM | POA: Diagnosis not present

## 2023-01-28 DIAGNOSIS — R000 Tachycardia, unspecified: Secondary | ICD-10-CM | POA: Diagnosis not present

## 2023-01-28 DIAGNOSIS — I5022 Chronic systolic (congestive) heart failure: Secondary | ICD-10-CM | POA: Diagnosis not present

## 2023-01-28 DIAGNOSIS — R079 Chest pain, unspecified: Secondary | ICD-10-CM | POA: Diagnosis not present

## 2023-01-28 DIAGNOSIS — I10 Essential (primary) hypertension: Secondary | ICD-10-CM | POA: Diagnosis not present

## 2023-01-28 DIAGNOSIS — Z743 Need for continuous supervision: Secondary | ICD-10-CM | POA: Diagnosis not present

## 2023-01-28 DIAGNOSIS — Z20822 Contact with and (suspected) exposure to covid-19: Secondary | ICD-10-CM | POA: Diagnosis not present

## 2023-01-28 DIAGNOSIS — Z8673 Personal history of transient ischemic attack (TIA), and cerebral infarction without residual deficits: Secondary | ICD-10-CM | POA: Diagnosis not present

## 2023-01-28 DIAGNOSIS — N281 Cyst of kidney, acquired: Secondary | ICD-10-CM | POA: Diagnosis not present

## 2023-01-28 DIAGNOSIS — H02402 Unspecified ptosis of left eyelid: Secondary | ICD-10-CM | POA: Diagnosis not present

## 2023-01-28 DIAGNOSIS — N1330 Unspecified hydronephrosis: Secondary | ICD-10-CM | POA: Diagnosis not present

## 2023-01-28 DIAGNOSIS — I110 Hypertensive heart disease with heart failure: Secondary | ICD-10-CM | POA: Diagnosis not present

## 2023-01-28 DIAGNOSIS — B952 Enterococcus as the cause of diseases classified elsewhere: Secondary | ICD-10-CM | POA: Diagnosis not present

## 2023-01-28 DIAGNOSIS — Z0389 Encounter for observation for other suspected diseases and conditions ruled out: Secondary | ICD-10-CM | POA: Diagnosis not present

## 2023-01-28 DIAGNOSIS — I739 Peripheral vascular disease, unspecified: Secondary | ICD-10-CM | POA: Diagnosis not present

## 2023-01-28 DIAGNOSIS — R5381 Other malaise: Secondary | ICD-10-CM | POA: Diagnosis not present

## 2023-01-28 DIAGNOSIS — L89623 Pressure ulcer of left heel, stage 3: Secondary | ICD-10-CM | POA: Diagnosis not present

## 2023-01-28 DIAGNOSIS — N12 Tubulo-interstitial nephritis, not specified as acute or chronic: Secondary | ICD-10-CM | POA: Diagnosis not present

## 2023-01-28 DIAGNOSIS — G9340 Encephalopathy, unspecified: Secondary | ICD-10-CM | POA: Diagnosis not present

## 2023-01-28 DIAGNOSIS — N136 Pyonephrosis: Secondary | ICD-10-CM | POA: Diagnosis not present

## 2023-01-28 DIAGNOSIS — L97501 Non-pressure chronic ulcer of other part of unspecified foot limited to breakdown of skin: Secondary | ICD-10-CM | POA: Diagnosis not present

## 2023-01-28 DIAGNOSIS — L89893 Pressure ulcer of other site, stage 3: Secondary | ICD-10-CM | POA: Diagnosis not present

## 2023-01-28 DIAGNOSIS — A419 Sepsis, unspecified organism: Secondary | ICD-10-CM | POA: Diagnosis not present

## 2023-01-28 DIAGNOSIS — N179 Acute kidney failure, unspecified: Secondary | ICD-10-CM | POA: Diagnosis not present

## 2023-01-28 DIAGNOSIS — Z8679 Personal history of other diseases of the circulatory system: Secondary | ICD-10-CM | POA: Diagnosis not present

## 2023-01-28 DIAGNOSIS — L97421 Non-pressure chronic ulcer of left heel and midfoot limited to breakdown of skin: Secondary | ICD-10-CM | POA: Diagnosis not present

## 2023-01-28 DIAGNOSIS — I493 Ventricular premature depolarization: Secondary | ICD-10-CM | POA: Diagnosis not present

## 2023-01-28 DIAGNOSIS — G9689 Other specified disorders of central nervous system: Secondary | ICD-10-CM | POA: Diagnosis not present

## 2023-01-28 DIAGNOSIS — I69351 Hemiplegia and hemiparesis following cerebral infarction affecting right dominant side: Secondary | ICD-10-CM | POA: Diagnosis not present

## 2023-01-28 DIAGNOSIS — I679 Cerebrovascular disease, unspecified: Secondary | ICD-10-CM | POA: Diagnosis not present

## 2023-01-28 DIAGNOSIS — L89152 Pressure ulcer of sacral region, stage 2: Secondary | ICD-10-CM | POA: Diagnosis not present

## 2023-01-28 DIAGNOSIS — J9601 Acute respiratory failure with hypoxia: Secondary | ICD-10-CM | POA: Diagnosis not present

## 2023-01-28 DIAGNOSIS — N200 Calculus of kidney: Secondary | ICD-10-CM | POA: Diagnosis not present

## 2023-01-28 DIAGNOSIS — G9349 Other encephalopathy: Secondary | ICD-10-CM | POA: Diagnosis not present

## 2023-01-28 DIAGNOSIS — Z7409 Other reduced mobility: Secondary | ICD-10-CM | POA: Diagnosis not present

## 2023-01-28 DIAGNOSIS — R404 Transient alteration of awareness: Secondary | ICD-10-CM | POA: Diagnosis not present

## 2023-01-28 DIAGNOSIS — N3001 Acute cystitis with hematuria: Secondary | ICD-10-CM | POA: Diagnosis not present

## 2023-01-28 DIAGNOSIS — R4781 Slurred speech: Secondary | ICD-10-CM | POA: Diagnosis not present

## 2023-01-28 DIAGNOSIS — J439 Emphysema, unspecified: Secondary | ICD-10-CM | POA: Diagnosis not present

## 2023-01-28 DIAGNOSIS — L89153 Pressure ulcer of sacral region, stage 3: Secondary | ICD-10-CM | POA: Diagnosis not present

## 2023-01-28 DIAGNOSIS — R410 Disorientation, unspecified: Secondary | ICD-10-CM | POA: Diagnosis not present

## 2023-01-28 DIAGNOSIS — R42 Dizziness and giddiness: Secondary | ICD-10-CM | POA: Diagnosis not present

## 2023-01-28 DIAGNOSIS — I639 Cerebral infarction, unspecified: Secondary | ICD-10-CM | POA: Diagnosis not present

## 2023-01-28 DIAGNOSIS — N134 Hydroureter: Secondary | ICD-10-CM | POA: Diagnosis not present

## 2023-01-28 DIAGNOSIS — Z993 Dependence on wheelchair: Secondary | ICD-10-CM | POA: Diagnosis not present

## 2023-01-28 DIAGNOSIS — J9602 Acute respiratory failure with hypercapnia: Secondary | ICD-10-CM | POA: Diagnosis not present

## 2023-01-28 DIAGNOSIS — M069 Rheumatoid arthritis, unspecified: Secondary | ICD-10-CM | POA: Diagnosis not present

## 2023-01-28 DIAGNOSIS — E860 Dehydration: Secondary | ICD-10-CM | POA: Diagnosis not present

## 2023-01-28 DIAGNOSIS — R6520 Severe sepsis without septic shock: Secondary | ICD-10-CM | POA: Diagnosis not present

## 2023-01-28 DIAGNOSIS — I5042 Chronic combined systolic (congestive) and diastolic (congestive) heart failure: Secondary | ICD-10-CM | POA: Diagnosis not present

## 2023-01-28 DIAGNOSIS — E872 Acidosis, unspecified: Secondary | ICD-10-CM | POA: Diagnosis not present

## 2023-01-29 DIAGNOSIS — M069 Rheumatoid arthritis, unspecified: Secondary | ICD-10-CM | POA: Diagnosis not present

## 2023-01-29 DIAGNOSIS — Z8673 Personal history of transient ischemic attack (TIA), and cerebral infarction without residual deficits: Secondary | ICD-10-CM | POA: Diagnosis not present

## 2023-01-29 DIAGNOSIS — G9340 Encephalopathy, unspecified: Secondary | ICD-10-CM | POA: Diagnosis not present

## 2023-01-29 DIAGNOSIS — A419 Sepsis, unspecified organism: Secondary | ICD-10-CM | POA: Diagnosis not present

## 2023-01-29 DIAGNOSIS — R6520 Severe sepsis without septic shock: Secondary | ICD-10-CM | POA: Diagnosis not present

## 2023-01-29 DIAGNOSIS — N179 Acute kidney failure, unspecified: Secondary | ICD-10-CM | POA: Diagnosis not present

## 2023-01-29 DIAGNOSIS — N3001 Acute cystitis with hematuria: Secondary | ICD-10-CM | POA: Diagnosis not present

## 2023-01-29 DIAGNOSIS — I10 Essential (primary) hypertension: Secondary | ICD-10-CM | POA: Diagnosis not present

## 2023-02-03 DIAGNOSIS — I5042 Chronic combined systolic (congestive) and diastolic (congestive) heart failure: Secondary | ICD-10-CM | POA: Diagnosis not present

## 2023-02-03 DIAGNOSIS — I739 Peripheral vascular disease, unspecified: Secondary | ICD-10-CM | POA: Diagnosis not present

## 2023-02-09 DIAGNOSIS — I110 Hypertensive heart disease with heart failure: Secondary | ICD-10-CM | POA: Diagnosis not present

## 2023-02-09 DIAGNOSIS — Z7902 Long term (current) use of antithrombotics/antiplatelets: Secondary | ICD-10-CM | POA: Diagnosis not present

## 2023-02-09 DIAGNOSIS — M069 Rheumatoid arthritis, unspecified: Secondary | ICD-10-CM | POA: Diagnosis not present

## 2023-02-09 DIAGNOSIS — H548 Legal blindness, as defined in USA: Secondary | ICD-10-CM | POA: Diagnosis not present

## 2023-02-09 DIAGNOSIS — Z9181 History of falling: Secondary | ICD-10-CM | POA: Diagnosis not present

## 2023-02-09 DIAGNOSIS — G8929 Other chronic pain: Secondary | ICD-10-CM | POA: Diagnosis not present

## 2023-02-09 DIAGNOSIS — Z7952 Long term (current) use of systemic steroids: Secondary | ICD-10-CM | POA: Diagnosis not present

## 2023-02-09 DIAGNOSIS — I5042 Chronic combined systolic (congestive) and diastolic (congestive) heart failure: Secondary | ICD-10-CM | POA: Diagnosis not present

## 2023-02-09 DIAGNOSIS — E876 Hypokalemia: Secondary | ICD-10-CM | POA: Diagnosis not present

## 2023-02-09 DIAGNOSIS — S72141D Displaced intertrochanteric fracture of right femur, subsequent encounter for closed fracture with routine healing: Secondary | ICD-10-CM | POA: Diagnosis not present

## 2023-02-09 DIAGNOSIS — S32039D Unspecified fracture of third lumbar vertebra, subsequent encounter for fracture with routine healing: Secondary | ICD-10-CM | POA: Diagnosis not present

## 2023-02-09 DIAGNOSIS — R911 Solitary pulmonary nodule: Secondary | ICD-10-CM | POA: Diagnosis not present

## 2023-02-09 DIAGNOSIS — Z86010 Personal history of colonic polyps: Secondary | ICD-10-CM | POA: Diagnosis not present

## 2023-02-09 DIAGNOSIS — H409 Unspecified glaucoma: Secondary | ICD-10-CM | POA: Diagnosis not present

## 2023-02-09 DIAGNOSIS — M9701XD Periprosthetic fracture around internal prosthetic right hip joint, subsequent encounter: Secondary | ICD-10-CM | POA: Diagnosis not present

## 2023-02-09 DIAGNOSIS — M1990 Unspecified osteoarthritis, unspecified site: Secondary | ICD-10-CM | POA: Diagnosis not present

## 2023-02-09 DIAGNOSIS — S0083XD Contusion of other part of head, subsequent encounter: Secondary | ICD-10-CM | POA: Diagnosis not present

## 2023-02-09 DIAGNOSIS — G9341 Metabolic encephalopathy: Secondary | ICD-10-CM | POA: Diagnosis not present

## 2023-02-09 DIAGNOSIS — I739 Peripheral vascular disease, unspecified: Secondary | ICD-10-CM | POA: Diagnosis not present

## 2023-02-09 DIAGNOSIS — K219 Gastro-esophageal reflux disease without esophagitis: Secondary | ICD-10-CM | POA: Diagnosis not present

## 2023-02-11 DIAGNOSIS — Z86010 Personal history of colonic polyps: Secondary | ICD-10-CM | POA: Diagnosis not present

## 2023-02-11 DIAGNOSIS — N289 Disorder of kidney and ureter, unspecified: Secondary | ICD-10-CM | POA: Diagnosis not present

## 2023-02-11 DIAGNOSIS — E876 Hypokalemia: Secondary | ICD-10-CM | POA: Diagnosis not present

## 2023-02-11 DIAGNOSIS — I739 Peripheral vascular disease, unspecified: Secondary | ICD-10-CM | POA: Diagnosis not present

## 2023-02-11 DIAGNOSIS — H548 Legal blindness, as defined in USA: Secondary | ICD-10-CM | POA: Diagnosis not present

## 2023-02-11 DIAGNOSIS — I5042 Chronic combined systolic (congestive) and diastolic (congestive) heart failure: Secondary | ICD-10-CM | POA: Diagnosis not present

## 2023-02-11 DIAGNOSIS — S0083XD Contusion of other part of head, subsequent encounter: Secondary | ICD-10-CM | POA: Diagnosis not present

## 2023-02-11 DIAGNOSIS — Z466 Encounter for fitting and adjustment of urinary device: Secondary | ICD-10-CM | POA: Diagnosis not present

## 2023-02-11 DIAGNOSIS — Z7902 Long term (current) use of antithrombotics/antiplatelets: Secondary | ICD-10-CM | POA: Diagnosis not present

## 2023-02-11 DIAGNOSIS — I110 Hypertensive heart disease with heart failure: Secondary | ICD-10-CM | POA: Diagnosis not present

## 2023-02-11 DIAGNOSIS — G8929 Other chronic pain: Secondary | ICD-10-CM | POA: Diagnosis not present

## 2023-02-11 DIAGNOSIS — G9341 Metabolic encephalopathy: Secondary | ICD-10-CM | POA: Diagnosis not present

## 2023-02-11 DIAGNOSIS — M9701XD Periprosthetic fracture around internal prosthetic right hip joint, subsequent encounter: Secondary | ICD-10-CM | POA: Diagnosis not present

## 2023-02-11 DIAGNOSIS — N329 Bladder disorder, unspecified: Secondary | ICD-10-CM | POA: Diagnosis not present

## 2023-02-11 DIAGNOSIS — R911 Solitary pulmonary nodule: Secondary | ICD-10-CM | POA: Diagnosis not present

## 2023-02-11 DIAGNOSIS — S32039D Unspecified fracture of third lumbar vertebra, subsequent encounter for fracture with routine healing: Secondary | ICD-10-CM | POA: Diagnosis not present

## 2023-02-11 DIAGNOSIS — S72141D Displaced intertrochanteric fracture of right femur, subsequent encounter for closed fracture with routine healing: Secondary | ICD-10-CM | POA: Diagnosis not present

## 2023-02-11 DIAGNOSIS — Z9181 History of falling: Secondary | ICD-10-CM | POA: Diagnosis not present

## 2023-02-11 DIAGNOSIS — Z7952 Long term (current) use of systemic steroids: Secondary | ICD-10-CM | POA: Diagnosis not present

## 2023-02-11 DIAGNOSIS — N132 Hydronephrosis with renal and ureteral calculous obstruction: Secondary | ICD-10-CM | POA: Diagnosis not present

## 2023-02-11 DIAGNOSIS — M1990 Unspecified osteoarthritis, unspecified site: Secondary | ICD-10-CM | POA: Diagnosis not present

## 2023-02-11 DIAGNOSIS — Z743 Need for continuous supervision: Secondary | ICD-10-CM | POA: Diagnosis not present

## 2023-02-11 DIAGNOSIS — R310 Gross hematuria: Secondary | ICD-10-CM | POA: Diagnosis not present

## 2023-02-11 DIAGNOSIS — R319 Hematuria, unspecified: Secondary | ICD-10-CM | POA: Diagnosis not present

## 2023-02-11 DIAGNOSIS — H409 Unspecified glaucoma: Secondary | ICD-10-CM | POA: Diagnosis not present

## 2023-02-11 DIAGNOSIS — M069 Rheumatoid arthritis, unspecified: Secondary | ICD-10-CM | POA: Diagnosis not present

## 2023-02-11 DIAGNOSIS — K219 Gastro-esophageal reflux disease without esophagitis: Secondary | ICD-10-CM | POA: Diagnosis not present

## 2023-02-12 DIAGNOSIS — R319 Hematuria, unspecified: Secondary | ICD-10-CM | POA: Diagnosis not present

## 2023-02-12 DIAGNOSIS — R404 Transient alteration of awareness: Secondary | ICD-10-CM | POA: Diagnosis not present

## 2023-02-12 DIAGNOSIS — Z743 Need for continuous supervision: Secondary | ICD-10-CM | POA: Diagnosis not present

## 2023-02-12 DIAGNOSIS — Z7401 Bed confinement status: Secondary | ICD-10-CM | POA: Diagnosis not present

## 2023-02-13 DIAGNOSIS — E876 Hypokalemia: Secondary | ICD-10-CM | POA: Diagnosis not present

## 2023-02-13 DIAGNOSIS — M069 Rheumatoid arthritis, unspecified: Secondary | ICD-10-CM | POA: Diagnosis not present

## 2023-02-13 DIAGNOSIS — Z9181 History of falling: Secondary | ICD-10-CM | POA: Diagnosis not present

## 2023-02-13 DIAGNOSIS — I5042 Chronic combined systolic (congestive) and diastolic (congestive) heart failure: Secondary | ICD-10-CM | POA: Diagnosis not present

## 2023-02-13 DIAGNOSIS — M1990 Unspecified osteoarthritis, unspecified site: Secondary | ICD-10-CM | POA: Diagnosis not present

## 2023-02-13 DIAGNOSIS — S72141D Displaced intertrochanteric fracture of right femur, subsequent encounter for closed fracture with routine healing: Secondary | ICD-10-CM | POA: Diagnosis not present

## 2023-02-13 DIAGNOSIS — Z86010 Personal history of colonic polyps: Secondary | ICD-10-CM | POA: Diagnosis not present

## 2023-02-13 DIAGNOSIS — H409 Unspecified glaucoma: Secondary | ICD-10-CM | POA: Diagnosis not present

## 2023-02-13 DIAGNOSIS — G9341 Metabolic encephalopathy: Secondary | ICD-10-CM | POA: Diagnosis not present

## 2023-02-13 DIAGNOSIS — K219 Gastro-esophageal reflux disease without esophagitis: Secondary | ICD-10-CM | POA: Diagnosis not present

## 2023-02-13 DIAGNOSIS — I110 Hypertensive heart disease with heart failure: Secondary | ICD-10-CM | POA: Diagnosis not present

## 2023-02-13 DIAGNOSIS — S0083XD Contusion of other part of head, subsequent encounter: Secondary | ICD-10-CM | POA: Diagnosis not present

## 2023-02-13 DIAGNOSIS — G8929 Other chronic pain: Secondary | ICD-10-CM | POA: Diagnosis not present

## 2023-02-13 DIAGNOSIS — H548 Legal blindness, as defined in USA: Secondary | ICD-10-CM | POA: Diagnosis not present

## 2023-02-13 DIAGNOSIS — I739 Peripheral vascular disease, unspecified: Secondary | ICD-10-CM | POA: Diagnosis not present

## 2023-02-13 DIAGNOSIS — S32039D Unspecified fracture of third lumbar vertebra, subsequent encounter for fracture with routine healing: Secondary | ICD-10-CM | POA: Diagnosis not present

## 2023-02-13 DIAGNOSIS — R911 Solitary pulmonary nodule: Secondary | ICD-10-CM | POA: Diagnosis not present

## 2023-02-13 DIAGNOSIS — M9701XD Periprosthetic fracture around internal prosthetic right hip joint, subsequent encounter: Secondary | ICD-10-CM | POA: Diagnosis not present

## 2023-02-13 DIAGNOSIS — Z7952 Long term (current) use of systemic steroids: Secondary | ICD-10-CM | POA: Diagnosis not present

## 2023-02-13 DIAGNOSIS — Z7902 Long term (current) use of antithrombotics/antiplatelets: Secondary | ICD-10-CM | POA: Diagnosis not present

## 2023-02-16 DIAGNOSIS — M1990 Unspecified osteoarthritis, unspecified site: Secondary | ICD-10-CM | POA: Diagnosis not present

## 2023-02-16 DIAGNOSIS — I110 Hypertensive heart disease with heart failure: Secondary | ICD-10-CM | POA: Diagnosis not present

## 2023-02-16 DIAGNOSIS — Z7902 Long term (current) use of antithrombotics/antiplatelets: Secondary | ICD-10-CM | POA: Diagnosis not present

## 2023-02-16 DIAGNOSIS — Z86010 Personal history of colonic polyps: Secondary | ICD-10-CM | POA: Diagnosis not present

## 2023-02-16 DIAGNOSIS — K219 Gastro-esophageal reflux disease without esophagitis: Secondary | ICD-10-CM | POA: Diagnosis not present

## 2023-02-16 DIAGNOSIS — S72141D Displaced intertrochanteric fracture of right femur, subsequent encounter for closed fracture with routine healing: Secondary | ICD-10-CM | POA: Diagnosis not present

## 2023-02-16 DIAGNOSIS — I739 Peripheral vascular disease, unspecified: Secondary | ICD-10-CM | POA: Diagnosis not present

## 2023-02-16 DIAGNOSIS — S0083XD Contusion of other part of head, subsequent encounter: Secondary | ICD-10-CM | POA: Diagnosis not present

## 2023-02-16 DIAGNOSIS — E876 Hypokalemia: Secondary | ICD-10-CM | POA: Diagnosis not present

## 2023-02-16 DIAGNOSIS — H548 Legal blindness, as defined in USA: Secondary | ICD-10-CM | POA: Diagnosis not present

## 2023-02-16 DIAGNOSIS — Z7952 Long term (current) use of systemic steroids: Secondary | ICD-10-CM | POA: Diagnosis not present

## 2023-02-16 DIAGNOSIS — G8929 Other chronic pain: Secondary | ICD-10-CM | POA: Diagnosis not present

## 2023-02-16 DIAGNOSIS — H409 Unspecified glaucoma: Secondary | ICD-10-CM | POA: Diagnosis not present

## 2023-02-16 DIAGNOSIS — I5042 Chronic combined systolic (congestive) and diastolic (congestive) heart failure: Secondary | ICD-10-CM | POA: Diagnosis not present

## 2023-02-16 DIAGNOSIS — M9701XD Periprosthetic fracture around internal prosthetic right hip joint, subsequent encounter: Secondary | ICD-10-CM | POA: Diagnosis not present

## 2023-02-16 DIAGNOSIS — R911 Solitary pulmonary nodule: Secondary | ICD-10-CM | POA: Diagnosis not present

## 2023-02-16 DIAGNOSIS — S32039D Unspecified fracture of third lumbar vertebra, subsequent encounter for fracture with routine healing: Secondary | ICD-10-CM | POA: Diagnosis not present

## 2023-02-16 DIAGNOSIS — M069 Rheumatoid arthritis, unspecified: Secondary | ICD-10-CM | POA: Diagnosis not present

## 2023-02-16 DIAGNOSIS — G9341 Metabolic encephalopathy: Secondary | ICD-10-CM | POA: Diagnosis not present

## 2023-02-16 DIAGNOSIS — Z9181 History of falling: Secondary | ICD-10-CM | POA: Diagnosis not present

## 2023-02-17 DIAGNOSIS — I739 Peripheral vascular disease, unspecified: Secondary | ICD-10-CM | POA: Diagnosis not present

## 2023-02-17 DIAGNOSIS — K219 Gastro-esophageal reflux disease without esophagitis: Secondary | ICD-10-CM | POA: Diagnosis not present

## 2023-02-17 DIAGNOSIS — M9701XD Periprosthetic fracture around internal prosthetic right hip joint, subsequent encounter: Secondary | ICD-10-CM | POA: Diagnosis not present

## 2023-02-17 DIAGNOSIS — G9341 Metabolic encephalopathy: Secondary | ICD-10-CM | POA: Diagnosis not present

## 2023-02-17 DIAGNOSIS — S0083XD Contusion of other part of head, subsequent encounter: Secondary | ICD-10-CM | POA: Diagnosis not present

## 2023-02-17 DIAGNOSIS — Z7952 Long term (current) use of systemic steroids: Secondary | ICD-10-CM | POA: Diagnosis not present

## 2023-02-17 DIAGNOSIS — M1990 Unspecified osteoarthritis, unspecified site: Secondary | ICD-10-CM | POA: Diagnosis not present

## 2023-02-17 DIAGNOSIS — I5042 Chronic combined systolic (congestive) and diastolic (congestive) heart failure: Secondary | ICD-10-CM | POA: Diagnosis not present

## 2023-02-17 DIAGNOSIS — E876 Hypokalemia: Secondary | ICD-10-CM | POA: Diagnosis not present

## 2023-02-17 DIAGNOSIS — Z7902 Long term (current) use of antithrombotics/antiplatelets: Secondary | ICD-10-CM | POA: Diagnosis not present

## 2023-02-17 DIAGNOSIS — H548 Legal blindness, as defined in USA: Secondary | ICD-10-CM | POA: Diagnosis not present

## 2023-02-17 DIAGNOSIS — Z86010 Personal history of colonic polyps: Secondary | ICD-10-CM | POA: Diagnosis not present

## 2023-02-17 DIAGNOSIS — S72141D Displaced intertrochanteric fracture of right femur, subsequent encounter for closed fracture with routine healing: Secondary | ICD-10-CM | POA: Diagnosis not present

## 2023-02-17 DIAGNOSIS — S32039D Unspecified fracture of third lumbar vertebra, subsequent encounter for fracture with routine healing: Secondary | ICD-10-CM | POA: Diagnosis not present

## 2023-02-17 DIAGNOSIS — G8929 Other chronic pain: Secondary | ICD-10-CM | POA: Diagnosis not present

## 2023-02-17 DIAGNOSIS — Z9181 History of falling: Secondary | ICD-10-CM | POA: Diagnosis not present

## 2023-02-17 DIAGNOSIS — H409 Unspecified glaucoma: Secondary | ICD-10-CM | POA: Diagnosis not present

## 2023-02-17 DIAGNOSIS — M069 Rheumatoid arthritis, unspecified: Secondary | ICD-10-CM | POA: Diagnosis not present

## 2023-02-17 DIAGNOSIS — I110 Hypertensive heart disease with heart failure: Secondary | ICD-10-CM | POA: Diagnosis not present

## 2023-02-17 DIAGNOSIS — R911 Solitary pulmonary nodule: Secondary | ICD-10-CM | POA: Diagnosis not present

## 2023-02-18 DIAGNOSIS — I088 Other rheumatic multiple valve diseases: Secondary | ICD-10-CM | POA: Diagnosis not present

## 2023-02-18 DIAGNOSIS — R339 Retention of urine, unspecified: Secondary | ICD-10-CM | POA: Diagnosis not present

## 2023-02-18 DIAGNOSIS — M5417 Radiculopathy, lumbosacral region: Secondary | ICD-10-CM | POA: Diagnosis not present

## 2023-02-18 DIAGNOSIS — M48061 Spinal stenosis, lumbar region without neurogenic claudication: Secondary | ICD-10-CM | POA: Diagnosis not present

## 2023-02-18 DIAGNOSIS — R8290 Unspecified abnormal findings in urine: Secondary | ICD-10-CM | POA: Diagnosis not present

## 2023-02-18 DIAGNOSIS — S72141D Displaced intertrochanteric fracture of right femur, subsequent encounter for closed fracture with routine healing: Secondary | ICD-10-CM | POA: Diagnosis not present

## 2023-02-18 DIAGNOSIS — Z993 Dependence on wheelchair: Secondary | ICD-10-CM | POA: Diagnosis not present

## 2023-02-18 DIAGNOSIS — N329 Bladder disorder, unspecified: Secondary | ICD-10-CM | POA: Diagnosis not present

## 2023-02-18 DIAGNOSIS — I5042 Chronic combined systolic (congestive) and diastolic (congestive) heart failure: Secondary | ICD-10-CM | POA: Diagnosis not present

## 2023-02-18 DIAGNOSIS — I739 Peripheral vascular disease, unspecified: Secondary | ICD-10-CM | POA: Diagnosis not present

## 2023-02-18 DIAGNOSIS — M9701XD Periprosthetic fracture around internal prosthetic right hip joint, subsequent encounter: Secondary | ICD-10-CM | POA: Diagnosis not present

## 2023-02-18 DIAGNOSIS — G5621 Lesion of ulnar nerve, right upper limb: Secondary | ICD-10-CM | POA: Diagnosis not present

## 2023-02-18 DIAGNOSIS — S32039D Unspecified fracture of third lumbar vertebra, subsequent encounter for fracture with routine healing: Secondary | ICD-10-CM | POA: Diagnosis not present

## 2023-02-18 DIAGNOSIS — N183 Chronic kidney disease, stage 3 unspecified: Secondary | ICD-10-CM | POA: Diagnosis not present

## 2023-02-18 DIAGNOSIS — N289 Disorder of kidney and ureter, unspecified: Secondary | ICD-10-CM | POA: Diagnosis not present

## 2023-02-18 DIAGNOSIS — I130 Hypertensive heart and chronic kidney disease with heart failure and stage 1 through stage 4 chronic kidney disease, or unspecified chronic kidney disease: Secondary | ICD-10-CM | POA: Diagnosis not present

## 2023-02-26 DIAGNOSIS — I509 Heart failure, unspecified: Secondary | ICD-10-CM | POA: Diagnosis not present

## 2023-02-26 DIAGNOSIS — N289 Disorder of kidney and ureter, unspecified: Secondary | ICD-10-CM | POA: Diagnosis not present

## 2023-02-26 DIAGNOSIS — Z01818 Encounter for other preprocedural examination: Secondary | ICD-10-CM | POA: Diagnosis not present

## 2023-02-26 DIAGNOSIS — N183 Chronic kidney disease, stage 3 unspecified: Secondary | ICD-10-CM | POA: Diagnosis not present

## 2023-02-26 DIAGNOSIS — J984 Other disorders of lung: Secondary | ICD-10-CM | POA: Diagnosis not present

## 2023-02-26 DIAGNOSIS — Z01810 Encounter for preprocedural cardiovascular examination: Secondary | ICD-10-CM | POA: Diagnosis not present

## 2023-02-26 DIAGNOSIS — R339 Retention of urine, unspecified: Secondary | ICD-10-CM | POA: Diagnosis not present

## 2023-02-26 DIAGNOSIS — Z466 Encounter for fitting and adjustment of urinary device: Secondary | ICD-10-CM | POA: Diagnosis not present

## 2023-02-26 DIAGNOSIS — I700 Atherosclerosis of aorta: Secondary | ICD-10-CM | POA: Diagnosis not present

## 2023-02-26 DIAGNOSIS — N281 Cyst of kidney, acquired: Secondary | ICD-10-CM | POA: Diagnosis not present

## 2023-02-26 DIAGNOSIS — R8290 Unspecified abnormal findings in urine: Secondary | ICD-10-CM | POA: Diagnosis not present

## 2023-02-26 DIAGNOSIS — N1330 Unspecified hydronephrosis: Secondary | ICD-10-CM | POA: Diagnosis not present

## 2023-02-26 DIAGNOSIS — N329 Bladder disorder, unspecified: Secondary | ICD-10-CM | POA: Diagnosis not present

## 2023-02-27 DIAGNOSIS — M48061 Spinal stenosis, lumbar region without neurogenic claudication: Secondary | ICD-10-CM | POA: Diagnosis not present

## 2023-02-27 DIAGNOSIS — M069 Rheumatoid arthritis, unspecified: Secondary | ICD-10-CM | POA: Diagnosis not present

## 2023-02-27 DIAGNOSIS — Z466 Encounter for fitting and adjustment of urinary device: Secondary | ICD-10-CM | POA: Diagnosis not present

## 2023-02-27 DIAGNOSIS — G8929 Other chronic pain: Secondary | ICD-10-CM | POA: Diagnosis not present

## 2023-02-27 DIAGNOSIS — R911 Solitary pulmonary nodule: Secondary | ICD-10-CM | POA: Diagnosis not present

## 2023-02-27 DIAGNOSIS — S72141D Displaced intertrochanteric fracture of right femur, subsequent encounter for closed fracture with routine healing: Secondary | ICD-10-CM | POA: Diagnosis not present

## 2023-02-27 DIAGNOSIS — Z01810 Encounter for preprocedural cardiovascular examination: Secondary | ICD-10-CM | POA: Diagnosis not present

## 2023-02-27 DIAGNOSIS — M9701XD Periprosthetic fracture around internal prosthetic right hip joint, subsequent encounter: Secondary | ICD-10-CM | POA: Diagnosis not present

## 2023-02-27 DIAGNOSIS — Z7902 Long term (current) use of antithrombotics/antiplatelets: Secondary | ICD-10-CM | POA: Diagnosis not present

## 2023-02-27 DIAGNOSIS — K219 Gastro-esophageal reflux disease without esophagitis: Secondary | ICD-10-CM | POA: Diagnosis not present

## 2023-02-27 DIAGNOSIS — I5042 Chronic combined systolic (congestive) and diastolic (congestive) heart failure: Secondary | ICD-10-CM | POA: Diagnosis not present

## 2023-02-27 DIAGNOSIS — M5417 Radiculopathy, lumbosacral region: Secondary | ICD-10-CM | POA: Diagnosis not present

## 2023-02-27 DIAGNOSIS — I088 Other rheumatic multiple valve diseases: Secondary | ICD-10-CM | POA: Diagnosis not present

## 2023-02-27 DIAGNOSIS — H409 Unspecified glaucoma: Secondary | ICD-10-CM | POA: Diagnosis not present

## 2023-02-27 DIAGNOSIS — M1990 Unspecified osteoarthritis, unspecified site: Secondary | ICD-10-CM | POA: Diagnosis not present

## 2023-02-27 DIAGNOSIS — I130 Hypertensive heart and chronic kidney disease with heart failure and stage 1 through stage 4 chronic kidney disease, or unspecified chronic kidney disease: Secondary | ICD-10-CM | POA: Diagnosis not present

## 2023-02-27 DIAGNOSIS — I739 Peripheral vascular disease, unspecified: Secondary | ICD-10-CM | POA: Diagnosis not present

## 2023-02-27 DIAGNOSIS — G5621 Lesion of ulnar nerve, right upper limb: Secondary | ICD-10-CM | POA: Diagnosis not present

## 2023-02-27 DIAGNOSIS — H548 Legal blindness, as defined in USA: Secondary | ICD-10-CM | POA: Diagnosis not present

## 2023-02-27 DIAGNOSIS — S32039D Unspecified fracture of third lumbar vertebra, subsequent encounter for fracture with routine healing: Secondary | ICD-10-CM | POA: Diagnosis not present

## 2023-02-27 DIAGNOSIS — M5441 Lumbago with sciatica, right side: Secondary | ICD-10-CM | POA: Diagnosis not present

## 2023-02-27 DIAGNOSIS — N183 Chronic kidney disease, stage 3 unspecified: Secondary | ICD-10-CM | POA: Diagnosis not present

## 2023-03-02 DIAGNOSIS — I517 Cardiomegaly: Secondary | ICD-10-CM | POA: Diagnosis not present

## 2023-03-03 DIAGNOSIS — I739 Peripheral vascular disease, unspecified: Secondary | ICD-10-CM | POA: Diagnosis not present

## 2023-03-03 DIAGNOSIS — H409 Unspecified glaucoma: Secondary | ICD-10-CM | POA: Diagnosis not present

## 2023-03-03 DIAGNOSIS — Z7902 Long term (current) use of antithrombotics/antiplatelets: Secondary | ICD-10-CM | POA: Diagnosis not present

## 2023-03-03 DIAGNOSIS — R911 Solitary pulmonary nodule: Secondary | ICD-10-CM | POA: Diagnosis not present

## 2023-03-03 DIAGNOSIS — M1990 Unspecified osteoarthritis, unspecified site: Secondary | ICD-10-CM | POA: Diagnosis not present

## 2023-03-03 DIAGNOSIS — I130 Hypertensive heart and chronic kidney disease with heart failure and stage 1 through stage 4 chronic kidney disease, or unspecified chronic kidney disease: Secondary | ICD-10-CM | POA: Diagnosis not present

## 2023-03-03 DIAGNOSIS — I088 Other rheumatic multiple valve diseases: Secondary | ICD-10-CM | POA: Diagnosis not present

## 2023-03-03 DIAGNOSIS — M5441 Lumbago with sciatica, right side: Secondary | ICD-10-CM | POA: Diagnosis not present

## 2023-03-03 DIAGNOSIS — I5042 Chronic combined systolic (congestive) and diastolic (congestive) heart failure: Secondary | ICD-10-CM | POA: Diagnosis not present

## 2023-03-03 DIAGNOSIS — S32039D Unspecified fracture of third lumbar vertebra, subsequent encounter for fracture with routine healing: Secondary | ICD-10-CM | POA: Diagnosis not present

## 2023-03-03 DIAGNOSIS — M48061 Spinal stenosis, lumbar region without neurogenic claudication: Secondary | ICD-10-CM | POA: Diagnosis not present

## 2023-03-03 DIAGNOSIS — K219 Gastro-esophageal reflux disease without esophagitis: Secondary | ICD-10-CM | POA: Diagnosis not present

## 2023-03-03 DIAGNOSIS — M069 Rheumatoid arthritis, unspecified: Secondary | ICD-10-CM | POA: Diagnosis not present

## 2023-03-03 DIAGNOSIS — H548 Legal blindness, as defined in USA: Secondary | ICD-10-CM | POA: Diagnosis not present

## 2023-03-03 DIAGNOSIS — G5621 Lesion of ulnar nerve, right upper limb: Secondary | ICD-10-CM | POA: Diagnosis not present

## 2023-03-03 DIAGNOSIS — G8929 Other chronic pain: Secondary | ICD-10-CM | POA: Diagnosis not present

## 2023-03-03 DIAGNOSIS — M5417 Radiculopathy, lumbosacral region: Secondary | ICD-10-CM | POA: Diagnosis not present

## 2023-03-03 DIAGNOSIS — Z466 Encounter for fitting and adjustment of urinary device: Secondary | ICD-10-CM | POA: Diagnosis not present

## 2023-03-03 DIAGNOSIS — N183 Chronic kidney disease, stage 3 unspecified: Secondary | ICD-10-CM | POA: Diagnosis not present

## 2023-03-03 DIAGNOSIS — M9701XD Periprosthetic fracture around internal prosthetic right hip joint, subsequent encounter: Secondary | ICD-10-CM | POA: Diagnosis not present

## 2023-03-03 DIAGNOSIS — S72141D Displaced intertrochanteric fracture of right femur, subsequent encounter for closed fracture with routine healing: Secondary | ICD-10-CM | POA: Diagnosis not present

## 2023-03-05 DIAGNOSIS — M5441 Lumbago with sciatica, right side: Secondary | ICD-10-CM | POA: Diagnosis not present

## 2023-03-05 DIAGNOSIS — Z7902 Long term (current) use of antithrombotics/antiplatelets: Secondary | ICD-10-CM | POA: Diagnosis not present

## 2023-03-05 DIAGNOSIS — I739 Peripheral vascular disease, unspecified: Secondary | ICD-10-CM | POA: Diagnosis not present

## 2023-03-05 DIAGNOSIS — I088 Other rheumatic multiple valve diseases: Secondary | ICD-10-CM | POA: Diagnosis not present

## 2023-03-05 DIAGNOSIS — R911 Solitary pulmonary nodule: Secondary | ICD-10-CM | POA: Diagnosis not present

## 2023-03-05 DIAGNOSIS — I130 Hypertensive heart and chronic kidney disease with heart failure and stage 1 through stage 4 chronic kidney disease, or unspecified chronic kidney disease: Secondary | ICD-10-CM | POA: Diagnosis not present

## 2023-03-05 DIAGNOSIS — K219 Gastro-esophageal reflux disease without esophagitis: Secondary | ICD-10-CM | POA: Diagnosis not present

## 2023-03-05 DIAGNOSIS — I5042 Chronic combined systolic (congestive) and diastolic (congestive) heart failure: Secondary | ICD-10-CM | POA: Diagnosis not present

## 2023-03-05 DIAGNOSIS — S72141D Displaced intertrochanteric fracture of right femur, subsequent encounter for closed fracture with routine healing: Secondary | ICD-10-CM | POA: Diagnosis not present

## 2023-03-05 DIAGNOSIS — H409 Unspecified glaucoma: Secondary | ICD-10-CM | POA: Diagnosis not present

## 2023-03-05 DIAGNOSIS — M9701XD Periprosthetic fracture around internal prosthetic right hip joint, subsequent encounter: Secondary | ICD-10-CM | POA: Diagnosis not present

## 2023-03-05 DIAGNOSIS — M5417 Radiculopathy, lumbosacral region: Secondary | ICD-10-CM | POA: Diagnosis not present

## 2023-03-05 DIAGNOSIS — Z466 Encounter for fitting and adjustment of urinary device: Secondary | ICD-10-CM | POA: Diagnosis not present

## 2023-03-05 DIAGNOSIS — S32039D Unspecified fracture of third lumbar vertebra, subsequent encounter for fracture with routine healing: Secondary | ICD-10-CM | POA: Diagnosis not present

## 2023-03-05 DIAGNOSIS — M1990 Unspecified osteoarthritis, unspecified site: Secondary | ICD-10-CM | POA: Diagnosis not present

## 2023-03-05 DIAGNOSIS — N183 Chronic kidney disease, stage 3 unspecified: Secondary | ICD-10-CM | POA: Diagnosis not present

## 2023-03-05 DIAGNOSIS — M069 Rheumatoid arthritis, unspecified: Secondary | ICD-10-CM | POA: Diagnosis not present

## 2023-03-05 DIAGNOSIS — M48061 Spinal stenosis, lumbar region without neurogenic claudication: Secondary | ICD-10-CM | POA: Diagnosis not present

## 2023-03-05 DIAGNOSIS — G8929 Other chronic pain: Secondary | ICD-10-CM | POA: Diagnosis not present

## 2023-03-05 DIAGNOSIS — H548 Legal blindness, as defined in USA: Secondary | ICD-10-CM | POA: Diagnosis not present

## 2023-03-05 DIAGNOSIS — G5621 Lesion of ulnar nerve, right upper limb: Secondary | ICD-10-CM | POA: Diagnosis not present

## 2023-03-06 DIAGNOSIS — Z466 Encounter for fitting and adjustment of urinary device: Secondary | ICD-10-CM | POA: Diagnosis not present

## 2023-03-06 DIAGNOSIS — I130 Hypertensive heart and chronic kidney disease with heart failure and stage 1 through stage 4 chronic kidney disease, or unspecified chronic kidney disease: Secondary | ICD-10-CM | POA: Diagnosis not present

## 2023-03-06 DIAGNOSIS — I5042 Chronic combined systolic (congestive) and diastolic (congestive) heart failure: Secondary | ICD-10-CM | POA: Diagnosis not present

## 2023-03-06 DIAGNOSIS — S72141D Displaced intertrochanteric fracture of right femur, subsequent encounter for closed fracture with routine healing: Secondary | ICD-10-CM | POA: Diagnosis not present

## 2023-03-06 DIAGNOSIS — G5621 Lesion of ulnar nerve, right upper limb: Secondary | ICD-10-CM | POA: Diagnosis not present

## 2023-03-06 DIAGNOSIS — Z7902 Long term (current) use of antithrombotics/antiplatelets: Secondary | ICD-10-CM | POA: Diagnosis not present

## 2023-03-06 DIAGNOSIS — G8929 Other chronic pain: Secondary | ICD-10-CM | POA: Diagnosis not present

## 2023-03-06 DIAGNOSIS — H548 Legal blindness, as defined in USA: Secondary | ICD-10-CM | POA: Diagnosis not present

## 2023-03-06 DIAGNOSIS — M5417 Radiculopathy, lumbosacral region: Secondary | ICD-10-CM | POA: Diagnosis not present

## 2023-03-06 DIAGNOSIS — N183 Chronic kidney disease, stage 3 unspecified: Secondary | ICD-10-CM | POA: Diagnosis not present

## 2023-03-06 DIAGNOSIS — H409 Unspecified glaucoma: Secondary | ICD-10-CM | POA: Diagnosis not present

## 2023-03-06 DIAGNOSIS — M5441 Lumbago with sciatica, right side: Secondary | ICD-10-CM | POA: Diagnosis not present

## 2023-03-06 DIAGNOSIS — M069 Rheumatoid arthritis, unspecified: Secondary | ICD-10-CM | POA: Diagnosis not present

## 2023-03-06 DIAGNOSIS — M1990 Unspecified osteoarthritis, unspecified site: Secondary | ICD-10-CM | POA: Diagnosis not present

## 2023-03-06 DIAGNOSIS — M48061 Spinal stenosis, lumbar region without neurogenic claudication: Secondary | ICD-10-CM | POA: Diagnosis not present

## 2023-03-06 DIAGNOSIS — K219 Gastro-esophageal reflux disease without esophagitis: Secondary | ICD-10-CM | POA: Diagnosis not present

## 2023-03-06 DIAGNOSIS — M9701XD Periprosthetic fracture around internal prosthetic right hip joint, subsequent encounter: Secondary | ICD-10-CM | POA: Diagnosis not present

## 2023-03-06 DIAGNOSIS — I088 Other rheumatic multiple valve diseases: Secondary | ICD-10-CM | POA: Diagnosis not present

## 2023-03-06 DIAGNOSIS — R911 Solitary pulmonary nodule: Secondary | ICD-10-CM | POA: Diagnosis not present

## 2023-03-06 DIAGNOSIS — S32039D Unspecified fracture of third lumbar vertebra, subsequent encounter for fracture with routine healing: Secondary | ICD-10-CM | POA: Diagnosis not present

## 2023-03-06 DIAGNOSIS — I739 Peripheral vascular disease, unspecified: Secondary | ICD-10-CM | POA: Diagnosis not present

## 2023-03-08 DIAGNOSIS — Z7409 Other reduced mobility: Secondary | ICD-10-CM | POA: Diagnosis not present

## 2023-03-08 DIAGNOSIS — I679 Cerebrovascular disease, unspecified: Secondary | ICD-10-CM | POA: Diagnosis not present

## 2023-03-08 DIAGNOSIS — J9602 Acute respiratory failure with hypercapnia: Secondary | ICD-10-CM | POA: Diagnosis not present

## 2023-03-09 DIAGNOSIS — I739 Peripheral vascular disease, unspecified: Secondary | ICD-10-CM | POA: Diagnosis not present

## 2023-03-09 DIAGNOSIS — I088 Other rheumatic multiple valve diseases: Secondary | ICD-10-CM | POA: Diagnosis not present

## 2023-03-09 DIAGNOSIS — M1990 Unspecified osteoarthritis, unspecified site: Secondary | ICD-10-CM | POA: Diagnosis not present

## 2023-03-09 DIAGNOSIS — M5441 Lumbago with sciatica, right side: Secondary | ICD-10-CM | POA: Diagnosis not present

## 2023-03-09 DIAGNOSIS — I130 Hypertensive heart and chronic kidney disease with heart failure and stage 1 through stage 4 chronic kidney disease, or unspecified chronic kidney disease: Secondary | ICD-10-CM | POA: Diagnosis not present

## 2023-03-09 DIAGNOSIS — S72141D Displaced intertrochanteric fracture of right femur, subsequent encounter for closed fracture with routine healing: Secondary | ICD-10-CM | POA: Diagnosis not present

## 2023-03-09 DIAGNOSIS — G5621 Lesion of ulnar nerve, right upper limb: Secondary | ICD-10-CM | POA: Diagnosis not present

## 2023-03-09 DIAGNOSIS — H548 Legal blindness, as defined in USA: Secondary | ICD-10-CM | POA: Diagnosis not present

## 2023-03-09 DIAGNOSIS — K219 Gastro-esophageal reflux disease without esophagitis: Secondary | ICD-10-CM | POA: Diagnosis not present

## 2023-03-09 DIAGNOSIS — I5042 Chronic combined systolic (congestive) and diastolic (congestive) heart failure: Secondary | ICD-10-CM | POA: Diagnosis not present

## 2023-03-09 DIAGNOSIS — S32039D Unspecified fracture of third lumbar vertebra, subsequent encounter for fracture with routine healing: Secondary | ICD-10-CM | POA: Diagnosis not present

## 2023-03-09 DIAGNOSIS — R911 Solitary pulmonary nodule: Secondary | ICD-10-CM | POA: Diagnosis not present

## 2023-03-09 DIAGNOSIS — Z7902 Long term (current) use of antithrombotics/antiplatelets: Secondary | ICD-10-CM | POA: Diagnosis not present

## 2023-03-09 DIAGNOSIS — G8929 Other chronic pain: Secondary | ICD-10-CM | POA: Diagnosis not present

## 2023-03-09 DIAGNOSIS — M9701XD Periprosthetic fracture around internal prosthetic right hip joint, subsequent encounter: Secondary | ICD-10-CM | POA: Diagnosis not present

## 2023-03-09 DIAGNOSIS — M5417 Radiculopathy, lumbosacral region: Secondary | ICD-10-CM | POA: Diagnosis not present

## 2023-03-09 DIAGNOSIS — H409 Unspecified glaucoma: Secondary | ICD-10-CM | POA: Diagnosis not present

## 2023-03-09 DIAGNOSIS — M48061 Spinal stenosis, lumbar region without neurogenic claudication: Secondary | ICD-10-CM | POA: Diagnosis not present

## 2023-03-09 DIAGNOSIS — N183 Chronic kidney disease, stage 3 unspecified: Secondary | ICD-10-CM | POA: Diagnosis not present

## 2023-03-09 DIAGNOSIS — Z466 Encounter for fitting and adjustment of urinary device: Secondary | ICD-10-CM | POA: Diagnosis not present

## 2023-03-09 DIAGNOSIS — M069 Rheumatoid arthritis, unspecified: Secondary | ICD-10-CM | POA: Diagnosis not present

## 2023-03-10 DIAGNOSIS — S72141D Displaced intertrochanteric fracture of right femur, subsequent encounter for closed fracture with routine healing: Secondary | ICD-10-CM | POA: Diagnosis not present

## 2023-03-10 DIAGNOSIS — I088 Other rheumatic multiple valve diseases: Secondary | ICD-10-CM | POA: Diagnosis not present

## 2023-03-10 DIAGNOSIS — H548 Legal blindness, as defined in USA: Secondary | ICD-10-CM | POA: Diagnosis not present

## 2023-03-10 DIAGNOSIS — K219 Gastro-esophageal reflux disease without esophagitis: Secondary | ICD-10-CM | POA: Diagnosis not present

## 2023-03-10 DIAGNOSIS — M1990 Unspecified osteoarthritis, unspecified site: Secondary | ICD-10-CM | POA: Diagnosis not present

## 2023-03-10 DIAGNOSIS — M5441 Lumbago with sciatica, right side: Secondary | ICD-10-CM | POA: Diagnosis not present

## 2023-03-10 DIAGNOSIS — H409 Unspecified glaucoma: Secondary | ICD-10-CM | POA: Diagnosis not present

## 2023-03-10 DIAGNOSIS — I5042 Chronic combined systolic (congestive) and diastolic (congestive) heart failure: Secondary | ICD-10-CM | POA: Diagnosis not present

## 2023-03-10 DIAGNOSIS — I130 Hypertensive heart and chronic kidney disease with heart failure and stage 1 through stage 4 chronic kidney disease, or unspecified chronic kidney disease: Secondary | ICD-10-CM | POA: Diagnosis not present

## 2023-03-10 DIAGNOSIS — G5621 Lesion of ulnar nerve, right upper limb: Secondary | ICD-10-CM | POA: Diagnosis not present

## 2023-03-10 DIAGNOSIS — M48061 Spinal stenosis, lumbar region without neurogenic claudication: Secondary | ICD-10-CM | POA: Diagnosis not present

## 2023-03-10 DIAGNOSIS — Z466 Encounter for fitting and adjustment of urinary device: Secondary | ICD-10-CM | POA: Diagnosis not present

## 2023-03-10 DIAGNOSIS — M9701XD Periprosthetic fracture around internal prosthetic right hip joint, subsequent encounter: Secondary | ICD-10-CM | POA: Diagnosis not present

## 2023-03-10 DIAGNOSIS — I739 Peripheral vascular disease, unspecified: Secondary | ICD-10-CM | POA: Diagnosis not present

## 2023-03-10 DIAGNOSIS — M5417 Radiculopathy, lumbosacral region: Secondary | ICD-10-CM | POA: Diagnosis not present

## 2023-03-10 DIAGNOSIS — M069 Rheumatoid arthritis, unspecified: Secondary | ICD-10-CM | POA: Diagnosis not present

## 2023-03-10 DIAGNOSIS — Z7902 Long term (current) use of antithrombotics/antiplatelets: Secondary | ICD-10-CM | POA: Diagnosis not present

## 2023-03-10 DIAGNOSIS — S32039D Unspecified fracture of third lumbar vertebra, subsequent encounter for fracture with routine healing: Secondary | ICD-10-CM | POA: Diagnosis not present

## 2023-03-10 DIAGNOSIS — G8929 Other chronic pain: Secondary | ICD-10-CM | POA: Diagnosis not present

## 2023-03-10 DIAGNOSIS — R911 Solitary pulmonary nodule: Secondary | ICD-10-CM | POA: Diagnosis not present

## 2023-03-10 DIAGNOSIS — N183 Chronic kidney disease, stage 3 unspecified: Secondary | ICD-10-CM | POA: Diagnosis not present

## 2023-03-16 DIAGNOSIS — Z01810 Encounter for preprocedural cardiovascular examination: Secondary | ICD-10-CM | POA: Diagnosis not present

## 2023-03-16 DIAGNOSIS — I2510 Atherosclerotic heart disease of native coronary artery without angina pectoris: Secondary | ICD-10-CM | POA: Diagnosis not present

## 2023-03-16 DIAGNOSIS — I509 Heart failure, unspecified: Secondary | ICD-10-CM | POA: Diagnosis not present

## 2023-03-17 DIAGNOSIS — M48061 Spinal stenosis, lumbar region without neurogenic claudication: Secondary | ICD-10-CM | POA: Diagnosis not present

## 2023-03-17 DIAGNOSIS — I088 Other rheumatic multiple valve diseases: Secondary | ICD-10-CM | POA: Diagnosis not present

## 2023-03-17 DIAGNOSIS — R911 Solitary pulmonary nodule: Secondary | ICD-10-CM | POA: Diagnosis not present

## 2023-03-17 DIAGNOSIS — H409 Unspecified glaucoma: Secondary | ICD-10-CM | POA: Diagnosis not present

## 2023-03-17 DIAGNOSIS — K219 Gastro-esophageal reflux disease without esophagitis: Secondary | ICD-10-CM | POA: Diagnosis not present

## 2023-03-17 DIAGNOSIS — G8929 Other chronic pain: Secondary | ICD-10-CM | POA: Diagnosis not present

## 2023-03-17 DIAGNOSIS — S72141D Displaced intertrochanteric fracture of right femur, subsequent encounter for closed fracture with routine healing: Secondary | ICD-10-CM | POA: Diagnosis not present

## 2023-03-17 DIAGNOSIS — M5417 Radiculopathy, lumbosacral region: Secondary | ICD-10-CM | POA: Diagnosis not present

## 2023-03-17 DIAGNOSIS — S32039D Unspecified fracture of third lumbar vertebra, subsequent encounter for fracture with routine healing: Secondary | ICD-10-CM | POA: Diagnosis not present

## 2023-03-17 DIAGNOSIS — M5441 Lumbago with sciatica, right side: Secondary | ICD-10-CM | POA: Diagnosis not present

## 2023-03-17 DIAGNOSIS — N183 Chronic kidney disease, stage 3 unspecified: Secondary | ICD-10-CM | POA: Diagnosis not present

## 2023-03-17 DIAGNOSIS — G5621 Lesion of ulnar nerve, right upper limb: Secondary | ICD-10-CM | POA: Diagnosis not present

## 2023-03-17 DIAGNOSIS — Z7902 Long term (current) use of antithrombotics/antiplatelets: Secondary | ICD-10-CM | POA: Diagnosis not present

## 2023-03-17 DIAGNOSIS — I130 Hypertensive heart and chronic kidney disease with heart failure and stage 1 through stage 4 chronic kidney disease, or unspecified chronic kidney disease: Secondary | ICD-10-CM | POA: Diagnosis not present

## 2023-03-17 DIAGNOSIS — I5042 Chronic combined systolic (congestive) and diastolic (congestive) heart failure: Secondary | ICD-10-CM | POA: Diagnosis not present

## 2023-03-17 DIAGNOSIS — M069 Rheumatoid arthritis, unspecified: Secondary | ICD-10-CM | POA: Diagnosis not present

## 2023-03-17 DIAGNOSIS — H548 Legal blindness, as defined in USA: Secondary | ICD-10-CM | POA: Diagnosis not present

## 2023-03-17 DIAGNOSIS — I739 Peripheral vascular disease, unspecified: Secondary | ICD-10-CM | POA: Diagnosis not present

## 2023-03-17 DIAGNOSIS — M9701XD Periprosthetic fracture around internal prosthetic right hip joint, subsequent encounter: Secondary | ICD-10-CM | POA: Diagnosis not present

## 2023-03-17 DIAGNOSIS — M1990 Unspecified osteoarthritis, unspecified site: Secondary | ICD-10-CM | POA: Diagnosis not present

## 2023-03-17 DIAGNOSIS — Z466 Encounter for fitting and adjustment of urinary device: Secondary | ICD-10-CM | POA: Diagnosis not present

## 2023-03-18 DIAGNOSIS — G894 Chronic pain syndrome: Secondary | ICD-10-CM | POA: Diagnosis not present

## 2023-03-18 DIAGNOSIS — M47816 Spondylosis without myelopathy or radiculopathy, lumbar region: Secondary | ICD-10-CM | POA: Diagnosis not present

## 2023-03-18 DIAGNOSIS — M0589 Other rheumatoid arthritis with rheumatoid factor of multiple sites: Secondary | ICD-10-CM | POA: Diagnosis not present

## 2023-03-18 DIAGNOSIS — G5621 Lesion of ulnar nerve, right upper limb: Secondary | ICD-10-CM | POA: Diagnosis not present

## 2023-03-19 DIAGNOSIS — N1330 Unspecified hydronephrosis: Secondary | ICD-10-CM | POA: Diagnosis not present

## 2023-03-19 DIAGNOSIS — M069 Rheumatoid arthritis, unspecified: Secondary | ICD-10-CM | POA: Diagnosis not present

## 2023-03-19 DIAGNOSIS — N131 Hydronephrosis with ureteral stricture, not elsewhere classified: Secondary | ICD-10-CM | POA: Diagnosis not present

## 2023-03-19 DIAGNOSIS — C679 Malignant neoplasm of bladder, unspecified: Secondary | ICD-10-CM | POA: Diagnosis not present

## 2023-03-19 DIAGNOSIS — Z993 Dependence on wheelchair: Secondary | ICD-10-CM | POA: Diagnosis not present

## 2023-03-19 DIAGNOSIS — D494 Neoplasm of unspecified behavior of bladder: Secondary | ICD-10-CM | POA: Diagnosis not present

## 2023-03-19 DIAGNOSIS — Z8673 Personal history of transient ischemic attack (TIA), and cerebral infarction without residual deficits: Secondary | ICD-10-CM | POA: Diagnosis not present

## 2023-03-19 DIAGNOSIS — Z79899 Other long term (current) drug therapy: Secondary | ICD-10-CM | POA: Diagnosis not present

## 2023-03-19 DIAGNOSIS — C674 Malignant neoplasm of posterior wall of bladder: Secondary | ICD-10-CM | POA: Diagnosis not present

## 2023-03-19 DIAGNOSIS — I10 Essential (primary) hypertension: Secondary | ICD-10-CM | POA: Diagnosis not present

## 2023-03-20 DIAGNOSIS — M9701XD Periprosthetic fracture around internal prosthetic right hip joint, subsequent encounter: Secondary | ICD-10-CM | POA: Diagnosis not present

## 2023-03-20 DIAGNOSIS — M069 Rheumatoid arthritis, unspecified: Secondary | ICD-10-CM | POA: Diagnosis not present

## 2023-03-20 DIAGNOSIS — M5441 Lumbago with sciatica, right side: Secondary | ICD-10-CM | POA: Diagnosis not present

## 2023-03-20 DIAGNOSIS — I739 Peripheral vascular disease, unspecified: Secondary | ICD-10-CM | POA: Diagnosis not present

## 2023-03-20 DIAGNOSIS — N183 Chronic kidney disease, stage 3 unspecified: Secondary | ICD-10-CM | POA: Diagnosis not present

## 2023-03-20 DIAGNOSIS — S72141D Displaced intertrochanteric fracture of right femur, subsequent encounter for closed fracture with routine healing: Secondary | ICD-10-CM | POA: Diagnosis not present

## 2023-03-20 DIAGNOSIS — H548 Legal blindness, as defined in USA: Secondary | ICD-10-CM | POA: Diagnosis not present

## 2023-03-20 DIAGNOSIS — I5042 Chronic combined systolic (congestive) and diastolic (congestive) heart failure: Secondary | ICD-10-CM | POA: Diagnosis not present

## 2023-03-20 DIAGNOSIS — G5621 Lesion of ulnar nerve, right upper limb: Secondary | ICD-10-CM | POA: Diagnosis not present

## 2023-03-20 DIAGNOSIS — H409 Unspecified glaucoma: Secondary | ICD-10-CM | POA: Diagnosis not present

## 2023-03-20 DIAGNOSIS — I130 Hypertensive heart and chronic kidney disease with heart failure and stage 1 through stage 4 chronic kidney disease, or unspecified chronic kidney disease: Secondary | ICD-10-CM | POA: Diagnosis not present

## 2023-03-20 DIAGNOSIS — M48061 Spinal stenosis, lumbar region without neurogenic claudication: Secondary | ICD-10-CM | POA: Diagnosis not present

## 2023-03-20 DIAGNOSIS — M1990 Unspecified osteoarthritis, unspecified site: Secondary | ICD-10-CM | POA: Diagnosis not present

## 2023-03-20 DIAGNOSIS — R911 Solitary pulmonary nodule: Secondary | ICD-10-CM | POA: Diagnosis not present

## 2023-03-20 DIAGNOSIS — K219 Gastro-esophageal reflux disease without esophagitis: Secondary | ICD-10-CM | POA: Diagnosis not present

## 2023-03-20 DIAGNOSIS — I088 Other rheumatic multiple valve diseases: Secondary | ICD-10-CM | POA: Diagnosis not present

## 2023-03-20 DIAGNOSIS — M5417 Radiculopathy, lumbosacral region: Secondary | ICD-10-CM | POA: Diagnosis not present

## 2023-03-20 DIAGNOSIS — Z466 Encounter for fitting and adjustment of urinary device: Secondary | ICD-10-CM | POA: Diagnosis not present

## 2023-03-20 DIAGNOSIS — Z7902 Long term (current) use of antithrombotics/antiplatelets: Secondary | ICD-10-CM | POA: Diagnosis not present

## 2023-03-20 DIAGNOSIS — S32039D Unspecified fracture of third lumbar vertebra, subsequent encounter for fracture with routine healing: Secondary | ICD-10-CM | POA: Diagnosis not present

## 2023-03-20 DIAGNOSIS — G8929 Other chronic pain: Secondary | ICD-10-CM | POA: Diagnosis not present

## 2023-03-24 DIAGNOSIS — S72141D Displaced intertrochanteric fracture of right femur, subsequent encounter for closed fracture with routine healing: Secondary | ICD-10-CM | POA: Diagnosis not present

## 2023-03-24 DIAGNOSIS — M5417 Radiculopathy, lumbosacral region: Secondary | ICD-10-CM | POA: Diagnosis not present

## 2023-03-24 DIAGNOSIS — Z466 Encounter for fitting and adjustment of urinary device: Secondary | ICD-10-CM | POA: Diagnosis not present

## 2023-03-24 DIAGNOSIS — M1990 Unspecified osteoarthritis, unspecified site: Secondary | ICD-10-CM | POA: Diagnosis not present

## 2023-03-24 DIAGNOSIS — K219 Gastro-esophageal reflux disease without esophagitis: Secondary | ICD-10-CM | POA: Diagnosis not present

## 2023-03-24 DIAGNOSIS — M069 Rheumatoid arthritis, unspecified: Secondary | ICD-10-CM | POA: Diagnosis not present

## 2023-03-24 DIAGNOSIS — I088 Other rheumatic multiple valve diseases: Secondary | ICD-10-CM | POA: Diagnosis not present

## 2023-03-24 DIAGNOSIS — H548 Legal blindness, as defined in USA: Secondary | ICD-10-CM | POA: Diagnosis not present

## 2023-03-24 DIAGNOSIS — M5441 Lumbago with sciatica, right side: Secondary | ICD-10-CM | POA: Diagnosis not present

## 2023-03-24 DIAGNOSIS — G5621 Lesion of ulnar nerve, right upper limb: Secondary | ICD-10-CM | POA: Diagnosis not present

## 2023-03-24 DIAGNOSIS — Z7902 Long term (current) use of antithrombotics/antiplatelets: Secondary | ICD-10-CM | POA: Diagnosis not present

## 2023-03-24 DIAGNOSIS — N183 Chronic kidney disease, stage 3 unspecified: Secondary | ICD-10-CM | POA: Diagnosis not present

## 2023-03-24 DIAGNOSIS — R911 Solitary pulmonary nodule: Secondary | ICD-10-CM | POA: Diagnosis not present

## 2023-03-24 DIAGNOSIS — S32039D Unspecified fracture of third lumbar vertebra, subsequent encounter for fracture with routine healing: Secondary | ICD-10-CM | POA: Diagnosis not present

## 2023-03-24 DIAGNOSIS — I5042 Chronic combined systolic (congestive) and diastolic (congestive) heart failure: Secondary | ICD-10-CM | POA: Diagnosis not present

## 2023-03-24 DIAGNOSIS — M48061 Spinal stenosis, lumbar region without neurogenic claudication: Secondary | ICD-10-CM | POA: Diagnosis not present

## 2023-03-24 DIAGNOSIS — H409 Unspecified glaucoma: Secondary | ICD-10-CM | POA: Diagnosis not present

## 2023-03-24 DIAGNOSIS — M9701XD Periprosthetic fracture around internal prosthetic right hip joint, subsequent encounter: Secondary | ICD-10-CM | POA: Diagnosis not present

## 2023-03-24 DIAGNOSIS — I739 Peripheral vascular disease, unspecified: Secondary | ICD-10-CM | POA: Diagnosis not present

## 2023-03-24 DIAGNOSIS — G8929 Other chronic pain: Secondary | ICD-10-CM | POA: Diagnosis not present

## 2023-03-24 DIAGNOSIS — I130 Hypertensive heart and chronic kidney disease with heart failure and stage 1 through stage 4 chronic kidney disease, or unspecified chronic kidney disease: Secondary | ICD-10-CM | POA: Diagnosis not present

## 2023-03-25 DIAGNOSIS — Z7902 Long term (current) use of antithrombotics/antiplatelets: Secondary | ICD-10-CM | POA: Diagnosis not present

## 2023-03-25 DIAGNOSIS — Z466 Encounter for fitting and adjustment of urinary device: Secondary | ICD-10-CM | POA: Diagnosis not present

## 2023-03-25 DIAGNOSIS — G8929 Other chronic pain: Secondary | ICD-10-CM | POA: Diagnosis not present

## 2023-03-25 DIAGNOSIS — M48061 Spinal stenosis, lumbar region without neurogenic claudication: Secondary | ICD-10-CM | POA: Diagnosis not present

## 2023-03-25 DIAGNOSIS — R911 Solitary pulmonary nodule: Secondary | ICD-10-CM | POA: Diagnosis not present

## 2023-03-25 DIAGNOSIS — I130 Hypertensive heart and chronic kidney disease with heart failure and stage 1 through stage 4 chronic kidney disease, or unspecified chronic kidney disease: Secondary | ICD-10-CM | POA: Diagnosis not present

## 2023-03-25 DIAGNOSIS — H409 Unspecified glaucoma: Secondary | ICD-10-CM | POA: Diagnosis not present

## 2023-03-25 DIAGNOSIS — N183 Chronic kidney disease, stage 3 unspecified: Secondary | ICD-10-CM | POA: Diagnosis not present

## 2023-03-25 DIAGNOSIS — S72141D Displaced intertrochanteric fracture of right femur, subsequent encounter for closed fracture with routine healing: Secondary | ICD-10-CM | POA: Diagnosis not present

## 2023-03-25 DIAGNOSIS — S32039D Unspecified fracture of third lumbar vertebra, subsequent encounter for fracture with routine healing: Secondary | ICD-10-CM | POA: Diagnosis not present

## 2023-03-25 DIAGNOSIS — I739 Peripheral vascular disease, unspecified: Secondary | ICD-10-CM | POA: Diagnosis not present

## 2023-03-25 DIAGNOSIS — M5441 Lumbago with sciatica, right side: Secondary | ICD-10-CM | POA: Diagnosis not present

## 2023-03-25 DIAGNOSIS — K219 Gastro-esophageal reflux disease without esophagitis: Secondary | ICD-10-CM | POA: Diagnosis not present

## 2023-03-25 DIAGNOSIS — I088 Other rheumatic multiple valve diseases: Secondary | ICD-10-CM | POA: Diagnosis not present

## 2023-03-25 DIAGNOSIS — M069 Rheumatoid arthritis, unspecified: Secondary | ICD-10-CM | POA: Diagnosis not present

## 2023-03-25 DIAGNOSIS — G5621 Lesion of ulnar nerve, right upper limb: Secondary | ICD-10-CM | POA: Diagnosis not present

## 2023-03-25 DIAGNOSIS — M9701XD Periprosthetic fracture around internal prosthetic right hip joint, subsequent encounter: Secondary | ICD-10-CM | POA: Diagnosis not present

## 2023-03-25 DIAGNOSIS — H548 Legal blindness, as defined in USA: Secondary | ICD-10-CM | POA: Diagnosis not present

## 2023-03-25 DIAGNOSIS — M5417 Radiculopathy, lumbosacral region: Secondary | ICD-10-CM | POA: Diagnosis not present

## 2023-03-25 DIAGNOSIS — I5042 Chronic combined systolic (congestive) and diastolic (congestive) heart failure: Secondary | ICD-10-CM | POA: Diagnosis not present

## 2023-03-25 DIAGNOSIS — M1990 Unspecified osteoarthritis, unspecified site: Secondary | ICD-10-CM | POA: Diagnosis not present

## 2023-03-25 NOTE — Progress Notes (Signed)
Pt presents with altered mental status, needs to rule out for potential infectious cause, which necessitate ordering of viral panel.

## 2023-03-26 DIAGNOSIS — G5621 Lesion of ulnar nerve, right upper limb: Secondary | ICD-10-CM | POA: Diagnosis not present

## 2023-03-26 DIAGNOSIS — R911 Solitary pulmonary nodule: Secondary | ICD-10-CM | POA: Diagnosis not present

## 2023-03-26 DIAGNOSIS — H548 Legal blindness, as defined in USA: Secondary | ICD-10-CM | POA: Diagnosis not present

## 2023-03-26 DIAGNOSIS — I088 Other rheumatic multiple valve diseases: Secondary | ICD-10-CM | POA: Diagnosis not present

## 2023-03-26 DIAGNOSIS — N183 Chronic kidney disease, stage 3 unspecified: Secondary | ICD-10-CM | POA: Diagnosis not present

## 2023-03-26 DIAGNOSIS — Z466 Encounter for fitting and adjustment of urinary device: Secondary | ICD-10-CM | POA: Diagnosis not present

## 2023-03-26 DIAGNOSIS — N138 Other obstructive and reflux uropathy: Secondary | ICD-10-CM | POA: Diagnosis not present

## 2023-03-26 DIAGNOSIS — M1990 Unspecified osteoarthritis, unspecified site: Secondary | ICD-10-CM | POA: Diagnosis not present

## 2023-03-26 DIAGNOSIS — S72141D Displaced intertrochanteric fracture of right femur, subsequent encounter for closed fracture with routine healing: Secondary | ICD-10-CM | POA: Diagnosis not present

## 2023-03-26 DIAGNOSIS — N131 Hydronephrosis with ureteral stricture, not elsewhere classified: Secondary | ICD-10-CM | POA: Diagnosis not present

## 2023-03-26 DIAGNOSIS — K219 Gastro-esophageal reflux disease without esophagitis: Secondary | ICD-10-CM | POA: Diagnosis not present

## 2023-03-26 DIAGNOSIS — M5417 Radiculopathy, lumbosacral region: Secondary | ICD-10-CM | POA: Diagnosis not present

## 2023-03-26 DIAGNOSIS — I130 Hypertensive heart and chronic kidney disease with heart failure and stage 1 through stage 4 chronic kidney disease, or unspecified chronic kidney disease: Secondary | ICD-10-CM | POA: Diagnosis not present

## 2023-03-26 DIAGNOSIS — R339 Retention of urine, unspecified: Secondary | ICD-10-CM | POA: Diagnosis not present

## 2023-03-26 DIAGNOSIS — M5441 Lumbago with sciatica, right side: Secondary | ICD-10-CM | POA: Diagnosis not present

## 2023-03-26 DIAGNOSIS — I5042 Chronic combined systolic (congestive) and diastolic (congestive) heart failure: Secondary | ICD-10-CM | POA: Diagnosis not present

## 2023-03-26 DIAGNOSIS — M069 Rheumatoid arthritis, unspecified: Secondary | ICD-10-CM | POA: Diagnosis not present

## 2023-03-26 DIAGNOSIS — I739 Peripheral vascular disease, unspecified: Secondary | ICD-10-CM | POA: Diagnosis not present

## 2023-03-26 DIAGNOSIS — H409 Unspecified glaucoma: Secondary | ICD-10-CM | POA: Diagnosis not present

## 2023-03-26 DIAGNOSIS — C678 Malignant neoplasm of overlapping sites of bladder: Secondary | ICD-10-CM | POA: Diagnosis not present

## 2023-03-26 DIAGNOSIS — M48061 Spinal stenosis, lumbar region without neurogenic claudication: Secondary | ICD-10-CM | POA: Diagnosis not present

## 2023-03-26 DIAGNOSIS — G8929 Other chronic pain: Secondary | ICD-10-CM | POA: Diagnosis not present

## 2023-03-26 DIAGNOSIS — S32039D Unspecified fracture of third lumbar vertebra, subsequent encounter for fracture with routine healing: Secondary | ICD-10-CM | POA: Diagnosis not present

## 2023-03-26 DIAGNOSIS — N281 Cyst of kidney, acquired: Secondary | ICD-10-CM | POA: Diagnosis not present

## 2023-03-26 DIAGNOSIS — Z7902 Long term (current) use of antithrombotics/antiplatelets: Secondary | ICD-10-CM | POA: Diagnosis not present

## 2023-03-26 DIAGNOSIS — M9701XD Periprosthetic fracture around internal prosthetic right hip joint, subsequent encounter: Secondary | ICD-10-CM | POA: Diagnosis not present

## 2023-04-01 DIAGNOSIS — M5441 Lumbago with sciatica, right side: Secondary | ICD-10-CM | POA: Diagnosis not present

## 2023-04-01 DIAGNOSIS — S32039D Unspecified fracture of third lumbar vertebra, subsequent encounter for fracture with routine healing: Secondary | ICD-10-CM | POA: Diagnosis not present

## 2023-04-01 DIAGNOSIS — M069 Rheumatoid arthritis, unspecified: Secondary | ICD-10-CM | POA: Diagnosis not present

## 2023-04-01 DIAGNOSIS — I130 Hypertensive heart and chronic kidney disease with heart failure and stage 1 through stage 4 chronic kidney disease, or unspecified chronic kidney disease: Secondary | ICD-10-CM | POA: Diagnosis not present

## 2023-04-01 DIAGNOSIS — H548 Legal blindness, as defined in USA: Secondary | ICD-10-CM | POA: Diagnosis not present

## 2023-04-01 DIAGNOSIS — M48061 Spinal stenosis, lumbar region without neurogenic claudication: Secondary | ICD-10-CM | POA: Diagnosis not present

## 2023-04-01 DIAGNOSIS — I5042 Chronic combined systolic (congestive) and diastolic (congestive) heart failure: Secondary | ICD-10-CM | POA: Diagnosis not present

## 2023-04-01 DIAGNOSIS — M5417 Radiculopathy, lumbosacral region: Secondary | ICD-10-CM | POA: Diagnosis not present

## 2023-04-01 DIAGNOSIS — N183 Chronic kidney disease, stage 3 unspecified: Secondary | ICD-10-CM | POA: Diagnosis not present

## 2023-04-01 DIAGNOSIS — S72141D Displaced intertrochanteric fracture of right femur, subsequent encounter for closed fracture with routine healing: Secondary | ICD-10-CM | POA: Diagnosis not present

## 2023-04-01 DIAGNOSIS — H409 Unspecified glaucoma: Secondary | ICD-10-CM | POA: Diagnosis not present

## 2023-04-01 DIAGNOSIS — M1990 Unspecified osteoarthritis, unspecified site: Secondary | ICD-10-CM | POA: Diagnosis not present

## 2023-04-01 DIAGNOSIS — M9701XD Periprosthetic fracture around internal prosthetic right hip joint, subsequent encounter: Secondary | ICD-10-CM | POA: Diagnosis not present

## 2023-04-01 DIAGNOSIS — I088 Other rheumatic multiple valve diseases: Secondary | ICD-10-CM | POA: Diagnosis not present

## 2023-04-01 DIAGNOSIS — Z466 Encounter for fitting and adjustment of urinary device: Secondary | ICD-10-CM | POA: Diagnosis not present

## 2023-04-01 DIAGNOSIS — G5621 Lesion of ulnar nerve, right upper limb: Secondary | ICD-10-CM | POA: Diagnosis not present

## 2023-04-01 DIAGNOSIS — Z7902 Long term (current) use of antithrombotics/antiplatelets: Secondary | ICD-10-CM | POA: Diagnosis not present

## 2023-04-01 DIAGNOSIS — G8929 Other chronic pain: Secondary | ICD-10-CM | POA: Diagnosis not present

## 2023-04-01 DIAGNOSIS — I739 Peripheral vascular disease, unspecified: Secondary | ICD-10-CM | POA: Diagnosis not present

## 2023-04-01 DIAGNOSIS — K219 Gastro-esophageal reflux disease without esophagitis: Secondary | ICD-10-CM | POA: Diagnosis not present

## 2023-04-01 DIAGNOSIS — R911 Solitary pulmonary nodule: Secondary | ICD-10-CM | POA: Diagnosis not present

## 2023-04-02 DIAGNOSIS — H409 Unspecified glaucoma: Secondary | ICD-10-CM | POA: Diagnosis not present

## 2023-04-02 DIAGNOSIS — G5621 Lesion of ulnar nerve, right upper limb: Secondary | ICD-10-CM | POA: Diagnosis not present

## 2023-04-02 DIAGNOSIS — Z7902 Long term (current) use of antithrombotics/antiplatelets: Secondary | ICD-10-CM | POA: Diagnosis not present

## 2023-04-02 DIAGNOSIS — M5441 Lumbago with sciatica, right side: Secondary | ICD-10-CM | POA: Diagnosis not present

## 2023-04-02 DIAGNOSIS — I130 Hypertensive heart and chronic kidney disease with heart failure and stage 1 through stage 4 chronic kidney disease, or unspecified chronic kidney disease: Secondary | ICD-10-CM | POA: Diagnosis not present

## 2023-04-02 DIAGNOSIS — M1990 Unspecified osteoarthritis, unspecified site: Secondary | ICD-10-CM | POA: Diagnosis not present

## 2023-04-02 DIAGNOSIS — H548 Legal blindness, as defined in USA: Secondary | ICD-10-CM | POA: Diagnosis not present

## 2023-04-02 DIAGNOSIS — M5417 Radiculopathy, lumbosacral region: Secondary | ICD-10-CM | POA: Diagnosis not present

## 2023-04-02 DIAGNOSIS — I739 Peripheral vascular disease, unspecified: Secondary | ICD-10-CM | POA: Diagnosis not present

## 2023-04-02 DIAGNOSIS — G8929 Other chronic pain: Secondary | ICD-10-CM | POA: Diagnosis not present

## 2023-04-02 DIAGNOSIS — R911 Solitary pulmonary nodule: Secondary | ICD-10-CM | POA: Diagnosis not present

## 2023-04-02 DIAGNOSIS — M48061 Spinal stenosis, lumbar region without neurogenic claudication: Secondary | ICD-10-CM | POA: Diagnosis not present

## 2023-04-02 DIAGNOSIS — I088 Other rheumatic multiple valve diseases: Secondary | ICD-10-CM | POA: Diagnosis not present

## 2023-04-02 DIAGNOSIS — Z466 Encounter for fitting and adjustment of urinary device: Secondary | ICD-10-CM | POA: Diagnosis not present

## 2023-04-02 DIAGNOSIS — N183 Chronic kidney disease, stage 3 unspecified: Secondary | ICD-10-CM | POA: Diagnosis not present

## 2023-04-02 DIAGNOSIS — S32039D Unspecified fracture of third lumbar vertebra, subsequent encounter for fracture with routine healing: Secondary | ICD-10-CM | POA: Diagnosis not present

## 2023-04-02 DIAGNOSIS — M9701XD Periprosthetic fracture around internal prosthetic right hip joint, subsequent encounter: Secondary | ICD-10-CM | POA: Diagnosis not present

## 2023-04-02 DIAGNOSIS — K219 Gastro-esophageal reflux disease without esophagitis: Secondary | ICD-10-CM | POA: Diagnosis not present

## 2023-04-02 DIAGNOSIS — I5042 Chronic combined systolic (congestive) and diastolic (congestive) heart failure: Secondary | ICD-10-CM | POA: Diagnosis not present

## 2023-04-02 DIAGNOSIS — M069 Rheumatoid arthritis, unspecified: Secondary | ICD-10-CM | POA: Diagnosis not present

## 2023-04-02 DIAGNOSIS — S72141D Displaced intertrochanteric fracture of right femur, subsequent encounter for closed fracture with routine healing: Secondary | ICD-10-CM | POA: Diagnosis not present

## 2023-04-03 DIAGNOSIS — G5621 Lesion of ulnar nerve, right upper limb: Secondary | ICD-10-CM | POA: Diagnosis not present

## 2023-04-03 DIAGNOSIS — R911 Solitary pulmonary nodule: Secondary | ICD-10-CM | POA: Diagnosis not present

## 2023-04-03 DIAGNOSIS — S72141D Displaced intertrochanteric fracture of right femur, subsequent encounter for closed fracture with routine healing: Secondary | ICD-10-CM | POA: Diagnosis not present

## 2023-04-03 DIAGNOSIS — M9701XD Periprosthetic fracture around internal prosthetic right hip joint, subsequent encounter: Secondary | ICD-10-CM | POA: Diagnosis not present

## 2023-04-03 DIAGNOSIS — Z7902 Long term (current) use of antithrombotics/antiplatelets: Secondary | ICD-10-CM | POA: Diagnosis not present

## 2023-04-03 DIAGNOSIS — M1990 Unspecified osteoarthritis, unspecified site: Secondary | ICD-10-CM | POA: Diagnosis not present

## 2023-04-03 DIAGNOSIS — I739 Peripheral vascular disease, unspecified: Secondary | ICD-10-CM | POA: Diagnosis not present

## 2023-04-03 DIAGNOSIS — N183 Chronic kidney disease, stage 3 unspecified: Secondary | ICD-10-CM | POA: Diagnosis not present

## 2023-04-03 DIAGNOSIS — M48061 Spinal stenosis, lumbar region without neurogenic claudication: Secondary | ICD-10-CM | POA: Diagnosis not present

## 2023-04-03 DIAGNOSIS — I088 Other rheumatic multiple valve diseases: Secondary | ICD-10-CM | POA: Diagnosis not present

## 2023-04-03 DIAGNOSIS — S32039D Unspecified fracture of third lumbar vertebra, subsequent encounter for fracture with routine healing: Secondary | ICD-10-CM | POA: Diagnosis not present

## 2023-04-03 DIAGNOSIS — Z466 Encounter for fitting and adjustment of urinary device: Secondary | ICD-10-CM | POA: Diagnosis not present

## 2023-04-03 DIAGNOSIS — I5042 Chronic combined systolic (congestive) and diastolic (congestive) heart failure: Secondary | ICD-10-CM | POA: Diagnosis not present

## 2023-04-03 DIAGNOSIS — H548 Legal blindness, as defined in USA: Secondary | ICD-10-CM | POA: Diagnosis not present

## 2023-04-03 DIAGNOSIS — G8929 Other chronic pain: Secondary | ICD-10-CM | POA: Diagnosis not present

## 2023-04-03 DIAGNOSIS — K219 Gastro-esophageal reflux disease without esophagitis: Secondary | ICD-10-CM | POA: Diagnosis not present

## 2023-04-03 DIAGNOSIS — M5417 Radiculopathy, lumbosacral region: Secondary | ICD-10-CM | POA: Diagnosis not present

## 2023-04-03 DIAGNOSIS — H409 Unspecified glaucoma: Secondary | ICD-10-CM | POA: Diagnosis not present

## 2023-04-03 DIAGNOSIS — M5441 Lumbago with sciatica, right side: Secondary | ICD-10-CM | POA: Diagnosis not present

## 2023-04-03 DIAGNOSIS — M069 Rheumatoid arthritis, unspecified: Secondary | ICD-10-CM | POA: Diagnosis not present

## 2023-04-03 DIAGNOSIS — I130 Hypertensive heart and chronic kidney disease with heart failure and stage 1 through stage 4 chronic kidney disease, or unspecified chronic kidney disease: Secondary | ICD-10-CM | POA: Diagnosis not present

## 2023-04-06 DIAGNOSIS — I130 Hypertensive heart and chronic kidney disease with heart failure and stage 1 through stage 4 chronic kidney disease, or unspecified chronic kidney disease: Secondary | ICD-10-CM | POA: Diagnosis not present

## 2023-04-06 DIAGNOSIS — M1990 Unspecified osteoarthritis, unspecified site: Secondary | ICD-10-CM | POA: Diagnosis not present

## 2023-04-06 DIAGNOSIS — H548 Legal blindness, as defined in USA: Secondary | ICD-10-CM | POA: Diagnosis not present

## 2023-04-06 DIAGNOSIS — M5441 Lumbago with sciatica, right side: Secondary | ICD-10-CM | POA: Diagnosis not present

## 2023-04-06 DIAGNOSIS — M9701XD Periprosthetic fracture around internal prosthetic right hip joint, subsequent encounter: Secondary | ICD-10-CM | POA: Diagnosis not present

## 2023-04-06 DIAGNOSIS — K219 Gastro-esophageal reflux disease without esophagitis: Secondary | ICD-10-CM | POA: Diagnosis not present

## 2023-04-06 DIAGNOSIS — Z466 Encounter for fitting and adjustment of urinary device: Secondary | ICD-10-CM | POA: Diagnosis not present

## 2023-04-06 DIAGNOSIS — R911 Solitary pulmonary nodule: Secondary | ICD-10-CM | POA: Diagnosis not present

## 2023-04-06 DIAGNOSIS — M069 Rheumatoid arthritis, unspecified: Secondary | ICD-10-CM | POA: Diagnosis not present

## 2023-04-06 DIAGNOSIS — I088 Other rheumatic multiple valve diseases: Secondary | ICD-10-CM | POA: Diagnosis not present

## 2023-04-06 DIAGNOSIS — G8929 Other chronic pain: Secondary | ICD-10-CM | POA: Diagnosis not present

## 2023-04-06 DIAGNOSIS — I5042 Chronic combined systolic (congestive) and diastolic (congestive) heart failure: Secondary | ICD-10-CM | POA: Diagnosis not present

## 2023-04-06 DIAGNOSIS — G5621 Lesion of ulnar nerve, right upper limb: Secondary | ICD-10-CM | POA: Diagnosis not present

## 2023-04-06 DIAGNOSIS — M5417 Radiculopathy, lumbosacral region: Secondary | ICD-10-CM | POA: Diagnosis not present

## 2023-04-06 DIAGNOSIS — I739 Peripheral vascular disease, unspecified: Secondary | ICD-10-CM | POA: Diagnosis not present

## 2023-04-06 DIAGNOSIS — S32039D Unspecified fracture of third lumbar vertebra, subsequent encounter for fracture with routine healing: Secondary | ICD-10-CM | POA: Diagnosis not present

## 2023-04-06 DIAGNOSIS — H409 Unspecified glaucoma: Secondary | ICD-10-CM | POA: Diagnosis not present

## 2023-04-06 DIAGNOSIS — Z7902 Long term (current) use of antithrombotics/antiplatelets: Secondary | ICD-10-CM | POA: Diagnosis not present

## 2023-04-06 DIAGNOSIS — M48061 Spinal stenosis, lumbar region without neurogenic claudication: Secondary | ICD-10-CM | POA: Diagnosis not present

## 2023-04-06 DIAGNOSIS — N183 Chronic kidney disease, stage 3 unspecified: Secondary | ICD-10-CM | POA: Diagnosis not present

## 2023-04-06 DIAGNOSIS — S72141D Displaced intertrochanteric fracture of right femur, subsequent encounter for closed fracture with routine healing: Secondary | ICD-10-CM | POA: Diagnosis not present

## 2023-04-08 DIAGNOSIS — Z743 Need for continuous supervision: Secondary | ICD-10-CM | POA: Diagnosis not present

## 2023-04-08 DIAGNOSIS — I5023 Acute on chronic systolic (congestive) heart failure: Secondary | ICD-10-CM | POA: Diagnosis not present

## 2023-04-08 DIAGNOSIS — T83511A Infection and inflammatory reaction due to indwelling urethral catheter, initial encounter: Secondary | ICD-10-CM | POA: Diagnosis not present

## 2023-04-08 DIAGNOSIS — R918 Other nonspecific abnormal finding of lung field: Secondary | ICD-10-CM | POA: Diagnosis not present

## 2023-04-08 DIAGNOSIS — Z7902 Long term (current) use of antithrombotics/antiplatelets: Secondary | ICD-10-CM | POA: Diagnosis not present

## 2023-04-08 DIAGNOSIS — R6520 Severe sepsis without septic shock: Secondary | ICD-10-CM | POA: Diagnosis not present

## 2023-04-08 DIAGNOSIS — I10 Essential (primary) hypertension: Secondary | ICD-10-CM | POA: Diagnosis not present

## 2023-04-08 DIAGNOSIS — Z515 Encounter for palliative care: Secondary | ICD-10-CM | POA: Diagnosis not present

## 2023-04-08 DIAGNOSIS — R6521 Severe sepsis with septic shock: Secondary | ICD-10-CM | POA: Diagnosis not present

## 2023-04-08 DIAGNOSIS — N200 Calculus of kidney: Secondary | ICD-10-CM | POA: Diagnosis not present

## 2023-04-08 DIAGNOSIS — R509 Fever, unspecified: Secondary | ICD-10-CM | POA: Diagnosis not present

## 2023-04-08 DIAGNOSIS — J9602 Acute respiratory failure with hypercapnia: Secondary | ICD-10-CM | POA: Diagnosis not present

## 2023-04-08 DIAGNOSIS — Z7409 Other reduced mobility: Secondary | ICD-10-CM | POA: Diagnosis not present

## 2023-04-08 DIAGNOSIS — N3090 Cystitis, unspecified without hematuria: Secondary | ICD-10-CM | POA: Diagnosis not present

## 2023-04-08 DIAGNOSIS — Z886 Allergy status to analgesic agent status: Secondary | ICD-10-CM | POA: Diagnosis not present

## 2023-04-08 DIAGNOSIS — J811 Chronic pulmonary edema: Secondary | ICD-10-CM | POA: Diagnosis not present

## 2023-04-08 DIAGNOSIS — I5020 Unspecified systolic (congestive) heart failure: Secondary | ICD-10-CM | POA: Diagnosis not present

## 2023-04-08 DIAGNOSIS — N179 Acute kidney failure, unspecified: Secondary | ICD-10-CM | POA: Diagnosis not present

## 2023-04-08 DIAGNOSIS — E872 Acidosis, unspecified: Secondary | ICD-10-CM | POA: Diagnosis not present

## 2023-04-08 DIAGNOSIS — R000 Tachycardia, unspecified: Secondary | ICD-10-CM | POA: Diagnosis not present

## 2023-04-08 DIAGNOSIS — I5A Non-ischemic myocardial injury (non-traumatic): Secondary | ICD-10-CM | POA: Diagnosis not present

## 2023-04-08 DIAGNOSIS — N136 Pyonephrosis: Secondary | ICD-10-CM | POA: Diagnosis not present

## 2023-04-08 DIAGNOSIS — I499 Cardiac arrhythmia, unspecified: Secondary | ICD-10-CM | POA: Diagnosis not present

## 2023-04-08 DIAGNOSIS — G9341 Metabolic encephalopathy: Secondary | ICD-10-CM | POA: Diagnosis not present

## 2023-04-08 DIAGNOSIS — G629 Polyneuropathy, unspecified: Secondary | ICD-10-CM | POA: Diagnosis not present

## 2023-04-08 DIAGNOSIS — I871 Compression of vein: Secondary | ICD-10-CM | POA: Diagnosis not present

## 2023-04-08 DIAGNOSIS — I110 Hypertensive heart disease with heart failure: Secondary | ICD-10-CM | POA: Diagnosis not present

## 2023-04-08 DIAGNOSIS — Z8673 Personal history of transient ischemic attack (TIA), and cerebral infarction without residual deficits: Secondary | ICD-10-CM | POA: Diagnosis not present

## 2023-04-08 DIAGNOSIS — Z452 Encounter for adjustment and management of vascular access device: Secondary | ICD-10-CM | POA: Diagnosis not present

## 2023-04-08 DIAGNOSIS — I679 Cerebrovascular disease, unspecified: Secondary | ICD-10-CM | POA: Diagnosis not present

## 2023-04-08 DIAGNOSIS — Z8551 Personal history of malignant neoplasm of bladder: Secondary | ICD-10-CM | POA: Diagnosis not present

## 2023-04-08 DIAGNOSIS — R0602 Shortness of breath: Secondary | ICD-10-CM | POA: Diagnosis not present

## 2023-04-08 DIAGNOSIS — M069 Rheumatoid arthritis, unspecified: Secondary | ICD-10-CM | POA: Diagnosis not present

## 2023-04-08 DIAGNOSIS — Z79899 Other long term (current) drug therapy: Secondary | ICD-10-CM | POA: Diagnosis not present

## 2023-04-08 DIAGNOSIS — I4891 Unspecified atrial fibrillation: Secondary | ICD-10-CM | POA: Diagnosis not present

## 2023-04-08 DIAGNOSIS — J9601 Acute respiratory failure with hypoxia: Secondary | ICD-10-CM | POA: Diagnosis not present

## 2023-04-08 DIAGNOSIS — Z993 Dependence on wheelchair: Secondary | ICD-10-CM | POA: Diagnosis not present

## 2023-04-08 DIAGNOSIS — I739 Peripheral vascular disease, unspecified: Secondary | ICD-10-CM | POA: Diagnosis not present

## 2023-04-08 DIAGNOSIS — D649 Anemia, unspecified: Secondary | ICD-10-CM | POA: Diagnosis not present

## 2023-04-08 DIAGNOSIS — J439 Emphysema, unspecified: Secondary | ICD-10-CM | POA: Diagnosis not present

## 2023-04-08 DIAGNOSIS — I959 Hypotension, unspecified: Secondary | ICD-10-CM | POA: Diagnosis not present

## 2023-04-08 DIAGNOSIS — A419 Sepsis, unspecified organism: Secondary | ICD-10-CM | POA: Diagnosis not present

## 2023-04-08 DIAGNOSIS — D638 Anemia in other chronic diseases classified elsewhere: Secondary | ICD-10-CM | POA: Diagnosis not present

## 2023-04-08 DIAGNOSIS — I493 Ventricular premature depolarization: Secondary | ICD-10-CM | POA: Diagnosis not present

## 2023-04-08 DIAGNOSIS — R6889 Other general symptoms and signs: Secondary | ICD-10-CM | POA: Diagnosis not present

## 2023-04-08 DIAGNOSIS — I509 Heart failure, unspecified: Secondary | ICD-10-CM | POA: Diagnosis not present

## 2023-04-08 DIAGNOSIS — Z66 Do not resuscitate: Secondary | ICD-10-CM | POA: Diagnosis not present

## 2023-04-08 DIAGNOSIS — Z79631 Long term (current) use of antimetabolite agent: Secondary | ICD-10-CM | POA: Diagnosis not present

## 2023-04-08 DIAGNOSIS — E8721 Acute metabolic acidosis: Secondary | ICD-10-CM | POA: Diagnosis not present

## 2023-05-04 DEATH — deceased
